# Patient Record
Sex: Female | Born: 1935
Health system: Southern US, Community
[De-identification: ages and names within clinical notes are randomized; demographics above are authoritative.]

## PROBLEM LIST (undated history)

## (undated) DIAGNOSIS — J189 Pneumonia, unspecified organism: Secondary | ICD-10-CM

## (undated) DIAGNOSIS — I495 Sick sinus syndrome: Secondary | ICD-10-CM

## (undated) DIAGNOSIS — R609 Edema, unspecified: Secondary | ICD-10-CM

## (undated) DIAGNOSIS — M1990 Unspecified osteoarthritis, unspecified site: Secondary | ICD-10-CM

## (undated) DIAGNOSIS — I82409 Acute embolism and thrombosis of unspecified deep veins of unspecified lower extremity: Secondary | ICD-10-CM

## (undated) DIAGNOSIS — Z950 Presence of cardiac pacemaker: Secondary | ICD-10-CM

## (undated) DIAGNOSIS — F329 Major depressive disorder, single episode, unspecified: Secondary | ICD-10-CM

## (undated) DIAGNOSIS — F32A Depression, unspecified: Secondary | ICD-10-CM

## (undated) DIAGNOSIS — I639 Cerebral infarction, unspecified: Secondary | ICD-10-CM

## (undated) DIAGNOSIS — I10 Essential (primary) hypertension: Secondary | ICD-10-CM

## (undated) DIAGNOSIS — I480 Paroxysmal atrial fibrillation: Secondary | ICD-10-CM

## (undated) DIAGNOSIS — I340 Nonrheumatic mitral (valve) insufficiency: Secondary | ICD-10-CM

## (undated) HISTORY — DX: Cerebral infarction, unspecified: I63.9

## (undated) HISTORY — DX: Depression, unspecified: F32.A

## (undated) HISTORY — DX: Sick sinus syndrome: I49.5

## (undated) HISTORY — DX: Major depressive disorder, single episode, unspecified: F32.9

## (undated) HISTORY — DX: Acute embolism and thrombosis of unspecified deep veins of unspecified lower extremity: I82.409

## (undated) HISTORY — DX: Essential (primary) hypertension: I10

## (undated) HISTORY — DX: Edema, unspecified: R60.9

## (undated) HISTORY — PX: TONSILLECTOMY: SUR1361

## (undated) HISTORY — DX: Paroxysmal atrial fibrillation: I48.0

---

## 1998-08-18 ENCOUNTER — Other Ambulatory Visit: Admission: RE | Admit: 1998-08-18 | Discharge: 1998-08-18 | Payer: Self-pay | Admitting: Gynecology

## 1999-08-23 ENCOUNTER — Other Ambulatory Visit: Admission: RE | Admit: 1999-08-23 | Discharge: 1999-08-23 | Payer: Self-pay | Admitting: Gynecology

## 2000-08-23 ENCOUNTER — Other Ambulatory Visit: Admission: RE | Admit: 2000-08-23 | Discharge: 2000-08-23 | Payer: Self-pay | Admitting: Gynecology

## 2001-09-12 ENCOUNTER — Other Ambulatory Visit: Admission: RE | Admit: 2001-09-12 | Discharge: 2001-09-12 | Payer: Self-pay | Admitting: Gynecology

## 2002-09-15 ENCOUNTER — Other Ambulatory Visit: Admission: RE | Admit: 2002-09-15 | Discharge: 2002-09-15 | Payer: Self-pay | Admitting: Gynecology

## 2004-09-21 ENCOUNTER — Ambulatory Visit (HOSPITAL_COMMUNITY): Admission: RE | Admit: 2004-09-21 | Discharge: 2004-09-21 | Payer: Self-pay | Admitting: Cardiology

## 2004-09-30 ENCOUNTER — Ambulatory Visit: Payer: Self-pay | Admitting: Pulmonary Disease

## 2004-10-12 ENCOUNTER — Other Ambulatory Visit: Admission: RE | Admit: 2004-10-12 | Discharge: 2004-10-12 | Payer: Self-pay | Admitting: Gynecology

## 2004-10-14 ENCOUNTER — Ambulatory Visit: Payer: Self-pay | Admitting: Pulmonary Disease

## 2004-11-02 ENCOUNTER — Ambulatory Visit: Payer: Self-pay | Admitting: Pulmonary Disease

## 2005-08-24 ENCOUNTER — Inpatient Hospital Stay (HOSPITAL_COMMUNITY): Admission: AD | Admit: 2005-08-24 | Discharge: 2005-08-29 | Payer: Self-pay | Admitting: Cardiology

## 2005-09-07 ENCOUNTER — Ambulatory Visit: Payer: Self-pay | Admitting: Internal Medicine

## 2005-09-12 ENCOUNTER — Ambulatory Visit (HOSPITAL_COMMUNITY): Admission: RE | Admit: 2005-09-12 | Discharge: 2005-09-12 | Payer: Self-pay | Admitting: Internal Medicine

## 2005-09-14 ENCOUNTER — Ambulatory Visit (HOSPITAL_COMMUNITY): Admission: RE | Admit: 2005-09-14 | Discharge: 2005-09-15 | Payer: Self-pay | Admitting: Internal Medicine

## 2005-09-14 ENCOUNTER — Ambulatory Visit: Payer: Self-pay | Admitting: Internal Medicine

## 2005-09-14 HISTORY — PX: PACEMAKER INSERTION: SHX728

## 2005-09-18 ENCOUNTER — Ambulatory Visit: Payer: Self-pay

## 2005-10-02 ENCOUNTER — Ambulatory Visit: Payer: Self-pay

## 2005-10-25 ENCOUNTER — Ambulatory Visit: Payer: Self-pay | Admitting: Cardiology

## 2006-01-09 ENCOUNTER — Ambulatory Visit: Payer: Self-pay | Admitting: Internal Medicine

## 2006-01-17 ENCOUNTER — Ambulatory Visit: Payer: Self-pay | Admitting: Internal Medicine

## 2006-05-18 ENCOUNTER — Ambulatory Visit: Payer: Self-pay | Admitting: Cardiovascular Disease

## 2006-05-25 ENCOUNTER — Ambulatory Visit: Payer: Self-pay | Admitting: Internal Medicine

## 2006-05-29 ENCOUNTER — Ambulatory Visit: Payer: Self-pay

## 2006-05-29 ENCOUNTER — Ambulatory Visit: Payer: Self-pay | Admitting: Internal Medicine

## 2006-06-08 ENCOUNTER — Ambulatory Visit: Payer: Self-pay | Admitting: Cardiology

## 2006-06-22 ENCOUNTER — Ambulatory Visit: Payer: Self-pay | Admitting: Cardiology

## 2006-07-06 ENCOUNTER — Encounter: Admission: RE | Admit: 2006-07-06 | Discharge: 2006-07-06 | Payer: Self-pay | Admitting: Internal Medicine

## 2006-07-09 ENCOUNTER — Ambulatory Visit: Payer: Self-pay | Admitting: Cardiovascular Disease

## 2006-08-01 ENCOUNTER — Ambulatory Visit: Payer: Self-pay | Admitting: *Deleted

## 2006-09-03 ENCOUNTER — Ambulatory Visit: Payer: Self-pay | Admitting: Cardiovascular Disease

## 2006-09-17 ENCOUNTER — Ambulatory Visit: Payer: Self-pay | Admitting: Cardiovascular Disease

## 2006-10-08 ENCOUNTER — Ambulatory Visit: Payer: Self-pay | Admitting: Cardiovascular Disease

## 2006-10-30 ENCOUNTER — Other Ambulatory Visit: Admission: RE | Admit: 2006-10-30 | Discharge: 2006-10-30 | Payer: Self-pay | Admitting: Gynecology

## 2006-11-01 ENCOUNTER — Ambulatory Visit: Payer: Self-pay | Admitting: Cardiovascular Disease

## 2006-11-29 ENCOUNTER — Ambulatory Visit: Payer: Self-pay | Admitting: Cardiology

## 2006-12-28 ENCOUNTER — Ambulatory Visit: Payer: Self-pay | Admitting: Internal Medicine

## 2007-01-23 ENCOUNTER — Ambulatory Visit: Payer: Self-pay | Admitting: Internal Medicine

## 2007-02-20 ENCOUNTER — Ambulatory Visit: Payer: Self-pay | Admitting: Cardiovascular Disease

## 2007-03-21 ENCOUNTER — Ambulatory Visit: Payer: Self-pay | Admitting: Cardiology

## 2007-04-03 ENCOUNTER — Ambulatory Visit: Payer: Self-pay | Admitting: *Deleted

## 2007-04-17 ENCOUNTER — Ambulatory Visit: Payer: Self-pay | Admitting: Cardiology

## 2007-05-01 ENCOUNTER — Ambulatory Visit: Payer: Self-pay | Admitting: Cardiology

## 2007-05-07 ENCOUNTER — Ambulatory Visit: Payer: Self-pay | Admitting: Cardiovascular Disease

## 2007-05-22 ENCOUNTER — Ambulatory Visit: Payer: Self-pay | Admitting: Internal Medicine

## 2007-05-22 ENCOUNTER — Ambulatory Visit: Payer: Self-pay

## 2007-06-19 ENCOUNTER — Ambulatory Visit: Payer: Self-pay | Admitting: Cardiology

## 2007-07-17 ENCOUNTER — Ambulatory Visit: Payer: Self-pay | Admitting: Cardiology

## 2007-08-07 ENCOUNTER — Ambulatory Visit: Payer: Self-pay | Admitting: Cardiology

## 2007-08-21 ENCOUNTER — Ambulatory Visit: Payer: Self-pay | Admitting: Internal Medicine

## 2007-09-04 ENCOUNTER — Ambulatory Visit: Payer: Self-pay | Admitting: Internal Medicine

## 2007-09-13 ENCOUNTER — Ambulatory Visit: Payer: Self-pay | Admitting: Cardiology

## 2007-09-13 ENCOUNTER — Ambulatory Visit: Payer: Self-pay

## 2007-10-02 ENCOUNTER — Ambulatory Visit: Payer: Self-pay | Admitting: Cardiology

## 2007-10-30 ENCOUNTER — Ambulatory Visit: Payer: Self-pay | Admitting: Cardiology

## 2007-11-13 ENCOUNTER — Ambulatory Visit: Payer: Self-pay | Admitting: Cardiovascular Disease

## 2007-11-18 ENCOUNTER — Ambulatory Visit: Payer: Self-pay | Admitting: Cardiology

## 2007-12-09 ENCOUNTER — Ambulatory Visit: Payer: Self-pay | Admitting: Cardiology

## 2008-01-06 ENCOUNTER — Ambulatory Visit: Payer: Self-pay | Admitting: Internal Medicine

## 2008-01-20 ENCOUNTER — Ambulatory Visit: Payer: Self-pay | Admitting: Cardiology

## 2008-01-31 ENCOUNTER — Ambulatory Visit: Payer: Self-pay | Admitting: Cardiovascular Disease

## 2008-02-11 ENCOUNTER — Ambulatory Visit: Payer: Self-pay

## 2008-02-11 ENCOUNTER — Encounter: Payer: Self-pay | Admitting: Cardiovascular Disease

## 2008-02-17 ENCOUNTER — Ambulatory Visit: Payer: Self-pay | Admitting: Cardiology

## 2008-02-26 ENCOUNTER — Ambulatory Visit: Payer: Self-pay | Admitting: Internal Medicine

## 2008-03-23 ENCOUNTER — Ambulatory Visit: Payer: Self-pay | Admitting: Cardiovascular Disease

## 2008-04-03 ENCOUNTER — Ambulatory Visit: Payer: Self-pay | Admitting: Cardiology

## 2008-04-21 ENCOUNTER — Ambulatory Visit: Payer: Self-pay | Admitting: Cardiology

## 2008-05-04 ENCOUNTER — Ambulatory Visit: Payer: Self-pay | Admitting: Cardiology

## 2008-05-18 ENCOUNTER — Ambulatory Visit: Payer: Self-pay | Admitting: Internal Medicine

## 2008-06-01 ENCOUNTER — Ambulatory Visit: Payer: Self-pay | Admitting: Internal Medicine

## 2008-06-22 ENCOUNTER — Ambulatory Visit: Payer: Self-pay | Admitting: Cardiovascular Disease

## 2008-07-13 ENCOUNTER — Ambulatory Visit: Payer: Self-pay | Admitting: Cardiology

## 2008-08-10 ENCOUNTER — Ambulatory Visit: Payer: Self-pay | Admitting: Cardiology

## 2008-08-19 ENCOUNTER — Encounter: Payer: Self-pay | Admitting: Cardiovascular Disease

## 2008-08-19 ENCOUNTER — Ambulatory Visit: Payer: Self-pay

## 2008-08-24 ENCOUNTER — Ambulatory Visit: Payer: Self-pay | Admitting: Cardiovascular Disease

## 2008-08-31 ENCOUNTER — Ambulatory Visit: Payer: Self-pay

## 2008-09-07 ENCOUNTER — Ambulatory Visit: Payer: Self-pay | Admitting: Cardiology

## 2008-10-05 ENCOUNTER — Ambulatory Visit: Payer: Self-pay | Admitting: Cardiology

## 2008-11-02 ENCOUNTER — Ambulatory Visit: Payer: Self-pay | Admitting: Cardiovascular Disease

## 2008-11-30 ENCOUNTER — Ambulatory Visit: Payer: Self-pay | Admitting: Internal Medicine

## 2008-12-28 ENCOUNTER — Ambulatory Visit: Payer: Self-pay | Admitting: Cardiology

## 2009-01-25 ENCOUNTER — Ambulatory Visit: Payer: Self-pay | Admitting: Cardiology

## 2009-02-17 ENCOUNTER — Ambulatory Visit: Payer: Self-pay | Admitting: Cardiovascular Disease

## 2009-02-17 DIAGNOSIS — I080 Rheumatic disorders of both mitral and aortic valves: Secondary | ICD-10-CM

## 2009-02-17 DIAGNOSIS — I10 Essential (primary) hypertension: Secondary | ICD-10-CM | POA: Insufficient documentation

## 2009-02-17 DIAGNOSIS — R609 Edema, unspecified: Secondary | ICD-10-CM

## 2009-02-17 DIAGNOSIS — I4891 Unspecified atrial fibrillation: Secondary | ICD-10-CM | POA: Insufficient documentation

## 2009-02-17 DIAGNOSIS — I4819 Other persistent atrial fibrillation: Secondary | ICD-10-CM | POA: Insufficient documentation

## 2009-02-17 DIAGNOSIS — I82409 Acute embolism and thrombosis of unspecified deep veins of unspecified lower extremity: Secondary | ICD-10-CM | POA: Insufficient documentation

## 2009-02-17 DIAGNOSIS — I495 Sick sinus syndrome: Secondary | ICD-10-CM | POA: Insufficient documentation

## 2009-02-22 ENCOUNTER — Ambulatory Visit: Payer: Self-pay | Admitting: Cardiology

## 2009-03-05 ENCOUNTER — Encounter (INDEPENDENT_AMBULATORY_CARE_PROVIDER_SITE_OTHER): Payer: Self-pay

## 2009-03-08 ENCOUNTER — Ambulatory Visit: Payer: Self-pay | Admitting: Internal Medicine

## 2009-03-18 ENCOUNTER — Ambulatory Visit: Payer: Self-pay | Admitting: Internal Medicine

## 2009-04-01 ENCOUNTER — Ambulatory Visit: Payer: Self-pay | Admitting: Internal Medicine

## 2009-04-27 ENCOUNTER — Encounter: Payer: Self-pay | Admitting: *Deleted

## 2009-04-29 ENCOUNTER — Ambulatory Visit: Payer: Self-pay | Admitting: Internal Medicine

## 2009-04-29 ENCOUNTER — Encounter (INDEPENDENT_AMBULATORY_CARE_PROVIDER_SITE_OTHER): Payer: Self-pay | Admitting: Cardiology

## 2009-04-29 LAB — CONVERTED CEMR LAB
POC INR: 3.4
Protime: 22.2

## 2009-05-24 ENCOUNTER — Ambulatory Visit: Payer: Self-pay | Admitting: Cardiology

## 2009-05-24 LAB — CONVERTED CEMR LAB
POC INR: 2.1
Prothrombin Time: 17.8 s

## 2009-06-02 ENCOUNTER — Encounter: Payer: Self-pay | Admitting: *Deleted

## 2009-06-22 ENCOUNTER — Ambulatory Visit: Payer: Self-pay | Admitting: Internal Medicine

## 2009-06-22 LAB — CONVERTED CEMR LAB
POC INR: 3.6
Prothrombin Time: 22.9 s

## 2009-07-06 ENCOUNTER — Ambulatory Visit: Payer: Self-pay | Admitting: Internal Medicine

## 2009-07-06 LAB — CONVERTED CEMR LAB
POC INR: 2.4
Prothrombin Time: 19 s

## 2009-07-28 ENCOUNTER — Ambulatory Visit: Payer: Self-pay | Admitting: Internal Medicine

## 2009-07-28 LAB — CONVERTED CEMR LAB: POC INR: 2.7

## 2009-08-12 ENCOUNTER — Ambulatory Visit: Payer: Self-pay | Admitting: Internal Medicine

## 2009-08-12 DIAGNOSIS — Z950 Presence of cardiac pacemaker: Secondary | ICD-10-CM | POA: Insufficient documentation

## 2009-09-09 ENCOUNTER — Telehealth: Payer: Self-pay | Admitting: Cardiology

## 2009-09-10 ENCOUNTER — Ambulatory Visit: Payer: Self-pay | Admitting: Internal Medicine

## 2009-09-10 LAB — CONVERTED CEMR LAB: POC INR: 1.9

## 2009-09-24 ENCOUNTER — Ambulatory Visit: Payer: Self-pay | Admitting: Internal Medicine

## 2009-09-24 LAB — CONVERTED CEMR LAB: POC INR: 2.2

## 2009-10-19 ENCOUNTER — Ambulatory Visit: Payer: Self-pay | Admitting: Cardiovascular Disease

## 2009-10-19 LAB — CONVERTED CEMR LAB: POC INR: 2.7

## 2009-11-16 ENCOUNTER — Ambulatory Visit: Payer: Self-pay | Admitting: Internal Medicine

## 2009-11-16 LAB — CONVERTED CEMR LAB: POC INR: 2.6

## 2009-11-30 ENCOUNTER — Encounter: Payer: Self-pay | Admitting: Cardiovascular Disease

## 2009-12-01 ENCOUNTER — Encounter: Payer: Self-pay | Admitting: Cardiovascular Disease

## 2009-12-08 ENCOUNTER — Encounter: Payer: Self-pay | Admitting: Cardiovascular Disease

## 2009-12-14 ENCOUNTER — Ambulatory Visit: Payer: Self-pay | Admitting: Cardiology

## 2009-12-14 LAB — CONVERTED CEMR LAB: POC INR: 2

## 2009-12-22 ENCOUNTER — Encounter (INDEPENDENT_AMBULATORY_CARE_PROVIDER_SITE_OTHER): Payer: Self-pay | Admitting: *Deleted

## 2010-01-11 ENCOUNTER — Ambulatory Visit: Payer: Self-pay | Admitting: Internal Medicine

## 2010-01-11 LAB — CONVERTED CEMR LAB: POC INR: 2.7

## 2010-02-08 ENCOUNTER — Ambulatory Visit: Payer: Self-pay | Admitting: Cardiology

## 2010-02-08 LAB — CONVERTED CEMR LAB: POC INR: 2.5

## 2010-02-15 ENCOUNTER — Encounter: Payer: Self-pay | Admitting: Internal Medicine

## 2010-02-15 ENCOUNTER — Ambulatory Visit: Payer: Self-pay | Admitting: Cardiovascular Disease

## 2010-02-15 DIAGNOSIS — E782 Mixed hyperlipidemia: Secondary | ICD-10-CM

## 2010-03-08 ENCOUNTER — Ambulatory Visit: Payer: Self-pay | Admitting: Cardiovascular Disease

## 2010-03-16 ENCOUNTER — Ambulatory Visit: Payer: Self-pay | Admitting: Cardiology

## 2010-03-16 LAB — CONVERTED CEMR LAB: POC INR: 2.5

## 2010-04-12 ENCOUNTER — Ambulatory Visit: Payer: Self-pay | Admitting: Internal Medicine

## 2010-04-12 LAB — CONVERTED CEMR LAB: POC INR: 2.5

## 2010-05-10 ENCOUNTER — Ambulatory Visit: Payer: Self-pay | Admitting: Cardiovascular Disease

## 2010-05-10 LAB — CONVERTED CEMR LAB: POC INR: 3.2

## 2010-06-07 ENCOUNTER — Ambulatory Visit: Payer: Self-pay | Admitting: Cardiovascular Disease

## 2010-06-07 LAB — CONVERTED CEMR LAB: POC INR: 3.1

## 2010-07-05 ENCOUNTER — Ambulatory Visit: Payer: Self-pay | Admitting: Cardiology

## 2010-07-05 LAB — CONVERTED CEMR LAB: POC INR: 3.1

## 2010-07-26 ENCOUNTER — Ambulatory Visit: Payer: Self-pay | Admitting: Cardiovascular Disease

## 2010-07-26 LAB — CONVERTED CEMR LAB: POC INR: 2.4

## 2010-08-16 ENCOUNTER — Ambulatory Visit: Payer: Self-pay | Admitting: Internal Medicine

## 2010-08-23 ENCOUNTER — Ambulatory Visit: Payer: Self-pay | Admitting: Cardiovascular Disease

## 2010-08-23 LAB — CONVERTED CEMR LAB: POC INR: 2.3

## 2010-08-26 ENCOUNTER — Telehealth: Payer: Self-pay | Admitting: Cardiovascular Disease

## 2010-08-26 ENCOUNTER — Encounter: Payer: Self-pay | Admitting: Cardiovascular Disease

## 2010-08-26 DIAGNOSIS — S0990XA Unspecified injury of head, initial encounter: Secondary | ICD-10-CM | POA: Insufficient documentation

## 2010-08-29 ENCOUNTER — Ambulatory Visit: Payer: Self-pay | Admitting: Internal Medicine

## 2010-09-20 ENCOUNTER — Ambulatory Visit: Payer: Self-pay | Admitting: Cardiology

## 2010-09-20 LAB — CONVERTED CEMR LAB: POC INR: 4

## 2010-10-04 ENCOUNTER — Ambulatory Visit: Payer: Self-pay | Admitting: Cardiovascular Disease

## 2010-10-04 ENCOUNTER — Ambulatory Visit: Payer: Self-pay | Admitting: Internal Medicine

## 2010-10-04 LAB — CONVERTED CEMR LAB
ALT: 25 units/L (ref 0–35)
AST: 24 units/L (ref 0–37)
Albumin: 3.9 g/dL (ref 3.5–5.2)
Alkaline Phosphatase: 40 units/L (ref 39–117)
Bilirubin, Direct: 0.1 mg/dL (ref 0.0–0.3)
Cholesterol: 191 mg/dL (ref 0–200)
HDL: 64.9 mg/dL (ref >39.00)
LDL Cholesterol: 104 mg/dL — ABNORMAL HIGH (ref 0–99)
POC INR: 2.3
Total Bilirubin: 0.7 mg/dL (ref 0.3–1.2)
Total CHOL/HDL Ratio: 3
Total Protein: 6 g/dL (ref 6.0–8.3)
Triglycerides: 109 mg/dL (ref 0.0–149.0)
VLDL: 21.8 mg/dL (ref 0.0–40.0)

## 2010-10-12 ENCOUNTER — Telehealth: Payer: Self-pay | Admitting: Cardiovascular Disease

## 2010-11-01 ENCOUNTER — Ambulatory Visit: Payer: Self-pay | Admitting: Cardiovascular Disease

## 2010-11-01 LAB — CONVERTED CEMR LAB: POC INR: 2.1

## 2010-12-02 ENCOUNTER — Ambulatory Visit: Admission: RE | Admit: 2010-12-02 | Discharge: 2010-12-02 | Payer: Self-pay | Source: Home / Self Care

## 2010-12-02 LAB — CONVERTED CEMR LAB: POC INR: 2

## 2010-12-25 LAB — CONVERTED CEMR LAB: Hgb A1c MFr Bld: 5.7 % (ref 4.6–6.5)

## 2010-12-29 NOTE — Letter (Signed)
Summary: Appointment - Reminder [place], [place]  [address] [place]   [place], [place] [address]   Phone: [phone]  Fax: [phone]     December 22, 2009 MRN: [id number]   [facility] [address] [place], [place]  [address]   Dear Ms. Shenberger,  Our records indicate that it is time to schedule a follow-up appointment with Dr. Johnsie Cancel. It is very important that we reach you to schedule this appointment. We look forward to participating in your health care needs. Please contact us at the number listed above at your earliest convenience to schedule your appointment.  If you are unable to make an appointment at this time, give [place] a call so we can update our records.   Sincerely,   Darnell Level [facility] Scheduling Team

## 2010-12-29 NOTE — Cardiovascular Report (Signed)
Summary: Office Visit   Office Visit   Imported By: Sallee Provencal 08/23/2010 10:47:29  _____________________________________________________________________  External Attachment:    Type:   Image     Comment:   External Document

## 2010-12-29 NOTE — Medication Information (Signed)
Summary: rov/tm  Anticoagulant Therapy  Managed by: Gwynneth Albright, PharmD Referring MD: Virl Axe MD Supervising MD: Burt Knack MD, Legrand Como Indication 1: Atrial Fibrillation (ICD-427.31) Lab Used: [place] Site: [facility] INR RANGE 2 - 3  Dietary changes: yes       Details: Pt has had company so has been eating less greens.  Health status changes: no    Bleeding/hemorrhagic complications: no    Recent/future hospitalizations: no    Any changes in medication regimen? yes       Details: Started Atelvia (risedronate). Pt supposed to start Crestor 2.5 mg HS.  Recent/future dental: no  Any missed doses?: no       Is patient compliant with meds? yes       Allergies: 1)  ! Sporanox (Itraconazole)  Anticoagulation Management History:      The patient is taking warfarin and comes in today for a routine follow up visit.  Positive risk factors for bleeding include an age of 75 years or older.  The bleeding index is 'intermediate risk'.  Positive CHADS2 values include History of HTN and Age > 57 years old.  The start date was 05/23/2006.  Anticoagulation responsible provider: Burt Knack MD, Legrand Como.  Cuvette Lot#: [id number].  Exp: 02/2011.    Anticoagulation Management Assessment/Plan:      The patient's current anticoagulation dose is Coumadin 5 mg tabs: Use as directed by anticoagulation clinic.  The target INR is 2.0-3.0.  The next INR is due 03/16/2010.  Anticoagulation instructions were given to patient.  Results were reviewed/authorized by Gwynneth Albright, PharmD.  She was notified by Gwynneth Albright, PharmD.         Prior Anticoagulation Instructions: INR 2.5 Continue 5mg s everyday excpet 2.5mg s on Mondays, Wednesdays and Fridays. Recheck in 4 weeks.   Current Anticoagulation Instructions: INR 4.6. Hold today and tomorrow, then take 1 tablet daily except 0.5 tablet Mon, Wed, Fri. Recheck in 7-10 days.

## 2010-12-29 NOTE — Medication Information (Signed)
Summary: rov/cb  Anticoagulant Therapy  Managed by: Gwynneth Albright, PharmD Referring MD: Virl Axe MD Supervising MD: Aundra Dubin MD, Vencent Hauschild Indication 1: Atrial Fibrillation (ICD-427.31) Lab Used: [place] Site: [facility] INR POC 2.5 INR RANGE 2 - 3  Dietary changes: no    Health status changes: yes       Details: Pt has boil in groin area.  Seeing MD today to have this lanced.  Will call if start antibiotics.  Bleeding/hemorrhagic complications: no    Recent/future hospitalizations: no    Any changes in medication regimen? no    Recent/future dental: no  Any missed doses?: no       Is patient compliant with meds? yes      Comments: Pt took double doses of Coumadin last time for 2 days due to misfilling pill boxes.  Allergies: 1)  ! Sporanox (Itraconazole)  Anticoagulation Management History:      The patient is taking warfarin and comes in today for a routine follow up visit.  Positive risk factors for bleeding include an age of 75 years or older.  The bleeding index is 'intermediate risk'.  Positive CHADS2 values include History of HTN and Age > 29 years old.  The start date was 05/23/2006.  Anticoagulation responsible provider: Aundra Dubin MD, Jacqulyn Barresi.  INR POC: 2.5.  Cuvette Lot#: [id number].  Exp: 03/2011.    Anticoagulation Management Assessment/Plan:      The patient's current anticoagulation dose is Coumadin 5 mg tabs: Use as directed by anticoagulation clinic.  The target INR is 2.0-3.0.  The next INR is due 04/12/2010.  Anticoagulation instructions were given to patient.  Results were reviewed/authorized by Gwynneth Albright, PharmD.  She was notified by Gwynneth Albright, PharmD.         Prior Anticoagulation Instructions: INR 4.6. Hold today and tomorrow, then take 1 tablet daily except 0.5 tablet Mon, Wed, Fri. Recheck in 7-10 days.  Current Anticoagulation Instructions: INR 2.5. Take 1 tablet daily except 0.5 tablet Mon, Wed, Fri.  Appended Document: rov/cb Pt called  to let us know what antibiotic she had started.  She is taking doxycycline 100mg .  Informed pt this does not usually affect INR.  Okay to keep same dose and original appt.

## 2010-12-29 NOTE — Medication Information (Signed)
Summary: rov/tm  Anticoagulant Therapy  Managed by: Belenda Cruise, PharmD Candidate Referring MD: Virl Axe MD Supervising MD: Ron Parker MD, Dellis Filbert Indication 1: Atrial Fibrillation (ICD-427.31) Lab Used: [place] Site: [facility] INR POC 2.0 INR RANGE 2 - 3  Dietary changes: no    Health status changes: no    Bleeding/hemorrhagic complications: no    Recent/future hospitalizations: no    Any changes in medication regimen? no    Recent/future dental: no  Any missed doses?: no       Is patient compliant with meds? yes       Allergies: 1)  ! Sporanox (Itraconazole)  Anticoagulation Management History:      The patient is taking warfarin and comes in today for a routine follow up visit.  Positive risk factors for bleeding include an age of 7 years or older.  The bleeding index is 'intermediate risk'.  Positive CHADS2 values include History of HTN and Age > 26 years old.  The start date was 05/23/2006.  Anticoagulation responsible provider: Ron Parker MD, Dellis Filbert.  INR POC: 2.0.  Cuvette Lot#: [id number].  Exp: 02/2011.    Anticoagulation Management Assessment/Plan:      The patient's current anticoagulation dose is Coumadin 5 mg tabs: Use as directed by anticoagulation clinic.  The target INR is 2.0-3.0.  The next INR is due 01/11/2010.  Anticoagulation instructions were given to patient.  Results were reviewed/authorized by Belenda Cruise, PharmD Candidate.  She was notified by Belenda Cruise, PharmD Candidate.         Prior Anticoagulation Instructions: INR 2.6 Continue 5mg s daily except 2.5mg s on Mondays, Wednesdays and Fridays.  Recheck in 4 weeks.   Current Anticoagulation Instructions: INR 2.0  Continue same dose of 1 tablet daily except 0.5 tablet on Mondays, Wednesdays, and Fridays. Recheck in 4 weeks.

## 2010-12-29 NOTE — Medication Information (Signed)
Summary: rov/ln      Allergies Added:  Anticoagulant Therapy  Managed by: Gypsy Lore, PharmD Referring MD: Virl Axe MD Supervising MD: Verl Blalock MD, Marcello Moores Indication 1: Atrial Fibrillation (ICD-427.31) Lab Used: [facility] Site: [facility] INR POC 3.1 INR RANGE 2 - 3  Dietary changes: no    Health status changes: no    Bleeding/hemorrhagic complications: no    Recent/future hospitalizations: no    Any changes in medication regimen? no    Recent/future dental: no  Any missed doses?: no       Is patient compliant with meds? yes      Comments: Patient hit head last week. Has been dizzy and complains of nauseathis week and has a bruise on her face. Denies vision changes other than "floaters."  BP and HR are normal.  she does report extra stress over the past few months.  She has recently moved and had a close friend die.  She has been working a lot around her house and admits to not staying hydrated.  Suggested she see her opthomologist reguarding vision changes.   Current Medications (verified): 1)  Citalopram Hydrobromide 20 Mg Tabs (Citalopram Hydrobromide) .Marland Kitchen.. 1 Tab By Mouth Once Daily 2)  Propafenone Hcl 225 Mg Tabs (Propafenone Hcl) .Marland Kitchen.. 1 Tab By Mouth Two Times A Day 3)  Lorazepam 0.5 Mg Tabs (Lorazepam) .Marland Kitchen.. 1 Tab As Needed 4)  Vitamin D 1000 Unit  Tabs (Cholecalciferol) .... 2 Tab By Mouth Once Daily 5)  Calcim/mag .Marland Kitchen.. 1 Tab By Mouth Once Daily 6)  Folic Acid   (Folic Acid) .Marland Kitchen.. 1 Tab By Mouth Once Daily 7)  Vitamin C .... 1 Tab By Mouth Two Times A Day 8)  Vitamin B12 .Marland Kitchen.. 1 Tab By Mouth Once Daily 9)  Biotin .Marland Kitchen.. 1 Tab As Needed 10)  Coumadin 5 Mg Tabs (Warfarin Sodium) .... Use As Directed By Anticoagulation Clinic 11)  Diltiazem Hcl 30 Mg Tabs (Diltiazem Hcl) .Marland Kitchen.. 1 To 12 By Mouth Every 6 Hours As Needed 12)  Atelvia 35 Mg Tbec (Risedronate Sodium) .Marland Kitchen.. 1 Tablet By Mouth Weekly  Allergies (verified): 1)  ! Sporanox (Itraconazole)  Anticoagulation  Management History:      The patient is taking warfarin and comes in today for a routine follow up visit.  Positive risk factors for bleeding include an age of 75 years or older.  The bleeding index is 'intermediate risk'.  Positive CHADS2 values include History of HTN and Age > 37 years old.  The start date was 05/23/2006.  Anticoagulation responsible provider: Verl Blalock MD, Marcello Moores.  INR POC: 3.1.  Cuvette Lot#: [id number].  Exp: 07/2011.    Anticoagulation Management Assessment/Plan:      The patient's current anticoagulation dose is Coumadin 5 mg tabs: Use as directed by anticoagulation clinic.  The target INR is 2.0-3.0.  The next INR is due 07/26/2010.  Anticoagulation instructions were given to patient.  Results were reviewed/authorized by Gypsy Lore, PharmD.  She was notified by Gypsy Lore PharmD.         Prior Anticoagulation Instructions: INR 3.1  Skip today's dose and then resume 1 tab (5mg ) on Sunday, Tuesday, Thursday, and Saturday and 0.5 tab (2.5mg ) on Monday, Wednesday, and Friday.  Re-check in 4 weeks.    Current Anticoagulation Instructions: INR 3.1  Take Coumadin 0.5 tab (2.5 mg) tonight.  Then take Coumadin 0.5 tab (2.5 mg) on Mon, Tues, Wed, Fri and Coumadin 1 tab (5 mg) on Sun, Thur, Sat. Return to  clinic in 2 weeks.

## 2010-12-29 NOTE — Medication Information (Signed)
Summary: rov/sp  Anticoagulant Therapy  Managed by: Porfirio Oar, PharmD Referring MD: Virl Axe MD PCP: [name], MD Supervising MD: Harrington Challenger MD, Nevin Bloodgood Indication 1: Atrial Fibrillation (ICD-427.31) Lab Used: [facility] Site: [facility] INR POC 2.0 INR RANGE 2 - 3  Dietary changes: no    Health status changes: no    Bleeding/hemorrhagic complications: no    Recent/future hospitalizations: no    Any changes in medication regimen? yes       Details: Taking an antibiotic(not sure of name) but prescriber knew she was on Coumadin  Recent/future dental: no  Any missed doses?: no       Is patient compliant with meds? yes       Allergies: 1)  ! Sporanox (Itraconazole)  Anticoagulation Management History:      The patient is taking warfarin and comes in today for a routine follow up visit.  Positive risk factors for bleeding include an age of 75 years or older.  The bleeding index is 'intermediate risk'.  Positive CHADS2 values include History of HTN and Age > 75 years old.  The start date was 05/23/2006.  Anticoagulation responsible provider: Harrington Challenger MD, Nevin Bloodgood.  INR POC: 2.0.  Exp: 08/2011.    Anticoagulation Management Assessment/Plan:      The patient's current anticoagulation dose is Coumadin 5 mg tabs: Use as directed by anticoagulation clinic.  The target INR is 2.0-3.0.  The next INR is due 01/03/2011.  Anticoagulation instructions were given to patient.  Results were reviewed/authorized by Porfirio Oar, PharmD.  She was notified by Ernst Bowler, PharmD candidate.         Prior Anticoagulation Instructions: INR 2.1  Continue same dose of 1/2 tablet every day except 1 tablet on Sunday and Thursday.  Recheck INR in 4 weeks.   Current Anticoagulation Instructions: INR 2.0 (INR goal: 2-3)  Take 1 tablet on Sundays, Thursdays, and Saturdays and 1/2 tablet on Mondays, Tuesdays, Wednesdays, and Fridays.

## 2010-12-29 NOTE — Assessment & Plan Note (Signed)
Summary: PC2/DM  Medications Added VITAMIN D3 2000 UNIT CAPS (CHOLECALCIFEROL) once daily DILTIAZEM HCL 30 MG TABS (DILTIAZEM HCL) 1 to 2 by mouth every 6 hours as needed        Visit Type:  Follow-up Primary Provider:  [name], MD   History of Present Illness: [name] [name] seen in followup for paroxysmal atrial fibrillation for which she takes Rythmol and bradycardia for which she is status post pacemaker implantation.   She is on relatively well notwithstanding a great deal of stress related to her moving.  She discussed with Dr. Johnsie Cancel  the role of Pradaxa. His recommendations were not so strongly in favor    Current Medications (verified): 1)  Citalopram Hydrobromide 20 Mg Tabs (Citalopram Hydrobromide) .Marland Kitchen.. 1 Tab By Mouth Once Daily 2)  Propafenone Hcl 225 Mg Tabs (Propafenone Hcl) .Marland Kitchen.. 1 Tab By Mouth Two Times A Day 3)  Lorazepam 0.5 Mg Tabs (Lorazepam) .Marland Kitchen.. 1 Tab As Needed 4)  Vitamin D3 2000 Unit Caps (Cholecalciferol) .... Once Daily 5)  Calcim/mag .Marland Kitchen.. 1 Tab By Mouth Once Daily 6)  Folic Acid   (Folic Acid) .Marland Kitchen.. 1 Tab By Mouth Once Daily 7)  Vitamin C .... 1 Tab By Mouth Two Times A Day 8)  Vitamin B12 .Marland Kitchen.. 1 Tab By Mouth Once Daily 9)  Biotin .Marland Kitchen.. 1 Tab As Needed 10)  Coumadin 5 Mg Tabs (Warfarin Sodium) .... Use As Directed By Anticoagulation Clinic 11)  Diltiazem Hcl 30 Mg Tabs (Diltiazem Hcl) .Marland Kitchen.. 1 To 2 By Mouth Every 6 Hours As Needed 12)  Atelvia 35 Mg Tbec (Risedronate Sodium) .Marland Kitchen.. 1 Tablet By Mouth Weekly  Allergies: 1)  ! Sporanox (Itraconazole)  Past History:  Past Medical History: Last updated: 02/17/2009 Current Problems:  DVT (ICD-453.40) SICK SINUS SYNDROME (ICD-427.81) MITRAL INSUFFICIENCY (ICD-396.3) EDEMA (ICD-782.3) PAROXYSMAL ATRIAL FIBRILLATION (ICD-427.31) HYPERTENSION (ICD-401.9) Elevated INR Valvular heart disease depression  Vital Signs:  Patient profile:   77 year old female Height:      66 inches Weight:      156  pounds BMI:     25.27 Pulse rate:   62 / minute BP sitting:   130 / 88  (left arm)  Vitals Entered By: Margaretmary Bayley CMA (August 16, 2010 11:10 AM)  Physical Exam  General:  The patient was alert and oriented in no acute distress. HEENT Normal.  Neck veins were flat, carotids were brisk.  Lungs were clear.  Heart sounds were regular without murmurs or gallops.  Abdomen was soft with active bowel sounds. There is no clubbing cyanosis or edema. Skin Warm and dry, was some venous discoloration changes    PPM Specifications Following MD:  Virl Axe, MD     Referring MD:  [facility] PPM Vendor:  [employer]     PPM Model Number:  [id number]     PPM Serial Number:  [id number] PPM DOI:  09/14/2005     PPM Implanting MD:  Virl Axe, MD  Lead 1    Location: [facility]     DOI: 09/14/2005     Model #: [id number]     Serial #: [id number]     Status: active Lead 2    Location: RV     DOI: 09/14/2005     Model #: [id number]     Serial #: [id number]     Status: active  Magnet Response Rate:  BOL 85 ERI 65  Indications:  Tachy-Brady Syndrome   PPM Follow Up Battery Voltage:  3.00 V  Pacer Dependent:  No       PPM Device Measurements Atrium  Amplitude: 2.1 mV, Impedance: 624 ohms, Threshold: 1.0 V at 0.4 msec Right Ventricle  Amplitude: 4.7 mV, Impedance: 408 ohms, Threshold: 1.0 V at 0.4 msec  Episodes MS Episodes:  38     Percent Mode Switch:  2.3%     Coumadin:  Yes Ventricular High Rate:  0     Atrial Pacing:  25.0%     Ventricular Pacing:  0.2%  Parameters Mode:  MVP     Lower Rate Limit:  60     Upper Rate Limit:  130 Paced AV Delay:  180     Sensed AV Delay:  150 Next Cardiology Appt Due:  01/30/2011 Tech Comments:  38 AF EPISODES--LONGEST WAS 23 HRS.  NORMAL DEVICE FUNCTION.  NO CHANGES MADE. ROV IN 6 MTHS W/DEVICE CLINIC. Shelly Bombard  August 16, 2010 11:29 AM  Impression & Recommendations:  Problem # 1:  PACEMAKER, PERMANENT (ICD-V45.01) Device parameters and data were reviewed and  no changes were made  Problem # 2:  PAROXYSMAL ATRIAL FIBRILLATION (ICD-427.31) She continues to have episodes of atrial fibrillation. We discussed the potential benefits of Pradaxa. I would be in favor of pursuing a trial of this medication. She will let us know. Her updated medication list for this problem includes:    Propafenone Hcl 225 Mg Tabs (Propafenone hcl) .Marland Kitchen... 1 tab by mouth two times a day    Coumadin 5 Mg Tabs (Warfarin sodium) ..... Use as directed by anticoagulation clinic  Problem # 3:  SICK SINUS SYNDROME (ICD-427.81) stable Her updated medication list for this problem includes:    Propafenone Hcl 225 Mg Tabs (Propafenone hcl) .Marland Kitchen... 1 tab by mouth two times a day    Coumadin 5 Mg Tabs (Warfarin sodium) ..... Use as directed by anticoagulation clinic    Diltiazem Hcl 30 Mg Tabs (Diltiazem hcl) .Marland Kitchen... 1 to 2 by mouth every 6 hours as needed

## 2010-12-29 NOTE — Letter (Signed)
Summary: Georga Kaufmann [place]   Imported By: Mingo Amber Bridgeforth 12/29/2009 14:12:10  _____________________________________________________________________  External Attachment:    Type:   Image     Comment:   External Document

## 2010-12-29 NOTE — Miscellaneous (Signed)
  Clinical Lists Changes  Problems: Added new problem of HEAD TRAUMA, CLOSED (ICD-959.01) Orders: Added new Referral order of CT Scan  (CT Scan) - Signed

## 2010-12-29 NOTE — Medication Information (Signed)
Summary: rov/cb  Anticoagulant Therapy  Managed by: Porfirio Oar, PharmD Referring MD: Virl Axe MD Supervising MD: Caryl Comes MD, Remo Lipps Indication 1: Atrial Fibrillation (ICD-427.31) Lab Used: [place] Site: [facility] INR POC 2.5 INR RANGE 2 - 3  Dietary changes: no    Health status changes: no    Bleeding/hemorrhagic complications: no    Recent/future hospitalizations: no    Any changes in medication regimen? no    Recent/future dental: no  Any missed doses?: no       Is patient compliant with meds? yes       Allergies: 1)  ! Sporanox (Itraconazole)  Anticoagulation Management History:      The patient is taking warfarin and comes in today for a routine follow up visit.  Positive risk factors for bleeding include an age of 75 years or older.  The bleeding index is 'intermediate risk'.  Positive CHADS2 values include History of HTN and Age > 52 years old.  The start date was 05/23/2006.  Anticoagulation responsible provider: Caryl Comes MD, Remo Lipps.  INR POC: 2.5.  Cuvette Lot#: [id number].  Exp: 06/2011.    Anticoagulation Management Assessment/Plan:      The patient's current anticoagulation dose is Coumadin 5 mg tabs: Use as directed by anticoagulation clinic.  The target INR is 2.0-3.0.  The next INR is due 05/10/2010.  Anticoagulation instructions were given to patient.  Results were reviewed/authorized by Porfirio Oar, PharmD.  She was notified by Porfirio Oar PharmD.         Prior Anticoagulation Instructions: INR 2.5. Take 1 tablet daily except 0.5 tablet Mon, Wed, Fri.  Current Anticoagulation Instructions: INR 2.5  Continue same dose of 1 tablet every day except 1/2 tablet on Monday, Wednesday and Friday

## 2010-12-29 NOTE — Medication Information (Signed)
Summary: rov/tm  Anticoagulant Therapy  Managed by: Porfirio Oar, PharmD Referring MD: Virl Axe MD Supervising MD: Angelena Form MD, Harrell Gave Indication 1: Atrial Fibrillation (ICD-427.31) Lab Used: [place] Site: [facility] INR POC 3.1 INR RANGE 2 - 3  Dietary changes: no    Health status changes: yes       Details: suspicious mole, encouraged pt to go to dermatologist to get it checked   Bleeding/hemorrhagic complications: yes       Details: hit left arm and has a large bruise  Recent/future hospitalizations: no    Any changes in medication regimen? no    Recent/future dental: no  Any missed doses?: yes     Details: may have missed one dose  Is patient compliant with meds? yes       Allergies: 1)  ! Sporanox (Itraconazole)  Anticoagulation Management History:      The patient is taking warfarin and comes in today for a routine follow up visit.  Positive risk factors for bleeding include an age of 75 years or older.  The bleeding index is 'intermediate risk'.  Positive CHADS2 values include History of HTN and Age > 75 years old.  The start date was 05/23/2006.  Anticoagulation responsible provider: Angelena Form MD, Harrell Gave.  INR POC: 3.1.  Cuvette Lot#: [id number].  Exp: 07/2011.    Anticoagulation Management Assessment/Plan:      The patient's current anticoagulation dose is Coumadin 5 mg tabs: Use as directed by anticoagulation clinic.  The target INR is 2.0-3.0.  The next INR is due 07/05/2010.  Anticoagulation instructions were given to patient.  Results were reviewed/authorized by Porfirio Oar, PharmD.  She was notified by Lind Covert.         Prior Anticoagulation Instructions: INR 3.2 Skip today's dose and then resume 5mg s everyday except 2.5mg s on Mondays, Wednesdays and Fridays. Recheck in 4 weeks.   Current Anticoagulation Instructions: INR 3.1  Skip today's dose and then resume 1 tab (5mg ) on Sunday, Tuesday, Thursday, and Saturday and 0.5 tab (2.5mg ) on  Monday, Wednesday, and Friday.  Re-check in 4 weeks.

## 2010-12-29 NOTE — Cardiovascular Report (Signed)
Summary: Office Visit   Office Visit   Imported By: Sallee Provencal 03/01/2010 15:49:16  _____________________________________________________________________  External Attachment:    Type:   Image     Comment:   External Document

## 2010-12-29 NOTE — Medication Information (Signed)
Summary: rov/jk   Anticoagulant Therapy  Managed by: Porfirio Oar, PharmD Referring MD: Virl Axe MD PCP: [name], MD Supervising MD: Johnsie Cancel MD, Collier Salina Indication 1: Atrial Fibrillation (ICD-427.31) Lab Used: [facility] Site: [facility] INR POC 2.3 INR RANGE 2 - 3  Dietary changes: no    Health status changes: no    Bleeding/hemorrhagic complications: no    Recent/future hospitalizations: no    Any changes in medication regimen? no    Recent/future dental: no  Any missed doses?: yes     Details: may have missed 1 dose a few weeks ago but unsure  Is patient compliant with meds? yes       Allergies: 1)  ! Sporanox (Itraconazole)  Anticoagulation Management History:      The patient is taking warfarin and comes in today for a routine follow up visit.  Positive risk factors for bleeding include an age of 75 years or older.  The bleeding index is 'intermediate risk'.  Positive CHADS2 values include History of HTN and Age > 56 years old.  The start date was 05/23/2006.  Anticoagulation responsible provider: Johnsie Cancel MD, Collier Salina.  INR POC: 2.3.  Exp: 07/2011.    Anticoagulation Management Assessment/Plan:      The patient's current anticoagulation dose is Coumadin 5 mg tabs: Use as directed by anticoagulation clinic.  The target INR is 2.0-3.0.  The next INR is due 09/20/2010.  Anticoagulation instructions were given to patient.  Results were reviewed/authorized by Porfirio Oar, PharmD.  She was notified by Porfirio Oar PharmD.         Prior Anticoagulation Instructions: INR 2.4  Continue taking 1/2 tablet (2.5mg ) every day except take 1 tablet (5mg ) on Sundays, Thursdays, and Saturdays.  Recheck in 4 weeks.   Current Anticoagulation Instructions: INR 2.3  Continue same dose of 1/2 tablet every day except 1 tablet on Thursday, Saturday and Sunday.  Recheck INR in 4 weeks.

## 2010-12-29 NOTE — Progress Notes (Signed)
Summary: pt out of town req refill asap in a -fib   Phone Note Refill Request Message from:  Patient on October 12, 2010 3:46 PM  Refills Requested: Medication #1:  DILTIAZEM HCL 30 MG TABS 1 to 2 by mouth every 6 hours as needed [phone] [place] pt out of town and in a-fib needs med asap   Method Requested: Telephone to Pharmacy Initial call taken by: Lorenda Hatchet,  October 12, 2010 3:46 PM  Follow-up for Phone Call        sent to pharmacy gave pt refills....called pt no answer so i left message Follow-up by: Burnett Kanaris,  October 12, 2010 4:18 PM

## 2010-12-29 NOTE — Medication Information (Signed)
Summary: rov/nb   Anticoagulant Therapy  Managed by: Porfirio Oar, PharmD Referring MD: Virl Axe MD PCP: [name], MD Supervising MD: Johnsie Cancel MD, Collier Salina Indication 1: Atrial Fibrillation (ICD-427.31) Lab Used: [facility] Site: [facility] INR POC 2.1 INR RANGE 2 - 3  Dietary changes: no    Health status changes: no    Bleeding/hemorrhagic complications: no    Recent/future hospitalizations: no    Any changes in medication regimen? no    Recent/future dental: no  Any missed doses?: no       Is patient compliant with meds? yes       Current Medications (verified): 1)  Citalopram Hydrobromide 20 Mg Tabs (Citalopram Hydrobromide) .Marland Kitchen.. 1 Tab By Mouth Once Daily 2)  Propafenone Hcl 225 Mg Tabs (Propafenone Hcl) .Marland Kitchen.. 1 Tab By Mouth Two Times A Day 3)  Lorazepam 0.5 Mg Tabs (Lorazepam) .Marland Kitchen.. 1 Tab As Needed 4)  Vitamin D3 2000 Unit Caps (Cholecalciferol) .... Once Daily 5)  Calcim/mag .Marland Kitchen.. 1 Tab By Mouth Once Daily 6)  Folic Acid   (Folic Acid) .Marland Kitchen.. 1 Tab By Mouth Once Daily 7)  Vitamin C .... 1 Tab By Mouth Two Times A Day 8)  Vitamin B12 .Marland Kitchen.. 1 Tab By Mouth Once Daily 9)  Biotin .Marland Kitchen.. 1 Tab As Needed 10)  Coumadin 5 Mg Tabs (Warfarin Sodium) .... Use As Directed By Anticoagulation Clinic 11)  Diltiazem Hcl 30 Mg Tabs (Diltiazem Hcl) .Marland Kitchen.. 1 To 2 By Mouth Every 6 Hours As Needed 12)  Atelvia 35 Mg Tbec (Risedronate Sodium) .Marland Kitchen.. 1 Tablet By Mouth Weekly  Allergies: 1)  ! Sporanox (Itraconazole)  Anticoagulation Management History:      The patient is taking warfarin and comes in today for a routine follow up visit.  Positive risk factors for bleeding include an age of 75 years or older.  The bleeding index is 'intermediate risk'.  Positive CHADS2 values include History of HTN and Age > 38 years old.  The start date was 05/23/2006.  Anticoagulation responsible provider: Johnsie Cancel MD, Collier Salina.  INR POC: 2.1.  Cuvette Lot#: [id number].  Exp: 08/2011.    Anticoagulation Management  Assessment/Plan:      The patient's current anticoagulation dose is Coumadin 5 mg tabs: Use as directed by anticoagulation clinic.  The target INR is 2.0-3.0.  The next INR is due 11/29/2010.  Anticoagulation instructions were given to patient.  Results were reviewed/authorized by Porfirio Oar, PharmD.  She was notified by Porfirio Oar PharmD.         Prior Anticoagulation Instructions: INR 2.3 Continue previous dose of 0.5 tablets everyday except 1 tablets on Sunday and Thursday. Recheck INR in 4 weeks.  Current Anticoagulation Instructions: INR 2.1  Continue same dose of 1/2 tablet every day except 1 tablet on Sunday and Thursday.  Recheck INR in 4 weeks.

## 2010-12-29 NOTE — Medication Information (Signed)
Summary: rov/sp  Anticoagulant Therapy  Managed by: Freddrick March, RN, BSN Referring MD: Virl Axe MD PCP: [name], MD Supervising MD: Aundra Dubin MD, Dalton Indication 1: Atrial Fibrillation (ICD-427.31) Lab Used: [facility] Site: [facility] INR POC 4.0 INR RANGE 2 - 3  Dietary changes: yes       Details: Decr vit K intake. More than 1 glass of wine last pm.   Health status changes: no    Bleeding/hemorrhagic complications: yes       Details: Blood from L nare when blows nose  Recent/future hospitalizations: no    Any changes in medication regimen? yes       Details: Added 2.5mg  Crestor daily  Recent/future dental: no  Any missed doses?: no       Is patient compliant with meds? yes       Allergies: 1)  ! Sporanox (Itraconazole)  Anticoagulation Management History:      The patient is taking warfarin and comes in today for a routine follow up visit.  Positive risk factors for bleeding include an age of 48 years or older.  The bleeding index is 'intermediate risk'.  Positive CHADS2 values include History of HTN and Age > 28 years old.  The start date was 05/23/2006.  Anticoagulation responsible provider: Aundra Dubin MD, Dalton.  INR POC: 4.0.  Cuvette Lot#: [id number].  Exp: 10/2011.    Anticoagulation Management Assessment/Plan:      The patient's current anticoagulation dose is Coumadin 5 mg tabs: Use as directed by anticoagulation clinic.  The target INR is 2.0-3.0.  The next INR is due 10/04/2010.  Anticoagulation instructions were given to patient.  Results were reviewed/authorized by Freddrick March, RN, BSN.  She was notified by Freddrick March RN.         Prior Anticoagulation Instructions: INR 2.3  Continue same dose of 1/2 tablet every day except 1 tablet on Thursday, Saturday and Sunday.  Recheck INR in 4 weeks.   Current Anticoagulation Instructions: INR 4.0  Skip today's dosage of Coumadin, then resume same dosage 1/2 tablet daily except 1 tablet on Sundays and  Thursdays.  Recheck in 2 weeks.

## 2010-12-29 NOTE — Medication Information (Signed)
Summary: [name] [name]   Anticoagulant Therapy  Managed by: Porfirio Oar, PharmD Referring MD: Virl Axe MD PCP: [name], MD Supervising MD: Ron Parker MD, Dellis Filbert Indication 1: Atrial Fibrillation (ICD-427.31) Lab Used: [facility] Site: [facility] INR POC 2.3 INR RANGE 2 - 3  Dietary changes: no    Health status changes: no    Bleeding/hemorrhagic complications: no    Recent/future hospitalizations: no    Any changes in medication regimen? no    Recent/future dental: no  Any missed doses?: no       Is patient compliant with meds? yes       Allergies: 1)  ! Sporanox (Itraconazole)  Anticoagulation Management History:      The patient is taking warfarin and comes in today for a routine follow up visit.  Positive risk factors for bleeding include an age of 75 years or older.  The bleeding index is 'intermediate risk'.  Positive CHADS2 values include History of HTN and Age > 3 years old.  The start date was 05/23/2006.  Anticoagulation responsible Venesha Petraitis: Ron Parker MD, Dellis Filbert.  INR POC: 2.3.  Cuvette Lot#: [id number].  Exp: 09/2011.    Anticoagulation Management Assessment/Plan:      The patient's current anticoagulation dose is Coumadin 5 mg tabs: Use as directed by anticoagulation clinic.  The target INR is 2.0-3.0.  The next INR is due 11/01/2010.  Anticoagulation instructions were given to patient.  Results were reviewed/authorized by Porfirio Oar, PharmD.  She was notified by Carmin Richmond, PharmD Candidate.         Prior Anticoagulation Instructions: INR 4.0  Skip today's dosage of Coumadin, then resume same dosage 1/2 tablet daily except 1 tablet on Sundays and Thursdays.  Recheck in 2 weeks.    Current Anticoagulation Instructions: INR 2.3 Continue previous dose of 0.5 tablets everyday except 1 tablets on Sunday and Thursday. Recheck INR in 4 weeks.

## 2010-12-29 NOTE — Medication Information (Signed)
Summary: rov/sp   Anticoagulant Therapy  Managed by: Porfirio Oar, PharmD Referring MD: Virl Axe MD Supervising MD: Johnsie Cancel MD, Collier Salina Indication 1: Atrial Fibrillation (ICD-427.31) Lab Used: [place] Site: [facility] INR POC 2.4 INR RANGE 2 - 3  Dietary changes: no    Health status changes: no    Bleeding/hemorrhagic complications: no    Recent/future hospitalizations: no    Any changes in medication regimen? no    Recent/future dental: no  Any missed doses?: no       Is patient compliant with meds? yes       Allergies: 1)  ! Sporanox (Itraconazole)  Anticoagulation Management History:      The patient is taking warfarin and comes in today for a routine follow up visit.  Positive risk factors for bleeding include an age of 75 years or older.  The bleeding index is 'intermediate risk'.  Positive CHADS2 values include History of HTN and Age > 75 years old.  The start date was 05/23/2006.  Anticoagulation responsible provider: Johnsie Cancel MD, Collier Salina.  INR POC: 2.4.  Cuvette Lot#: [id number].  Exp: 07/2011.    Anticoagulation Management Assessment/Plan:      The patient's current anticoagulation dose is Coumadin 5 mg tabs: Use as directed by anticoagulation clinic.  The target INR is 2.0-3.0.  The next INR is due 08/23/2010.  Anticoagulation instructions were given to patient.  Results were reviewed/authorized by Porfirio Oar, PharmD.  She was notified by Vassie Loll, PharmD Candidate.         Prior Anticoagulation Instructions: INR 3.1  Take Coumadin 0.5 tab (2.5 mg) tonight.  Then take Coumadin 0.5 tab (2.5 mg) on Mon, Tues, Wed, Fri and Coumadin 1 tab (5 mg) on Sun, Thur, Sat. Return to clinic in 2 weeks.    Current Anticoagulation Instructions: INR 2.4  Continue taking 1/2 tablet (2.5mg ) every day except take 1 tablet (5mg ) on Sundays, Thursdays, and Saturdays.  Recheck in 4 weeks.

## 2010-12-29 NOTE — Medication Information (Signed)
Summary: rov/sp  Anticoagulant Therapy  Managed by: Tula Nakayama, RN, BSN Referring MD: Virl Axe MD Supervising MD: Burt Knack MD, Legrand Como Indication 1: Atrial Fibrillation (ICD-427.31) Lab Used: [facility] Site: [facility] INR POC 3.2 INR RANGE 2 - 3  Dietary changes: yes       Details: Eating less green leafy veggies in process of moving  Health status changes: no    Bleeding/hemorrhagic complications: no    Recent/future hospitalizations: no    Any changes in medication regimen? no    Recent/future dental: no  Any missed doses?: no       Is patient compliant with meds? yes       Allergies: 1)  ! Sporanox (Itraconazole)  Anticoagulation Management History:      The patient is taking warfarin and comes in today for a routine follow up visit.  Positive risk factors for bleeding include an age of 75 years or older.  The bleeding index is 'intermediate risk'.  Positive CHADS2 values include History of HTN and Age > 75 years old.  The start date was 05/23/2006.  Anticoagulation responsible provider: Burt Knack MD, Legrand Como.  INR POC: 3.2.  Cuvette Lot#: [id number].  Exp: 06/2011.    Anticoagulation Management Assessment/Plan:      The patient's current anticoagulation dose is Coumadin 5 mg tabs: Use as directed by anticoagulation clinic.  The target INR is 2.0-3.0.  The next INR is due 06/07/2010.  Anticoagulation instructions were given to patient.  Results were reviewed/authorized by Tula Nakayama, RN, BSN.  She was notified by Tula Nakayama, RN, BSN.         Prior Anticoagulation Instructions: INR 2.5  Continue same dose of 1 tablet every day except 1/2 tablet on Monday, Wednesday and Friday   Current Anticoagulation Instructions: INR 3.2 Skip today's dose and then resume 5mg s everyday except 2.5mg s on Mondays, Wednesdays and Fridays. Recheck in 4 weeks.

## 2010-12-29 NOTE — Assessment & Plan Note (Signed)
Summary: F6M/DM      Allergies Added:   Primary Provider:  [name], MD   History of Present Illness: [name] seen in followup for paroxysmal atrial fibrillation for which she takes Rythmol and bradycardia for which she is status post pacemaker implantation. She has had some episodes of palpitations; notably these did not correlate with arrhythmias identified by her pacemaker. Overall she's doing pre-well apart from the stresses of trying to get her house of 36 years ready to sell now that she is widowed  Nevin Bloodgood interogated her pacer today and thresholds and impedence are fine.  She has had about 1% episodes of Afib.  Since she is already on propofenone and anticoagulation I think her current medical Rx is fine.  She can discuss possibility of alternative antiarrythmics with her next visit with Dr Caryl Comes.  She indicates that [name] is concerned about her cholesterol and BS.  She has not seen her primary [name] in years.  I told her we could try her on 2.5 of Crestor daily but she has had myalgias with other statins.  She has not started it yet but will.  She has sold her two homes and is at the [facility] complex off [address] now  Current Problems (verified): 1)  Mixed Hyperlipidemia  (ICD-272.2) 2)  Screening, Diabetes Mellitus  (ICD-V77.1) 3)  Pacemaker, Permanent  (ICD-V45.01) 4)  Dvt  (ICD-453.40) 5)  Sick Sinus Syndrome  (ICD-427.81) 6)  Mitral Insufficiency  (ICD-396.3) 7)  Edema  (ICD-782.3) 8)  Paroxysmal Atrial Fibrillation  (ICD-427.31) 9)  Hypertension  (ICD-401.9)  Current Medications (verified): 1)  Citalopram Hydrobromide 20 Mg Tabs (Citalopram Hydrobromide) .Marland Kitchen.. 1 Tab By Mouth Once Daily 2)  Propafenone Hcl 225 Mg Tabs (Propafenone Hcl) .Marland Kitchen.. 1 Tab By Mouth Two Times A Day 3)  Lorazepam 0.5 Mg Tabs (Lorazepam) .Marland Kitchen.. 1 Tab As Needed 4)  Vitamin D3 2000 Unit Caps (Cholecalciferol) .... Once Daily 5)  Calcim/mag .Marland Kitchen.. 1 Tab By Mouth Once Daily 6)  Folic Acid   (Folic Acid)  .Marland Kitchen.. 1 Tab By Mouth Once Daily 7)  Vitamin C .... 1 Tab By Mouth Two Times A Day 8)  Vitamin B12 .Marland Kitchen.. 1 Tab By Mouth Once Daily 9)  Biotin .Marland Kitchen.. 1 Tab As Needed 10)  Coumadin 5 Mg Tabs (Warfarin Sodium) .... Use As Directed By Anticoagulation Clinic 11)  Diltiazem Hcl 30 Mg Tabs (Diltiazem Hcl) .Marland Kitchen.. 1 To 2 By Mouth Every 6 Hours As Needed 12)  Atelvia 35 Mg Tbec (Risedronate Sodium) .Marland Kitchen.. 1 Tablet By Mouth Weekly  Allergies (verified): 1)  ! Sporanox (Itraconazole)  Past History:  Past Medical History: Last updated: Mar 12, 2009 Current Problems:  DVT (ICD-453.40) SICK SINUS SYNDROME (ICD-427.81) MITRAL INSUFFICIENCY (ICD-396.3) EDEMA (ICD-782.3) PAROXYSMAL ATRIAL FIBRILLATION (ICD-427.31) HYPERTENSION (ICD-401.9) Elevated INR Valvular heart disease depression  Family History: Last updated: 03/12/09 Her father died at 5 with a history of diabetes from a  pacemaker and hypertension.  Her mother has nonHodgkins lymphoma.  She has ahistory of CVA x2 and mitral valve prolapse.  She has one brother who died  at age 44 from a CVA and she has one sister with CAD and one sister alive  and well.  Social History: Last updated: 03/12/2009 Widowed  Tobacco Use - Yes.  Alcohol Use - yes  Review of Systems       Denies fever, malais, weight loss, blurry vision, decreased visual acuity, cough, sputum, SOB, hemoptysis, pleuritic pain, palpitaitons, heartburn, abdominal pain, melena, lower extremity edema, claudication,  or rash.   Vital Signs:  Patient profile:   77 year old female Height:      66 inches Weight:      156 pounds BMI:     25.27 Pulse rate:   68 / minute Resp:     12 per minute BP sitting:   140 / 90  (left arm)  Vitals Entered By: Burnett Kanaris (August 23, 2010 11:42 AM)  Physical Exam  General:  Affect appropriate Healthy:  appears stated age 18: normal Neck supple with no adenopathy JVP normal no bruits no thyromegaly Lungs clear with no wheezing  and good diaphragmatic motion Heart:  S1/S2 no murmur,rub, gallop or click PMI normal Abdomen: benighn, BS positve, no tenderness, no AAA no bruit.  No HSM or HJR Distal pulses intact with no bruits No edema Neuro non-focal Skin warm and dry    PPM Specifications Following MD:  Virl Axe, MD     Referring MD:  [facility] PPM Vendor:  [employer]     PPM Model Number:  [id number]     PPM Serial Number:  [id number] PPM DOI:  09/14/2005     PPM Implanting MD:  Virl Axe, MD  Lead 1    Location: [facility]     DOI: 09/14/2005     Model #: [id number]     Serial #: [id number]     Status: active Lead 2    Location: [facility]     DOI: 09/14/2005     Model #: [id number]     Serial #[id number]     Status: active  Magnet Response Rate:  BOL 85 ERI 65  Indications:  Tachy-Brady Syndrome   PPM Follow Up Pacer Dependent:  No      Episodes Coumadin:  Yes  Parameters Mode:  MVP     Lower Rate Limit:  60     Upper Rate Limit:  130 Paced AV Delay:  180     Sensed AV Delay:  150  Impression & Recommendations:  Problem # 1:  MIXED HYPERLIPIDEMIA (ICD-272.2) Will try to start statin and F/U labs in 3 months  Problem # 2:  PACEMAKER, PERMANENT (ICD-V45.01) Normal functoin with no bradycardia  Problem # 3:  PAROXYSMAL ATRIAL FIBRILLATION (ICD-427.31) SK has seen and with 1% PAF rate continue propafenone at current dose INR Rx  do not want her on Pradaxa as she has fallen multiple times in past and no antidote for Pradaxa Her updated medication list for this problem includes:    Propafenone Hcl 225 Mg Tabs (Propafenone hcl) .Marland Kitchen... 1 tab by mouth two times a day    Coumadin 5 Mg Tabs (Warfarin sodium) ..... Use as directed by anticoagulation clinic  Problem # 4:  HYPERTENSION (ICD-401.9) Well controlled Her updated medication list for this problem includes:    Diltiazem Hcl 30 Mg Tabs (Diltiazem hcl) .Marland Kitchen... 1 to 2 by mouth every 6 hours as needed  Patient Instructions: 1)  Your physician recommends that you schedule a  follow-up appointment in: 6 months 2)  Your physician has recommended you make the following change in your medication: call me if able to tolerate the crestor

## 2010-12-29 NOTE — Medication Information (Signed)
Summary: [name] [name]  Anticoagulant Therapy  Managed by: Freddrick March, RN, BSN Referring MD: Virl Axe MD Supervising MD: Caryl Comes MD, Remo Lipps Indication 1: Atrial Fibrillation (ICD-427.31) Lab Used: [place] Site: [facility] INR POC 2.7 INR RANGE 2 - 3  Dietary changes: no    Health status changes: no    Bleeding/hemorrhagic complications: no    Recent/future hospitalizations: no    Any changes in medication regimen? yes       Details: Starting on once weekly bone medication.  Recent/future dental: no  Any missed doses?: no       Is patient compliant with meds? yes       Allergies: 1)  ! Sporanox (Itraconazole)  Anticoagulation Management History:      The patient is taking warfarin and comes in today for a routine follow up visit.  Positive risk factors for bleeding include an age of 75 years or older.  The bleeding index is 'intermediate risk'.  Positive CHADS2 values include History of HTN and Age > 66 years old.  The start date was 05/23/2006.  Anticoagulation responsible provider: Caryl Comes MD, Remo Lipps.  INR POC: 2.7.  Cuvette Lot#: [id number].  Exp: 02/2011.    Anticoagulation Management Assessment/Plan:      The patient's current anticoagulation dose is Coumadin 5 mg tabs: Use as directed by anticoagulation clinic.  The target INR is 2.0-3.0.  The next INR is due 02/08/2010.  Anticoagulation instructions were given to patient.  Results were reviewed/authorized by Freddrick March, RN, BSN.  She was notified by Freddrick March RN.         Prior Anticoagulation Instructions: INR 2.0  Continue same dose of 1 tablet daily except 0.5 tablet on Mondays, Wednesdays, and Fridays. Recheck in 4 weeks.  Current Anticoagulation Instructions: INR 2.7  Continue on same dosage 1 tablet daily except 1/2 tablet on Mondays, Wednesdays, and Fridays.  Recheck in 4 weeks.

## 2010-12-29 NOTE — Assessment & Plan Note (Signed)
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 She has had some episodes of palpitations; notably these did not correlate with arrhythmias identified by her pacemaker. Overall she's doing pre-well apart from the stresses of trying to get her house of 36 years ready to sell now that she is widowed  [name] [name] interogated her pacer today and thresholds and impedence are fine.  She has had about 1% episodes of Afib.  Since she is already on propofenone and anticoagulation I think her current medical Rx is fine.  She can discuss possibility of alternative antiarrythmics with her next visit with [name] [name].  She indicates that [name] [name] is concerned about her cholesterol and BS.  She has not seen her primary [name] [name] in years.  I told her we could try her on 2.5 of Crestor daily but she has had myalgias with other statins.  We will check a HbA1c today  Current Problems (verified): 1)  Pacemaker, Permanent  (ICD-V45.01) 2)  Dvt  (ICD-453.40) 3)  Sick Sinus Syndrome  (ICD-427.81) 4)  Mitral Insufficiency  (ICD-396.3) 5)  Edema  (ICD-782.3) 6)  Paroxysmal Atrial Fibrillation  (ICD-427.31) 7)  Hypertension  (ICD-401.9)  Current Medications (verified): 1)  Citalopram Hydrobromide 20 Mg Tabs (Citalopram Hydrobromide) .Marland Kitchen.. 1 Tab By Mouth Once Daily 2)  Propafenone Hcl 225 Mg Tabs (Propafenone Hcl) .Marland Kitchen.. 1 Tab By Mouth Two Times A Day 3)  Lorazepam 0.5 Mg Tabs (Lorazepam) .Marland Kitchen.. 1 Tab As Needed 4)  Vitamin D 1000 Unit  Tabs (Cholecalciferol) .... 2 Tab By Mouth Once Daily 5)  Calcim/mag .Marland Kitchen.. 1 Tab By Mouth Once Daily 6)  Folic Acid   (Folic Acid) .Marland Kitchen.. 1 Tab By Mouth Once Daily 7)  Vitamin C .... 1 Tab By Mouth Two Times A Day 8)  Vitamin B12 .Marland Kitchen.. 1 Tab By Mouth Once  Daily 9)  Biotin .Marland Kitchen.. 1 Tab As Needed 10)  Coumadin 5 Mg Tabs (Warfarin Sodium) .... Use As Directed By Anticoagulation Clinic 11)  Diltiazem Hcl 30 Mg Tabs (Diltiazem Hcl) .Marland Kitchen.. 1 To 12 By Mouth Every 6 Hours As Needed  Allergies (verified): 1)  ! Sporanox (Itraconazole)  Past History:  Past Medical History: Last updated: 02/18/2009 Current Problems:  DVT (ICD-453.40) SICK SINUS SYNDROME (ICD-427.81) MITRAL INSUFFICIENCY (ICD-396.3) EDEMA (ICD-782.3) PAROXYSMAL ATRIAL FIBRILLATION (ICD-427.31) HYPERTENSION (ICD-401.9) Elevated INR Valvular heart disease depression  Family History: Last updated: 02-18-09 Her father died at 67 with a history of diabetes from a  pacemaker and hypertension.  Her mother has nonHodgkins lymphoma.  She has ahistory of CVA x2 and mitral valve prolapse.  She has one brother who died  at age 21 from a CVA and she has one sister with CAD and one sister alive  and well.  Social History: Last updated: Feb 18, 2009 Widowed  Tobacco Use - Yes.  Alcohol Use - yes  Review of Systems       Denies fever, malais, weight loss, blurry vision, decreased visual acuity, cough, sputum, SOB, hemoptysis, pleuritic pain, palpitaitons, heartburn, abdominal pain, melena, lower extremity edema, claudication, or rash.   Vital Signs:  Patient profile:   77 year old female Height:      66 inches Weight:      161 pounds BMI:  26.08 Pulse rate:   62 / minute Resp:     12 per minute BP sitting:   157 / 91  (left arm)  Vitals Entered By: [name] [name] (February 15, 2010 11:13 AM)  Physical Exam  General:  Affect appropriate Healthy:  appears stated age 61: normal Neck supple with no adenopathy JVP normal no bruits no thyromegaly Lungs clear with no wheezing and good diaphragmatic motion Heart:  S1/S2 no murmur,rub, gallop or click PMI normal Abdomen: benighn, BS positve, no tenderness, no AAA no bruit.  No HSM or HJR Distal pulses intact with no  bruits No edema Neuro non-focal Skin warm and dry    PPM Specifications Following MD:  [name] Axe, MD     Referring MD:  [name] [name] PPM Vendor:  [employer]     PPM Model Number:  [id number]     PPM Serial Number:  [id number] PPM DOI:  09/14/2005     PPM Implanting MD:  [name] Axe, MD  Lead 1    Location: RA     DOI: 09/14/2005     Model #: [id number]     Serial #: [id number]     Status: active Lead 2    Location: RV     DOI: 09/14/2005     Model #: [id number]     Serial #: [id number]     Status: active  Magnet Response Rate:  BOL 85 ERI 65  Indications:  Tachy-Brady Syndrome   PPM Follow Up Remote Check?  No Battery Voltage:  3.00 V     Pacer Dependent:  No       PPM Device Measurements Atrium  Amplitude: 2.1 mV, Impedance: 576 ohms, Threshold: 0.5 V at 0.4 msec Right Ventricle  Amplitude: 3.4 mV, Impedance: 400 ohms, Threshold: 0.5 V at 0.4 msec  Episodes MS Episodes:  21     Percent Mode Switch:  1.3%     Coumadin:  Yes Atrial Pacing:  14.9%     Ventricular Pacing:  0.2%  Parameters Mode:  MVP     Lower Rate Limit:  60     Upper Rate Limit:  130 Paced AV Delay:  180     Sensed AV Delay:  150 Next Cardiology Appt Due:  08/27/2010 Tech Comments:  No parameter changes. Longest A-fib 17 hours, + coumadin, 50% ventricular rates > 100 bpm during A-fib.  ROV 6 months with [name]. Caryl [name]. [name] Friendly, LPN  March 22, 624THL 624THL AM   Impression & Recommendations:  Problem # 1:  PACEMAKER, PERMANENT (ICD-V45.01) Working well by interogation today.  F/U [name] [name]  Problem # 2:  PAROXYSMAL ATRIAL FIBRILLATION (ICD-427.31) Fairly well controlled except during extreme stress.  Continue Propafenone at current dose and anticoagulation Her updated medication list for this problem includes:    Propafenone Hcl 225 Mg Tabs (Propafenone hcl) .Marland Kitchen... 1 tab by mouth two times a day    Coumadin 5 Mg Tabs (Warfarin sodium) ..... Use as directed by anticoagulation clinic  Problem # 3:  HYPERTENSION  (ICD-401.9) Consider adding ACE if HbA1c elevated Her updated medication list for this problem includes:    Diltiazem Hcl 30 Mg Tabs (Diltiazem hcl) .Marland Kitchen... 1 to 12 by mouth every 6 hours as needed  Orders: TLB-A1C / Hgb A1C (Glycohemoglobin) (83036-A1C)  Problem # 4:  MIXED HYPERLIPIDEMIA (ICD-272.2) Get labs form [facility].  Try low dose Crestor.  F/U in 8-10 weeks  Patient Instructions: 1)  Your physician recommends that you schedule a follow-up appointment in: 6  months 2)  Your physician recommends that you return for lab work in:8 WEEKS IF TOLERATE CRESTOR 3)  Your physician has recommended you make the following change in your medication: START CRESTOR SAMPLES 5MG  ONCE DAILY   EKG Report  Procedure date:  02/15/2010  Findings:      NSR 62 RBBB QT 430

## 2010-12-29 NOTE — Medication Information (Signed)
Summary: [name] [name]  Anticoagulant Therapy  Managed by: Tula Nakayama, RN, BSN Referring MD: Virl Axe MD Supervising MD: Ron Parker MD, Dellis Filbert Indication 1: Atrial Fibrillation (ICD-427.31) Lab Used: [facility] Site: [facility] INR POC 2.5 INR RANGE 2 - 3  Dietary changes: no    Health status changes: no    Bleeding/hemorrhagic complications: no    Recent/future hospitalizations: no    Any changes in medication regimen? no    Recent/future dental: no  Any missed doses?: no       Is patient compliant with meds? yes       Allergies: 1)  ! Sporanox (Itraconazole)  Anticoagulation Management History:      Positive risk factors for bleeding include an age of 25 years or older.  The bleeding index is 'intermediate risk'.  Positive CHADS2 values include History of HTN and Age > 75 years old.  The start date was 05/23/2006.  Anticoagulation responsible provider: Ron Parker MD, Dellis Filbert.  INR POC: 2.5.  Exp: 02/2011.    Anticoagulation Management Assessment/Plan:      The patient's current anticoagulation dose is Coumadin 5 mg tabs: Use as directed by anticoagulation clinic.  The target INR is 2.0-3.0.  The next INR is due 03/08/2010.  Anticoagulation instructions were given to patient.  Results were reviewed/authorized by Tula Nakayama, RN, BSN.  She was notified by Tula Nakayama, RN, BSN.         Prior Anticoagulation Instructions: INR 2.7  Continue on same dosage 1 tablet daily except 1/2 tablet on Mondays, Wednesdays, and Fridays.  Recheck in 4 weeks.    Current Anticoagulation Instructions: INR 2.5 Continue 5mg s everyday excpet 2.5mg s on Mondays, Wednesdays and Fridays. Recheck in 4 weeks.

## 2010-12-29 NOTE — Progress Notes (Signed)
Summary: fell last night knock herself out    Phone Note Call from Patient Call back at [facility] Phone [phone]   Caller: Patient Reason for Call: Talk to Nurse Summary of Call: per pt calling, pt fell last night knock herself out. pt not sure how it happen. pain starting hurting  ~ 3-4 this am. pt on coumadin. pt aware that debra off today. wanted to know should she have a ct scan. pcp was not contacted. Initial call taken by: Neil Crouch,  August 26, 2010 10:59 AM  Follow-up for Phone Call        08/26/10--1115AM--Pt calling stating she thinks she fell over dog last noc AND "PASSED OUT""--she states she thinks she hit head near eyebrow--no bleeding noted--took 1/2 vicodin she had around and feels much better now--pt states when this happened in past dr Caryl Comes or dr Johnsie Cancel always ordered head CT--advised i would send message to dr Caryl Comes as dr Johnsie Cancel not here today--pt states in past dr Caryl Comes would order head CT due to coumadin --advised to see her PCP and let him know what happened but she seems reluctant to do this--i encouraged her to f/u with this Follow-up by: Leodis Sias, RN,  August 26, 2010 11:26 AM     Appended Document: fell last night knock herself out  Patient should have noncontrast CT if on coumadin.  If she is feeling ok can be done on Monday  Appended Document: fell last night knock herself out  PT AWARE PER PT FEELS [facility] WILL HAVE OFFICE CALL MON AM TO SCHEDULE HEAD CT .INFORMED PT IF HAS N/V PAIN OR DIZZINESS  OVER WEEKEND TO GO TO ER FOR EVALUATION AND TX VERBALIZED UNDERSTANDING.

## 2011-01-06 ENCOUNTER — Encounter: Payer: Self-pay | Admitting: Cardiology

## 2011-01-06 ENCOUNTER — Encounter (INDEPENDENT_AMBULATORY_CARE_PROVIDER_SITE_OTHER): Payer: Medicare Other

## 2011-01-06 DIAGNOSIS — Z7901 Long term (current) use of anticoagulants: Secondary | ICD-10-CM

## 2011-01-06 DIAGNOSIS — I4891 Unspecified atrial fibrillation: Secondary | ICD-10-CM

## 2011-01-06 LAB — CONVERTED CEMR LAB: POC INR: 2.4

## 2011-01-12 NOTE — Medication Information (Signed)
Summary: Coumadin Clinic   Anticoagulant Therapy  Managed by: Danella Penton, RN Referring MD: Virl Axe MD PCP: [name], MD Supervising MD: Verl Blalock MD, Marcello Moores Indication 1: Atrial Fibrillation (ICD-427.31) Lab Used: [facility] Site: [facility] INR POC 2.4 INR RANGE 2 - 3  Dietary changes: no    Health status changes: no    Bleeding/hemorrhagic complications: no    Recent/future hospitalizations: no    Any changes in medication regimen? no    Recent/future dental: no  Any missed doses?: no       Is patient compliant with meds? yes       Allergies: 1)  ! Sporanox (Itraconazole)  Anticoagulation Management History:      The patient is taking warfarin and comes in today for a routine follow up visit.  Positive risk factors for bleeding include an age of 75 years or older.  The bleeding index is 'intermediate risk'.  Positive CHADS2 values include History of HTN and Age > 75 years old.  The start date was 05/23/2006.  Anticoagulation responsible provider: Verl Blalock MD, Marcello Moores.  INR POC: 2.4.  Cuvette Lot#: [id number].  Exp: 11/2011.    Anticoagulation Management Assessment/Plan:      The patient's current anticoagulation dose is Coumadin 5 mg tabs: Use as directed by anticoagulation clinic.  The target INR is 2.0-3.0.  The next INR is due 02/07/2011.  Anticoagulation instructions were given to patient.  Results were reviewed/authorized by Danella Penton, RN.  She was notified by Danella Penton, RN.         Prior Anticoagulation Instructions: INR 2.0 (INR goal: 2-3)  Take 1 tablet on Sundays, Thursdays, and Saturdays and 1/2 tablet on Mondays, Tuesdays, Wednesdays, and Fridays.    Current Anticoagulation Instructions: INR 2.4 Continue taking 1/2 tablet everyday, except take 1 tablet on Sundays, Thursdays, Saturdays. Recheck in 4 weeks.

## 2011-01-24 ENCOUNTER — Encounter (INDEPENDENT_AMBULATORY_CARE_PROVIDER_SITE_OTHER): Payer: Self-pay | Admitting: *Deleted

## 2011-02-02 ENCOUNTER — Ambulatory Visit: Payer: Self-pay | Admitting: Cardiovascular Disease

## 2011-02-02 NOTE — Letter (Signed)
Summary: Appointment - [facility] [place]  [address] [place]   [place], [place] [address]   Phone: [phone]  Fax: [phone]     January 24, 2011 MRN: [id number]   [facility] [address] [place], [place]  [address]   Dear [name],   Due to a change in our office schedule, your appointment on  02-02-11 at  9:00a             must be changed.  It is very important that we reach you to reschedule this appointment. We look forward to participating in your health care needs. Please contact us at the number listed above at your earliest convenience to reschedule this appointment.     Sincerely,  Public relations account executive

## 2011-02-06 ENCOUNTER — Encounter: Payer: Self-pay | Admitting: Cardiovascular Disease

## 2011-02-06 DIAGNOSIS — I4891 Unspecified atrial fibrillation: Secondary | ICD-10-CM

## 2011-02-06 DIAGNOSIS — I82409 Acute embolism and thrombosis of unspecified deep veins of unspecified lower extremity: Secondary | ICD-10-CM

## 2011-02-07 ENCOUNTER — Encounter: Payer: Self-pay | Admitting: Cardiovascular Disease

## 2011-02-07 ENCOUNTER — Encounter (INDEPENDENT_AMBULATORY_CARE_PROVIDER_SITE_OTHER): Payer: Medicare Other

## 2011-02-07 DIAGNOSIS — I4891 Unspecified atrial fibrillation: Secondary | ICD-10-CM

## 2011-02-07 DIAGNOSIS — Z7901 Long term (current) use of anticoagulants: Secondary | ICD-10-CM

## 2011-02-07 LAB — CONVERTED CEMR LAB: POC INR: 2.4

## 2011-02-14 NOTE — Medication Information (Signed)
Summary: rov/pc   Anticoagulant Therapy  Managed by: Maryanna Shape, PharmD Referring MD: Virl Axe MD PCP: [name], MD Supervising MD: Angelena Form MD, Harrell Gave Indication 1: Atrial Fibrillation (ICD-427.31) Lab Used: [facility] Site: [facility] INR POC 2.4 INR RANGE 2 - 3  Dietary changes: no    Health status changes: no    Bleeding/hemorrhagic complications: no    Recent/future hospitalizations: no    Any changes in medication regimen? no    Recent/future dental: no  Any missed doses?: no       Is patient compliant with meds? yes       Allergies: 1)  ! Sporanox (Itraconazole)  Anticoagulation Management History:      Positive risk factors for bleeding include an age of 41 years or older.  The bleeding index is 'intermediate risk'.  Positive CHADS2 values include History of HTN and Age > 62 years old.  The start date was 05/23/2006.  Anticoagulation responsible provider: Angelena Form MD, Harrell Gave.  INR POC: 2.4.  Cuvette Lot#: [id number].  Exp: 01/2012.    Anticoagulation Management Assessment/Plan:      The patient's current anticoagulation dose is Coumadin 5 mg tabs: Use as directed by anticoagulation clinic.  The target INR is 2.0-3.0.  The next INR is due 03/07/2011.  Anticoagulation instructions were given to patient.  Results were reviewed/authorized by Maryanna Shape, PharmD.         Prior Anticoagulation Instructions: INR 2.4 Continue taking 1/2 tablet everyday, except take 1 tablet on Sundays, Thursdays, Saturdays. Recheck in 4 weeks.  Current Anticoagulation Instructions: INR 2.4  Take 1/2 tablet every day except for Tuesday, Thursday and Saturday take 1 tablet  recheck INR in 4 weeks.

## 2011-02-15 ENCOUNTER — Encounter: Payer: Self-pay | Admitting: Cardiovascular Disease

## 2011-02-27 ENCOUNTER — Ambulatory Visit (INDEPENDENT_AMBULATORY_CARE_PROVIDER_SITE_OTHER): Payer: Medicare Other | Admitting: Cardiovascular Disease

## 2011-02-27 ENCOUNTER — Ambulatory Visit (INDEPENDENT_AMBULATORY_CARE_PROVIDER_SITE_OTHER): Payer: Medicare Other | Admitting: *Deleted

## 2011-02-27 ENCOUNTER — Encounter: Payer: Self-pay | Admitting: Cardiovascular Disease

## 2011-02-27 VITALS — BP 143/76 | HR 60 | Resp 14 | Ht 65.0 in | Wt 156.0 lb

## 2011-02-27 DIAGNOSIS — Z950 Presence of cardiac pacemaker: Secondary | ICD-10-CM

## 2011-02-27 DIAGNOSIS — I4891 Unspecified atrial fibrillation: Secondary | ICD-10-CM

## 2011-02-27 DIAGNOSIS — E782 Mixed hyperlipidemia: Secondary | ICD-10-CM

## 2011-02-27 NOTE — Progress Notes (Signed)
Pacer check in clinic  

## 2011-02-27 NOTE — Progress Notes (Signed)
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 She has had  Very infrequent  episodes of palpitations; notably these did not correlate with arrhythmias identified by her pacemaker .  I reveiwed her PaceArt interogation with Dr [name] [name].  Battery Voltage is fine.  In afib less than 1 % of time and lead impedences are ok.  Overall she's doing pretty well apart fro life stressers.  She is widowed, sold her house and  Her sister recently died of a subdural  She indicates that Dr [name] [name] is concerned about her cholesterol and BS.  She has not seen her primary [name] [name] in years.  Last visit we tried her on very low dose crestor 2.5 mg and like all other statins she had myalgias.  Given her age and lack of vascular disease I told her diet Rx should suffice.     Reviewed office notes from Dr [name] [name] her OB/GYN that acts as her primary.  Only complaints then were irritable bowel and vulva pruritis.  Reviewed PaceArt Consulted with Dr [name] [name]  ROS: Denies fever, malais, weight loss, blurry vision, decreased visual acuity, cough, sputum, SOB, hemoptysis, pleuritic pain, palpitaitons, heartburn, abdominal pain, melena, lower extremity edema, claudication, or rash.   General: Affect appropriate Healthy:  appears stated age 77: normal Neck supple with no adenopathy JVP normal no bruits no thyromegaly Lungs clear with no wheezing and good diaphragmatic motion Heart:  S1/S2 no murmur,rub, gallop or click PMI normal Abdomen: benighn, BS positve, no tenderness, no AAA no bruit.  No HSM or HJR Distal pulses intact with no bruits No edema Neuro non-focal Skin warm and dry No muscular weakness   Current Outpatient Prescriptions  Medication Sig Dispense Refill  . Ascorbic Acid (VITAMIN C CR PO) as needed.       Marland Kitchen BIOTIN FORTE PO Take by mouth as needed.        . calcium & magnesium carbonates  (MYLANTA) 311-232 MG per tablet Take 1 tablet by mouth daily.        . Cholecalciferol (VITAMIN D3) 2000 UNITS TABS Take by mouth daily.        . citalopram (CELEXA) 20 MG tablet Take 20 mg by mouth daily.        . Cyanocobalamin (VITAMIN B-12 PO) Take by mouth daily.        Marland Kitchen diltiazem (CARDIZEM) 30 MG tablet Take 30 mg by mouth as needed.       . folic acid (FOLVITE) 1 MG tablet Take 1 mg by mouth daily.        Marland Kitchen LORazepam (ATIVAN) 0.5 MG tablet Take 0.5 mg by mouth as needed.        . propafenone (RYTHMOL) 225 MG tablet Take 225 mg by mouth 2 (two) times daily.        . Risedronate Sodium (ATELVIA) 35 MG TBEC Take by mouth once a week.        . warfarin (COUMADIN) 5 MG tablet Take by mouth as directed.          Allergies  Itraconazole  Electrocardiogram: A pacing at lower rate limit of 60  RBBB  Assessment and Plan

## 2011-02-27 NOTE — Patient Instructions (Signed)
Your physician recommends that you schedule a follow-up appointment in: ONE YEAR 

## 2011-02-27 NOTE — Assessment & Plan Note (Signed)
Diet Rx  Intolerant to statins and no vascular disease

## 2011-02-27 NOTE — Assessment & Plan Note (Signed)
F/U Dr Caryl Comes 6 months.  Normal functioning

## 2011-02-27 NOTE — Assessment & Plan Note (Addendum)
Previously on Flecainide.  Rhythmol effective with no side effects.  QRS 128 QT 446.  <!% incidence of afib on interogation.  No need to change meds at this time

## 2011-03-07 ENCOUNTER — Ambulatory Visit (INDEPENDENT_AMBULATORY_CARE_PROVIDER_SITE_OTHER): Payer: Medicare Other | Admitting: *Deleted

## 2011-03-07 DIAGNOSIS — I4891 Unspecified atrial fibrillation: Secondary | ICD-10-CM

## 2011-03-07 DIAGNOSIS — Z7901 Long term (current) use of anticoagulants: Secondary | ICD-10-CM

## 2011-03-07 DIAGNOSIS — I82409 Acute embolism and thrombosis of unspecified deep veins of unspecified lower extremity: Secondary | ICD-10-CM

## 2011-03-15 ENCOUNTER — Encounter: Payer: Self-pay | Admitting: Cardiovascular Disease

## 2011-03-15 ENCOUNTER — Encounter: Payer: Self-pay | Admitting: Cardiology

## 2011-03-17 ENCOUNTER — Telehealth: Payer: Self-pay | Admitting: Cardiovascular Disease

## 2011-03-17 NOTE — Telephone Encounter (Signed)
Pt states she needs to talk dr Johnsie Cancel or his nurse re a letter from dr. Prudencio Burly re pt condition. Pt wants to know if the dr received the letter.

## 2011-03-17 NOTE — Telephone Encounter (Signed)
Spoke with pt who states she fell into a bush and she received a black eye and a scratch on her check.  She was evaluated by Dr Tyrone Schimke and pt states he told her she was fine but to let Dr Johnsie Cancel know.  Pt reports that bruising has migrated down her face from her eye into check and chin.  She denies need to be evaluated - just wanted to let Dr Johnsie Cancel know.  Will forward information to Dr Johnsie Cancel for his knowledge.

## 2011-04-04 ENCOUNTER — Ambulatory Visit (INDEPENDENT_AMBULATORY_CARE_PROVIDER_SITE_OTHER): Payer: Medicare Other | Admitting: *Deleted

## 2011-04-04 DIAGNOSIS — I82409 Acute embolism and thrombosis of unspecified deep veins of unspecified lower extremity: Secondary | ICD-10-CM

## 2011-04-04 DIAGNOSIS — I4891 Unspecified atrial fibrillation: Secondary | ICD-10-CM

## 2011-04-04 LAB — POCT INR: INR: 3.8

## 2011-04-11 NOTE — Assessment & Plan Note (Signed)
[facility]                            CARDIOLOGY OFFICE NOTE   NAME:[name], [name] BEMISS                MRN:          [id number]  DATE:11/13/2007                            DOB:          September 14, 1932    [name] [name] returns today for followup.  She has had sick sinus  syndrome with PAF and lower extremity edema.   In October, she fell and hit her head.  Her CT scan was negative.  Her  pacer showed no malfunction.  She did have 2 episodes of what appeared  to be PAF or atrial arrhythmias.  Had a long discussion with [name] [name].  I would like to, at some point, get her off of Coumadin.  we will check  her again in 3 months to see if she has had any mode-switching.   She has had an occasional palpitation, but nothing prolonged.  I think  the propafenone is probably doing a good job suppressing her atrial  arythmias.   She also has had some lower extremity edema.  It would appear to be  benign and dependent.  There is no history of DVT.  Her legs are bad at  the end of the day and improved in the morning.  I talked to her about a  low-salt diet and support hose.  However, she wanted a refill on some  hydrochlorothiazide that [name] [name], M.D. had given her in the past.  I told her I would call this in to [facility] for her.   REVIEW OF SYSTEMS:  Otherwise, negative.   Her last Coumadin level was a little bit high at 4 and she needs to get  it rechecked next week.  She has not had any abnormal bleeding.   MEDICATIONS:  1. Cardizem 180 a day.  2. Coumadin as directed.  3. Propafenone 123456 b.i.d.  4. Folic acid.  5. Vitamins.  6. Citalopram 20 a day.  7. Hydrochlorothiazide 25 mg p.r.n.   EXAM:  Remarkable for a healthy-appearing white female in no distress.  Her heart rate is 74, blood pressure 130/80, weight 150 and afebrile.  Respiratory rate is 14.  HEENT:  Unremarkable.  LUNGS:  Clear.  Good diaphragmatic motion.  No wheezing.  S1, S2  with normal heart sounds.  Pacer in good position in the left  clavicle.  PMI normal.  ABDOMEN:  Benign.  Bowel sounds positive.  No tenderness.  No  hepatosplenomegaly.  No hepatojugular reflux.  Distal pulses intact.  Trace edema in the left lower extremity.  NEURO:  Nonfocal.  No muscular weakness.   IMPRESSION:  1. Sick sinus syndrome, pacemaker working well.  Follow up in 3      months.  Parameters and voltage normal.  2. History of paroxysmal atrial fibrillation.  Continue propafenone.      Interrogate pacemaker.  Consider stopping Coumadin in 3 to 6      months.  3. Elevated INR.  Follow up next week.  Avoid antibiotics.  Consider      addition of leafy green vegetables if she continues to run high or      a  lower dose of Coumadin per clinic.  4. Lower extremity edema.  Hydrochlorothiazide called in to [facility].      Low salt diet.  I will see her back in about 3 months.     [name] [name]. [name] Cancel, MD, [facility]  Electronically Signed    PCN/MedQ  DD: 11/13/2007  DT: 11/13/2007  Job #: [id number]

## 2011-04-11 NOTE — Assessment & Plan Note (Signed)
[facility]                         ELECTROPHYSIOLOGY OFFICE NOTE   NAME:Rettke, HERMILA MUZZY                MRN:          [id number]  DATE:02/26/2008                            DOB:          Feb 07, 1932    HISTORY:  Ms. Stukey comes in today for followup for atrial  fibrillation and her previously implanted pacemaker.  She is feeling  quite well.  She has a number of questions.  The first is that she has  noticed that she has had more problems with peripheral edema in the  past.  She has been treated with a diuretic.  She also recently  underwent an echo and was not quite sure how to interpret the  information that she received from the nurses.  The third question is  whether she should come off of Coumadin.   MEDICATIONS:  1. Include Cardizem 180.  2. Propafenone 225 b.i.d.  3. Coumadin.   PHYSICAL EXAMINATION:  VITAL SIGNS:  On examination her blood pressure  is 112/74.  Her pulse was 66.  LUNGS:  Her lungs were clear.  HEART:  Her heart sounds were regular.  EXTREMITIES:  The extremities have one to two plus edema bilaterally.  NECK:  Veins were flat.  She was in no acute distress.   Interrogation of her Medtronic pacemaker demonstrated a P-wave of 0.8  with impedance of 536, a threshold of 0.5 at 0.4, the R-wave was 4.1  with impedance of 384, threshold of 0.5 at 0.4.  Battery voltage is 3.0.  There were very few atrial high rate episodes, about 10 over the last  year, a couple of which have lasted more than 6 hours.   IMPRESSION:  1. Paroxysmal atrial fibrillation relatively well-controlled on      propafenone.  2. Peripheral edema, question related to Cardizem.  3. Thromboembolic risk factors notable for:      a.     Borderline blood pressure.      b.     Prior stroke confirmed by MRI.      c.     Borderline age and gender.  4. Mitral regurgitation - now moderate from previously mild with a      family history of mitral valve  prolapse.   DISCUSSION:  Ms. Furrow has atrial fibrillation that is paroxysmal  and relatively well-controlled.   Her edema may be coming from her Cardizem and so I have suggested that  she discontinue it for a couple of weeks and then let us know whether it  abates.  In the event that that is the case we could use p.r.n. AV nodal  blocking agent as opposed to daily and that would hopefully not run into  the same problem.   As relates to Coumadin she is not a candidate for its discontinuation.  With prior cerebral strokes lifelong Coumadin is appropriate in the  absence of a left atrial appendectomy procedure.   We will see her again in 6 months.  I should note that I went over the  echo with her extensively, describing the tricuspid and atrial  regurgitation which are chronic noted on the 2006  echo from [place] as  well as the mitral regurgitation which is somewhat worse and she is  scheduled to have repeat echocardiography in about 6 months' time.     Deboraha Sprang, MD, [facility]  Electronically Signed    SCK/MedQ  DD: 02/26/2008  DT: 02/26/2008  Job #: [id number]

## 2011-04-11 NOTE — Assessment & Plan Note (Signed)
[place] OFFICE NOTE   NAME:Schertzer, AMYLAH ARA                MRN:          [id number]  DATE:09/13/2007                            DOB:          [birth year]    The patient was seen today in the clinic on October 17, at the request  of Dr. Johnsie Cancel for the patient having a syncopal episode yesterday.  On  interrogation of her device today, her battery voltage is 3.02.  P waves  measured 1.4 millivolts with an atrial capture threshold of 0.5 volts at  0.4 milliseconds and an atrial lead impedance of 536 ohms.  R waves  measured 4.1 millivolts with a ventricular pacing threshold of 1 volt at  0.4 milliseconds and a ventricular lead impedance of 400 ohms.  There  were two mode switch episodes noted totally 0.4% of the time, she is on  Coumadin therapy.  Her underlying rhythm today was a sinus bradycardia  at 55 beats per minute.  No changes were made in her parameters and she  will be seen as scheduled.      Alma Friendly, LPN  Electronically Signed      Deboraha Sprang, MD, [facility]  Electronically Signed   PO/MedQ  DD: 09/13/2007  DT: 09/15/2007  Job #: [id number]

## 2011-04-11 NOTE — Assessment & Plan Note (Signed)
[facility]                            CARDIOLOGY OFFICE NOTE   NAME:[name] [name], [name] [name]                MRN:          [id number]  DATE:01/31/2008                            DOB:          1932-11-08    HISTORY:  [name] [name] returns today for followup.  She has had paroxysmal  atrial fibrillation with sick sinus syndrome and a pacemaker placed by  Dr. Caryl Comes.  She has been on longterm propafenone and Coumadin.   She has a history of hypertension and lower extremity edema.  There is a  distant history of DVT or hematoma on the right leg.  One of her major  concerns today was increasing lower extremity edema.  She does not like  to take her hydrochlorothiazide when she is out and about and active.  I  had prescribed this for her last time.  It does work but she has only  really been taking a half a tablet a day.  I told her we would check her  lower extremity duplex exam for venous insufficiency in light of her  previous DVT and that she should take her diuretic daily as well as  elevate the legs at the end of the day and avoid salt.   We interrogated her pacemaker again today.  She continues to have about  1% of her time being in atrial fibrillation.  She had 32 hours of atrial  fibrillation on her interrogation, some lasting as long as 6 hours.  She  tends to go fairly fast with rates over 100.   Because of this I think she needs to be on longterm Coumadin.  I will  have her see Dr. Caryl Comes to see if there is any other antiarrhythmic  besides propafenone that we may try.  As I recall, she had a problem  with flecainide in the past.  The patient says that Dr. Caryl Comes knows all  about this and called her once to stop flecainide.  I will see what he  thinks about possibly putting her in the hospital for [place].  However,  I think it will be difficult to justify stopping her Coumadin in the  future and from a symptomatic standpoint propafenone seems to be  doing  fairly well.   REVIEW OF SYSTEMS:  Otherwise negative.   PHYSICAL EXAMINATION:  GENERAL:  Remarkable for a healthy-appearing  middle-aged white female in no distress.  VITAL SIGNS:  Weight is 160.  She is currently in sinus rhythm now with  rate of 65, blood pressure is 130/70, afebrile, respiratory rate 14.  HEENT:  Unremarkable.  NECK:  Carotids are normal without bruit, no lymphadenopathy, no  thyromegaly or JVP elevation.  LUNGS:  Clear with diaphragmatic motion.  No wheezing.  HEART:  Pacemaker is under the clavicle in good position.  S1-S2 normal  heart sounds.  PMI normal.  ABDOMEN:  Is benign.  Bowel sounds positive.  No AAA.  No tenderness, no  hepatosplenomegaly.  No hepatojugular reflux.  EXTREMITIES:  Distal pulses are intact, +1 edema in the right lower  extremity.  PTs were +3 bilaterally.  NEUROLOGICAL:  Nonfocal.  SKIN:  Warm and dry.  No muscular weakness.   MEDICATIONS:  1. Include Cardizem 180 a day.  2. Coumadin as directed.  3. Propafenone 123456 b.i.d.  4. Folic acid.  5. Citalopram 20 a day.   IMPRESSION:  1. Pacemaker therapy functioning normally, amplitudes are fine,      battery in good shape.  2. PAF (paroxysmal atrial fibrillation).  Continue propafenone.      Follow up with Dr. Caryl Comes and consider changing over to Tikosyn.      Also consider increasing Cardizem two 240 or 300 a day given the      rapid rates when she is in atrial fibrillation.  3. Anticoagulation.  Continue to follow up in Coumadin clinic.  No      bleeding diathesis or problems.  4. Hypertension currently well controlled.  Continue calcium channel      blocker.  5. Lower extremity edema, previous history of DVT (deep venous      thrombosis).  Check venous duplex.  Continue hydrochlorothiazide 25      a day.  Avoid salt.   I will see the patient back after her venous duplex.  We will also check  a 2-D echocardiogram as it is been over 3 years since she has had one  and I  would like to reassess her RV and LV function given her PAF and  edema.  She will see Dr. Caryl Comes before this regarding the possibility of  changing her antiarrhythmic.     Wallis Bamberg. Johnsie Cancel, MD, [facility]  Electronically Signed    PCN/MedQ  DD: 01/31/2008  DT: 02/01/2008  Job #: [id number]

## 2011-04-11 NOTE — Assessment & Plan Note (Signed)
[facility]                            CARDIOLOGY OFFICE NOTE   NAME:[name] [name], [name] [name]                MRN:          [id number]  DATE:02/17/2009                            DOB:          November 05, 1932    [name] [name] returns today for followup.  She has had mild aortic  insufficiency, mild-to-moderate MR, paroxysmal atrial fibrillation with  sick sinus syndrome, and a pacemaker placed.   Apparently, she saw Dr. [name] recently.  He was concerned about her  cholesterol.   We need to get these results from Dr. [name].  She has had intolerance to  two different statins before with severe arm pain.  I do not think she  can take them.  I told her we would get a copy of her lab work.  She  seems to think that her LDL was in excess of 130.   We talked about some of the options and for the time being she will  start red yeast rice and Niaspan 500 at night.  I did warn her about  flushing at night.  She is on Coumadin, but I think she could tolerate a  baby aspirin if she needs   Otherwise, she is doing well from a heart valve perspective.  There has  been no evidence of SBE.  She does not need prophylaxis.  There has been  no rheumatic fever.  There is no dyspnea.  She does have some chronic  fatigue, which I think is from her sleeping habits.  She tends to stay  up late at night.  There has been no chest pain, PND, orthopnea.  No  lower extremity edema and no diaphoresis and no pleuritic pains.   The patient's pacemaker has been functioning well.  She does not appear  to have any significant recurrences of her AFib currently.  I had Burnett Harry interrogate the pacemaker today.  I have reviewed all of the  parameters with her.  Upper rate limit was set at 130, lower rate limit  was set at 60.  She has had no mode switches, which indicates  maintenance of sinus rhythm.   REVIEW OF SYSTEMS:  All other review of systems reviewed and otherwise  negative.   She  has intolerances to LIPITOR and STATINS.   She is on Coumadin.  Followed in our clinic, propafenone 225 b.i.d.,  vitamin 123456, folic acid, Ativan p.r.n., estradiol, and Cardizem 180 a  day.   PHYSICAL EXAMINATION:  GENERAL:  Remarkable for healthy-appearing female  in no distress.  VITAL SIGNS:  Blood pressure 130/82, pulse 60 and regular, respiratory  rate 14, afebrile.  HEENT:  Unremarkable.  Carotids normal without bruit.  No  lymphadenopathy, thyromegaly, or JVP elevation.  LUNGS:  Clear with good diaphragmatic motion.  No wheezing.  HEART:  S1 and S2 with mild murmur.  PMI normal.  ABDOMEN:  Benign.  Bowel sounds positive.  No AAA, no tenderness, no  bruit.  No hepatosplenomegaly, hepatojugular reflux, or tenderness.  EXTREMITIES:  Distal pulses are intact.  No edema.  NEURO:  Nonfocal.  SKIN:  Warm and dry.  MUSCULOSKELETAL:  No muscular weakness.   Baseline EKG shows sinus rhythm with poor R-wave progression, and she is  not pacemaker dependent and does not pace, at a baseline is usual.   IMPRESSION:  1. Paroxysmal atrial fibrillation.  Continue with Coumadin for the      time being.  She seems to be maintaining sinus on propafenone and      with her pacemaker.  At some point in the future, we may be able to      stop her Coumadin, particularly if her pacer shows no mode      switching.  2. Pacemaker therapy.  Follow up with Dr. Caryl Comes.  Interrogation today      shows the device working well with good battery life.  No mode      switching.  3. Valvular heart disease with MR and AR, currently asymptomatic.      Followup echocardiogram in a year.  No need for subacute bacterial      endocarditis prophylaxis.  4. Hypercholesterolemia.  We will get labs from Dr. [name] to review.      Start Niaspan and red yeast rice.   Overall, I think [name] [name] is doing well.  We will address a new issue of  her cholesterol once I get the results, but I do not think she will be  able to take  statins.     Wallis Bamberg. Johnsie Cancel, MD, [facility]  Electronically Signed    PCN/MedQ  DD: 02/17/2009  DT: 02/18/2009  Job #: [id number]

## 2011-04-11 NOTE — Assessment & Plan Note (Signed)
[facility]                            CARDIOLOGY OFFICE NOTE   NAME:Ellerman, TAIASHA HAYLEY                MRN:          [id number]  DATE:05/07/2007                            DOB:          [birth year]    Ms. Boniface returns today for followup.  I have seen her for PAF.  She has sick sinus syndrome and a pacemaker.   On talking to the patient, she has been doing fairly well.  She has not  had any significant TIAs or CVAs.  Her Coumadin levels have been a  little hard to control.  She does not like being on Coumadin.  She has  had some splotchiness to the lower extremities.   She does not recall any specific palpitations.  I looked over in-depth,  which took more than 10 minutes, her pacemaker interrogations.  She had  one episode of atrial fibrillation lasting about 8 hours in January but  otherwise has not had any significant recurrences.  The propafenone  seems to be doing well.   In talking to the patient, she would be willing to entertain a Coumadin  alternative and we will have the research nurses see her.   Her pacemaker has been functioning well.  Again, looking at her  pacemaker records she really has not been ventricular pacing at all.   The patient's home box is not working well and she will come and see  Amber every three months to check her pacemaker.   She does not have significant structural heart disease that we know of.  She had a nonischemic Myoview in 2007 with normal LV function.  I do not  think she has any significant valvular heart disease.  The patient on  review of systems has had some increasing lower extremity edema.  She  has no history of venous insufficiency or DVTs.  She has been watching  the salt in her diet.  She apparently was given a prescription for a  diuretic for her primary care M.D.   REVIEW OF SYSTEMS:  Otherwise negative.   CURRENT MEDICATIONS:  1. Cardizem 180 mg a day.  2. Coumadin as  directed.  3. Propafenone 225 mg b.i.d.  4. Folic acid.  5. Vitamin C.  6. Citalopram.   ON EXAM:  GENERAL:  She is a healthy-appearing middle-aged female in no  distress.  She is dressed quite nicely.  VITAL SIGNS:  Weight 153 which is stable.  Blood pressure 130/80.  Pulse  70 and regular.  Respiratory rate 12.  She is afebrile.  HEENT:  Normal.  NECK:  Supple.  There is no lymphadenopathy.  No JVP elevation.  No  thyromegaly.  No carotid bruits.  LUNGS:  Clear with normal diaphragmatic motion.  No wheezing.  HEART:  Pacemaker is in good position under the clavicle.  There is an  S1, S2.  Normal heart sounds.  PMI is normal.  ABDOMEN:  Benign.  Bowel sounds are positive.  No tenderness.  No  hepatosplenomegaly.  No hepatojugular reflux.  No AAA.  Femorals are +3  bilaterally without bruits.  EXTREMITIES:  Distal pulses are intact.  She has mild varicosities with  +1 edema bilaterally.  NEURO:  Nonfocal.  SKIN:  Warm and dry.  There is no muscular weakness.   IMPRESSION:  1. Sick sinus syndrome.  Pacemaker appears to be working fine.  Pocket      looks good.  She will follow up with Amber in three months.  2. Paroxysmal atrial fibrillation, only one episode since January.  We      will continue her propafenone at her current dose.  She will be      seen by the research nurses in terms of a Coumadin alternative.  3. Anticoagulation.  Again, as noted, to see research nurses.      Continue Coumadin.  She does not take a baby aspirin a day since      she has known coronary disease.  4. Hypercholesterolemia.  PREVIOUS REACTION TO STATIN DRUGS.  We will      treat with diet only.  If she were to go on a medicine in the      future, then Niaspan would be reasonable but this would be      secondary prevention.  5. Lower extremity edema.  No obvious etiology.  Decrease salt intake.      Continue p.r.n. diuretic.  Follow-up venous duplex for      insufficiency and rule out DVT.  She is  to visit her niece in      [place] and we will do this at the end of June.     Wallis Bamberg. Johnsie Cancel, MD, [facility]  Electronically Signed    PCN/MedQ  DD: 05/07/2007  DT: 05/07/2007  Job #: [id number]

## 2011-04-11 NOTE — Assessment & Plan Note (Signed)
[name] [name]                            CARDIOLOGY OFFICE NOTE   NAME:[name] [name], [name] [name]                MRN:          [id number]  DATE:08/24/2008                            DOB:          February 05, 1932    [name] [name] presents today for followup.  She has had hypertension,  PAF, sick sinus syndrome, pacemaker therapy, lower extremity edema.   She is doing well for lower extremity edema has improved off calcium  channel blocker.  She is getting her pacemaker interrogated next week.  She does not think she has had any recurrent PAF.   She is otherwise doing well, she had a 2-D echocardiogram which showed  normal LV function with mild-to-moderate MR, aortic valve calcification,  and mild aortic insufficiency.   I told her we would follow this up in a year.  She does not need SBE  prophylaxis.   CURRENT MEDICATIONS:  1. Coumadin.  2. Propafenone 123456 b.i.d.  3. Folic acid.  4. Vitamin.  5. Citalopram.   PHYSICAL EXAMINATION:  GENERAL:  Remarkable for healthy-appearing female  in no distress.  VITAL SIGNS:  Weight is 157, blood pressure 130/80, pulse 65 and  regular, respiratory rate 14, afebrile.  HEENT:  Unremarkable.  NECK:  Carotids are normal without bruit.  No lymphadenopathy,  thyromegaly, JVP elevation.  LUNGS:  Clear.  Good diaphragmatic motion.  No wheezing.  HEART:  [id number] with a systolic ejection murmur.  PMI normal.  Pacer in  good position on the left clavicle.  ABDOMEN:  Benign.  Bowel sounds positive.  No AAA, no tenderness, no  bruit, no hepatosplenomegaly, no hepatojugular reflux, no tenderness.  EXTREMITIES:  Distal pulses are intact.  No edema.  No muscular  weakness.  NEUROLOGIC:  Nonfocal.  SKIN:  Warm and dry.   EKG is normal.   IMPRESSION:  1. Sick sinus syndrome, pacemaker working well.  Interrogation next      week.  We will also check to see if she has had any mode switching.  2. Anticoagulation.  Followup in  Coumadin Clinic.  No bleeding      diathesis.  3. Valvular heart disease with aortic and mitral insufficiency.      Followup echo in a year.  4. Hypertension, currently well controlled of calcium blocker.      Continue to monitor low salt diet.  Overall, I think [name] [name] is      doing well.  I will see her back in 6 months.     Wallis Bamberg. Johnsie Cancel, MD, [facility]  Electronically Signed    PCN/MedQ  DD: 08/24/2008  DT: 08/25/2008  Job #: [id number]

## 2011-04-11 NOTE — Letter (Signed)
August 21, 2007    Nehemiah Settle, M.D.  [address], [place] [address]   RE:  [name] [name], [name] [name]  MRN:  [id number]  /  DOB:  10-23-1932   Dear Clair Gulling:   Ms. Presby comes in today. She is doing pretty well. She has  noticed some problems with peripheral edema over the last couple of  weeks. This has been an intermittent problem over years.   She has had no accompanying shortness of breath.   Review of her medications demonstrates. Cardizem 120 mg. She has been on  this for some years for atrial fibrillation that has been rapid.   She has also had this edema off and on over the years and I guess it was  you, or maybe I, gave her a prescription for hydrochlorothiazide that  has been used intermittently.   Her other medications include:  1. Propafenone 225 mg b.i.d.  2. Coumadin.   PHYSICAL EXAMINATION:  VITAL SIGNS:  Blood pressure 120/80, pulse 65.  LUNGS:  Clear.  HEART:  Sounds were regular.  ABDOMEN:  Soft with active bowel sounds.  EXTREMITIES:  1 to 2+ peripheral edema.   Interrogation of her Medtronic In Rhythm pulse generator demonstrates a  P-wave of 0.9 with impedance of 520, threshold of 0.5 and 0.4 with a R-  wave of 4.1, impedance of 400, threshold also 0.5 and 0.4. There has  been only one episode of atrial fibrillation since June and this lasted  35 seconds. There was a 3 to 4 hour episode back in January 2008 with a  rapid ventricular response.   IMPRESSION:  1. Tachy-brady syndrome with atrial fibrillation with a rapid      ventricular response.  2. Status post pacemaker for the bradycardia.  3. Intermittent peripheral edema.  4. Cardizem for rate control.   Clair Gulling, I have suggested that we stop the Cardizem and see whether we can  implicate it. She is quite uncomfortable with her edema, so I have asked  her to take hydrochlorothiazide daily for up to a week until her  symptoms abate. At that point, we will keep her off of the  Cardizem and  she if she has recurrent edema trying to see whether we can implicate it  as the cause of the edema.   Given that her fact that her atrial fibrillation is at this point fairly  well controlled with the Propafenone, I have suggested that we could use  Cardizem on a p.r.n. basis in the event that she has rapid rates. For  right now, I would not subject her to more therapy.   She was agreeable to this.   She is to let us know in the next couple of weeks how her edema does.  Otherwise, we plan to see her again in six months time in the device  clinic.    Sincerely,      Deboraha Sprang, MD, [facility]  Electronically Signed    SCK/MedQ  DD: 08/21/2007  DT: 08/22/2007  Job #: [id number]

## 2011-04-14 NOTE — H&P (Signed)
NAME:  ZALEIGH, SALO NO.:  [id number]   MEDICAL RECORD NO.:  [id number]          PATIENT TYPE:  INP   LOCATION:  3731                         FACILITY:  [facility]   PHYSICIAN:  Fransico Him, M.D.     DATE OF BIRTH:  01-09-1932   DATE OF ADMISSION:  08/24/2005  DATE OF DISCHARGE:                                HISTORY & PHYSICAL   REFERRING PHYSICIAN:  Nehemiah Settle, M.D.   CHIEF COMPLAINT:  The patient is here for drug-loading for paroxysmal atrial  fibrillation.   HISTORY OF PRESENT ILLNESS:  This is a very pleasant 77 year old white  female with a history of paroxysmal atrial fibrillation, also history of  questionable pulmonary hypertension in the past by echo, but right heart  catheterization revealed normal right heart pressures.  She also has a  history of COPD followed by Danton Sewer, M.D. [facility].  The patient has been  having some problems with breakthrough of atrial fibrillation noted on  Holter monitor and was having some pauses all of which were less than 2.5  seconds, but pulse has been in the 50s consistent with tachybrady syndrome.  She has been on Cardizem and intermittent Toprol for palpitations, but we  felt that we could not increase her Toprol any further because of  bradyarrhythmia.  She is now therefore admitted for drug-loading with  Flecainide.  She denies any chest pain, shortness of breath, PND, orthopnea,  or lower extremity edema on examination today.   PAST MEDICAL HISTORY:  1.  Significant for paroxysmal atrial fibrillation, depression, and mild      pulmonary hypertension by two-dimensional echocardiogram but normal      coronary artery pressures by right heart catheterization.  2.  Normal left ventricular systolic function with ejection fraction 55 to      60% on echo in June of 2005.   ALLERGIES:  SPORANOX.   CURRENT MEDICATIONS:  1.  Coumadin which she takes 5 mg every Sunday, Monday, Wednesday, Friday      and 2.5 mg on  Tuesday/Thursday/Saturday.  2.  Lorazepam p.r.n.  3.  Cardizem CD 240 mg a day.  4.  Solutab p.r.n.  5.  Hydrochlorothiazide 25 mg a day.  6.  Klor-Con 10 mEq a day.  7.  Estradiol vaginal cream p.r.n.  8.  Toprol XL 25 mg one to two tablets p.r.n. for palpitations.  9.  Alavert p.r.n.   FAMILY HISTORY:  Her father died at 63 with a history of diabetes from a  pacemaker and hypertension.  Her mother has nonHodgkins lymphoma.  She has a  history of CVA x2 and mitral valve prolapse.  She has one brother who died  at age 46 from a CVA and she has one sister with CAD and one sister alive  and well.   SOCIAL HISTORY:  She is widowed.  She is a mother of one.  She has remote  tobacco use and occasional alcohol use.   REVIEW OF SYSTEMS:  Other than stated in the HPI is negative.   PHYSICAL EXAMINATION:  VITAL SIGNS:  Blood pressure is 130/73, pulse 65,  respirations 20,  she is afebrile.  HEENT:  Benign.  NECK:  Supple without lymphadenopathy.  Carotid upstrokes +2 bilaterally, no  bruits.  LUNGS:  Clear to auscultation throughout.  HEART:  Regular rate and rhythm.  No murmurs, rubs, or gallops.  Normal S1  and S2.  ABDOMEN:  Soft, nontender, and nondistended with active bowel sounds.  No  hepatosplenomegaly.  EXTREMITIES:  No edema.   LABORATORY DATA:  Sodium 139, potassium 3.5, chloride 99, bicarb 29, BUN 11,  creatinine 0.8, glucose 117, INR 1.8.   EKG is pending at time of dictation and two-dimensional echocardiogram done  August 09, 2005, showed normal LV size and systolic function with trivial  aortic insufficiency, TR, and mild MR and trivial PR.   ASSESSMENT:  1.  Paroxysmal atrial fibrillation maintaining sinus rhythm now for drug-      loading with Flecainide due to tachybrady syndrome.  2.  Borderline hypokalemia.  3.  Lower extremity edema.  4.  Systemic anticoagulation with subtherapeutic INR.   PLAN:  Coumadin 5 mg tonight and then pharmacy to dose.  Flecainide  50 mg  b.i.d.  Daily EKG and check QTC.  Continue all medications.  Check PT/INR  every morning.  K-Dur 40 mEq p.o. now and then 20 mEq daily.  Check a BMET  in the morning.      Fransico Him, M.D.  Electronically Signed     TT/MEDQ  D:  08/24/2005  T:  08/24/2005  Job:  [id number]

## 2011-04-14 NOTE — Op Note (Signed)
NAME:  [name] [name], [name] [name]         ACCOUNT NO.:  [id number]   MEDICAL RECORD NO.:  [id number]          PATIENT TYPE:  OIB   LOCATION:  2899                         FACILITY:  [facility]   PHYSICIAN:  Deboraha Sprang, M.D.  DATE OF BIRTH:  03-Mar-1932   DATE OF PROCEDURE:  09/14/2005  DATE OF DISCHARGE:                                 OPERATIVE REPORT   PREOPERATIVE DIAGNOSIS:  Tachy-brady syndrome with sinus rates in the 30s  and atrial fibrillation in the 150s.   POSTOPERATIVE DIAGNOSIS:  Tachy-brady syndrome with sinus rates in the 30s  and atrial fibrillation in the 150s.   PROCEDURE:  Dual chamber pacemaker implantation.   Following obtaining informed consent, the patient was brought to the  electrophysiology laboratory and placed on the fluoroscopic table in supine  position.  After routine prep and drape of the left upper chest, lidocaine  was infiltrated in the prepectoral subclavicular region.  An incision was  made and carried down through the layers to the prepectoral fascia using  electrocautery and blunt dissection.  A pocket was formed similarly.  Hemostasis was obtained.   Next, attention was turned to gaining access to the extrathoracic left  subclavian vein which was accomplished without difficulty without the  aspiration of air or puncture of the artery.  Two separate venipunctures  were accomplished and a 0 silk suture was placed in a figure-of-eight  fashion and 7 French tear away introducer sheaths were placed sequentially  through which were passed a Medtronic 5076/52 cm active fixation ventricular  lead which was manipulated to be placed on the RV septum.  The serial number  was [id number].  The bipolar P wave was 11.5 with a pacing impedance of 624,  threshold of 0.6 volts at 0.5 milliseconds, current threshold 1.2 MA.  This  lead was secured to the prepectoral fascia.  Over the other retained guide-  wire through the placed sheath was placed a St. Jude 1642T/46  cm passive  fixation atrial lead, serial number [id number].  It was manipulated to the  right atrial appendage where the bipolar P wave was 4 millivolts with a  pacing impedance of 585 ohms and a threshold of 0.5 volts at 0.5  milliseconds, current threshold was 0.9 MA.   With these acceptable parameters recorded, the leads were secured to the  prepectoral fascia and attached to a Medtronic InRhythm [id number] pulse  generator, serial number [id number].  P-synchronous pacing was identified.  The pocket was copiously irrigated with antibiotic containing saline  solution.  Hemostasis was assured and the leads and pulse generator were  placed in the pocket and secured to the prepectoral fascia.  The wound was  closed in three  layers in a normal fashion.  The wound was washed, dried, and a Benzoin and  Steri-Strip dressing was applied.  Needle counts, sponge counts, and  instrument counts were correct at the end of the procedure according to the  staff.  The patient tolerated the procedure without apparent complications.           ______________________________  Deboraha Sprang, M.D.     SCK/MEDQ  D:  09/14/2005  T:  09/14/2005  Job:  [id number]   cc:   Fransico Him, M.D.  Fax: [phone]

## 2011-04-14 NOTE — Letter (Signed)
December 28, 2006    Nehemiah Settle, M.D.  [name] [place], [place] [address]   RE:  [name] [name], [name] [name]  MRN:  [id number]  /  DOB:  1932-01-22   Dear Clair Gulling:   [name] came in today.  She is continuing on her propafenone for  her tachybrady syndrome with backup bradycardia pacing.  She has had two  episodes of atrial fibrillation in the last 6 months, notably that they  were fast; they were right around Christmastime.  When she did, she took  an extra one of her diltiazem.  I have assured her that that is a  reasonable thing to do.   On examination, her blood pressure is 132/86, pulse was 60.  Lungs were  clear, heart sounds were regular, and extremities were without edema.   Interrogation of her Medtronic EnRhythm device demonstrates a P wave of  2.7 with impedance of 712, a threshold of 0.5 at 0.4.  The R wave is 3.4  with impedance of 424 and a threshold of 1 at 0.4.  Battery voltage was  3.02.  Device was reprogrammed to maximize longevity.   IMPRESSION:  1. Atrial fibrillation - paroxysmal.  2. Bradycardia.  3. Status post pacer for the above.  4. Thromboembolic risk factors notable for:      a.     Age.      b.     Hypertension.      c.     Prior lacunar infarct.   [name] is stable, Clair Gulling.  Please let us know if there is anything  else we can do.  We will plan to see her again in a year.  She will be  followed remotely via the [facility] quarterly.    Sincerely,      Deboraha Sprang, MD, [facility]  Electronically Signed    SCK/MedQ  DD: 12/28/2006  DT: 12/28/2006  Job #: [id number]

## 2011-04-14 NOTE — Discharge Summary (Signed)
NAME:  [name] [name], [name] [name] NO.:  [id number]   MEDICAL RECORD NO.:  [id number]          PATIENT TYPE:  OIB   LOCATION:  [id number]                         FACILITY:  [facility]   PHYSICIAN:  Deboraha Sprang, M.D.  DATE OF BIRTH:  24-Mar-1932   DATE OF ADMISSION:  09/14/2005  DATE OF DISCHARGE:  09/15/2005                                 DISCHARGE SUMMARY   CARDIOLOGIST:  Fransico Him, M.D.   ALLERGIES:  Sporanox.   DISCHARGE DIAGNOSES:  1.  Discharging day 1 status post implantation of Medtronic EnRhythm      pacemaker.  2.  Sinus node dysfunction with pauses on rate control medications for      atrial fibrillation.  3.  Tachy-palpitations with fatigue and weakness with atrial fibrillation      rapid ventricular rate paroxysms.  4.  Medication adjustment for rate control this hospitalization.      1.  Rythmol 225 mg twice daily.      2.  Cardizem 180 mg daily.  5.  The patient had transient dizziness and nausea when first getting out of      bed this morning, post procedure day 1. This has improved.  6.  The pacemaker interrogation demonstrates a change in the P-R wave.      Immediately after implantation, the P-R waves were in the atrium 4.0,      and the ventricle 11.5. On the morning of September 15, 2005 before      discharge, the P-R waves were 1.4 in the atrium and 5.1 in the      ventricle.   SECONDARY DIAGNOSIS:  Neurologic episodes with fall to the left side. MRI at  [facility] shows lacunar infarct on the right cerebellum and age related  atrophy.   PROCEDURE:  On September 14, 2005 - Implantation of Medtronic EnRhythm  pacemaker by Dr. Virl Axe for sinus node dysfunction and tachy-  bradycardia syndrome.   DISPOSITION:  [name] [name] discharging September 15, 2005. She had a  transient spell of nausea and dizziness when first getting out of bed this  morning and being wheeled to x-ray. This has passed. Chest x-ray shows that  the leads are appropriate and  there is no pneumothorax. The device has been  interrogated with a finding of a decrease in the P-R wave measurements, as  indicated above. The atrium is 1.4, ventricle 5.1. This is acceptable at the  present time but she will need followup closely to see if they would  continue to fall. The patient's incision is healing nicely. There is no  hematoma. The patient has no rub on auscultation of the heart. She is A-  pacing currently at the time of discharge. Blood pressure is 113/74. She is  achieving 97% oxygen saturation on room air. Heart rate is 60. She  discharges on the following medications.   DISCHARGE MEDICATIONS:  1.  Rythmol 225 mg 1 tablet in the morning and 1 tablet in the evening. This      is new medication. Dosage may have to go up to 1 every 8 hours if  needed. Dr. Caryl Comes will decide this.  2.  Cardizem 180 mg daily. This is new. The patient also has Cardizem 30 mg      to take if symptoms of palpitation recur.  3.  Coumadin per Dr. [name].  4.  Ativan as needed.  5.  The patient does not need to continue taking hydrochlorothiazide or      potassium.   FOLLOW UP:  1.  She follows up with the pacer clinic on Monday, September 18, 2005 at 1:30      to assess the P-R wave values.  2.  Pacer clinic on Monday, October 02, 2005 at 9:00 in the morning.  3.  She will see Dr. Caryl Comes on January 09, 2006 at 10:50 in the morning.   SPECIAL INSTRUCTIONS:  She is asked to keep her incision dry for the next 7  days. To sponge bathe until Thursday, September 21, 2005. Mobility of the left  upper extremity has been described to the patient.   DISCHARGE MEDICATIONS:  Pain medication is Tylenol 325 mg 1 to 2 tablets  daily.   HISTORY OF PRESENT ILLNESS:  The patient is a 77 year old female with a  history of atrial fibrillation that has become markedly worse. She has  paroxysms. She notes them as palpitations and she gets fatigue and weakness  with them. She does not get short of  breath, light headedness or have chest  discomfort. She was recently admitted to [facility] to begin  Flecainide therapy. She was also started on Cardizem. With these  medications, she has apparently had some very slow rates in the 30's and  below. She also apparently had a 3 to 4 second pause. Because of this, her  Cardizem was stopped and the Flecainide dose was increased. A new rhythm  recorder was placed and shows heart rates in the 30's as well as heart rates  in the 180's, when the patient has paroxysms of atrial fibrillation. There  is sinus node dysfunction with pausing. The patient will be a candidate for  pacemaker placement and then improved rate control. The risks and benefits  of implantation of pacemaker have been described to the patient by Dr.  Virl Axe and the patient wishes to proceed. She will present electively  September 14, 2005.   HOSPITAL COURSE:  The patient presented September 14, 2005. She had  implantation of permanent pacemaker by Dr. Caryl Comes. She has had some transient  nausea and dizziness after the procedure. This has resolved. Medications  have been adjusted as dictated above and she will have a close followup in  the pacer clinic to check the P-R wave values on Monday, September 18, 2005.      [name] [name], P.A.    ______________________________  Deboraha Sprang, M.D.    GM/MEDQ  D:  09/15/2005  T:  09/15/2005  Job:  [id number]   cc:   Fransico Him, M.D.  Fax: [phone]   Nehemiah Settle, M.D.  Fax: [phone]

## 2011-04-14 NOTE — Assessment & Plan Note (Signed)
[name]                            CARDIOLOGY OFFICE NOTE   NAME:[name], [name]                MRN:          [id number]  DATE:11/01/2006                            DOB:          1932/05/03    [name] [name] is seen today in followup.  She has had paroxysmal atrial  fibrillation, has been maintained on propafenone and Coumadin.   She is to be seen by [name] and [name] [name].  She continues to  see [name] [name].   She has no history of coronary artery disease, and had a benign left and  right heart cath in 2005.  She is a nonsmoker.  She has had  hypercholesterolemia.  I believe [name] [name] has her most recent  cholesterol, however, the patient has had fairly significant myalgias  with 2 different statin drugs.  I told her since she has no documented  coronary disease, I would not necessarily try a third statin drug since  it is likely to be a class effect for her.  Apparently, [name] [name] also  did a carotid duplex on her and it showed some plaque.  I suspect that  this is minimal, less than 20% disease.  I told [name] [name] that I  would not try her on the statin drug.  If [name] [name] feels she needs  therapy for her cholesterol, I would rather see her try Zetia or  Niaspan.   From a cardiac perspective, she is otherwise stable.  Her Coumadin level  has been excellent.  She does occasionally get palpitations, they are  not frequent, maybe once or twice a month, and only last a minute.  I do  not think she needs a home event monitor.  She has 30 mg tablets of  short-acting Cardizem which she can take.  Her propafenone dose has been  stable, and her QT interval has been in the 416 range in the past.   REVIEW OF SYSTEMS:  Remarkable for some left-sided hip pain.   I told her that [name] [name] was an excellent hip surgeon in town.  It sounds like she may have a bit of sciatica.  She also bumped her head  in the rafters getting  Christmas decorations down.  It is currently well-  healed, and there is no evidence of subdural.   When I last saw her she had multiple myalgias and complaints.  She had a  CT of the chest that showed no PE and no abnormal lung findings.  She  had a head CT done August 10 which was normal.   She also had a Myoview which was normal.   MEDICATIONS:  Her medications are listed on the chart.  They include:  1. Cardizem 180 a day.  2. Coumadin as directed.  3. Propafenone 225 b.i.d..   PHYSICAL EXAMINATION:  On exam, she looks well.  She is in sinus rhythm.  The rate is 60.  HEENT:  Normal.  There is no significant hematoma where she hit her  head.  Pupils are equal, round, reactive to light and accommodation.  LUNGS:  Clear.  There is JVP  elevation S1, S2, normal heart sounds.  ABDOMEN:  Benign.  EXTREMITIES:  Intact pulses, no edema.  NEURO:  Nonfocal.   IMPRESSION:  Stable paroxysmal atrial fibrillation.  Continue on  anticoagulation with Coumadin.   She has required antiarrhythmics per [name] [name], and she will stay on her  propafenone.  She has the pacemaker in place for her tachybrady  syndrome, and has pacemaker backup so there should not be any risk for  bradycardia.   Will try to get the report of her carotid duplex from [name] [name], but I  honestly do not think she should have another statin trial.  We are  essentially dealing with secondary prevention here in an elderly woman  who has had severe myalgias on statins.  Zetia or Niaspan would be an  alternative.  She will continue to followup in Coumadin clinic for  anticoagulation.   She has had atypical chest pain with a normal cath in 2005, and a  nonischemic Myoview.     [name] [name]. [name] Cancel, MD, [facility]  Electronically Signed    PCN/MedQ  DD: 11/01/2006  DT: 11/01/2006  Job #: [id number]

## 2011-04-14 NOTE — Discharge Summary (Signed)
NAME:  [name] [name], [name] [name] NO.:  [id number]   MEDICAL RECORD NO.:  [id number]          PATIENT TYPE:  INP   LOCATION:  [id number]                         FACILITY:  [facility]   PHYSICIAN:  Fransico Him, M.D.     DATE OF BIRTH:  May 10, 1932   DATE OF ADMISSION:  08/24/2005  DATE OF DISCHARGE:  08/29/2005                                 DISCHARGE SUMMARY   CHIEF COMPLAINT/REASON FOR ADMISSION:  [name] is a 77 year old  patient with history of  PAF, not suppressed on beta blockers and calcium  channel blockers.  She is being admitted for drug loading with Flecainide.  Exam was unremarkable except for a moderately decreased potassium at 3.5,  INR 1.8.   DIAGNOSES:  The patient was admitted with the following diagnoses;  1.  Paroxysmal atrial fibrillation.  Admit for flecainide drug load.  2.  Borderline hypokalemia.  3.  Lower extremity edema.  4.  Systemic anticoagulation with subtherapeutic INR.   HOSPITAL COURSE:  1.  PAF DRUG LOAD.  The patient was admitted to the telemetry  unit where      she was started on flecainide 50 b.i.d., daily EKGs, daily INR.  She was      repleted with 40 of potassium immediately and then started on 20 mEq of      potassium daily to keep potassium greater than 4 with initiation of      antiarrhythmic therapy.  Other home medications were continued.  Her      BMET was followed in a regular basis.   On solid note potassium was  up to 4.  She had been given extra Coumadin and  her INR was 2.1.  She was in normal sinus rhythm on September 28.  QTC 453  with sinus bradycardia.  Heart rate in the 50s.  Flecainide was increased to  100 mg b.i.d.  She was continued in the hospital until she could at least  complete five doses of flecainide.   On August 26, 2005, she continued to have recurrent atrial fibrillation  with ventricular rate in the 130s.  Eventually she converted back to sinus  rhythm after being given Cardizem p.o.  Her  flecainide was adjusted.   By August 27, 2005 the patient had no further evidence of atrial  fibrillation but she did have significant pause after she was given  Cardizem.  This was a 5-second pause.  The patient felt faint with this but  did not experience syncope.  Blood pressure was 100/66, heart rate was in  the 50s or 60s.  Subsequently her Cardizem was decreased to 120 p.o. w.  She  was continued on low dose flecainide.  Her INR was 2.5.  Her Coumadin was  placed on hold for consideration of possible PTVP for tachycardic right  syndrome.   By August 28 2005 she had no further episodes of bradycardia of pauses.  Vital signs were stable.  INR was 2.9.  Flecainide was increased back up to  100 b.i.d.  QTC on August 29, 2005 showed it was 470 msec.  INR was back  down to 1.5.  Dr. Radford Pax felt she was appropriate for discharge home.  She  was continued on Coumadin.  Plans were to follow up in the office for an EKG  check that week as well as at the Coumadin clinic for INR checks, and  supplemental potassium was also added.   FINAL DISCHARGE DIAGNOSES:  1.  PAF, status post successful flecainide load.  2.  Bradycardia with pauses secondary to  Cardizem.  3.  Systemic anticoagulation with subtherapeutic INR.  4.  Transient hypokalemia.  5.  Lower extremity edema, stable.   DISCHARGE MEDICATIONS:  1.  Flecainide 100 mg b.i.d.  2.  Coumadin 5 mg on date of discharge, then 5 mg on Monday, Wednesday, and      Thursday, and Friday, with 2.5 mg on Tuesdays and Saturdays.  3.  Hydrochlorothiazide 25 mg daily.  4.  K-Dur 20 mEq in the mornings and 10 mEq at night.  5.  Cardizem CD 120 daily.   DIET:  No restrictions.   ACTIVITY:  No restrictions.   WOUND CARE:  Not applicable.   FOLLOW UP:  Appointment with Dr. Radford Pax on October 20 at 2 p.m.  She has got  an EKG check in Coumadin clinic as well as BMET done on October 5 at 11:30.      [place]enny [place] Ruel Favors, M.D.  Electronically Signed    ALE/MEDQ  D:  09/12/2005  T:  09/12/2005  Job:  [id number]

## 2011-04-25 ENCOUNTER — Ambulatory Visit (INDEPENDENT_AMBULATORY_CARE_PROVIDER_SITE_OTHER): Payer: Medicare Other | Admitting: *Deleted

## 2011-04-25 DIAGNOSIS — I82409 Acute embolism and thrombosis of unspecified deep veins of unspecified lower extremity: Secondary | ICD-10-CM

## 2011-04-25 DIAGNOSIS — I4891 Unspecified atrial fibrillation: Secondary | ICD-10-CM

## 2011-05-12 ENCOUNTER — Other Ambulatory Visit: Payer: Self-pay | Admitting: Cardiology

## 2011-05-12 ENCOUNTER — Other Ambulatory Visit: Payer: Self-pay | Admitting: Internal Medicine

## 2011-05-16 ENCOUNTER — Ambulatory Visit (INDEPENDENT_AMBULATORY_CARE_PROVIDER_SITE_OTHER): Payer: Medicare Other | Admitting: *Deleted

## 2011-05-16 DIAGNOSIS — I82409 Acute embolism and thrombosis of unspecified deep veins of unspecified lower extremity: Secondary | ICD-10-CM

## 2011-05-16 DIAGNOSIS — I4891 Unspecified atrial fibrillation: Secondary | ICD-10-CM

## 2011-06-07 ENCOUNTER — Other Ambulatory Visit: Payer: Self-pay | Admitting: Internal Medicine

## 2011-06-13 ENCOUNTER — Ambulatory Visit (INDEPENDENT_AMBULATORY_CARE_PROVIDER_SITE_OTHER): Payer: Medicare Other | Admitting: *Deleted

## 2011-06-13 DIAGNOSIS — I4891 Unspecified atrial fibrillation: Secondary | ICD-10-CM

## 2011-06-13 DIAGNOSIS — I82409 Acute embolism and thrombosis of unspecified deep veins of unspecified lower extremity: Secondary | ICD-10-CM

## 2011-06-13 LAB — POCT INR: INR: 3.1

## 2011-06-26 ENCOUNTER — Other Ambulatory Visit: Payer: Self-pay | Admitting: Cardiovascular Disease

## 2011-07-04 ENCOUNTER — Ambulatory Visit (INDEPENDENT_AMBULATORY_CARE_PROVIDER_SITE_OTHER): Payer: Medicare Other | Admitting: *Deleted

## 2011-07-04 DIAGNOSIS — I4891 Unspecified atrial fibrillation: Secondary | ICD-10-CM

## 2011-07-04 DIAGNOSIS — I82409 Acute embolism and thrombosis of unspecified deep veins of unspecified lower extremity: Secondary | ICD-10-CM

## 2011-07-04 LAB — POCT INR: INR: 2.8

## 2011-07-12 ENCOUNTER — Other Ambulatory Visit: Payer: Self-pay | Admitting: Internal Medicine

## 2011-07-18 ENCOUNTER — Encounter: Payer: Medicare Other | Admitting: *Deleted

## 2011-07-21 ENCOUNTER — Ambulatory Visit (INDEPENDENT_AMBULATORY_CARE_PROVIDER_SITE_OTHER): Payer: Medicare Other | Admitting: *Deleted

## 2011-07-21 DIAGNOSIS — I82409 Acute embolism and thrombosis of unspecified deep veins of unspecified lower extremity: Secondary | ICD-10-CM

## 2011-07-21 DIAGNOSIS — I4891 Unspecified atrial fibrillation: Secondary | ICD-10-CM

## 2011-08-07 ENCOUNTER — Other Ambulatory Visit: Payer: Self-pay | Admitting: Internal Medicine

## 2011-08-09 ENCOUNTER — Other Ambulatory Visit: Payer: Self-pay | Admitting: *Deleted

## 2011-08-09 MED ORDER — PROPAFENONE HCL 225 MG PO TABS: 225.0000 mg | ORAL_TABLET | Freq: Two times a day (BID) | ORAL | Status: DC

## 2011-08-14 ENCOUNTER — Other Ambulatory Visit: Payer: Self-pay | Admitting: Cardiovascular Disease

## 2011-08-15 ENCOUNTER — Encounter: Payer: Medicare Other | Admitting: *Deleted

## 2011-08-18 ENCOUNTER — Encounter: Payer: Medicare Other | Admitting: *Deleted

## 2011-08-22 ENCOUNTER — Ambulatory Visit (INDEPENDENT_AMBULATORY_CARE_PROVIDER_SITE_OTHER): Payer: Medicare Other | Admitting: *Deleted

## 2011-08-22 DIAGNOSIS — I82409 Acute embolism and thrombosis of unspecified deep veins of unspecified lower extremity: Secondary | ICD-10-CM

## 2011-08-22 DIAGNOSIS — I4891 Unspecified atrial fibrillation: Secondary | ICD-10-CM

## 2011-09-07 ENCOUNTER — Encounter: Payer: Self-pay | Admitting: Internal Medicine

## 2011-09-07 ENCOUNTER — Ambulatory Visit (INDEPENDENT_AMBULATORY_CARE_PROVIDER_SITE_OTHER): Payer: Medicare Other | Admitting: Internal Medicine

## 2011-09-07 DIAGNOSIS — I495 Sick sinus syndrome: Secondary | ICD-10-CM

## 2011-09-07 DIAGNOSIS — I4891 Unspecified atrial fibrillation: Secondary | ICD-10-CM

## 2011-09-07 DIAGNOSIS — Z01810 Encounter for preprocedural cardiovascular examination: Secondary | ICD-10-CM

## 2011-09-07 DIAGNOSIS — Z950 Presence of cardiac pacemaker: Secondary | ICD-10-CM

## 2011-09-07 LAB — PACEMAKER DEVICE OBSERVATION
AL AMPLITUDE: 1.7896 mv
AL THRESHOLD: 0.5 V
BAMS-0001: 170 {beats}/min
RV LEAD AMPLITUDE: 3.7242 mv
RV LEAD THRESHOLD: 0.5 V

## 2011-09-07 NOTE — Assessment & Plan Note (Signed)
He has minimal symptoms. She should be an acceptable risk for surgery.

## 2011-09-07 NOTE — Patient Instructions (Signed)
Your physician wants you to follow-up in: 1 year. You will receive a reminder letter in the mail two months in advance. If you don't receive a letter, please call our office to schedule the follow-up appointment.  

## 2011-09-07 NOTE — Progress Notes (Signed)
  HPI  [name] [name] is a 77 y.o. 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  She is on relatively well notwithstanding a great deal of stress related to her moving.   She is on coumadin.    She needs eye surgery  She has had no significant sob or chest pain   Past Medical History  Diagnosis Date  . Acute venous embolism and thrombosis of unspecified deep vessels of lower extremity   . Sinoatrial node dysfunction   . Mitral valve insufficiency and aortic valve insufficiency   . Edema   . Atrial fibrillation   . Unspecified essential hypertension   . Valvular heart disease   . Depression     No past surgical history on file.  Current Outpatient Prescriptions  Medication Sig Dispense Refill  . BIOTIN FORTE PO Take by mouth as needed.        . calcium & magnesium carbonates (MYLANTA) 311-232 MG per tablet Take 1 tablet by mouth daily.        . Cholecalciferol (VITAMIN D3) 2000 UNITS TABS Take by mouth daily.        . colchicine (COLCRYS) 0.6 MG tablet Take 0.6 mg by mouth as needed.        . Cyanocobalamin (VITAMIN B-12 PO) Take by mouth daily.        Marland Kitchen diltiazem (CARDIZEM) 30 MG tablet Take 30 mg by mouth as needed.        . folic acid (FOLVITE) 1 MG tablet Take 1 mg by mouth daily.        Marland Kitchen LORazepam (ATIVAN) 0.5 MG tablet Take 0.5 mg by mouth as needed.        . propafenone (RYTHMOL) 225 MG tablet Take 1 tablet (225 mg total) by mouth 2 (two) times daily.  180 tablet  3  . Risedronate Sodium (ATELVIA) 35 MG TBEC Take by mouth once a week.        . sertraline (ZOLOFT) 50 MG tablet Take 25 mg by mouth daily.        Marland Kitchen warfarin (COUMADIN) 5 MG tablet USE AS DIRECTED BY COAGULATION CLINIC  30 tablet  3    Allergies  Allergen Reactions  . Itraconazole     Review of Systems negative except from HPI and PMH  Physical Exam Well developed and well nourished  in no acute distress HENT normal E scleral and icterus clear Neck Supple JVP flat; carotids brisk and full Clear to ausculation Regular rate and rhythm, no murmurs gallops or rub Soft with active bowel sounds No clubbing cyanosis and edema Alert and oriented, grossly normal motor and sensory function Skin Warm and Dry   Assessment and  Plan

## 2011-09-07 NOTE — Assessment & Plan Note (Signed)
Stable with recurrences noted on her pacemaker; she is on anticoagulation .

## 2011-09-07 NOTE — Assessment & Plan Note (Signed)
The patient's device was interrogated.  The information was reviewed. No changes were made in the programming.    

## 2011-09-08 ENCOUNTER — Other Ambulatory Visit: Payer: Self-pay | Admitting: Internal Medicine

## 2011-09-08 MED ORDER — DILTIAZEM HCL 30 MG PO TABS: 30.0000 mg | ORAL_TABLET | ORAL | Status: DC | PRN

## 2011-09-19 ENCOUNTER — Telehealth: Payer: Self-pay

## 2011-09-19 ENCOUNTER — Ambulatory Visit (INDEPENDENT_AMBULATORY_CARE_PROVIDER_SITE_OTHER): Payer: Medicare Other | Admitting: *Deleted

## 2011-09-19 DIAGNOSIS — I4891 Unspecified atrial fibrillation: Secondary | ICD-10-CM

## 2011-09-19 DIAGNOSIS — I82409 Acute embolism and thrombosis of unspecified deep veins of unspecified lower extremity: Secondary | ICD-10-CM

## 2011-09-19 NOTE — Telephone Encounter (Signed)
Pt recently refilled Diltiazem 30mg  tablets and when refilled only received 30 tablets with take 1 tablet daily as needed.  Previous rx from Dr Caryl Comes for Diltiazem 30mg  states take 1-2 tablets every 6 hrs as needed and received a much larger quantity.  Pt wanted to clarify she should take Diltiazem prn as previously instructed by Dr Caryl Comes.  Please advise.

## 2011-09-19 NOTE — Telephone Encounter (Signed)
Dr. Caryl Comes, how do you want her prescription to read?

## 2011-09-20 MED ORDER — DILTIAZEM HCL 30 MG PO TABS: ORAL_TABLET | ORAL | Status: DC

## 2011-09-20 NOTE — Telephone Encounter (Signed)
dilt 30mg  1-2 po q6h prn tachypalpitations disp #60

## 2011-09-20 NOTE — Telephone Encounter (Signed)
Called spoke with pt advised per Dr Caryl Comes dosage instructions for Diltiazem 30mg  should be take 1-2 tablets po every 6 hrs as needed for palpitations.  Sent in new rx to [facility] #60. Pt aware.

## 2011-10-17 ENCOUNTER — Encounter: Payer: Medicare Other | Admitting: *Deleted

## 2011-10-20 ENCOUNTER — Encounter: Payer: Medicare Other | Admitting: *Deleted

## 2011-10-27 ENCOUNTER — Ambulatory Visit (INDEPENDENT_AMBULATORY_CARE_PROVIDER_SITE_OTHER): Payer: Medicare Other | Admitting: *Deleted

## 2011-10-27 DIAGNOSIS — I82409 Acute embolism and thrombosis of unspecified deep veins of unspecified lower extremity: Secondary | ICD-10-CM

## 2011-10-27 DIAGNOSIS — I4891 Unspecified atrial fibrillation: Secondary | ICD-10-CM

## 2011-11-17 ENCOUNTER — Encounter: Payer: Medicare Other | Admitting: *Deleted

## 2011-11-20 ENCOUNTER — Ambulatory Visit (INDEPENDENT_AMBULATORY_CARE_PROVIDER_SITE_OTHER): Payer: Medicare Other | Admitting: *Deleted

## 2011-11-20 DIAGNOSIS — I4891 Unspecified atrial fibrillation: Secondary | ICD-10-CM

## 2011-11-20 DIAGNOSIS — I82409 Acute embolism and thrombosis of unspecified deep veins of unspecified lower extremity: Secondary | ICD-10-CM

## 2011-11-29 ENCOUNTER — Telehealth: Payer: Self-pay | Admitting: Cardiovascular Disease

## 2011-11-29 NOTE — Telephone Encounter (Signed)
All Cardiac faxed to [facility] @ [phone] 11/29/11/KM

## 2011-12-04 ENCOUNTER — Encounter: Payer: Medicare Other | Admitting: *Deleted

## 2011-12-11 ENCOUNTER — Other Ambulatory Visit: Payer: Self-pay | Admitting: Cardiovascular Disease

## 2011-12-12 DIAGNOSIS — J410 Simple chronic bronchitis: Secondary | ICD-10-CM | POA: Diagnosis not present

## 2011-12-19 DIAGNOSIS — Z1231 Encounter for screening mammogram for malignant neoplasm of breast: Secondary | ICD-10-CM | POA: Diagnosis not present

## 2011-12-19 DIAGNOSIS — H04129 Dry eye syndrome of unspecified lacrimal gland: Secondary | ICD-10-CM | POA: Diagnosis not present

## 2011-12-20 ENCOUNTER — Ambulatory Visit (INDEPENDENT_AMBULATORY_CARE_PROVIDER_SITE_OTHER): Payer: Medicare Other | Admitting: *Deleted

## 2011-12-20 DIAGNOSIS — I82409 Acute embolism and thrombosis of unspecified deep veins of unspecified lower extremity: Secondary | ICD-10-CM

## 2011-12-20 DIAGNOSIS — Z7901 Long term (current) use of anticoagulants: Secondary | ICD-10-CM

## 2011-12-20 DIAGNOSIS — I4891 Unspecified atrial fibrillation: Secondary | ICD-10-CM

## 2011-12-20 DIAGNOSIS — R928 Other abnormal and inconclusive findings on diagnostic imaging of breast: Secondary | ICD-10-CM | POA: Diagnosis not present

## 2012-01-02 ENCOUNTER — Ambulatory Visit (INDEPENDENT_AMBULATORY_CARE_PROVIDER_SITE_OTHER): Payer: Medicare Other | Admitting: *Deleted

## 2012-01-02 DIAGNOSIS — I4891 Unspecified atrial fibrillation: Secondary | ICD-10-CM | POA: Diagnosis not present

## 2012-01-02 DIAGNOSIS — I82409 Acute embolism and thrombosis of unspecified deep veins of unspecified lower extremity: Secondary | ICD-10-CM

## 2012-01-02 LAB — POCT INR: INR: 2.6

## 2012-01-25 ENCOUNTER — Ambulatory Visit (INDEPENDENT_AMBULATORY_CARE_PROVIDER_SITE_OTHER): Payer: Medicare Other | Admitting: Pharmacist

## 2012-01-25 DIAGNOSIS — I82409 Acute embolism and thrombosis of unspecified deep veins of unspecified lower extremity: Secondary | ICD-10-CM | POA: Diagnosis not present

## 2012-01-25 DIAGNOSIS — I4891 Unspecified atrial fibrillation: Secondary | ICD-10-CM | POA: Diagnosis not present

## 2012-02-16 ENCOUNTER — Ambulatory Visit (INDEPENDENT_AMBULATORY_CARE_PROVIDER_SITE_OTHER): Payer: Medicare Other | Admitting: *Deleted

## 2012-02-16 DIAGNOSIS — I4891 Unspecified atrial fibrillation: Secondary | ICD-10-CM | POA: Diagnosis not present

## 2012-02-16 DIAGNOSIS — I82409 Acute embolism and thrombosis of unspecified deep veins of unspecified lower extremity: Secondary | ICD-10-CM

## 2012-02-20 DIAGNOSIS — H2510 Age-related nuclear cataract, unspecified eye: Secondary | ICD-10-CM | POA: Diagnosis not present

## 2012-03-06 ENCOUNTER — Encounter: Payer: Self-pay | Admitting: Internal Medicine

## 2012-03-06 ENCOUNTER — Encounter: Payer: Self-pay | Admitting: Cardiovascular Disease

## 2012-03-06 ENCOUNTER — Ambulatory Visit (INDEPENDENT_AMBULATORY_CARE_PROVIDER_SITE_OTHER): Payer: Medicare Other | Admitting: Cardiovascular Disease

## 2012-03-06 ENCOUNTER — Ambulatory Visit (INDEPENDENT_AMBULATORY_CARE_PROVIDER_SITE_OTHER): Payer: Medicare Other | Admitting: *Deleted

## 2012-03-06 VITALS — BP 116/69 | HR 68 | Wt 158.0 lb

## 2012-03-06 DIAGNOSIS — Z950 Presence of cardiac pacemaker: Secondary | ICD-10-CM | POA: Diagnosis not present

## 2012-03-06 DIAGNOSIS — I4891 Unspecified atrial fibrillation: Secondary | ICD-10-CM | POA: Diagnosis not present

## 2012-03-06 DIAGNOSIS — I82409 Acute embolism and thrombosis of unspecified deep veins of unspecified lower extremity: Secondary | ICD-10-CM | POA: Diagnosis not present

## 2012-03-06 LAB — PACEMAKER DEVICE OBSERVATION
AL IMPEDENCE PM: 568 Ohm
AL THRESHOLD: 1 V
ATRIAL PACING PM: 18.03
BAMS-0001: 170 {beats}/min
RV LEAD AMPLITUDE: 4.4013 mv
RV LEAD THRESHOLD: 1 V

## 2012-03-06 LAB — POCT INR: INR: 2

## 2012-03-06 NOTE — Progress Notes (Signed)
Patient presents for device clinic pacemaker check.  No problems with shortness of breath, chest pain, palpitations, or syncope.  Device interrogated and found to be functioning normally.  No changes made today.  See PaceArt report for full details.  Plan ROV with Dr. Caryl Comes in 6 months.  Chanetta Marshall, RN, BSN 03/06/2012 11:14 AM

## 2012-03-06 NOTE — Progress Notes (Signed)
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 She has had Very infrequent episodes of palpitations; notably these did not correlate with arrhythmias identified by her pacemaker . I reveiwed her PaceArt interogation with [name] [name] [name]. Battery Voltage is fine. In afib less than 1 % of time and lead impedences are ok. Overall she's doing pretty well apart fro life stressers. She is widowed, sold her house and Her sister recently died of a subdural She indicates that [name] [name] is concerned about her cholesterol and BS. She has not seen her primary [name] [name] in years. Last visit we tried her on very low dose crestor 2.5 mg and like all other statins she had myalgias. Given her age and lack of vascular disease I told her diet Rx should suffice.   Needs eye surgery but has a URI and she postponed it  Coumadin does not need to be stopped for it  Reviewed office notes from [name] [name] her OB/GYN that acts as her primary. Only complaints then were irritable bowel and vulva pruritis.  ROS: Denies fever, malais, weight loss, blurry vision, decreased visual acuity, cough, sputum, SOB, hemoptysis, pleuritic pain, palpitaitons, heartburn, abdominal pain, melena, lower extremity edema, claudication, or rash.  All other systems reviewed and negative  General: Affect appropriate Healthy:  appears stated age 78: normal Neck supple with no adenopathy JVP normal no bruits no thyromegaly Lungs clear with no wheezing and good diaphragmatic motion Heart:  S1/S2 no murmur, no rub, gallop or click PMI normal Abdomen: benighn, BS positve, no tenderness, no AAA no bruit.  No HSM or HJR Distal pulses intact with no bruits No edema Neuro non-focal Skin warm and dry No muscular weakness Pacer under left clavicle   Current Outpatient Prescriptions  Medication Sig Dispense Refill  . BIOTIN FORTE PO Take by  mouth as needed.        . Cholecalciferol (VITAMIN D3) 2000 UNITS TABS Take by mouth daily.        . colchicine (COLCRYS) 0.6 MG tablet Take 0.6 mg by mouth as needed.        . Cyanocobalamin (VITAMIN B-12 PO) Take by mouth daily.        Marland Kitchen diltiazem (CARDIZEM) 30 MG tablet Take 1-2 tablets by mouth every 6 hours as needed for palpitations  60 tablet  6  . folic acid (FOLVITE) 1 MG tablet Take 1 mg by mouth daily.        Marland Kitchen LORazepam (ATIVAN) 0.5 MG tablet Take 0.5 mg by mouth as needed.        . propafenone (RYTHMOL) 225 MG tablet Take 1 tablet (225 mg total) by mouth 2 (two) times daily.  180 tablet  3  . RESTASIS 0.05 % ophthalmic emulsion PRN      . Risedronate Sodium (ATELVIA) 35 MG TBEC Take by mouth once a week.        . warfarin (COUMADIN) 5 MG tablet USE AS DIRECTED BY COAGULATION CLINIC  30 tablet  3    Allergies  Itraconazole  Electrocardiogram:  Assessment and Plan

## 2012-03-06 NOTE — Patient Instructions (Signed)
Your physician wants you to follow-up in:  6 MONTHS WITH DR NISHAN  You will receive a reminder letter in the mail two months in advance. If you don't receive a letter, please call our office to schedule the follow-up appointment. Your physician recommends that you continue on your current medications as directed. Please refer to the Current Medication list given to you today. 

## 2012-03-06 NOTE — Assessment & Plan Note (Signed)
Continue Rhythmol  Interogate pacer in future to see afib burden but last time less than 1%

## 2012-03-06 NOTE — Assessment & Plan Note (Signed)
Cholesterol is at goal.  Continue current dose of statin and diet Rx.  No myalgias or side effects.  F/U  LFT's in 6 months. Lab Results  Component Value Date   LDLCALC 104* 10/04/2010

## 2012-03-06 NOTE — Assessment & Plan Note (Signed)
Well controlled.  Continue current medications and low sodium Dash type diet.    

## 2012-03-06 NOTE — Assessment & Plan Note (Signed)
Normal pacer function  F/U with Dr Caryl Comes

## 2012-04-03 ENCOUNTER — Ambulatory Visit (INDEPENDENT_AMBULATORY_CARE_PROVIDER_SITE_OTHER): Payer: Medicare Other | Admitting: *Deleted

## 2012-04-03 DIAGNOSIS — I82409 Acute embolism and thrombosis of unspecified deep veins of unspecified lower extremity: Secondary | ICD-10-CM | POA: Diagnosis not present

## 2012-04-03 DIAGNOSIS — I4891 Unspecified atrial fibrillation: Secondary | ICD-10-CM

## 2012-04-11 DIAGNOSIS — H2510 Age-related nuclear cataract, unspecified eye: Secondary | ICD-10-CM | POA: Diagnosis not present

## 2012-04-11 DIAGNOSIS — IMO0002 Reserved for concepts with insufficient information to code with codable children: Secondary | ICD-10-CM | POA: Diagnosis not present

## 2012-04-16 ENCOUNTER — Ambulatory Visit (INDEPENDENT_AMBULATORY_CARE_PROVIDER_SITE_OTHER): Payer: Medicare Other | Admitting: *Deleted

## 2012-04-16 DIAGNOSIS — I82409 Acute embolism and thrombosis of unspecified deep veins of unspecified lower extremity: Secondary | ICD-10-CM | POA: Diagnosis not present

## 2012-04-16 DIAGNOSIS — I4891 Unspecified atrial fibrillation: Secondary | ICD-10-CM

## 2012-05-07 ENCOUNTER — Ambulatory Visit (INDEPENDENT_AMBULATORY_CARE_PROVIDER_SITE_OTHER): Payer: Medicare Other | Admitting: *Deleted

## 2012-05-07 DIAGNOSIS — I4891 Unspecified atrial fibrillation: Secondary | ICD-10-CM | POA: Diagnosis not present

## 2012-05-07 DIAGNOSIS — I82409 Acute embolism and thrombosis of unspecified deep veins of unspecified lower extremity: Secondary | ICD-10-CM | POA: Diagnosis not present

## 2012-05-07 LAB — POCT INR: INR: 2.2

## 2012-06-04 ENCOUNTER — Ambulatory Visit (INDEPENDENT_AMBULATORY_CARE_PROVIDER_SITE_OTHER): Payer: Medicare Other | Admitting: Pharmacist

## 2012-06-04 DIAGNOSIS — I82409 Acute embolism and thrombosis of unspecified deep veins of unspecified lower extremity: Secondary | ICD-10-CM | POA: Diagnosis not present

## 2012-06-04 DIAGNOSIS — I4891 Unspecified atrial fibrillation: Secondary | ICD-10-CM

## 2012-06-06 DIAGNOSIS — Z09 Encounter for follow-up examination after completed treatment for conditions other than malignant neoplasm: Secondary | ICD-10-CM | POA: Diagnosis not present

## 2012-06-06 DIAGNOSIS — R920 Mammographic microcalcification found on diagnostic imaging of breast: Secondary | ICD-10-CM | POA: Diagnosis not present

## 2012-06-18 DIAGNOSIS — E039 Hypothyroidism, unspecified: Secondary | ICD-10-CM | POA: Diagnosis not present

## 2012-06-18 DIAGNOSIS — L989 Disorder of the skin and subcutaneous tissue, unspecified: Secondary | ICD-10-CM | POA: Diagnosis not present

## 2012-06-18 DIAGNOSIS — R635 Abnormal weight gain: Secondary | ICD-10-CM | POA: Diagnosis not present

## 2012-06-27 DIAGNOSIS — L219 Seborrheic dermatitis, unspecified: Secondary | ICD-10-CM | POA: Diagnosis not present

## 2012-06-27 DIAGNOSIS — L821 Other seborrheic keratosis: Secondary | ICD-10-CM | POA: Diagnosis not present

## 2012-06-27 DIAGNOSIS — D485 Neoplasm of uncertain behavior of skin: Secondary | ICD-10-CM | POA: Diagnosis not present

## 2012-06-27 DIAGNOSIS — L719 Rosacea, unspecified: Secondary | ICD-10-CM | POA: Diagnosis not present

## 2012-06-27 DIAGNOSIS — L851 Acquired keratosis [keratoderma] palmaris et plantaris: Secondary | ICD-10-CM | POA: Diagnosis not present

## 2012-07-02 ENCOUNTER — Ambulatory Visit (INDEPENDENT_AMBULATORY_CARE_PROVIDER_SITE_OTHER): Payer: Medicare Other | Admitting: Pharmacist

## 2012-07-02 DIAGNOSIS — I4891 Unspecified atrial fibrillation: Secondary | ICD-10-CM

## 2012-07-02 DIAGNOSIS — I82409 Acute embolism and thrombosis of unspecified deep veins of unspecified lower extremity: Secondary | ICD-10-CM | POA: Diagnosis not present

## 2012-07-02 MED ORDER — WARFARIN SODIUM 5 MG PO TABS: ORAL_TABLET | ORAL | Status: DC

## 2012-07-11 DIAGNOSIS — H5710 Ocular pain, unspecified eye: Secondary | ICD-10-CM | POA: Diagnosis not present

## 2012-07-18 DIAGNOSIS — IMO0002 Reserved for concepts with insufficient information to code with codable children: Secondary | ICD-10-CM | POA: Diagnosis not present

## 2012-07-18 DIAGNOSIS — H2510 Age-related nuclear cataract, unspecified eye: Secondary | ICD-10-CM | POA: Diagnosis not present

## 2012-07-30 ENCOUNTER — Ambulatory Visit (INDEPENDENT_AMBULATORY_CARE_PROVIDER_SITE_OTHER): Payer: Medicare Other | Admitting: Pharmacist

## 2012-07-30 DIAGNOSIS — I4891 Unspecified atrial fibrillation: Secondary | ICD-10-CM | POA: Diagnosis not present

## 2012-07-30 DIAGNOSIS — I82409 Acute embolism and thrombosis of unspecified deep veins of unspecified lower extremity: Secondary | ICD-10-CM

## 2012-07-30 LAB — POCT INR: INR: 1.6

## 2012-08-13 ENCOUNTER — Ambulatory Visit (INDEPENDENT_AMBULATORY_CARE_PROVIDER_SITE_OTHER): Payer: Medicare Other | Admitting: Family Medicine

## 2012-08-13 VITALS — BP 119/72 | HR 72 | Temp 98.1°F | Resp 16 | Ht 65.0 in | Wt 160.0 lb

## 2012-08-13 DIAGNOSIS — L03039 Cellulitis of unspecified toe: Secondary | ICD-10-CM

## 2012-08-13 MED ORDER — CEPHALEXIN 500 MG PO CAPS: 500.0000 mg | ORAL_CAPSULE | Freq: Four times a day (QID) | ORAL | Status: DC

## 2012-08-13 NOTE — Progress Notes (Signed)
78 yo with right great toe pain for 3 days.  Now the toe is red and she has diffuse aches and malaise.  The nail has been infected with fungus for a long time.  Objective:  NAD Toe is swollen, red around the cuticle with some redness at MTP.  Assessment:  Infected toe  Plan

## 2012-08-19 DIAGNOSIS — M109 Gout, unspecified: Secondary | ICD-10-CM | POA: Insufficient documentation

## 2012-08-19 DIAGNOSIS — B351 Tinea unguium: Secondary | ICD-10-CM | POA: Diagnosis not present

## 2012-08-19 DIAGNOSIS — M779 Enthesopathy, unspecified: Secondary | ICD-10-CM | POA: Diagnosis not present

## 2012-08-20 ENCOUNTER — Ambulatory Visit (INDEPENDENT_AMBULATORY_CARE_PROVIDER_SITE_OTHER): Payer: Medicare Other | Admitting: Pharmacist

## 2012-08-20 DIAGNOSIS — I4891 Unspecified atrial fibrillation: Secondary | ICD-10-CM | POA: Diagnosis not present

## 2012-08-20 DIAGNOSIS — I82409 Acute embolism and thrombosis of unspecified deep veins of unspecified lower extremity: Secondary | ICD-10-CM | POA: Diagnosis not present

## 2012-08-20 LAB — POCT INR: INR: 2.1

## 2012-08-26 DIAGNOSIS — M109 Gout, unspecified: Secondary | ICD-10-CM | POA: Diagnosis not present

## 2012-08-26 DIAGNOSIS — L600 Ingrowing nail: Secondary | ICD-10-CM | POA: Diagnosis not present

## 2012-08-26 DIAGNOSIS — M779 Enthesopathy, unspecified: Secondary | ICD-10-CM | POA: Diagnosis not present

## 2012-09-01 ENCOUNTER — Other Ambulatory Visit: Payer: Self-pay | Admitting: Cardiovascular Disease

## 2012-09-04 ENCOUNTER — Ambulatory Visit (INDEPENDENT_AMBULATORY_CARE_PROVIDER_SITE_OTHER): Payer: Medicare Other | Admitting: Pharmacist

## 2012-09-04 ENCOUNTER — Ambulatory Visit: Payer: Medicare Other | Admitting: Cardiovascular Disease

## 2012-09-04 DIAGNOSIS — I82409 Acute embolism and thrombosis of unspecified deep veins of unspecified lower extremity: Secondary | ICD-10-CM | POA: Diagnosis not present

## 2012-09-04 DIAGNOSIS — I4891 Unspecified atrial fibrillation: Secondary | ICD-10-CM

## 2012-09-04 LAB — POCT INR: INR: 2.1

## 2012-09-17 ENCOUNTER — Encounter: Payer: Self-pay | Admitting: Family Medicine

## 2012-09-17 DIAGNOSIS — M109 Gout, unspecified: Secondary | ICD-10-CM

## 2012-09-19 ENCOUNTER — Encounter: Payer: Self-pay | Admitting: Cardiovascular Disease

## 2012-09-19 ENCOUNTER — Encounter: Payer: Self-pay | Admitting: *Deleted

## 2012-09-19 ENCOUNTER — Ambulatory Visit (INDEPENDENT_AMBULATORY_CARE_PROVIDER_SITE_OTHER): Payer: Medicare Other | Admitting: Cardiovascular Disease

## 2012-09-19 VITALS — BP 160/80 | HR 70 | Wt 163.0 lb

## 2012-09-19 DIAGNOSIS — Z950 Presence of cardiac pacemaker: Secondary | ICD-10-CM

## 2012-09-19 DIAGNOSIS — I10 Essential (primary) hypertension: Secondary | ICD-10-CM

## 2012-09-19 DIAGNOSIS — E782 Mixed hyperlipidemia: Secondary | ICD-10-CM

## 2012-09-19 DIAGNOSIS — I4891 Unspecified atrial fibrillation: Secondary | ICD-10-CM | POA: Diagnosis not present

## 2012-09-19 NOTE — Patient Instructions (Signed)
Your physician recommends that you schedule a follow-up appointment in: [place] Your physician recommends that you continue on your current medications as directed. Please refer to the Current Medication list given to you today.  Your physician has requested that you regularly monitor and record your blood pressure readings at home. Please use the same machine at the same time of day to check your readings and record them to bring to your follow-up visit.  B/P MACHINE BRAND OMRON

## 2012-09-19 NOTE — Assessment & Plan Note (Signed)
Cholesterol is at goal.  Continue current dose of statin and diet Rx.  No myalgias or side effects.  F/U  LFT's in 6 months. Lab Results  Component Value Date   LDLCALC 104* 10/04/2010

## 2012-09-19 NOTE — Assessment & Plan Note (Signed)
Previously normal function  EPS to see next week

## 2012-09-19 NOTE — Progress Notes (Signed)
Patient ID: [name] [name], female   DOB: 11-25-1932, 78 y.o.   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 She has had Very infrequent episodes of palpitations; notably these did not correlate with arrhythmias identified by her pacemaker . I reveiwed her PaceArt interogation from 4/13  Battery Voltage is fine. In afib less than 1 % of time and lead impedences are ok. Overall she's doing pretty well apart fro life stressers. She is widowed  Taking iodine for brittle nails  Thyroid recently checked by [facility] and ok  ROS: Denies fever, malais, weight loss, blurry vision, decreased visual acuity, cough, sputum, SOB, hemoptysis, pleuritic pain, palpitaitons, heartburn, abdominal pain, melena, lower extremity edema, claudication, or rash.  All other systems reviewed and negative  General: Affect appropriate Healthy:  appears stated age 78: normal Neck supple with no adenopathy JVP normal no bruits no thyromegaly Lungs clear with no wheezing and good diaphragmatic motion Heart:  S1/S2 no murmur, no rub, gallop or click PMI normal Abdomen: benighn, BS positve, no tenderness, no AAA no bruit.  No HSM or HJR Distal pulses intact with no bruits No edema Neuro non-focal Skin warm and dry No muscular weakness Pacer under left clavicle   Current Outpatient Prescriptions  Medication Sig Dispense Refill  . BIOTIN FORTE PO Take by mouth as needed.        . Cholecalciferol (VITAMIN D3) 2000 UNITS TABS Take by mouth daily.        . colchicine (COLCRYS) 0.6 MG tablet Take 0.6 mg by mouth as needed.        . Cyanocobalamin (VITAMIN B-12 PO) Take by mouth daily.        Marland Kitchen diltiazem (CARDIZEM) 30 MG tablet Take 1-2 tablets by mouth every 6 hours as needed for palpitations  60 tablet  6  . folic acid (FOLVITE) 1 MG tablet Take 1 mg by mouth daily.        Marland Kitchen LORazepam  (ATIVAN) 0.5 MG tablet Take 0.5 mg by mouth as needed.        . propafenone (RYTHMOL) 225 MG tablet Take 1 tablet (225 mg total) by mouth 2 (two) times daily.  180 tablet  3  . Risedronate Sodium (ATELVIA) 35 MG TBEC Take by mouth once a week.        . warfarin (COUMADIN) 5 MG tablet Take as directed by Anticoagulation clinic  90 tablet  1  . DISCONTD: propafenone (RYTHMOL) 225 MG tablet TAKE ONE TABLET BY MOUTH TWICE DAILY  60 tablet  0    Allergies  Itraconazole  Electrocardiogram: 03/06/12  SR rate 68 RBBB    Assessment and Plan

## 2012-09-19 NOTE — Assessment & Plan Note (Signed)
No palpitations  Seeing SK next week and pacer can be checked for % Afib but likely low.  Continue rhythmol

## 2012-09-19 NOTE — Assessment & Plan Note (Signed)
Not on meds Will get BP cuff and monitor at home  See in 5-6 weeks and start on meds if needed.

## 2012-09-26 ENCOUNTER — Ambulatory Visit (INDEPENDENT_AMBULATORY_CARE_PROVIDER_SITE_OTHER): Payer: Medicare Other | Admitting: Internal Medicine

## 2012-09-26 VITALS — BP 144/80 | HR 64 | Wt 165.0 lb

## 2012-09-26 DIAGNOSIS — Z950 Presence of cardiac pacemaker: Secondary | ICD-10-CM

## 2012-09-26 DIAGNOSIS — I4891 Unspecified atrial fibrillation: Secondary | ICD-10-CM | POA: Diagnosis not present

## 2012-09-26 DIAGNOSIS — I10 Essential (primary) hypertension: Secondary | ICD-10-CM | POA: Diagnosis not present

## 2012-09-26 DIAGNOSIS — L659 Nonscarring hair loss, unspecified: Secondary | ICD-10-CM | POA: Diagnosis not present

## 2012-09-26 LAB — PACEMAKER DEVICE OBSERVATION
AL AMPLITUDE: 2.3 mv
AL THRESHOLD: 1 V
BAMS-0001: 170 {beats}/min
RV LEAD AMPLITUDE: 4.4 mv
RV LEAD IMPEDENCE PM: 416 Ohm
RV LEAD THRESHOLD: 1 V
VENTRICULAR PACING PM: 0.01

## 2012-09-26 NOTE — Assessment & Plan Note (Signed)
I've encouraged her to followup with Dr. Felipa Eth. There is accompanying weight loss and brittle nails and I wonder whether she is hypothyroid

## 2012-09-26 NOTE — Assessment & Plan Note (Signed)
Blood  pressures are better. She will continue to follow him along and see Dr. Mariana Single in a couple weeks

## 2012-09-26 NOTE — Assessment & Plan Note (Signed)
The patient's device was interrogated.  The information was reviewed. No changes were made in the programming.    

## 2012-09-26 NOTE — Assessment & Plan Note (Signed)
She continues to have paroxysms of atrial fibrillation on Rythmol. They're largely asymptomatic. She is also on warfarin. We will continue this.

## 2012-09-26 NOTE — Progress Notes (Signed)
  HPI  [name] [name] is a 78 y.o. 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   .   She is on coumadin.    She has noted recently hair thinning weight gain And fragile nails. She's anticipating seeing her PCP.  When she saw Dr. Johnsie Cancel a few weeks ago her blood pressure is elevated today. She brings in record shows that they are in the 140-165 range     Past Medical History  Diagnosis Date  . Pacemaker     Medtronic  . Sinoatrial node dysfunction   . Mitral valve insufficiency and aortic valve insufficiency   . Edema   . Atrial fibrillation   . Unspecified essential hypertension   . Valvular heart disease   . Depression   . CVA     Lacunar and cerebellar infarct  . DVT (deep venous thrombosis)     Details not available in the chart    No past surgical history on file.  Current Outpatient Prescriptions  Medication Sig Dispense Refill  . BIOTIN FORTE PO Take by mouth as needed.        . Cholecalciferol (VITAMIN D3) 2000 UNITS TABS Take by mouth daily.        . colchicine (COLCRYS) 0.6 MG tablet Take 0.6 mg by mouth as needed.        . Cyanocobalamin (VITAMIN B-12 PO) Take by mouth daily.        Marland Kitchen diltiazem (CARDIZEM) 30 MG tablet Take 1-2 tablets by mouth every 6 hours as needed for palpitations  60 tablet  6  . folic acid (FOLVITE) 1 MG tablet Take 1 mg by mouth daily.        Marland Kitchen LORazepam (ATIVAN) 0.5 MG tablet Take 0.5 mg by mouth as needed.        . propafenone (RYTHMOL) 225 MG tablet Take 1 tablet (225 mg total) by mouth 2 (two) times daily.  180 tablet  3  . Risedronate Sodium (ATELVIA) 35 MG TBEC Take by mouth once a week.        . warfarin (COUMADIN) 5 MG tablet Take as directed by Anticoagulation clinic  90 tablet  1    Allergies  Allergen Reactions  . Itraconazole Diarrhea and Itching    Review of Systems negative except from HPI and  PMH  Physical Exam BP 144/80  Pulse 64  Wt 165 lb (74.844 kg) Well developed and well nourished in no acute distress HENT normal E scleral and icterus clear Neck Supple JVP flat; carotids brisk and full Clear to ausculation Regular rate and rhythm, no murmurs gallops or rub Soft with active bowel sounds No clubbing cyanosis and edema Alert and oriented, grossly normal motor and sensory function Skin Warm and Dry   Assessment and  Plan

## 2012-09-30 ENCOUNTER — Encounter: Payer: Self-pay | Admitting: Internal Medicine

## 2012-10-01 ENCOUNTER — Ambulatory Visit (INDEPENDENT_AMBULATORY_CARE_PROVIDER_SITE_OTHER): Payer: Medicare Other

## 2012-10-01 ENCOUNTER — Other Ambulatory Visit: Payer: Self-pay | Admitting: Cardiovascular Disease

## 2012-10-01 ENCOUNTER — Other Ambulatory Visit: Payer: Self-pay | Admitting: Internal Medicine

## 2012-10-01 DIAGNOSIS — I4891 Unspecified atrial fibrillation: Secondary | ICD-10-CM | POA: Diagnosis not present

## 2012-10-01 DIAGNOSIS — I82409 Acute embolism and thrombosis of unspecified deep veins of unspecified lower extremity: Secondary | ICD-10-CM

## 2012-10-02 ENCOUNTER — Telehealth: Payer: Self-pay | Admitting: Cardiovascular Disease

## 2012-10-02 NOTE — Telephone Encounter (Signed)
plz return call to pt [phone] regarding elevated BP  130/94  Hr 65, pt is concerned and feels a bit dizzy.

## 2012-10-03 NOTE — Telephone Encounter (Signed)
LMTCB ./CY 

## 2012-10-09 NOTE — Telephone Encounter (Signed)
ATTEMPTED TO REACH PT ONCE AGAIN  SPOKE WITH PT BRIEFLY STATES FEELS BETTER NOW  JUST FELT BAD THE ONE DAY WILL CONT TO MONITOR HAS F/U ON  10/15/12 .Adonis Housekeeper

## 2012-10-15 ENCOUNTER — Encounter: Payer: Self-pay | Admitting: Cardiovascular Disease

## 2012-10-15 ENCOUNTER — Ambulatory Visit (INDEPENDENT_AMBULATORY_CARE_PROVIDER_SITE_OTHER): Payer: Medicare Other | Admitting: Cardiovascular Disease

## 2012-10-15 VITALS — BP 146/82 | HR 68 | Ht 65.0 in | Wt 163.0 lb

## 2012-10-15 DIAGNOSIS — I4891 Unspecified atrial fibrillation: Secondary | ICD-10-CM | POA: Diagnosis not present

## 2012-10-15 DIAGNOSIS — I10 Essential (primary) hypertension: Secondary | ICD-10-CM | POA: Diagnosis not present

## 2012-10-15 DIAGNOSIS — E782 Mixed hyperlipidemia: Secondary | ICD-10-CM | POA: Diagnosis not present

## 2012-10-15 DIAGNOSIS — I495 Sick sinus syndrome: Secondary | ICD-10-CM | POA: Diagnosis not present

## 2012-10-15 NOTE — Progress Notes (Signed)
Patient ID: AYZA DELICH, female   DOB: July 02, 1932, 78 y.o.   MRN: [id number] Mrs Rugg seen in followup for paroxysmal atrial fibrillation for which she takes Rythmol and bradycardia for which she is status post pacemaker implantation. She has had Very infrequent episodes of palpitations; notably these did not correlate with arrhythmias identified by her pacemaker . I reveiwed her PaceArt interogation from 4/13 Battery Voltage is fine. In afib less than 1 % of time and lead impedences are ok. Overall she's doing pretty well apart fro life stressers. She is widowed Taking iodine for brittle nails Thyroid recently checked by [facility] and ok  Home pacer equipment doesn;t work needs F/U in pacier clinic ? January Reviewed home BP readings and ok  ROS: Denies fever, malais, weight loss, blurry vision, decreased visual acuity, cough, sputum, SOB, hemoptysis, pleuritic pain, palpitaitons, heartburn, abdominal pain, melena, lower extremity edema, claudication, or rash.  All other systems reviewed and negative  General: Affect appropriate Healthy:  appears stated age 89: normal Neck supple with no adenopathy JVP normal no bruits no thyromegaly Lungs clear with no wheezing and good diaphragmatic motion Heart:  S1/S2 no murmur, no rub, gallop or click PMI normal Abdomen: benighn, BS positve, no tenderness, no AAA no bruit.  No HSM or HJR Distal pulses intact with no bruits No edema Neuro non-focal Skin warm and dry No muscular weakness   Current Outpatient Prescriptions  Medication Sig Dispense Refill  . BIOTIN FORTE PO Take by mouth as needed.        . Cholecalciferol (VITAMIN D3) 2000 UNITS TABS Take by mouth daily.        . colchicine (COLCRYS) 0.6 MG tablet Take 0.6 mg by mouth as needed.        . Cyanocobalamin (VITAMIN B-12 PO) Take by mouth daily.        Marland Kitchen diltiazem (CARDIZEM) 30 MG tablet Take 1-2 tablets by mouth every 6 hours as needed for palpitations  60 tablet  6  .  diltiazem (CARDIZEM) 30 MG tablet 1-2 tablets every 6 hours as needed for palpitations.  60 tablet  3  . folic acid (FOLVITE) 1 MG tablet Take 1 mg by mouth daily.        Marland Kitchen LORazepam (ATIVAN) 0.5 MG tablet Take 0.5 mg by mouth as needed.        . propafenone (RYTHMOL) 225 MG tablet Take 1 tablet (225 mg total) by mouth 2 (two) times daily.  180 tablet  3  . propafenone (RYTHMOL) 225 MG tablet TAKE ONE TABLET BY MOUTH TWICE DAILY  60 tablet  0  . Risedronate Sodium (ATELVIA) 35 MG TBEC Take by mouth once a week.        . warfarin (COUMADIN) 5 MG tablet Take as directed by Anticoagulation clinic  90 tablet  1    Allergies  Itraconazole  Electrocardiogram:  Assessment and Plan

## 2012-10-15 NOTE — Patient Instructions (Addendum)
Your physician wants you to follow-up in:  6 MONTHS WITH DR NISHAN  You will receive a reminder letter in the mail two months in advance. If you don't receive a letter, please call our office to schedule the follow-up appointment. Your physician recommends that you continue on your current medications as directed. Please refer to the Current Medication list given to you today. 

## 2012-10-15 NOTE — Assessment & Plan Note (Signed)
Well controlled.  Continue current medications and low sodium Dash type diet.   Reviewed home readings and ok

## 2012-10-15 NOTE — Assessment & Plan Note (Signed)
Cholesterol is at goal.  Continue current dose of statin and diet Rx.  No myalgias or side effects.  F/U  LFT's in 6 months. Lab Results  Component Value Date   LDLCALC 104* 10/04/2010

## 2012-10-15 NOTE — Assessment & Plan Note (Signed)
Pacer with normal function  F/U pacer clinic in January

## 2012-10-15 NOTE — Assessment & Plan Note (Signed)
Maint NSR with rhythmol  Refill called into [facility]

## 2012-11-02 DIAGNOSIS — Z23 Encounter for immunization: Secondary | ICD-10-CM | POA: Diagnosis not present

## 2012-11-06 ENCOUNTER — Telehealth: Payer: Self-pay | Admitting: Cardiovascular Disease

## 2012-11-06 MED ORDER — PROPAFENONE HCL 225 MG PO TABS: 225.0000 mg | ORAL_TABLET | Freq: Two times a day (BID) | ORAL | Status: DC

## 2012-11-06 NOTE — Telephone Encounter (Signed)
Pt called needing a refill on her Rythmol 225 MG, sent to [place], [place].  Hansel Feinstein, [place]

## 2012-11-07 DIAGNOSIS — M779 Enthesopathy, unspecified: Secondary | ICD-10-CM | POA: Diagnosis not present

## 2012-11-14 ENCOUNTER — Ambulatory Visit (INDEPENDENT_AMBULATORY_CARE_PROVIDER_SITE_OTHER): Payer: Medicare Other | Admitting: *Deleted

## 2012-11-14 DIAGNOSIS — I4891 Unspecified atrial fibrillation: Secondary | ICD-10-CM

## 2012-11-14 DIAGNOSIS — I82409 Acute embolism and thrombosis of unspecified deep veins of unspecified lower extremity: Secondary | ICD-10-CM | POA: Diagnosis not present

## 2012-11-28 DIAGNOSIS — N8184 Pelvic muscle wasting: Secondary | ICD-10-CM | POA: Diagnosis not present

## 2012-11-28 DIAGNOSIS — L940 Localized scleroderma [morphea]: Secondary | ICD-10-CM | POA: Diagnosis not present

## 2012-11-28 DIAGNOSIS — R350 Frequency of micturition: Secondary | ICD-10-CM | POA: Diagnosis not present

## 2012-12-05 ENCOUNTER — Ambulatory Visit (INDEPENDENT_AMBULATORY_CARE_PROVIDER_SITE_OTHER): Payer: Medicare Other | Admitting: *Deleted

## 2012-12-05 DIAGNOSIS — I82409 Acute embolism and thrombosis of unspecified deep veins of unspecified lower extremity: Secondary | ICD-10-CM

## 2012-12-05 DIAGNOSIS — I4891 Unspecified atrial fibrillation: Secondary | ICD-10-CM

## 2012-12-23 ENCOUNTER — Ambulatory Visit (INDEPENDENT_AMBULATORY_CARE_PROVIDER_SITE_OTHER): Payer: Medicare Other

## 2012-12-23 DIAGNOSIS — I82409 Acute embolism and thrombosis of unspecified deep veins of unspecified lower extremity: Secondary | ICD-10-CM | POA: Diagnosis not present

## 2012-12-23 DIAGNOSIS — I4891 Unspecified atrial fibrillation: Secondary | ICD-10-CM | POA: Diagnosis not present

## 2012-12-23 LAB — POCT INR: INR: 1.4

## 2013-01-06 ENCOUNTER — Ambulatory Visit (INDEPENDENT_AMBULATORY_CARE_PROVIDER_SITE_OTHER): Payer: Medicare Other | Admitting: *Deleted

## 2013-01-06 DIAGNOSIS — I4891 Unspecified atrial fibrillation: Secondary | ICD-10-CM | POA: Diagnosis not present

## 2013-01-06 DIAGNOSIS — I82409 Acute embolism and thrombosis of unspecified deep veins of unspecified lower extremity: Secondary | ICD-10-CM

## 2013-01-06 LAB — POCT INR: INR: 2.4

## 2013-01-29 ENCOUNTER — Ambulatory Visit (INDEPENDENT_AMBULATORY_CARE_PROVIDER_SITE_OTHER): Payer: Medicare Other | Admitting: *Deleted

## 2013-01-29 DIAGNOSIS — I4891 Unspecified atrial fibrillation: Secondary | ICD-10-CM | POA: Diagnosis not present

## 2013-01-29 DIAGNOSIS — I82409 Acute embolism and thrombosis of unspecified deep veins of unspecified lower extremity: Secondary | ICD-10-CM | POA: Diagnosis not present

## 2013-01-29 LAB — POCT INR: INR: 3.3

## 2013-02-19 ENCOUNTER — Ambulatory Visit (INDEPENDENT_AMBULATORY_CARE_PROVIDER_SITE_OTHER): Payer: Medicare Other | Admitting: *Deleted

## 2013-02-19 DIAGNOSIS — I82409 Acute embolism and thrombosis of unspecified deep veins of unspecified lower extremity: Secondary | ICD-10-CM | POA: Diagnosis not present

## 2013-02-19 DIAGNOSIS — I4891 Unspecified atrial fibrillation: Secondary | ICD-10-CM

## 2013-02-20 DIAGNOSIS — M109 Gout, unspecified: Secondary | ICD-10-CM | POA: Diagnosis not present

## 2013-02-20 DIAGNOSIS — R300 Dysuria: Secondary | ICD-10-CM | POA: Diagnosis not present

## 2013-02-20 DIAGNOSIS — I4891 Unspecified atrial fibrillation: Secondary | ICD-10-CM | POA: Diagnosis not present

## 2013-02-20 DIAGNOSIS — R030 Elevated blood-pressure reading, without diagnosis of hypertension: Secondary | ICD-10-CM | POA: Diagnosis not present

## 2013-03-05 DIAGNOSIS — R920 Mammographic microcalcification found on diagnostic imaging of breast: Secondary | ICD-10-CM | POA: Diagnosis not present

## 2013-03-06 ENCOUNTER — Ambulatory Visit (INDEPENDENT_AMBULATORY_CARE_PROVIDER_SITE_OTHER): Payer: Medicare Other | Admitting: *Deleted

## 2013-03-06 ENCOUNTER — Other Ambulatory Visit: Payer: Self-pay

## 2013-03-06 DIAGNOSIS — I495 Sick sinus syndrome: Secondary | ICD-10-CM | POA: Diagnosis not present

## 2013-03-06 LAB — PACEMAKER DEVICE OBSERVATION
ATRIAL PACING PM: 24.95
BAMS-0001: 170 {beats}/min
BATTERY VOLTAGE: 2.96 V
RV LEAD THRESHOLD: 1 V
VENTRICULAR PACING PM: 0

## 2013-03-06 NOTE — Progress Notes (Signed)
PPM check 

## 2013-03-19 ENCOUNTER — Ambulatory Visit (INDEPENDENT_AMBULATORY_CARE_PROVIDER_SITE_OTHER): Payer: Medicare Other | Admitting: *Deleted

## 2013-03-19 DIAGNOSIS — I82409 Acute embolism and thrombosis of unspecified deep veins of unspecified lower extremity: Secondary | ICD-10-CM | POA: Diagnosis not present

## 2013-03-19 DIAGNOSIS — I4891 Unspecified atrial fibrillation: Secondary | ICD-10-CM

## 2013-03-28 ENCOUNTER — Encounter: Payer: Self-pay | Admitting: Internal Medicine

## 2013-04-04 ENCOUNTER — Other Ambulatory Visit: Payer: Self-pay | Admitting: *Deleted

## 2013-04-04 MED ORDER — WARFARIN SODIUM 5 MG PO TABS: ORAL_TABLET | ORAL | Status: DC

## 2013-04-08 DIAGNOSIS — R030 Elevated blood-pressure reading, without diagnosis of hypertension: Secondary | ICD-10-CM | POA: Diagnosis not present

## 2013-04-08 DIAGNOSIS — I4891 Unspecified atrial fibrillation: Secondary | ICD-10-CM | POA: Diagnosis not present

## 2013-04-15 ENCOUNTER — Ambulatory Visit (INDEPENDENT_AMBULATORY_CARE_PROVIDER_SITE_OTHER): Payer: Medicare Other | Admitting: *Deleted

## 2013-04-15 ENCOUNTER — Ambulatory Visit: Payer: Medicare Other | Admitting: Cardiovascular Disease

## 2013-04-15 DIAGNOSIS — I4891 Unspecified atrial fibrillation: Secondary | ICD-10-CM

## 2013-04-15 DIAGNOSIS — I82409 Acute embolism and thrombosis of unspecified deep veins of unspecified lower extremity: Secondary | ICD-10-CM

## 2013-04-15 LAB — POCT INR: INR: 2.9

## 2013-05-13 ENCOUNTER — Ambulatory Visit (INDEPENDENT_AMBULATORY_CARE_PROVIDER_SITE_OTHER): Payer: Medicare Other | Admitting: Pharmacist

## 2013-05-13 DIAGNOSIS — I4891 Unspecified atrial fibrillation: Secondary | ICD-10-CM | POA: Diagnosis not present

## 2013-05-13 DIAGNOSIS — I82409 Acute embolism and thrombosis of unspecified deep veins of unspecified lower extremity: Secondary | ICD-10-CM | POA: Diagnosis not present

## 2013-05-13 LAB — POCT INR: INR: 2.8

## 2013-05-16 ENCOUNTER — Ambulatory Visit (INDEPENDENT_AMBULATORY_CARE_PROVIDER_SITE_OTHER): Payer: Medicare Other | Admitting: Cardiovascular Disease

## 2013-05-16 ENCOUNTER — Encounter: Payer: Self-pay | Admitting: Cardiovascular Disease

## 2013-05-16 VITALS — BP 138/70 | HR 63 | Ht 65.0 in | Wt 165.0 lb

## 2013-05-16 DIAGNOSIS — I495 Sick sinus syndrome: Secondary | ICD-10-CM

## 2013-05-16 DIAGNOSIS — G4700 Insomnia, unspecified: Secondary | ICD-10-CM | POA: Diagnosis not present

## 2013-05-16 DIAGNOSIS — I4891 Unspecified atrial fibrillation: Secondary | ICD-10-CM

## 2013-05-16 DIAGNOSIS — I10 Essential (primary) hypertension: Secondary | ICD-10-CM | POA: Diagnosis not present

## 2013-05-16 MED ORDER — LORAZEPAM 0.5 MG PO TABS: 0.5000 mg | ORAL_TABLET | Freq: Three times a day (TID) | ORAL | Status: DC

## 2013-05-16 NOTE — Assessment & Plan Note (Signed)
S/P pacer Only using 20?% of time F/U EPS

## 2013-05-16 NOTE — Patient Instructions (Addendum)
Your physician wants you to follow-up in:  6 MONTHS WITH DR NISHAN  You will receive a reminder letter in the mail two months in advance. If you don't receive a letter, please call our office to schedule the follow-up appointment. Your physician recommends that you continue on your current medications as directed. Please refer to the Current Medication list given to you today. 

## 2013-05-16 NOTE — Progress Notes (Signed)
Patient ID: [name] [name], female   DOB: 08/10/1932, 79 y.o.   MRN: [id number] Mrs Curole seen in followup for paroxysmal atrial fibrillation for which she takes Rythmol and bradycardia for which she is status post pacemaker implantation. She has had Very infrequent episodes of palpitations; notably these did not correlate with arrhythmias identified by her pacemaker . I reveiwed her PaceArt interogation from 4/13 Battery Voltage is fine. In afib less than 1 % of time and lead impedences are ok. Overall she's doing pretty well apart fro life stressers. She is widowed Taking iodine for brittle nails Thyroid recently checked by [facility] and ok Home pacer equipment doesn;t work needs F/U in pacier clinic ? January Reviewed home BP readings and ok  ROS: Denies fever, malais, weight loss, blurry vision, decreased visual acuity, cough, sputum, SOB, hemoptysis, pleuritic pain, palpitaitons, heartburn, abdominal pain, melena, lower extremity edema, claudication, or rash.  All other systems reviewed and negative  General: Affect appropriate Healthy:  appears stated age 18: normal Neck supple with no adenopathy JVP normal no bruits no thyromegaly Lungs clear with no wheezing and good diaphragmatic motion Heart:  S1/S2 no murmur, no rub, gallop or click PMI normal Abdomen: benighn, BS positve, no tenderness, no AAA no bruit.  No HSM or HJR Distal pulses intact with no bruits Trace bilarteral edema with venous insuf Neuro non-focal Skin warm and dry No muscular weakness Pacer under left clavicle   Current Outpatient Prescriptions  Medication Sig Dispense Refill  . ALLOPURINOL PO Take by mouth.      Marland Kitchen BIOTIN FORTE PO Take by mouth as needed.        . Cholecalciferol (VITAMIN D3) 2000 UNITS TABS Take by mouth daily.        . colchicine (COLCRYS) 0.6 MG tablet Take 0.6 mg by mouth as needed.        . Cyanocobalamin (VITAMIN B-12 PO) Take by mouth daily.        Marland Kitchen diltiazem (CARDIZEM) 30 MG  tablet Take 1-2 tablets by mouth every 6 hours as needed for palpitations  60 tablet  6  . folic acid (FOLVITE) 1 MG tablet Take 1 mg by mouth daily.        Marland Kitchen LORazepam (ATIVAN) 0.5 MG tablet Take 0.5 mg by mouth as needed.        . propafenone (RYTHMOL) 225 MG tablet Take 1 tablet (225 mg total) by mouth 2 (two) times daily.  180 tablet  3  . Risedronate Sodium (ATELVIA) 35 MG TBEC Take by mouth once a week.        . warfarin (COUMADIN) 5 MG tablet Take as directed by Anticoagulation clinic  90 tablet  1  . LORazepam (ATIVAN) 0.5 MG tablet Take 1 tablet (0.5 mg total) by mouth every 8 (eight) hours. AS NEEDED  HS  30 tablet  2   No current facility-administered medications for this visit.    Allergies  Itraconazole  Electrocardiogram:  10/331/14  SR rate 64 ICRBBB   Assessment and Plan

## 2013-05-16 NOTE — Assessment & Plan Note (Signed)
Well controlled.  Continue current medications and low sodium Dash type diet.    

## 2013-05-16 NOTE — Assessment & Plan Note (Signed)
Maint NSR continue coumadin no bleeding issues

## 2013-06-10 ENCOUNTER — Ambulatory Visit (INDEPENDENT_AMBULATORY_CARE_PROVIDER_SITE_OTHER): Payer: Medicare Other | Admitting: Pharmacist

## 2013-06-10 DIAGNOSIS — I4891 Unspecified atrial fibrillation: Secondary | ICD-10-CM | POA: Diagnosis not present

## 2013-06-10 DIAGNOSIS — I82409 Acute embolism and thrombosis of unspecified deep veins of unspecified lower extremity: Secondary | ICD-10-CM | POA: Diagnosis not present

## 2013-06-10 LAB — POCT INR: INR: 2.9

## 2013-07-22 ENCOUNTER — Ambulatory Visit (INDEPENDENT_AMBULATORY_CARE_PROVIDER_SITE_OTHER): Payer: Medicare Other | Admitting: Pharmacist

## 2013-07-22 DIAGNOSIS — I4891 Unspecified atrial fibrillation: Secondary | ICD-10-CM

## 2013-07-22 DIAGNOSIS — I82409 Acute embolism and thrombosis of unspecified deep veins of unspecified lower extremity: Secondary | ICD-10-CM

## 2013-08-05 DIAGNOSIS — Z0000 Encounter for general adult medical examination without abnormal findings: Secondary | ICD-10-CM | POA: Diagnosis not present

## 2013-08-05 DIAGNOSIS — M2550 Pain in unspecified joint: Secondary | ICD-10-CM | POA: Diagnosis not present

## 2013-08-05 DIAGNOSIS — Z1331 Encounter for screening for depression: Secondary | ICD-10-CM | POA: Diagnosis not present

## 2013-08-05 DIAGNOSIS — E7800 Pure hypercholesterolemia, unspecified: Secondary | ICD-10-CM | POA: Diagnosis not present

## 2013-08-05 DIAGNOSIS — Z79899 Other long term (current) drug therapy: Secondary | ICD-10-CM | POA: Diagnosis not present

## 2013-08-07 DIAGNOSIS — Z79899 Other long term (current) drug therapy: Secondary | ICD-10-CM | POA: Diagnosis not present

## 2013-08-07 DIAGNOSIS — E7800 Pure hypercholesterolemia, unspecified: Secondary | ICD-10-CM | POA: Diagnosis not present

## 2013-08-19 ENCOUNTER — Ambulatory Visit (INDEPENDENT_AMBULATORY_CARE_PROVIDER_SITE_OTHER): Payer: Medicare Other | Admitting: Pharmacist

## 2013-08-19 DIAGNOSIS — I82409 Acute embolism and thrombosis of unspecified deep veins of unspecified lower extremity: Secondary | ICD-10-CM

## 2013-08-19 DIAGNOSIS — I4891 Unspecified atrial fibrillation: Secondary | ICD-10-CM

## 2013-08-19 LAB — POCT INR: INR: 4.3

## 2013-09-04 ENCOUNTER — Ambulatory Visit (INDEPENDENT_AMBULATORY_CARE_PROVIDER_SITE_OTHER): Payer: Medicare Other | Admitting: *Deleted

## 2013-09-04 DIAGNOSIS — I82409 Acute embolism and thrombosis of unspecified deep veins of unspecified lower extremity: Secondary | ICD-10-CM

## 2013-09-04 DIAGNOSIS — I4891 Unspecified atrial fibrillation: Secondary | ICD-10-CM

## 2013-09-17 DIAGNOSIS — Z961 Presence of intraocular lens: Secondary | ICD-10-CM | POA: Diagnosis not present

## 2013-09-23 ENCOUNTER — Ambulatory Visit (INDEPENDENT_AMBULATORY_CARE_PROVIDER_SITE_OTHER): Payer: Medicare Other | Admitting: *Deleted

## 2013-09-23 DIAGNOSIS — I82409 Acute embolism and thrombosis of unspecified deep veins of unspecified lower extremity: Secondary | ICD-10-CM | POA: Diagnosis not present

## 2013-09-23 DIAGNOSIS — I4891 Unspecified atrial fibrillation: Secondary | ICD-10-CM

## 2013-10-03 DIAGNOSIS — H0019 Chalazion unspecified eye, unspecified eyelid: Secondary | ICD-10-CM | POA: Diagnosis not present

## 2013-10-07 ENCOUNTER — Ambulatory Visit (INDEPENDENT_AMBULATORY_CARE_PROVIDER_SITE_OTHER): Payer: Medicare Other | Admitting: *Deleted

## 2013-10-07 DIAGNOSIS — I4891 Unspecified atrial fibrillation: Secondary | ICD-10-CM | POA: Diagnosis not present

## 2013-10-07 DIAGNOSIS — I82409 Acute embolism and thrombosis of unspecified deep veins of unspecified lower extremity: Secondary | ICD-10-CM | POA: Diagnosis not present

## 2013-10-21 ENCOUNTER — Other Ambulatory Visit: Payer: Self-pay | Admitting: Internal Medicine

## 2013-10-21 ENCOUNTER — Encounter: Payer: Medicare Other | Admitting: Internal Medicine

## 2013-10-21 ENCOUNTER — Encounter: Payer: Self-pay | Admitting: Internal Medicine

## 2013-10-21 ENCOUNTER — Ambulatory Visit (INDEPENDENT_AMBULATORY_CARE_PROVIDER_SITE_OTHER): Payer: Medicare Other | Admitting: Internal Medicine

## 2013-10-21 VITALS — BP 136/76 | HR 61 | Ht 65.0 in | Wt 160.8 lb

## 2013-10-21 DIAGNOSIS — R609 Edema, unspecified: Secondary | ICD-10-CM | POA: Insufficient documentation

## 2013-10-21 DIAGNOSIS — Z950 Presence of cardiac pacemaker: Secondary | ICD-10-CM

## 2013-10-21 DIAGNOSIS — I4891 Unspecified atrial fibrillation: Secondary | ICD-10-CM

## 2013-10-21 LAB — MDC_IDC_ENUM_SESS_TYPE_INCLINIC
Battery Voltage: 2.95 V
Lead Channel Pacing Threshold Amplitude: 1 V
Lead Channel Sensing Intrinsic Amplitude: 2.2697
Lead Channel Sensing Intrinsic Amplitude: 4.4013
Lead Channel Setting Pacing Pulse Width: 0.4 ms
Lead Channel Setting Sensing Sensitivity: 0.9 mV
Zone Setting Detection Interval: 350 ms

## 2013-10-21 MED ORDER — CHLORTHALIDONE 25 MG PO TABS: 25.0000 mg | ORAL_TABLET | Freq: Every day | ORAL | Status: DC | PRN

## 2013-10-21 NOTE — Assessment & Plan Note (Signed)
We'll begin low-dose chlorthalidone to use as needed; we have discussed the importance of salt as relates to edema

## 2013-10-21 NOTE — Assessment & Plan Note (Signed)
Infrequent   Continue current meds

## 2013-10-21 NOTE — Assessment & Plan Note (Signed)
The patient's device was interrogated.  The information was reviewed. No changes were made in the programming.    

## 2013-10-21 NOTE — Progress Notes (Signed)
  HPI  [name] [name] is a 79 y.o. 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   .   She is on coumadin.    Is noted some peripheral edema at the end of the day. There is no notable change in her diet. She is using her Cardizem only infrequently; has been associated with edema in the past.  She asked the question is how DVT in her chart.        Past Medical History  Diagnosis Date  . Pacemaker     Medtronic  . Sinoatrial node dysfunction   . Mitral valve insufficiency and aortic valve insufficiency   . Edema   . Atrial fibrillation   . Unspecified essential hypertension   . Valvular heart disease   . Depression   . CVA     Lacunar and cerebellar infarct  . DVT (deep venous thrombosis)     Details not available in the chart    No past surgical history on file.  Current Outpatient Prescriptions  Medication Sig Dispense Refill  . ALLOPURINOL PO Take by mouth.      Marland Kitchen BIOTIN FORTE PO Take by mouth as needed.        . Cholecalciferol (VITAMIN D3) 2000 UNITS TABS Take by mouth daily.        . colchicine (COLCRYS) 0.6 MG tablet Take 0.6 mg by mouth as needed.        . Cyanocobalamin (VITAMIN B-12 PO) Take by mouth daily.        Marland Kitchen diltiazem (CARDIZEM) 30 MG tablet Take 1-2 tablets by mouth every 6 hours as needed for palpitations  60 tablet  6  . folic acid (FOLVITE) 1 MG tablet Take 1 mg by mouth daily.        Marland Kitchen LORazepam (ATIVAN) 0.5 MG tablet Take 0.5 mg by mouth as needed.        Marland Kitchen LORazepam (ATIVAN) 0.5 MG tablet Take 1 tablet (0.5 mg total) by mouth every 8 (eight) hours. AS NEEDED  HS  30 tablet  2  . propafenone (RYTHMOL) 225 MG tablet Take 1 tablet (225 mg total) by mouth 2 (two) times daily.  180 tablet  3  . Risedronate Sodium (ATELVIA) 35 MG TBEC Take by mouth once a week.        . sertraline (ZOLOFT) 50 MG tablet 50 mg daily.      Marland Kitchen warfarin  (COUMADIN) 5 MG tablet Take as directed by Anticoagulation clinic  90 tablet  1   No current facility-administered medications for this visit.    Allergies  Allergen Reactions  . Itraconazole Diarrhea and Itching    Review of Systems negative except from HPI and PMH  Physical Exam BP 136/76  Pulse 61  Ht 5\' 5"  (1.651 m)  Wt 160 lb 12.8 oz (72.938 kg)  BMI 26.76 kg/m2 Well developed and well nourished in no acute distress HENT normal E scleral and icterus clear Neck Supple JVP flat; carotids brisk and full Clear to ausculation Regular rate and rhythm, no murmurs gallops or rub Soft with active bowel sounds No clubbing cyanosis  1+ edema Alert and oriented, grossly normal motor and sensory function Skin Warm and Dry  ECG demonstrates sinus rhythm at 61 Intervals 17/13/44  right bundle branch block Assessment and  Plan

## 2013-10-21 NOTE — Patient Instructions (Addendum)
Your physician has recommended you make the following change in your medication:  1) Start Chlorthalidone 25 mg daily as needed.  Your physician wants you to follow-up in: 6 months with device clinic.  You will receive a reminder letter in the mail two months in advance. If you don't receive a letter, please call our office to schedule the follow-up appointment.  Your physician wants you to follow-up in: one year with Dr. Caryl Comes.  You will receive a reminder letter in the mail two months in advance. If you don't receive a letter, please call our office to schedule the follow-up appointment.

## 2013-10-28 ENCOUNTER — Ambulatory Visit (INDEPENDENT_AMBULATORY_CARE_PROVIDER_SITE_OTHER): Payer: Medicare Other | Admitting: *Deleted

## 2013-10-28 DIAGNOSIS — I82409 Acute embolism and thrombosis of unspecified deep veins of unspecified lower extremity: Secondary | ICD-10-CM | POA: Diagnosis not present

## 2013-10-28 DIAGNOSIS — I4891 Unspecified atrial fibrillation: Secondary | ICD-10-CM | POA: Diagnosis not present

## 2013-11-06 DIAGNOSIS — I4891 Unspecified atrial fibrillation: Secondary | ICD-10-CM | POA: Diagnosis not present

## 2013-11-13 ENCOUNTER — Ambulatory Visit (INDEPENDENT_AMBULATORY_CARE_PROVIDER_SITE_OTHER): Payer: Medicare Other | Admitting: Cardiovascular Disease

## 2013-11-13 ENCOUNTER — Encounter: Payer: Self-pay | Admitting: Cardiovascular Disease

## 2013-11-13 VITALS — BP 126/84 | HR 72 | Ht 66.0 in | Wt 159.4 lb

## 2013-11-13 DIAGNOSIS — I4891 Unspecified atrial fibrillation: Secondary | ICD-10-CM | POA: Diagnosis not present

## 2013-11-13 DIAGNOSIS — I495 Sick sinus syndrome: Secondary | ICD-10-CM | POA: Diagnosis not present

## 2013-11-13 DIAGNOSIS — I10 Essential (primary) hypertension: Secondary | ICD-10-CM

## 2013-11-13 DIAGNOSIS — R609 Edema, unspecified: Secondary | ICD-10-CM

## 2013-11-13 DIAGNOSIS — E782 Mixed hyperlipidemia: Secondary | ICD-10-CM

## 2013-11-13 NOTE — Assessment & Plan Note (Signed)
Cholesterol is at goal.  Continue current dose of statin and diet Rx.  No myalgias or side effects.  F/U  LFT's in 6 months. Lab Results  Component Value Date   LDLCALC 104* 10/04/2010

## 2013-11-13 NOTE — Assessment & Plan Note (Signed)
Continue Rhythmol  Infrequent episodes  PRN cardizem for breakthrough

## 2013-11-13 NOTE — Assessment & Plan Note (Signed)
NOrmal pacer function f/u Dr Caryl Comes  And pacer clinic

## 2013-11-13 NOTE — Assessment & Plan Note (Signed)
Well controlled.  Continue current medications and low sodium Dash type diet.    

## 2013-11-13 NOTE — Assessment & Plan Note (Signed)
Mildl dependant PRN lasix

## 2013-11-13 NOTE — Progress Notes (Signed)
Patient ID: [name] [name], female   DOB: [birth year], 79 y.o.   MRN: [id number] Mrs Mininni seen in followup for paroxysmal atrial fibrillation for which she takes Rythmol and bradycardia for which she is status post pacemaker implantation. She has had Very infrequent episodes of palpitations; notably these did not correlate with arrhythmias identified by her pacemaker . I reveiwed her PaceArt interogation from 4/13 Battery Voltage is fine. In afib less than 1 % of time and lead impedences are ok. Overall she's doing pretty well apart fro life stressers. She is widowed Taking iodine for brittle nails Thyroid recently checked by [facility] and ok  Saw [facility] earlier in month and pacer ok  Reviewed home BP readings and ok  Takes PRN lasix for edema Takes PRN diltiazem for palpitations.   ROS: Denies fever, malais, weight loss, blurry vision, decreased visual acuity, cough, sputum, SOB, hemoptysis, pleuritic pain, palpitaitons, heartburn, abdominal pain, melena, lower extremity edema, claudication, or rash.  All other systems reviewed and negative  General: Affect appropriate Healthy:  appears stated age 16: normal Neck supple with no adenopathy JVP normal no bruits no thyromegaly Lungs clear with no wheezing and good diaphragmatic motion Heart:  S1/S2 no murmur, no rub, gallop or click PMI normal Abdomen: benighn, BS positve, no tenderness, no AAA no bruit.  No HSM or HJR Distal pulses intact with no bruits No edema Neuro non-focal Skin warm and dry No muscular weakness   Current Outpatient Prescriptions  Medication Sig Dispense Refill  . ALLOPURINOL PO Take by mouth.      Marland Kitchen BIOTIN FORTE PO Take by mouth as needed.        . chlorthalidone (HYGROTON) 25 MG tablet Take 1 tablet (25 mg total) by mouth daily as needed.  30 tablet  11  . Cholecalciferol (VITAMIN D3) 2000 UNITS TABS Take by mouth daily.        . colchicine (COLCRYS) 0.6 MG tablet Take 0.6 mg by mouth as needed.        .  Cyanocobalamin (VITAMIN B-12 PO) Take by mouth daily.        Marland Kitchen diltiazem (CARDIZEM) 30 MG tablet Take 1-2 tablets by mouth every 6 hours as needed for palpitations  60 tablet  6  . folic acid (FOLVITE) 1 MG tablet Take 1 mg by mouth daily.        Marland Kitchen LORazepam (ATIVAN) 0.5 MG tablet Take 0.5 mg by mouth as needed.        . propafenone (RYTHMOL) 225 MG tablet Take 1 tablet (225 mg total) by mouth 2 (two) times daily.  180 tablet  3  . Risedronate Sodium (ATELVIA) 35 MG TBEC Take by mouth once a week.        . sertraline (ZOLOFT) 50 MG tablet 50 mg daily.      Marland Kitchen warfarin (COUMADIN) 5 MG tablet Take as directed by Anticoagulation clinic  90 tablet  1   No current facility-administered medications for this visit.    Allergies  Itraconazole  Electrocardiogram:  11/25  SR rate 61 RBBB   Assessment and Plan

## 2013-11-13 NOTE — Patient Instructions (Signed)
Your physician wants you to follow-up in:  6 MONTHS WITH DR NISHAN  You will receive a reminder letter in the mail two months in advance. If you don't receive a letter, please call our office to schedule the follow-up appointment. Your physician recommends that you continue on your current medications as directed. Please refer to the Current Medication list given to you today. 

## 2013-11-25 ENCOUNTER — Ambulatory Visit (INDEPENDENT_AMBULATORY_CARE_PROVIDER_SITE_OTHER): Payer: Medicare Other | Admitting: Pharmacist

## 2013-11-25 DIAGNOSIS — I4891 Unspecified atrial fibrillation: Secondary | ICD-10-CM | POA: Diagnosis not present

## 2013-11-25 DIAGNOSIS — I82409 Acute embolism and thrombosis of unspecified deep veins of unspecified lower extremity: Secondary | ICD-10-CM

## 2013-11-25 LAB — POCT INR: INR: 2.8

## 2013-12-03 ENCOUNTER — Encounter: Payer: Medicare Other | Admitting: Internal Medicine

## 2013-12-16 ENCOUNTER — Telehealth: Payer: Self-pay | Admitting: Cardiovascular Disease

## 2013-12-16 NOTE — Telephone Encounter (Signed)
Pt states she had 3 nose bleeds on Sunday. They did last several minutes. She held pressure, drank V8 juice went to [facility] practice came back and had another obviously while she was sleep because her sweater had some blood on it. But no other episodes since Sunday, appt scheduled for tomorrow for INR check.

## 2013-12-16 NOTE — Telephone Encounter (Signed)
New message      Having nosebleeds.  Pt is on coumadin.  Want to talk to [place] or [facility]

## 2013-12-17 ENCOUNTER — Ambulatory Visit (INDEPENDENT_AMBULATORY_CARE_PROVIDER_SITE_OTHER): Payer: Medicare Other | Admitting: *Deleted

## 2013-12-17 DIAGNOSIS — I82409 Acute embolism and thrombosis of unspecified deep veins of unspecified lower extremity: Secondary | ICD-10-CM

## 2013-12-17 DIAGNOSIS — I4891 Unspecified atrial fibrillation: Secondary | ICD-10-CM

## 2013-12-17 LAB — POCT INR: INR: 3.9

## 2013-12-21 ENCOUNTER — Other Ambulatory Visit: Payer: Self-pay | Admitting: Cardiovascular Disease

## 2013-12-23 ENCOUNTER — Other Ambulatory Visit: Payer: Self-pay | Admitting: Cardiovascular Disease

## 2013-12-31 ENCOUNTER — Ambulatory Visit (INDEPENDENT_AMBULATORY_CARE_PROVIDER_SITE_OTHER): Payer: Medicare Other | Admitting: *Deleted

## 2013-12-31 DIAGNOSIS — I82409 Acute embolism and thrombosis of unspecified deep veins of unspecified lower extremity: Secondary | ICD-10-CM

## 2013-12-31 DIAGNOSIS — I4891 Unspecified atrial fibrillation: Secondary | ICD-10-CM

## 2013-12-31 DIAGNOSIS — Z5181 Encounter for therapeutic drug level monitoring: Secondary | ICD-10-CM | POA: Diagnosis not present

## 2013-12-31 LAB — POCT INR: INR: 3.4

## 2014-02-03 ENCOUNTER — Ambulatory Visit (INDEPENDENT_AMBULATORY_CARE_PROVIDER_SITE_OTHER): Payer: Medicare Other | Admitting: Pharmacist Clinician (PhC)/ Clinical Pharmacy Specialist

## 2014-02-03 DIAGNOSIS — Z5181 Encounter for therapeutic drug level monitoring: Secondary | ICD-10-CM

## 2014-02-03 DIAGNOSIS — M899 Disorder of bone, unspecified: Secondary | ICD-10-CM | POA: Diagnosis not present

## 2014-02-03 DIAGNOSIS — M949 Disorder of cartilage, unspecified: Secondary | ICD-10-CM | POA: Diagnosis not present

## 2014-02-03 DIAGNOSIS — I4891 Unspecified atrial fibrillation: Secondary | ICD-10-CM

## 2014-02-03 DIAGNOSIS — R609 Edema, unspecified: Secondary | ICD-10-CM | POA: Diagnosis not present

## 2014-02-03 DIAGNOSIS — I82409 Acute embolism and thrombosis of unspecified deep veins of unspecified lower extremity: Secondary | ICD-10-CM | POA: Diagnosis not present

## 2014-02-03 LAB — POCT INR: INR: 2.2

## 2014-02-24 DIAGNOSIS — B3731 Acute candidiasis of vulva and vagina: Secondary | ICD-10-CM | POA: Diagnosis not present

## 2014-02-24 DIAGNOSIS — N905 Atrophy of vulva: Secondary | ICD-10-CM | POA: Diagnosis not present

## 2014-02-24 DIAGNOSIS — B373 Candidiasis of vulva and vagina: Secondary | ICD-10-CM | POA: Diagnosis not present

## 2014-02-24 DIAGNOSIS — Z0000 Encounter for general adult medical examination without abnormal findings: Secondary | ICD-10-CM | POA: Diagnosis not present

## 2014-02-24 DIAGNOSIS — Z01419 Encounter for gynecological examination (general) (routine) without abnormal findings: Secondary | ICD-10-CM | POA: Diagnosis not present

## 2014-02-24 DIAGNOSIS — Z780 Asymptomatic menopausal state: Secondary | ICD-10-CM | POA: Diagnosis not present

## 2014-03-03 ENCOUNTER — Ambulatory Visit (INDEPENDENT_AMBULATORY_CARE_PROVIDER_SITE_OTHER): Payer: Medicare Other | Admitting: *Deleted

## 2014-03-03 DIAGNOSIS — I4891 Unspecified atrial fibrillation: Secondary | ICD-10-CM

## 2014-03-03 DIAGNOSIS — I82409 Acute embolism and thrombosis of unspecified deep veins of unspecified lower extremity: Secondary | ICD-10-CM | POA: Diagnosis not present

## 2014-03-03 DIAGNOSIS — Z5181 Encounter for therapeutic drug level monitoring: Secondary | ICD-10-CM | POA: Diagnosis not present

## 2014-03-03 LAB — POCT INR: INR: 1.6

## 2014-03-09 DIAGNOSIS — M949 Disorder of cartilage, unspecified: Secondary | ICD-10-CM | POA: Diagnosis not present

## 2014-03-09 DIAGNOSIS — M899 Disorder of bone, unspecified: Secondary | ICD-10-CM | POA: Diagnosis not present

## 2014-03-17 ENCOUNTER — Ambulatory Visit (INDEPENDENT_AMBULATORY_CARE_PROVIDER_SITE_OTHER): Payer: Medicare Other | Admitting: *Deleted

## 2014-03-17 DIAGNOSIS — I82409 Acute embolism and thrombosis of unspecified deep veins of unspecified lower extremity: Secondary | ICD-10-CM | POA: Diagnosis not present

## 2014-03-17 DIAGNOSIS — Z5181 Encounter for therapeutic drug level monitoring: Secondary | ICD-10-CM | POA: Diagnosis not present

## 2014-03-17 DIAGNOSIS — I4891 Unspecified atrial fibrillation: Secondary | ICD-10-CM | POA: Diagnosis not present

## 2014-03-17 LAB — POCT INR: INR: 2.8

## 2014-03-27 DIAGNOSIS — H0019 Chalazion unspecified eye, unspecified eyelid: Secondary | ICD-10-CM | POA: Diagnosis not present

## 2014-04-14 ENCOUNTER — Ambulatory Visit (INDEPENDENT_AMBULATORY_CARE_PROVIDER_SITE_OTHER): Payer: Medicare Other | Admitting: Pharmacist

## 2014-04-14 DIAGNOSIS — I82409 Acute embolism and thrombosis of unspecified deep veins of unspecified lower extremity: Secondary | ICD-10-CM

## 2014-04-14 DIAGNOSIS — Z5181 Encounter for therapeutic drug level monitoring: Secondary | ICD-10-CM

## 2014-04-14 DIAGNOSIS — I4891 Unspecified atrial fibrillation: Secondary | ICD-10-CM

## 2014-04-14 LAB — POCT INR: INR: 2.6

## 2014-04-24 ENCOUNTER — Ambulatory Visit (INDEPENDENT_AMBULATORY_CARE_PROVIDER_SITE_OTHER): Payer: Medicare Other | Admitting: *Deleted

## 2014-04-24 DIAGNOSIS — I495 Sick sinus syndrome: Secondary | ICD-10-CM | POA: Diagnosis not present

## 2014-04-24 LAB — MDC_IDC_ENUM_SESS_TYPE_INCLINIC
Brady Statistic AP VS Percent: 33.74 %
Brady Statistic AS VP Percent: 0.01 %
Brady Statistic AS VS Percent: 66.24 %
Brady Statistic RV Percent Paced: 0.02 %
Date Time Interrogation Session: 20150529153947
Lead Channel Impedance Value: 416 Ohm
Lead Channel Impedance Value: 664 Ohm
Lead Channel Pacing Threshold Amplitude: 0.5 V
Lead Channel Pacing Threshold Pulse Width: 0.4 ms
Lead Channel Pacing Threshold Pulse Width: 0.4 ms
Lead Channel Sensing Intrinsic Amplitude: 5.417
Lead Channel Setting Pacing Pulse Width: 0.4 ms
MDC IDC MSMT BATTERY VOLTAGE: 2.93 V
MDC IDC MSMT LEADCHNL RA PACING THRESHOLD AMPLITUDE: 0.5 V
MDC IDC MSMT LEADCHNL RA SENSING INTR AMPL: 2.2261
MDC IDC SET LEADCHNL RA PACING AMPLITUDE: 2 V
MDC IDC SET LEADCHNL RV PACING AMPLITUDE: 2.5 V
MDC IDC SET LEADCHNL RV SENSING SENSITIVITY: 0.9 mV
MDC IDC SET ZONE DETECTION INTERVAL: 350 ms
MDC IDC STAT BRADY AP VP PERCENT: 0.01 %
MDC IDC STAT BRADY RA PERCENT PACED: 33.75 %
Zone Setting Detection Interval: 400 ms

## 2014-04-24 NOTE — Progress Notes (Signed)
Pacemaker check in clinic. Normal device function. Thresholds, sensing, impedances consistent with previous measurements. Device programmed to maximize longevity. 110 AF episodes (5.5%)---max dur. 23 hours, Max A 311, Max V 141 + Warfarin. 1 NSVT episode x 1 sec @ 210/169---SVT. 12 Fast A&V episodes noted--max dur. 14 mins, Max A 333, Max V 188---AF w/ RVR. +Propafenone/ PRN Cardizem. Device programmed at appropriate safety margins. Histogram distribution appropriate for patient activity level. Device programmed to optimize intrinsic conduction. Batt voltage 2.93V (ERI 2.81V). Patient will follow up with the [facility] in 3 months and with [facility] in 6 months.

## 2014-05-08 ENCOUNTER — Encounter: Payer: Self-pay | Admitting: Internal Medicine

## 2014-05-12 ENCOUNTER — Ambulatory Visit (INDEPENDENT_AMBULATORY_CARE_PROVIDER_SITE_OTHER): Payer: Medicare Other

## 2014-05-12 DIAGNOSIS — Z5181 Encounter for therapeutic drug level monitoring: Secondary | ICD-10-CM | POA: Diagnosis not present

## 2014-05-12 DIAGNOSIS — I82409 Acute embolism and thrombosis of unspecified deep veins of unspecified lower extremity: Secondary | ICD-10-CM

## 2014-05-12 DIAGNOSIS — I4891 Unspecified atrial fibrillation: Secondary | ICD-10-CM | POA: Diagnosis not present

## 2014-05-12 LAB — POCT INR: INR: 2

## 2014-05-28 DIAGNOSIS — E7800 Pure hypercholesterolemia, unspecified: Secondary | ICD-10-CM | POA: Diagnosis not present

## 2014-05-28 DIAGNOSIS — R609 Edema, unspecified: Secondary | ICD-10-CM | POA: Diagnosis not present

## 2014-06-09 ENCOUNTER — Ambulatory Visit (INDEPENDENT_AMBULATORY_CARE_PROVIDER_SITE_OTHER): Payer: Medicare Other | Admitting: *Deleted

## 2014-06-09 DIAGNOSIS — Z5181 Encounter for therapeutic drug level monitoring: Secondary | ICD-10-CM | POA: Diagnosis not present

## 2014-06-09 DIAGNOSIS — I4891 Unspecified atrial fibrillation: Secondary | ICD-10-CM | POA: Diagnosis not present

## 2014-06-09 DIAGNOSIS — I82409 Acute embolism and thrombosis of unspecified deep veins of unspecified lower extremity: Secondary | ICD-10-CM

## 2014-06-09 LAB — POCT INR: INR: 2.1

## 2014-06-29 ENCOUNTER — Ambulatory Visit (INDEPENDENT_AMBULATORY_CARE_PROVIDER_SITE_OTHER): Payer: Medicare Other | Admitting: Emergency Medicine

## 2014-06-29 ENCOUNTER — Emergency Department (HOSPITAL_COMMUNITY): Payer: Medicare Other

## 2014-06-29 ENCOUNTER — Encounter (HOSPITAL_COMMUNITY): Payer: Self-pay | Admitting: Emergency Medicine

## 2014-06-29 ENCOUNTER — Inpatient Hospital Stay (HOSPITAL_COMMUNITY)
Admission: EM | Admit: 2014-06-29 | Discharge: 2014-06-30 | DRG: 310 | Disposition: A | Discharge status: Home / Self Care | Payer: Medicare Other | Attending: Internal Medicine | Admitting: Internal Medicine

## 2014-06-29 VITALS — BP 130/84 | HR 134 | Temp 97.3°F | Resp 16 | Ht 65.5 in | Wt 155.4 lb

## 2014-06-29 DIAGNOSIS — Z87891 Personal history of nicotine dependence: Secondary | ICD-10-CM

## 2014-06-29 DIAGNOSIS — S0990XA Unspecified injury of head, initial encounter: Secondary | ICD-10-CM | POA: Diagnosis not present

## 2014-06-29 DIAGNOSIS — I359 Nonrheumatic aortic valve disorder, unspecified: Secondary | ICD-10-CM | POA: Diagnosis present

## 2014-06-29 DIAGNOSIS — Z8673 Personal history of transient ischemic attack (TIA), and cerebral infarction without residual deficits: Secondary | ICD-10-CM | POA: Diagnosis not present

## 2014-06-29 DIAGNOSIS — F329 Major depressive disorder, single episode, unspecified: Secondary | ICD-10-CM | POA: Diagnosis not present

## 2014-06-29 DIAGNOSIS — Z86718 Personal history of other venous thrombosis and embolism: Secondary | ICD-10-CM

## 2014-06-29 DIAGNOSIS — R296 Repeated falls: Secondary | ICD-10-CM

## 2014-06-29 DIAGNOSIS — F3289 Other specified depressive episodes: Secondary | ICD-10-CM | POA: Diagnosis present

## 2014-06-29 DIAGNOSIS — I059 Rheumatic mitral valve disease, unspecified: Secondary | ICD-10-CM | POA: Diagnosis present

## 2014-06-29 DIAGNOSIS — Z7901 Long term (current) use of anticoagulants: Secondary | ICD-10-CM

## 2014-06-29 DIAGNOSIS — R55 Syncope and collapse: Secondary | ICD-10-CM | POA: Diagnosis not present

## 2014-06-29 DIAGNOSIS — Z950 Presence of cardiac pacemaker: Secondary | ICD-10-CM

## 2014-06-29 DIAGNOSIS — S199XXA Unspecified injury of neck, initial encounter: Secondary | ICD-10-CM | POA: Diagnosis not present

## 2014-06-29 DIAGNOSIS — I480 Paroxysmal atrial fibrillation: Secondary | ICD-10-CM

## 2014-06-29 DIAGNOSIS — I4891 Unspecified atrial fibrillation: Principal | ICD-10-CM | POA: Diagnosis present

## 2014-06-29 DIAGNOSIS — I369 Nonrheumatic tricuspid valve disorder, unspecified: Secondary | ICD-10-CM | POA: Diagnosis not present

## 2014-06-29 DIAGNOSIS — I10 Essential (primary) hypertension: Secondary | ICD-10-CM | POA: Diagnosis not present

## 2014-06-29 DIAGNOSIS — I498 Other specified cardiac arrhythmias: Secondary | ICD-10-CM | POA: Diagnosis not present

## 2014-06-29 DIAGNOSIS — T1490XA Injury, unspecified, initial encounter: Secondary | ICD-10-CM | POA: Diagnosis not present

## 2014-06-29 DIAGNOSIS — I495 Sick sinus syndrome: Secondary | ICD-10-CM

## 2014-06-29 DIAGNOSIS — E782 Mixed hyperlipidemia: Secondary | ICD-10-CM | POA: Diagnosis present

## 2014-06-29 DIAGNOSIS — S0180XA Unspecified open wound of other part of head, initial encounter: Secondary | ICD-10-CM

## 2014-06-29 DIAGNOSIS — S0181XA Laceration without foreign body of other part of head, initial encounter: Secondary | ICD-10-CM

## 2014-06-29 DIAGNOSIS — I471 Supraventricular tachycardia: Secondary | ICD-10-CM

## 2014-06-29 DIAGNOSIS — S0993XA Unspecified injury of face, initial encounter: Secondary | ICD-10-CM | POA: Diagnosis not present

## 2014-06-29 DIAGNOSIS — Z9181 History of falling: Secondary | ICD-10-CM

## 2014-06-29 DIAGNOSIS — R791 Abnormal coagulation profile: Secondary | ICD-10-CM

## 2014-06-29 DIAGNOSIS — D689 Coagulation defect, unspecified: Secondary | ICD-10-CM | POA: Diagnosis not present

## 2014-06-29 HISTORY — DX: Nonrheumatic mitral (valve) insufficiency: I34.0

## 2014-06-29 LAB — URINALYSIS, ROUTINE W REFLEX MICROSCOPIC
Bilirubin Urine: NEGATIVE
Glucose, UA: NEGATIVE mg/dL
HGB URINE DIPSTICK: NEGATIVE
KETONES UR: NEGATIVE mg/dL
NITRITE: NEGATIVE
Protein, ur: NEGATIVE mg/dL
Specific Gravity, Urine: 1.011 (ref 1.005–1.030)
Urobilinogen, UA: 0.2 mg/dL (ref 0.0–1.0)
pH: 5 (ref 5.0–8.0)

## 2014-06-29 LAB — CBC WITH DIFFERENTIAL/PLATELET
BASOS ABS: 0 10*3/uL (ref 0.0–0.1)
Basophils Relative: 0 % (ref 0–1)
EOS ABS: 0.1 10*3/uL (ref 0.0–0.7)
EOS PCT: 1 % (ref 0–5)
HCT: 43.9 % (ref 36.0–46.0)
Hemoglobin: 14.7 g/dL (ref 12.0–15.0)
Lymphocytes Relative: 29 % (ref 12–46)
Lymphs Abs: 2.3 10*3/uL (ref 0.7–4.0)
MCH: 32 pg (ref 26.0–34.0)
MCHC: 33.5 g/dL (ref 30.0–36.0)
MCV: 95.6 fL (ref 78.0–100.0)
Monocytes Absolute: 0.8 10*3/uL (ref 0.1–1.0)
Monocytes Relative: 10 % (ref 3–12)
Neutro Abs: 4.6 10*3/uL (ref 1.7–7.7)
Neutrophils Relative %: 60 % (ref 43–77)
PLATELETS: 141 10*3/uL — AB (ref 150–400)
RBC: 4.59 MIL/uL (ref 3.87–5.11)
RDW: 14.9 % (ref 11.5–15.5)
WBC: 7.7 10*3/uL (ref 4.0–10.5)

## 2014-06-29 LAB — I-STAT CHEM 8, ED
BUN: 20 mg/dL (ref 6–23)
CALCIUM ION: 1.17 mmol/L (ref 1.13–1.30)
Chloride: 109 mEq/L (ref 96–112)
Creatinine, Ser: 1 mg/dL (ref 0.50–1.10)
Glucose, Bld: 96 mg/dL (ref 70–99)
HCT: 47 % — ABNORMAL HIGH (ref 36.0–46.0)
Hemoglobin: 16 g/dL — ABNORMAL HIGH (ref 12.0–15.0)
Potassium: 4 mEq/L (ref 3.7–5.3)
SODIUM: 141 meq/L (ref 137–147)
TCO2: 22 mmol/L (ref 0–100)

## 2014-06-29 LAB — PROTIME-INR
INR: 3.22 — AB (ref 0.00–1.49)
PROTHROMBIN TIME: 32.9 s — AB (ref 11.6–15.2)

## 2014-06-29 LAB — URINE MICROSCOPIC-ADD ON

## 2014-06-29 LAB — TSH: TSH: 6.14 u[IU]/mL — AB (ref 0.350–4.500)

## 2014-06-29 MED ORDER — PROPAFENONE HCL 225 MG PO TABS
225.0000 mg | ORAL_TABLET | Freq: Two times a day (BID) | ORAL | Status: DC
  Administered 2014-06-29 – 2014-06-30 (×2): 225 mg via ORAL
  Filled 2014-06-29 (×3): qty 1

## 2014-06-29 MED ORDER — ONDANSETRON HCL 4 MG/2ML IJ SOLN: 4.0000 mg | Freq: Four times a day (QID) | INTRAMUSCULAR | Status: DC | PRN

## 2014-06-29 MED ORDER — SERTRALINE HCL 50 MG PO TABS
50.0000 mg | ORAL_TABLET | Freq: Every day | ORAL | Status: DC
  Administered 2014-06-30: 50 mg via ORAL
  Filled 2014-06-29: qty 1

## 2014-06-29 MED ORDER — TETANUS-DIPHTH-ACELL PERTUSSIS 5-2.5-18.5 LF-MCG/0.5 IM SUSP
0.5000 mL | Freq: Once | INTRAMUSCULAR | Status: AC
Start: 2014-06-29 — End: 2014-06-29
  Administered 2014-06-29: 0.5 mL via INTRAMUSCULAR
  Filled 2014-06-29: qty 0.5

## 2014-06-29 MED ORDER — LORAZEPAM 0.5 MG PO TABS
0.5000 mg | ORAL_TABLET | ORAL | Status: DC | PRN
  Administered 2014-06-30: 0.5 mg via ORAL
  Filled 2014-06-29: qty 1

## 2014-06-29 MED ORDER — ACETAMINOPHEN 325 MG PO TABS: 650.0000 mg | ORAL_TABLET | ORAL | Status: DC | PRN

## 2014-06-29 NOTE — ED Notes (Signed)
Pt fell yesterday evening and hit head- lac to left forehead. Pt also chipped her left front teeth. Pt states she does not think she lost consciousness. Pt tripped over rug. Pt went to urgent care today to be evaluated- steri strips placed on forehead. MD at urgent care sent her here to have head CT due to pt taking coumadin. Pt alert and oriented. Pt has PPM. BP 128/78, HR 110, 98% on room air.

## 2014-06-29 NOTE — ED Provider Notes (Signed)
  Face-to-face evaluation   History: She was injured in a fall last night. Exact nature of fall is not clear.  Physical exam: Alert, elderly, female. Cardiac monitor on arrival indicated , regular heart rate, lipase, nondisplaced, around 135 beats, sustained. As of 14:30, the heart rate was normal paced rhythm. Heart regular rate and rhythm. No murmur. Lungs clear to auscultation. Left forehead has a Steri-Stripped laceration, horizontal, wound edges are well approximated. There is mild associated ecchymosis, but no crepitation or swelling.  Pacemaker interrogation requested- [id number]  Medical screening examination/treatment/procedure(s) were conducted as a shared visit with non-physician practitioner(s) and myself.  I personally evaluated the patient during the encounter  Richarda Blade, MD 06/29/14 [phone]

## 2014-06-29 NOTE — ED Notes (Signed)
Pt moved to room C25

## 2014-06-29 NOTE — H&P (Signed)
Chief Complaint: Falls  HPI: The patient is a 80 year old female, followed by Dr. Johnsie Cancel. Her cardiac history is significant for paroxysmal atrial fibrillation. She is on antiarrhythmic therapy with Rhythmol. She is on warfarin for anticoagulation. She also has a history of sinus node dysfunction, status post permanent pacemaker insertion.  This is a Medtronic pacemaker and is followed by Dr. Caryl Comes.  She presented to the [facility] today after being evaluated at an urgent care facility. She was seen there for treatment of a laceration she sustained to the left forehead after sustaining a fall at home around 7 PM last night. This is the second fall she has sustained within a three-week period. The first fall occurred a little more than 2 weeks ago as she was getting out of her car. However, she feels that this was more of a mechanical fall as she states that she tripped over a curb on a sidewalk. She denies syncope or near-syncope at that time. No loss of consciousness. However, regarding the fall that she sustained last p.m., she states that she is not 100% sure what happened. She is unsure whether or not it was subsequent to mechanical issues or whether or not it was due to a syncopal episode. This occurred inside of her house. She does not remember loosing her footing or tripping over any furniture, rugs or cords. She denies any warning symptoms prior to the event, including no palpitations, lightheadedness, dizziness, diaphoresis, dyspnea or chest discomfort. She states it happened suddenly without warning. In addition to the head laceration, she also chipped her tooth. She denied any recurrent falls last p.m. She waited until earlier today to seek medical evaluation. After presentation to the urgent care facility, she was sent to the ER for a CT scan, to rule out a head bleed in the setting of chronic anticoagulation therapy. The CT of her head was negative for bleed. In the ER she  was noted to have a run of SVT in the 140s. Subsequently, her PPM was interrogated. Interrogation reveals that she has had frequent episodes of atrial fibrillation with rates as high as 180s. No arrhythmias were detected around the time of her fall last night. She reports full medication compliance.    Past Medical History  Diagnosis Date  . Pacemaker     Medtronic  . Sinoatrial node dysfunction   . Mitral valve insufficiency and aortic valve insufficiency   . Edema   . Atrial fibrillation   . Unspecified essential hypertension   . Valvular heart disease   . Depression   . CVA     Lacunar and cerebellar infarct  . DVT (deep venous thrombosis)     Details not available in the chart    History reviewed. No pertinent past surgical history.  Family History  Problem Relation Age of Onset  . Cancer Mother   . Mitral valve prolapse Mother   . Hypertension Mother   . Diabetes Father   . Coronary artery disease Sister   . Stroke Brother    Social History:  reports that she quit smoking about 29 years ago. She does not have any smokeless tobacco history on file. She reports that she drinks about one ounce of alcohol per week. Her drug history is not on file.  Allergies:  Allergies  Allergen Reactions  . Itraconazole Diarrhea and Itching     (Not in a hospital admission)  Results for orders placed during the hospital encounter of 06/29/14 (from the  past 48 hour(s))  URINALYSIS, ROUTINE W REFLEX MICROSCOPIC     Status: Abnormal   Collection Time    06/29/14  2:21 PM      Result Value Ref Range   Color, Urine YELLOW  YELLOW   APPearance CLEAR  CLEAR   Specific Gravity, Urine 1.011  1.005 - 1.030   pH 5.0  5.0 - 8.0   Glucose, UA NEGATIVE  NEGATIVE mg/dL   Hgb urine dipstick NEGATIVE  NEGATIVE   Bilirubin Urine NEGATIVE  NEGATIVE   Ketones, ur NEGATIVE  NEGATIVE mg/dL   Protein, ur NEGATIVE  NEGATIVE mg/dL   Urobilinogen, UA 0.2  0.0 - 1.0 mg/dL   Nitrite NEGATIVE  NEGATIVE    Leukocytes, UA TRACE (*) NEGATIVE  URINE MICROSCOPIC-ADD ON     Status: None   Collection Time    06/29/14  2:21 PM      Result Value Ref Range   Squamous Epithelial / LPF RARE  RARE   WBC, UA 0-2  <3 WBC/hpf   RBC / HPF 0-2  <3 RBC/hpf   Bacteria, UA RARE  RARE  CBC WITH DIFFERENTIAL     Status: Abnormal   Collection Time    06/29/14  2:33 PM      Result Value Ref Range   WBC 7.7  4.0 - 10.5 K/uL   RBC 4.59  3.87 - 5.11 MIL/uL   Hemoglobin 14.7  12.0 - 15.0 g/dL   HCT 43.9  36.0 - 46.0 %   MCV 95.6  78.0 - 100.0 fL   MCH 32.0  26.0 - 34.0 pg   MCHC 33.5  30.0 - 36.0 g/dL   RDW 14.9  11.5 - 15.5 %   Platelets 141 (*) 150 - 400 K/uL   Neutrophils Relative % 60  43 - 77 %   Neutro Abs 4.6  1.7 - 7.7 K/uL   Lymphocytes Relative 29  12 - 46 %   Lymphs Abs 2.3  0.7 - 4.0 K/uL   Monocytes Relative 10  3 - 12 %   Monocytes Absolute 0.8  0.1 - 1.0 K/uL   Eosinophils Relative 1  0 - 5 %   Eosinophils Absolute 0.1  0.0 - 0.7 K/uL   Basophils Relative 0  0 - 1 %   Basophils Absolute 0.0  0.0 - 0.1 K/uL  PROTIME-INR     Status: Abnormal   Collection Time    06/29/14  2:33 PM      Result Value Ref Range   Prothrombin Time 32.9 (*) 11.6 - 15.2 seconds   INR 3.22 (*) 0.00 - 1.49  I-STAT CHEM 8, ED     Status: Abnormal   Collection Time    06/29/14  2:48 PM      Result Value Ref Range   Sodium 141  137 - 147 mEq/L   Potassium 4.0  3.7 - 5.3 mEq/L   Chloride 109  96 - 112 mEq/L   BUN 20  6 - 23 mg/dL   Creatinine, Ser 1.00  0.50 - 1.10 mg/dL   Glucose, Bld 96  70 - 99 mg/dL   Calcium, Ion 1.17  1.13 - 1.30 mmol/L   TCO2 22  0 - 100 mmol/L   Hemoglobin 16.0 (*) 12.0 - 15.0 g/dL   HCT 47.0 (*) 36.0 - 46.0 %   Ct Head Wo Contrast  06/29/2014   ADDENDUM REPORT: 06/29/2014 15:31  ADDENDUM: Cervical spine CT report should read as follows :  No soft tissue swelling. Minimal anterior subluxation of C4 on C5 is present, most likely degenerative. Ligamentous injury cannot be excluded. No  evidence of acute fracture or dislocation. Biapical pleural parenchymal thickening noted consistent with scarring.  IMPRESSION: Minimal anterior subluxation of C4 on C5, most likely degenerative. Ligamentous injury cannot be excluded. No evidence of fracture or dislocation.   Electronically Signed   By: Marcello Moores  Register   On: 06/29/2014 15:31   06/29/2014   CLINICAL DATA:  Fall.  EXAM: CT HEAD WITHOUT CONTRAST  TECHNIQUE: Contiguous axial images were obtained from the base of the skull through the vertex without intravenous contrast.  COMPARISON:  08/29/2010.  FINDINGS: No mass. No hydrocephalus. No hemorrhage. White matter changes consistent with chronic ischemia. No acute bony abnormality.  IMPRESSION: No acute abnormality. Diffuse cerebral atrophy. White matter changes consistent with chronic ischemia .  Electronically Signed: ByMarcello Moores  Register On: 06/29/2014 15:16   Ct Cervical Spine Wo Contrast  06/29/2014   ADDENDUM REPORT: 06/29/2014 15:31  ADDENDUM: Cervical spine CT report should read as follows :  No soft tissue swelling. Minimal anterior subluxation of C4 on C5 is present, most likely degenerative. Ligamentous injury cannot be excluded. No evidence of acute fracture or dislocation. Biapical pleural parenchymal thickening noted consistent with scarring.  IMPRESSION: Minimal anterior subluxation of C4 on C5, most likely degenerative. Ligamentous injury cannot be excluded. No evidence of fracture or dislocation.   Electronically Signed   By: Marcello Moores  Register   On: 06/29/2014 15:31   06/29/2014   CLINICAL DATA:  Fall.  EXAM: CT HEAD WITHOUT CONTRAST  TECHNIQUE: Contiguous axial images were obtained from the base of the skull through the vertex without intravenous contrast.  COMPARISON:  08/29/2010.  FINDINGS: No mass. No hydrocephalus. No hemorrhage. White matter changes consistent with chronic ischemia. No acute bony abnormality.  IMPRESSION: No acute abnormality. Diffuse cerebral atrophy. White matter  changes consistent with chronic ischemia .  Electronically Signed: By: Marcello Moores  Register On: 06/29/2014 15:16    Review of Systems  Constitutional: Negative for diaphoresis.  Respiratory: Negative for shortness of breath.   Cardiovascular: Negative for chest pain, palpitations and leg swelling.  Musculoskeletal: Positive for falls.  Neurological: Negative for dizziness.    Blood pressure 106/86, pulse 59, temperature 97.6 F (36.4 C), temperature source Oral, resp. rate 16, height 5\' 5"  (1.651 m), weight 155 lb (70.308 kg), SpO2 100.00%. Physical Exam  Constitutional: She is oriented to person, place, and time. She appears well-developed and well-nourished. No distress.  HENT:  Head: Head is with laceration (left anterior forehead).  Neck: No JVD present.  Cardiovascular: Normal rate, regular rhythm, normal heart sounds and intact distal pulses.   Respiratory: Effort normal and breath sounds normal. No respiratory distress. She has no wheezes.  Musculoskeletal: She exhibits no edema.  Neurological: She is alert and oriented to person, place, and time.  Skin: Skin is warm and dry. She is not diaphoretic.  Psychiatric: She has a normal mood and affect. Her behavior is normal.     Assessment/Plan Active Problems:   PAF (paroxysmal atrial fibrillation)   SVT (supraventricular tachycardia)   Frequent falls   Chronic anticoagulation   Pacemaker  1. Falls: Patient has sustained 2 falls within a three-week period. Question etiology: Syncope versus mechanical.   2. Paroxysmal atrial fibrillation/SVT: Pacemaker device interrogation reveals frequent episodes of atrial fibrillation with ventricular rates as high as the 180s. She has been on antiarrhythmic therapy with Rythmol. We will admit to  telemetry for further monitoring. Will check a TSH. We'll ask EP to evaluate in the a.m. to consider AV nodal ablation vs another anti-arrhythmic  3. Chronic oral anticoagulation: On Coumadin for PAF.  INR is supratherapeutic at 3.22 (goal is 2-3). Will hold night dose of Coumadin. Given her history of recent falls resulting in head trauma, question long-term use going forward. Will leave decision to her primary cardiologist Dr. Johnsie Cancel as well as to her electrophysiologist Dr. Caryl Comes.  Lyda Jester 06/29/2014, 6:53 PM   The patient was seen, examined and discussed with Lyda Jester, PA-C and I agree with the above.   80 year old female, followed by Dr. Johnsie Cancel and Dr Caryl Comes, who has a permanent PM secondary to SSS, tachy-brady syndrome, with paroxysmal atrial fibrillation on chronic anticoagulation with coumadine. She has been taking Rythmol and Diltiazem PRN.  Per last note in November 2011 her a-fib episodes were infrequent.  She presented to the ER today for treatment fall the last night where she sustained head laceration with prolonged bleeding. The CT of her head was negative for bleed. This is a second fall in the last 2 weeks, the first one was more of a mechanical fall while getting out of car, however the patient has no recollection of yesterdays fall. She states that she has experienced more palpitations in the last couple of days.  PM interrogation shows at least 8 episodes of a-fib in the last 3 days. Ventricular rate up to 188 BPM on at least 3 episodes. One episode lasted 42 hours with average ventricular rate 133 BPM. There is no correlation between episodes and time of fall, however this is very concerning.  We will consult EP for further a-fib management, considering she has a PM she might be a candidate for AVN ablation.  Dorothy Spark 06/29/2014

## 2014-06-29 NOTE — ED Provider Notes (Signed)
CSN: [id number]     Arrival date & time 06/29/14  1334 History   First MD Initiated Contact with Patient 06/29/14 1405     Chief Complaint  Patient presents with  . Fall     (Consider location/radiation/quality/duration/timing/severity/associated sxs/prior Treatment) HPI  80 year old female sent here from urgent care Center for further evaluation of a recent fall. History obtained through patient who appears to be a reliable historian. Patient states she lives at an assisted facility. Yesterday night she had a fall and suffered a laceration to the left forehead. Patient does not recall exactly what had happened but thought that she may have tripped on her drug, fell hitting her forehead against a door knob and had a brief syncopal episode. She denies any precipitating symptoms prior to fall. She admits to be on Coumadin for history of A. fib. She recall having moderate bleeding from her forehead initially, dressing was placed and patient was seen in the ER for wound check. From the fall she did have a for laceration and chipped her left front teeth. Given that she is on blood thinner medication she was recommended to come here for further management. At this time patient complain of mild headache only, no loss of vision, vision changes, slurring of speech, difficulty thinking, hip pain, chest pain, shortness of breath, new numbness or weakness.  Does not recall last tetanus.    Past Medical History  Diagnosis Date  . Pacemaker     Medtronic  . Sinoatrial node dysfunction   . Mitral valve insufficiency and aortic valve insufficiency   . Edema   . Atrial fibrillation   . Unspecified essential hypertension   . Valvular heart disease   . Depression   . CVA     Lacunar and cerebellar infarct  . DVT (deep venous thrombosis)     Details not available in the chart   History reviewed. No pertinent past surgical history. Family History  Problem Relation Age of Onset  . Cancer Mother   . Mitral  valve prolapse Mother   . Hypertension Mother   . Diabetes Father   . Coronary artery disease Sister   . Stroke Brother    History  Substance Use Topics  . Smoking status: Former Smoker    Quit date: 08/13/1984  . Smokeless tobacco: Not on file  . Alcohol Use: 1.0 oz/week    2 drink(s) per week   OB History   Grav Para Term Preterm Abortions TAB SAB Ect Mult Living                 Review of Systems  All other systems reviewed and are negative.     Allergies  Itraconazole  Home Medications   Prior to Admission medications   Medication Sig Start Date End Date Taking? Authorizing Provider  ALLOPURINOL PO Take by mouth.    Historical Provider, MD  BIOTIN FORTE PO Take by mouth as needed.      Historical Provider, MD  chlorthalidone (HYGROTON) 25 MG tablet Take 1 tablet (25 mg total) by mouth daily as needed. 10/21/13   Deboraha Sprang, MD  Cholecalciferol (VITAMIN D3) 2000 UNITS TABS Take by mouth daily.      Historical Provider, MD  COCONUT OIL PO Take 4 capsules by mouth daily.    Historical Provider, MD  colchicine (COLCRYS) 0.6 MG tablet Take 0.6 mg by mouth as needed.      Historical Provider, MD  Cyanocobalamin (VITAMIN B-12 PO) Take by mouth  daily.      Historical Provider, MD  diltiazem (CARDIZEM) 30 MG tablet Take 1-2 tablets by mouth every 6 hours as needed for palpitations 09/20/11   Deboraha Sprang, MD  estradiol (ESTRACE) 0.1 MG/GM vaginal cream Place 1 Applicatorful vaginally 2 (two) times a week.    Historical Provider, MD  folic acid (FOLVITE) 1 MG tablet Take 1 mg by mouth daily.     Historical Provider, MD  LORazepam (ATIVAN) 0.5 MG tablet Take 0.5 mg by mouth as needed.      Historical Provider, MD  nystatin cream (MYCOSTATIN) Place 1 application vaginally 2 (two) times daily.    Historical Provider, MD  propafenone (RYTHMOL) 225 MG tablet TAKE 1 TABLET BY MOUTH TWICE DAILY 12/21/13   Josue Hector, MD  Risedronate Sodium (ATELVIA) 35 MG TBEC Take by mouth  once a week.      Historical Provider, MD  sertraline (ZOLOFT) 50 MG tablet 50 mg daily. 07/18/13   Historical Provider, MD  warfarin (COUMADIN) 5 MG tablet TAKE AS DIRECTED BY ANTICOAGULATION CLINIC 12/23/13   Josue Hector, MD   BP 118/79  Pulse 140  Temp(Src) 97.6 F (36.4 C) (Oral)  Resp 12  Ht 5\' 5"  (1.651 m)  Wt 155 lb (70.308 kg)  BMI 25.79 kg/m2  SpO2 97% Physical Exam  Nursing note and vitals reviewed. Constitutional: She is oriented to person, place, and time. She appears well-developed and well-nourished. No distress.  HENT:  Head: Atraumatic.  Right Ear: External ear normal.  Left Ear: External ear normal.  Nose: Nose normal.  Mouth/Throat: Oropharynx is clear and moist. No oropharyngeal exudate.  No hemotympanum, no septal hematoma, no midface tenderness, no malocclusion.  Eyes: Conjunctivae and EOM are normal. Pupils are equal, round, and reactive to light.  Neck: Normal range of motion. Neck supple.  Cardiovascular:  Irregularly irregular without murmur rubs or gallops  Pulmonary/Chest: Effort normal and breath sounds normal. She exhibits no tenderness.  Abdominal: Soft.  Musculoskeletal: Normal range of motion. She exhibits no edema.  No significant midline spine tenderness, crepitus, or step-off noted.  Neurological: She is alert and oriented to person, place, and time.  Neurologic exam:  Speech clear, pupils equal round reactive to light, extraocular movements intact  Normal peripheral visual fields Cranial nerves III through XII normal including no facial droop Follows commands, moves all extremities x4, normal strength to bilateral upper and lower extremities at all major muscle groups including grip Sensation normal to light touch  Coordination intact, no limb ataxia, finger-nose-finger normal Rapid alternating movements normal No pronator drift Gait normal   Skin: No rash noted.  Left forehead with 1 cm laceration with Steri-Strip in place, not  actively bleeding, mild tenderness to palpation without crepitus   Psychiatric: She has a normal mood and affect.    ED Course  Procedures (including critical care time)  2:28 PM Patient was sent here from urgent care for further evaluation of a fall last night with head injury and currently on Coumadin. She is mentating appropriately, alert and oriented and appears to be in no acute distress. She has no focal neuro deficit on exam. Will obtain head and neck CT scan, EKG, basic labs, UA, and update tetanus shot  2:42 PM Pt with tachycardia, suspect afib with RVR, not symptomatic at this time.  She did take her CCB today.  She has a Paramedic, will have it interrogate as this may have caused her fall.  3:25 PM Head and Cspine CT without acute fx.  Electrolytes are reassuring, UA without evidence of UTI, EKG is rate controlled.  Pt resting comfortably.  Awaits pacemaker interrogation.  Care discussed with oncoming provider who will dispo pt pending result of pace maker interrogation.    Labs Review Labs Reviewed  CBC WITH DIFFERENTIAL - Abnormal; Notable for the following:    Platelets 141 (*)    All other components within normal limits  PROTIME-INR - Abnormal; Notable for the following:    Prothrombin Time 32.9 (*)    INR 3.22 (*)    All other components within normal limits  URINALYSIS, ROUTINE W REFLEX MICROSCOPIC - Abnormal; Notable for the following:    Leukocytes, UA TRACE (*)    All other components within normal limits  I-STAT CHEM 8, ED - Abnormal; Notable for the following:    Hemoglobin 16.0 (*)    HCT 47.0 (*)    All other components within normal limits  URINE MICROSCOPIC-ADD ON    Imaging Review Ct Head Wo Contrast  06/29/2014   ADDENDUM REPORT: 06/29/2014 15:31  ADDENDUM: Cervical spine CT report should read as follows :  No soft tissue swelling. Minimal anterior subluxation of C4 on C5 is present, most likely degenerative. Ligamentous injury cannot be  excluded. No evidence of acute fracture or dislocation. Biapical pleural parenchymal thickening noted consistent with scarring.  IMPRESSION: Minimal anterior subluxation of C4 on C5, most likely degenerative. Ligamentous injury cannot be excluded. No evidence of fracture or dislocation.   Electronically Signed   By: Marcello Moores  Register   On: 06/29/2014 15:31   06/29/2014   CLINICAL DATA:  Fall.  EXAM: CT HEAD WITHOUT CONTRAST  TECHNIQUE: Contiguous axial images were obtained from the base of the skull through the vertex without intravenous contrast.  COMPARISON:  08/29/2010.  FINDINGS: No mass. No hydrocephalus. No hemorrhage. White matter changes consistent with chronic ischemia. No acute bony abnormality.  IMPRESSION: No acute abnormality. Diffuse cerebral atrophy. White matter changes consistent with chronic ischemia .  Electronically Signed: ByMarcello Moores  Register On: 06/29/2014 15:16   Ct Cervical Spine Wo Contrast  06/29/2014   ADDENDUM REPORT: 06/29/2014 15:31  ADDENDUM: Cervical spine CT report should read as follows :  No soft tissue swelling. Minimal anterior subluxation of C4 on C5 is present, most likely degenerative. Ligamentous injury cannot be excluded. No evidence of acute fracture or dislocation. Biapical pleural parenchymal thickening noted consistent with scarring.  IMPRESSION: Minimal anterior subluxation of C4 on C5, most likely degenerative. Ligamentous injury cannot be excluded. No evidence of fracture or dislocation.   Electronically Signed   By: Marcello Moores  Register   On: 06/29/2014 15:31   06/29/2014   CLINICAL DATA:  Fall.  EXAM: CT HEAD WITHOUT CONTRAST  TECHNIQUE: Contiguous axial images were obtained from the base of the skull through the vertex without intravenous contrast.  COMPARISON:  08/29/2010.  FINDINGS: No mass. No hydrocephalus. No hemorrhage. White matter changes consistent with chronic ischemia. No acute bony abnormality.  IMPRESSION: No acute abnormality. Diffuse cerebral atrophy.  White matter changes consistent with chronic ischemia .  Electronically Signed: ByMarcello Moores  Register On: 06/29/2014 15:16     EKG Interpretation   Date/Time:  Monday June 29 2014 14:44:07 EDT Ventricular Rate:  83 PR Interval:  156 QRS Duration: 131 QT Interval:  458 QTC Calculation: 538 R Axis:   97 Text Interpretation:  A-V dual-paced complexes w/ some inhibition No  further analysis attempted due to paced  rhythm Since last tracing AV  pacing has replaced NSR Confirmed by [facility]  MD, ELLIOTT [phone]) on  06/29/2014 2:51:46 PM      MDM   Final diagnoses:  SVT (supraventricular tachycardia)  Supratherapeutic INR  Syncope, unspecified syncope type   I have reviewed nursing notes and vital signs. I personally reviewed the imaging tests through PACS system  I reviewed available ER/hospitalization records thought the EMR   BP 114/57  Pulse 59  Temp(Src) 97.6 F (36.4 C) (Oral)  Resp 14  Ht 5\' 5"  (1.651 m)  Wt 155 lb (70.308 kg)  BMI 25.79 kg/m2  SpO2 99%     Domenic Moras, PA-C 06/30/14 [phone]

## 2014-06-29 NOTE — ED Notes (Signed)
Received fax from [facility]- gave to [place]

## 2014-06-29 NOTE — ED Notes (Signed)
Pt has medtronic PPM. Charge RN at bedside to interrogate PPM

## 2014-06-29 NOTE — ED Notes (Signed)
[employer] representative [place] called concerning PPM interrogation. Pt's PPM operating correctly. Pt has had several episodes of SVT rates as high at 180's. Kathlyn [place] will fax report to [facility] fax machine.

## 2014-06-29 NOTE — ED Provider Notes (Signed)
3:48 PM Patient signed out to me by Haywood Pao.  Patient sent from [facility] for fall last night.  She does not remember falling, and thinks she could have passed out.  She has afib and a pacemaker.  She is currently taking coumadin and is found to be supratherapeutic today.    Plan:  Interrogate pacemaker.  Hold coumadin for today and follow-up in clinic. Mild C4-C5 subluxation, but patient moving neck with no pain or difficulty.  4:47 PM Patient seen by me, able to range neck through all range of motion without pain, there is no C-spine tenderness. I discussed this with Dr. Eulis Foster, who states that the patient can followup with her primary care provider.  Additionally, patient has had several runs of SVT over the past couple of days, pacemaker is working appropriately, but I will get in touch with her cardiologist, and seek recommendations.  6:53 PM Patient will be admitted by cardiology.  Possible ablation tomorrow.   Montine Circle, PA-C 06/29/14 [place], PA-C 06/30/14 [phone]

## 2014-06-29 NOTE — Progress Notes (Signed)
This chart was scribed for [name] Queen, MD by Thea Alken, ED Scribe. This patient was seen in room 6 and the patient's care was started at 12:29 PM. Subjective:    Patient ID: [name] [name], female    DOB: 08/18/1932, 80 y.o.   MRN: [id number]  Laceration    Chief Complaint  Patient presents with  . Laceration    on forehead---hurts on the side of the head    HPI Comments: AVIANAH STAHR is a 80 y.o. female who presents to the [facility] complaining of a laceration to left forehead x 17 hours. Pt reports she slipped on her rug and hit her head on the door knob while running to get the phone. Pt believer this is the mechanism of injury but is not sure. Pt reports associated nausea without emesis. She also reports she chipped her tooth due to fall. Pt lives in retirement home and lives by herself. It is unclear whether pt loss conscience .Pt reports she fell 2 weeks ago after missing the curb. She denies LOC at that time. Pt take coumadin due to afib. Pt also has a Psychologist, forensic. Pt drove herself to be seen.  Past Medical History  Diagnosis Date  . Pacemaker     Medtronic  . Sinoatrial node dysfunction   . Mitral valve insufficiency and aortic valve insufficiency   . Edema   . Atrial fibrillation   . Unspecified essential hypertension   . Valvular heart disease   . Depression   . CVA     Lacunar and cerebellar infarct  . DVT (deep venous thrombosis)     Details not available in the chart   History reviewed. No pertinent past surgical history. Prior to Admission medications   Medication Sig Start Date End Date Taking? Authorizing Provider  ALLOPURINOL PO Take by mouth.   Yes Historical Provider, MD  BIOTIN FORTE PO Take by mouth as needed.     Yes Historical Provider, MD  chlorthalidone (HYGROTON) 25 MG tablet Take 1 tablet (25 mg total) by mouth daily as needed. 10/21/13  Yes Deboraha Sprang, MD  Cholecalciferol (VITAMIN D3) 2000 UNITS TABS Take by  mouth daily.     Yes Historical Provider, MD  COCONUT OIL PO Take 4 capsules by mouth daily.   Yes Historical Provider, MD  colchicine (COLCRYS) 0.6 MG tablet Take 0.6 mg by mouth as needed.     Yes Historical Provider, MD  Cyanocobalamin (VITAMIN B-12 PO) Take by mouth daily.     Yes Historical Provider, MD  diltiazem (CARDIZEM) 30 MG tablet Take 1-2 tablets by mouth every 6 hours as needed for palpitations 09/20/11  Yes Deboraha Sprang, MD  estradiol (ESTRACE) 0.1 MG/GM vaginal cream Place 1 Applicatorful vaginally 2 (two) times a week.   Yes Historical Provider, MD  folic acid (FOLVITE) 1 MG tablet Take 1 mg by mouth daily.     Yes Historical Provider, MD  LORazepam (ATIVAN) 0.5 MG tablet Take 0.5 mg by mouth as needed.     Yes Historical Provider, MD  propafenone (RYTHMOL) 225 MG tablet TAKE 1 TABLET BY MOUTH TWICE DAILY 12/21/13  Yes Josue Hector, MD  sertraline (ZOLOFT) 50 MG tablet 50 mg daily. 07/18/13  Yes Historical Provider, MD  warfarin (COUMADIN) 5 MG tablet TAKE AS DIRECTED BY ANTICOAGULATION CLINIC 12/23/13  Yes Josue Hector, MD  nystatin cream (MYCOSTATIN) Place 1 application vaginally 2 (two) times daily.    Historical  Provider, MD  Risedronate Sodium (ATELVIA) 35 MG TBEC Take by mouth once a week.      Historical Provider, MD   Review of Systems  Constitutional: Negative for fever and chills.  HENT: Positive for dental problem.   Gastrointestinal: Positive for nausea. Negative for vomiting.  Skin: Positive for wound.  Neurological: Negative for syncope.    Objective:   Physical Exam CONSTITUTIONAL: Well developed/well nourished HEAD: Normocephalic, there are multiple steri strips over a presumed lacertion with oozing of blood. This area is very tender to touch.  EYES: EOMI/PERRL ENMT: Mucous membranes moist NECK: supple no meningeal signs SPINE:entire spine nontender CV: S1/S2 noted, no murmurs/rubs/gallops noted, pace maker present.  LUNGS: Lungs are clear to  auscultation bilaterally, no apparent distress ABDOMEN: soft, nontender, no rebound or guarding GU:no cva tenderness NEURO: Pt is awake/alert, moves all extremitiesx4 EXTREMITIES: pulses normal, full ROM SKIN: warm, color normal PSYCH: no abnormalities of mood noted   Assessment & Plan:  Patient struck the left for head on what she thinks is a doorknob at her apartment. It is unclear whether she had a loss of consciousness with it. Nurse where she stays placed Steri-Strips over the wound. She enters today to have a wound check. Pertinent history reveals the patient is on Coumadin for atrial fibrillation. She has had oozing from the wound. I chose to not remove the Steri-Strips call EMS for transport so she could have an emergent CT because the patient is on Coumadin. She has had some nausea but had no other focal neurological signs on examination.  I personally performed the services described in this documentation, which was scribed in my presence. The recorded information has been reviewed and is accurate.

## 2014-06-30 ENCOUNTER — Encounter (HOSPITAL_COMMUNITY): Payer: Self-pay | Admitting: *Deleted

## 2014-06-30 DIAGNOSIS — Z9181 History of falling: Secondary | ICD-10-CM | POA: Diagnosis not present

## 2014-06-30 DIAGNOSIS — F329 Major depressive disorder, single episode, unspecified: Secondary | ICD-10-CM | POA: Diagnosis present

## 2014-06-30 DIAGNOSIS — Z87891 Personal history of nicotine dependence: Secondary | ICD-10-CM | POA: Diagnosis not present

## 2014-06-30 DIAGNOSIS — I10 Essential (primary) hypertension: Secondary | ICD-10-CM | POA: Diagnosis present

## 2014-06-30 DIAGNOSIS — Z86718 Personal history of other venous thrombosis and embolism: Secondary | ICD-10-CM | POA: Diagnosis not present

## 2014-06-30 DIAGNOSIS — R55 Syncope and collapse: Secondary | ICD-10-CM | POA: Diagnosis not present

## 2014-06-30 DIAGNOSIS — I059 Rheumatic mitral valve disease, unspecified: Secondary | ICD-10-CM | POA: Diagnosis present

## 2014-06-30 DIAGNOSIS — Z7901 Long term (current) use of anticoagulants: Secondary | ICD-10-CM | POA: Diagnosis not present

## 2014-06-30 DIAGNOSIS — I359 Nonrheumatic aortic valve disorder, unspecified: Secondary | ICD-10-CM | POA: Diagnosis present

## 2014-06-30 DIAGNOSIS — F3289 Other specified depressive episodes: Secondary | ICD-10-CM | POA: Diagnosis present

## 2014-06-30 DIAGNOSIS — Z8673 Personal history of transient ischemic attack (TIA), and cerebral infarction without residual deficits: Secondary | ICD-10-CM | POA: Diagnosis not present

## 2014-06-30 DIAGNOSIS — I495 Sick sinus syndrome: Secondary | ICD-10-CM | POA: Diagnosis not present

## 2014-06-30 DIAGNOSIS — I369 Nonrheumatic tricuspid valve disorder, unspecified: Secondary | ICD-10-CM

## 2014-06-30 DIAGNOSIS — Z950 Presence of cardiac pacemaker: Secondary | ICD-10-CM | POA: Diagnosis not present

## 2014-06-30 DIAGNOSIS — I4891 Unspecified atrial fibrillation: Secondary | ICD-10-CM | POA: Diagnosis not present

## 2014-06-30 LAB — PROTIME-INR
INR: 2.65 — AB (ref 0.00–1.49)
Prothrombin Time: 28.3 seconds — ABNORMAL HIGH (ref 11.6–15.2)

## 2014-06-30 LAB — BASIC METABOLIC PANEL
ANION GAP: 12 (ref 5–15)
BUN: 16 mg/dL (ref 6–23)
CALCIUM: 8.8 mg/dL (ref 8.4–10.5)
CO2: 23 meq/L (ref 19–32)
Chloride: 110 mEq/L (ref 96–112)
Creatinine, Ser: 0.85 mg/dL (ref 0.50–1.10)
GFR calc Af Amer: 74 mL/min — ABNORMAL LOW (ref >90)
GFR calc non Af Amer: 63 mL/min — ABNORMAL LOW (ref >90)
Glucose, Bld: 97 mg/dL (ref 70–99)
Potassium: 4.5 mEq/L (ref 3.7–5.3)
Sodium: 145 mEq/L (ref 137–147)

## 2014-06-30 LAB — CBC
HEMATOCRIT: 39.3 % (ref 36.0–46.0)
Hemoglobin: 13.2 g/dL (ref 12.0–15.0)
MCH: 32.5 pg (ref 26.0–34.0)
MCHC: 33.6 g/dL (ref 30.0–36.0)
MCV: 96.8 fL (ref 78.0–100.0)
Platelets: 116 10*3/uL — ABNORMAL LOW (ref 150–400)
RBC: 4.06 MIL/uL (ref 3.87–5.11)
RDW: 14.8 % (ref 11.5–15.5)
WBC: 4.9 10*3/uL (ref 4.0–10.5)

## 2014-06-30 MED ORDER — BISOPROLOL FUMARATE 5 MG PO TABS: 2.5000 mg | ORAL_TABLET | Freq: Every day | ORAL | Status: DC

## 2014-06-30 NOTE — ED Provider Notes (Signed)
Medical screening examination/treatment/procedure(s) were performed by non-physician practitioner and as supervising physician I was immediately available for consultation/collaboration.   EKG Interpretation   Date/Time:  Monday June 29 2014 14:44:07 EDT Ventricular Rate:  83 PR Interval:  156 QRS Duration: 131 QT Interval:  458 QTC Calculation: 538 R Axis:   97 Text Interpretation:  A-V dual-paced complexes w/ some inhibition No  further analysis attempted due to paced rhythm Since last tracing AV  pacing has replaced NSR Confirmed by Eulis Foster  MD, Brette Cast [id number]) on  06/29/2014 2:51:46 PM       Richarda Blade, MD 06/30/14 [phone]

## 2014-06-30 NOTE — Discharge Instructions (Signed)
You have been evaluated for your fall.  No broken bones noted.  Your coumadin level is a bit higher than desired.  Please hold off on taking your coumadin today, resume tomorrow and follow up with your doctor.  Take tylenol for pain as home as needed.    Facial Laceration  A facial laceration is a cut on the face. These injuries can be painful and cause bleeding. Lacerations usually heal quickly, but they need special care to reduce scarring. DIAGNOSIS  Your health care provider will take a medical history, ask for details about how the injury occurred, and examine the wound to determine how deep the cut is. TREATMENT  Some facial lacerations may not require closure. Others may not be able to be closed because of an increased risk of infection. The risk of infection and the chance for successful closure will depend on various factors, including the amount of time since the injury occurred. The wound may be cleaned to help prevent infection. If closure is appropriate, pain medicines may be given if needed. Your health care provider will use stitches (sutures), wound glue (adhesive), or skin adhesive strips to repair the laceration. These tools bring the skin edges together to allow for faster healing and a better cosmetic outcome. If needed, you may also be given a tetanus shot. HOME CARE INSTRUCTIONS  Only take over-the-counter or prescription medicines as directed by your health care provider.  Follow your health care provider's instructions for wound care. These instructions will vary depending on the technique used for closing the wound. For Sutures:  Keep the wound clean and dry.   If you were given a bandage (dressing), you should change it at least once a day. Also change the dressing if it becomes wet or dirty, or as directed by your health care provider.   Wash the wound with soap and water 2 times a day. Rinse the wound off with water to remove all soap. Pat the wound dry with a clean  towel.   After cleaning, apply a thin layer of the antibiotic ointment recommended by your health care provider. This will help prevent infection and keep the dressing from sticking.   You may shower as usual after the first 24 hours. Do not soak the wound in water until the sutures are removed.   Get your sutures removed as directed by your health care provider. With facial lacerations, sutures should usually be taken out after 4-5 days to avoid stitch marks.   Wait a few days after your sutures are removed before applying any makeup. For Skin Adhesive Strips:  Keep the wound clean and dry.   Do not get the skin adhesive strips wet. You may bathe carefully, using caution to keep the wound dry.   If the wound gets wet, pat it dry with a clean towel.   Skin adhesive strips will fall off on their own. You may trim the strips as the wound heals. Do not remove skin adhesive strips that are still stuck to the wound. They will fall off in time.  For Wound Adhesive:  You may briefly wet your wound in the shower or bath. Do not soak or scrub the wound. Do not swim. Avoid periods of heavy sweating until the skin adhesive has fallen off on its own. After showering or bathing, gently pat the wound dry with a clean towel.   Do not apply liquid medicine, cream medicine, ointment medicine, or makeup to your wound while the skin adhesive  is in place. This may loosen the film before your wound is healed.   If a dressing is placed over the wound, be careful not to apply tape directly over the skin adhesive. This may cause the adhesive to be pulled off before the wound is healed.   Avoid prolonged exposure to sunlight or tanning lamps while the skin adhesive is in place.  The skin adhesive will usually remain in place for 5-10 days, then naturally fall off the skin. Do not pick at the adhesive film.  After Healing: Once the wound has healed, cover the wound with sunscreen during the day for 1  full year. This can help minimize scarring. Exposure to ultraviolet light in the first year will darken the scar. It can take 1-2 years for the scar to lose its redness and to heal completely.  SEEK IMMEDIATE MEDICAL CARE IF:  You have redness, pain, or swelling around the wound.   You see ayellowish-white fluid (pus) coming from the wound.   You have chills or a fever.  MAKE SURE YOU:  Understand these instructions.  Will watch your condition.  Will get help right away if you are not doing well or get worse. Document Released: 12/21/2004 Document Revised: 09/03/2013 Document Reviewed: 06/26/2013 [facility] Patient Information 2015 [place], [place]. This information is not intended to replace advice given to you by your health care provider. Make sure you discuss any questions you have with your health care provider.  Sterile Tape Wound Care Some cuts and wounds can be closed using sterile tape, also called skin adhesive strips. Skin adhesive strips can be used for shallow (superficial) and simple cuts, wounds, lacerations, and surgical incisions. These strips act in place of stitches to hold the edges of the wound together, allowing for faster healing. Unlike stitches, the adhesive strips do not require needles or anesthetic medicine for placement. The strips will wear off naturally as the wound is healing. It is important to take proper care of your wound at home while it heals.  HOME CARE INSTRUCTIONS  Try to keep the area around your wound clean and dry. Do not allow the adhesive strips to get wet for the first 12 hours.   Do not use any soaps or ointments on the wound for the first 12 hours.   If a bandage (dressing) has been applied, follow your health care provider's instructions for how often to change the dressing. Keep the dressing dry if one has been applied.   Do not remove the adhesive strips. They will fall off on their own. If they do not, you may remove them gently  after 10 days. You should gently wet the strips before removing them. For example, this can be done in the shower.  Do not scratch, pick, or rub the wound area.   Protect the wound from further injury until it is healed.   Protect the wound from sun and tanning bed exposure while it is healing and for several weeks after healing.   Only take over-the-counter or prescription medicines as directed by your health care provider.   Keep all follow-up appointments as directed by your health care provider.  SEEK MEDICAL CARE IF: Your adhesive strips become wet or soaked with blood before the wound has healed. The tape will need to be replaced.  SEEK IMMEDIATE MEDICAL CARE IF:  You have increasing pain in the wound.   You develop a rash after the strips are applied.  Your wound becomes red, swollen, hot,  or tender.   You have a red streak that goes away from the wound.   You have pus coming from the wound.   You have increased bleeding from the wound.  You notice a bad smell coming from the wound.   Your wound breaks open. MAKE SURE YOU:  Understand these instructions.  Will watch your condition.  Will get help right away if you are not doing well or get worse. Document Released: 12/21/2004 Document Revised: 09/03/2013 Document Reviewed: 06/04/2013 [facility] Patient Information 2015 [place], [place]. This information is not intended to replace advice given to you by your health care provider. Make sure you discuss any questions you have with your health care provider.   Atrial Fibrillation Atrial fibrillation is a condition that causes your heart to beat irregularly. It may also cause your heart to beat faster than normal. Atrial fibrillation can prevent your heart from pumping blood normally. It increases your risk of stroke and heart problems. HOME CARE  Take medications as told by your doctor.  Only take medications that your doctor says are safe. Some medications  can make the condition worse or happen again.  If blood thinners were prescribed by your doctor, take them exactly as told. Too much can cause bleeding. Too little and you will not have the needed protection against stroke and other problems.  Perform blood tests at home if told by your doctor.  Perform blood tests exactly as told by your doctor.  Do not drink alcohol.  Do not drink beverages with caffeine such as coffee, soda, and some teas.  Maintain a healthy weight.  Do not use diet pills unless your doctor says they are safe. They may make heart problems worse.  Follow diet instructions as told by your doctor.  Exercise regularly as told by your doctor.  Keep all follow-up appointments. GET HELP IF:  You notice a change in the speed, rhythm, or strength of your heartbeat.  You suddenly begin peeing (urinating) more often.  You get tired more easily when moving or exercising. GET HELP RIGHT AWAY IF:   You have chest or belly (abdominal) pain.  You feel sick to your stomach (nauseous).  You are short of breath.  You suddenly have swollen feet and ankles.  You feel dizzy.  You face, arms, or legs feel numb or weak.  There is a change in your vision or speech. MAKE SURE YOU:   Understand these instructions.  Will watch your condition.  Will get help right away if you are not doing well or get worse. Document Released: 08/22/2008 Document Revised: 03/30/2014 Document Reviewed: 12/24/2012 [facility] Patient Information 2015 [place], [place]. This information is not intended to replace advice given to you by your health care provider. Make sure you discuss any questions you have with your health care provider.

## 2014-06-30 NOTE — Progress Notes (Signed)
UR completed 

## 2014-06-30 NOTE — Progress Notes (Signed)
Pt ambulated in the hallway with supervision, heart rate up to the 130s with PVCs but pt asymptomatic. As soon as she stopped walking or sat down, her heart rate came back down in the 70s. I notified Museum/gallery conservator, RN with EP

## 2014-06-30 NOTE — Progress Notes (Signed)
  Echocardiogram 2D Echocardiogram has been performed.  [name] [name] 06/30/2014, 3:08 PM

## 2014-06-30 NOTE — ED Provider Notes (Signed)
Medical screening examination/treatment/procedure(s) were performed by non-physician practitioner and as supervising physician I was immediately available for consultation/collaboration.   EKG Interpretation   Date/Time:  Monday June 29 2014 14:44:07 EDT Ventricular Rate:  83 PR Interval:  156 QRS Duration: 131 QT Interval:  458 QTC Calculation: 538 R Axis:   97 Text Interpretation:  A-V dual-paced complexes w/ some inhibition No  further analysis attempted due to paced rhythm Since last tracing AV  pacing has replaced NSR Confirmed by Eulis Foster  MD, Jonovan Boedecker [phone]) on  06/29/2014 2:51:46 PM       Richarda Blade, MD 06/30/14 [phone]

## 2014-06-30 NOTE — Consult Note (Signed)
ELECTROPHYSIOLOGY CONSULT NOTE    Patient ID: IVETT HELTSLEY MRN: [id number], DOB/AGE: 78-Apr-1933 78 y.o.  Admit date: 06/29/2014 Date of Consult: 06-30-2014  Primary Physician: Mathews Argyle, MD Primary Cardiologist: Johnsie Cancel Electrophysiologist: Caryl Comes  Reason for Consultation: syncope  HPI:  [name] [name] is a 80 y.o. female with a past medical history significant for sinus node dysfunction (s/p MDT dual chamber pacemaker 2006), atrial fibrillation (on Rhythmol and Coumadin), hypertension, and prior CVA.  She reports that 2 weeks ago she had a mechanical fall where she missed the curb and chipped her teethand aandandand is in.  Then Sunday night, she remembers eating lunch, going outside and drinking 2 glasses of wine, coming back inside and the phone started ringing. She remembers looking for the phone and the next thing she remembers she was sitting in a chair having hit her forehead and chipped a tooth.  She presented to an Urgent Care and was referred to [facility] for further evaluation.    Pacemaker interrogation demonstrates normal battery and lead measurements.  4.6% atrial fibrillation since 03-2014; ventricular rates during atrial fibrillation 100-140's predominately.  No VHR episodes detected at time of fall Sunday night (patient reports 6:45 PM).    Last echo 2009 demonstrated EF 55%, hypokinesis of basal-mid posterolateral wall, mild to moderate MR.  Repeat echo is pending this admission.  She had previously been on Cardizem daily but changed to prn dosing due to LE edema.  She has been on beta blockers in the past but unable to tolerate due to fatigue.   She denies recent chest pain, shortness of breath, dizziness or other episodes of pre-syncope.  She states that she has been having increased atrial fibrillation recently, however, device interrogation demonstrates that she has been in SR predominately since mid June 2015 with the exception of the past few days.   She also states that she has been having increased GI disturbance with frequent stools following meals.  ROS is otherwise negative.   Past Medical History  Diagnosis Date  . Sinoatrial node dysfunction   . Mitral valve regurgitation     a. echo 2009 mild to moderate MR  . Edema   . Atrial fibrillation   . Unspecified essential hypertension   . Depression   . CVA     Lacunar and cerebellar infarct  . DVT (deep venous thrombosis)     Details not available in the chart     Surgical History:  Past Surgical History  Procedure Laterality Date  . Pacemaker insertion  09-14-2005    MDT EnRhythm dual chamber pacemaker implanted by Dr Lovena Le for SND     Prescriptions prior to admission  Medication Sig Dispense Refill  . Biotin (BIOTIN 5000) 5 MG CAPS Take 5 mg by mouth daily.      . Cholecalciferol (VITAMIN D3) 2000 UNITS TABS Take 2,000 Units by mouth daily.       . COCONUT OIL PO Take 4 capsules by mouth daily.      . Cyanocobalamin (VITAMIN B-12 PO) Take 1 tablet by mouth daily.       . folic acid (FOLVITE) 1 MG tablet Take 1 mg by mouth daily.       Marland Kitchen LORazepam (ATIVAN) 0.5 MG tablet Take 0.5 mg by mouth as needed for sleep.       . propafenone (RYTHMOL) 225 MG tablet Take 225 mg by mouth 2 (two) times daily.      . sertraline (ZOLOFT) 50 MG tablet Take  50 mg by mouth daily.       Marland Kitchen warfarin (COUMADIN) 5 MG tablet Take 2.5-5 mg by mouth daily. Take 2.5 daily except on Mondays and Wednesdays, take 5 mg      . allopurinol (ZYLOPRIM) 100 MG tablet Take 100 mg by mouth daily as needed (for gout flares).      . colchicine (COLCRYS) 0.6 MG tablet Take 0.6 mg by mouth daily as needed (for gout flares).         Inpatient Medications:  . propafenone  225 mg Oral BID  . sertraline  50 mg Oral Daily    Allergies:  Allergies  Allergen Reactions  . Itraconazole Diarrhea and Itching    History   Social History  . Marital Status: Widowed    Spouse Name: N/A    Number of Children:  N/A  . Years of Education: N/A   Occupational History  . Not on file.   Social History Main Topics  . Smoking status: Former Smoker    Quit date: 08/13/1984  . Smokeless tobacco: Never Used  . Alcohol Use: 8.4 oz/week    14 Glasses of wine per week  . Drug Use: No  . Sexual Activity: Not Currently   Other Topics Concern  . Not on file   Social History Narrative  . No narrative on file     Family History  Problem Relation Age of Onset  . Cancer Mother   . Mitral valve prolapse Mother   . Diabetes Father   . Hypertension Father   . Coronary artery disease Sister   . Stroke Brother   . Coronary artery disease Sister     BP 169/66  Pulse 69  Temp(Src) 97.7 F (36.5 C) (Oral)  Resp 18  Ht 5\' 5"  (1.651 m)  Wt 152 lb (68.947 kg)  BMI 25.29 kg/m2  SpO2 98%  Physical Exam: Well appearing 80 yo woman, multiple ecchymosis, NAD HEENT: multiple ecchymosis over the left eye,  Neck:  6 JVD, no thyromegally Back:  No CVA tenderness Lungs:  Clear with no wheezes, rales, or rhonchi HEART:  Regular rate rhythm, no murmurs, no rubs, no clicks Abd:  soft, positive bowel sounds, no organomegally, no rebound, no guarding Ext:  2 plus pulses, no edema, no cyanosis, no clubbing Skin:  No rashes no nodules Neuro:  CN II through XII intact, motor grossly intact   Labs:   Lab Results  Component Value Date   WBC 4.9 06/30/2014   HGB 13.2 06/30/2014   HCT 39.3 06/30/2014   MCV 96.8 06/30/2014   PLT 116* 06/30/2014     Recent Labs Lab 06/30/14 0625  NA 145  K 4.5  CL 110  CO2 23  BUN 16  CREATININE 0.85  CALCIUM 8.8  GLUCOSE 97   Radiology/Studies: Ct Head Wo Contrast 06/29/2014   ADDENDUM REPORT: 06/29/2014 15:31  ADDENDUM: Cervical spine CT report should read as follows :  No soft tissue swelling. Minimal anterior subluxation of C4 on C5 is present, most likely degenerative. Ligamentous injury cannot be excluded. No evidence of acute fracture or dislocation. Biapical pleural  parenchymal thickening noted consistent with scarring.  IMPRESSION: Minimal anterior subluxation of C4 on C5, most likely degenerative. Ligamentous injury cannot be excluded. No evidence of fracture or dislocation.   Electronically Signed   By: Marcello Moores  Register   On: 06/29/2014 15:31   EKG: nsr with atrial pacing  TELEMETRY: AF with RVR and aberrancy followed by atrial pacing with  intrinsic ventricular conduction  DEVICE HISTORY: 4% atrial fibrillation, no VT, episodes of atrial fib with an RVR, normal lead function.  A/P 1. Syncope 2. H/o sinus node dysfunction, s/p PPM 3. Atrial fib with an RVR with patient in NSR over 95% 4. Trauma to left eye 5. Preserved LV function Rec: etiology of her syncope is unclear but likely multifactorial. While she could have developed a rapid VR leading to hypotension and subsequent syncope, I suspect her atrial fib was only playing a role. She has had other episodes of atrial fib whic were asymptomatic. She could also have merely tripped and developed retrograde amnesia initially after falling. I would recommend that she be allowed to ambulate in the halls and if ok, be allowed to go home. I would like to add low dose bisoprolol. She will follow up with Dr. Renaldo Reel.  Cristopher Peru, M.D.

## 2014-06-30 NOTE — Discharge Summary (Signed)
Discharge Summary   Patient ID: [name] [name],  MRN: [id number], DOB/AGE: 06/05/1932 80 y.o.  Admit date: 06/29/2014 Discharge date: 06/30/2014  Primary Care Provider: Mathews Argyle Primary Cardiologist: Dr. Lolita Rieger  Discharge Diagnoses Principal Problem:   Frequent falls Active Problems:   Mixed hyperlipidemia   Essential hypertension   PACEMAKER-Medtronic   PAF (paroxysmal atrial fibrillation)   SVT (supraventricular tachycardia)   Chronic anticoagulation   Pacemaker   Allergies Allergies  Allergen Reactions  . Itraconazole Diarrhea and Itching    Procedures  Echocardiogram 06/30/2014 result pending  Hospital Course  The patient is a 80 year old female with past medical history significant for paroxysmal atrial fibrillation on antiarrhythmic therapy with Rythmol and Coumadin. She also had a history of sinus node dysfunction status post Medtronic pacemaker placement. She presented to [facility] on 06/29/2014 after being evaluated at the urgent care facility for laceration she sustained to the left face after sustaining a fall around 7 PM on 06/28/2014. According to the patient, this is the second fall she sustained in 3 weeks. She denies any loss of consciousness. She denied any warning symptoms prior to the event including no palpitation, lightheadedness or dizziness. After presenting to the urgent care facility, she was referred to [facility] for a CT of the head to rule out intracranial bleeding in the setting of chronic anticoagulation therapy. CT of the head was negative for any sign of bleeding. In the ED, she was noted to have a run of SVT in the 140s. Subsequently her pacemaker was interrogated which revealed she had frequent episodes of atrial fibrillation with rates as high as 180s. Cardiology was consulted for evaluation of paroxysmal atrial fibrillation. TSH was obtained which actually was mildly elevated at 6.14. Electrophysiology service was  consulted for evaluation of the patient's paroxysmal atrial fibrillation. Dr. Lovena Le of electrophysiology believe the patient's fall was likely multifactorial although paroxysmal atrial fibrillation may have played a factor. A low-dose metoprolol was added to her medical regimen. Patient is deemed stable for discharge from cardiology perspective.  She will followup with Dr. Virl Axe in the office, during which visit, TSH and free T4 will be repeated. She has echocardiogram done on 06/30/2014, preliminary result was normal by verbal report. Final echocardiogram report is pending at this time.  Discharge Vitals Blood pressure 136/59, pulse 65, temperature 97.5 F (36.4 C), temperature source Oral, resp. rate 17, height 5\' 5"  (1.651 m), weight 152 lb (68.947 kg), SpO2 99.00%.  Filed Weights   06/29/14 1337 06/29/14 2012  Weight: 155 lb (70.308 kg) 152 lb (68.947 kg)    Labs  CBC  Recent Labs  06/29/14 1433 06/29/14 1448 06/30/14 0625  WBC 7.7  --  4.9  NEUTROABS 4.6  --   --   HGB 14.7 16.0* 13.2  HCT 43.9 47.0* 39.3  MCV 95.6  --  96.8  PLT 141*  --  99991111*   Basic Metabolic Panel  Recent Labs  06/29/14 1448 06/30/14 0625  NA 141 145  K 4.0 4.5  CL 109 110  CO2  --  23  GLUCOSE 96 97  BUN 20 16  CREATININE 1.00 0.85  CALCIUM  --  8.8   Thyroid Function Tests  Recent Labs  06/29/14 2145  TSH 6.140*    Disposition  Pt is being discharged home today in good condition.  Follow-up Plans & Appointments      Follow-up Information   Follow up with Mathews Argyle, MD In 1 week. (As needed,  If symptoms worsen)    Specialty:  Internal Medicine   Contact information:   [address] [place] [address] [phone]       Follow up with [facility] On 07/07/2014. (Check PT/INR. 11:45am)    Contact information:   [address] [address]       Follow up with Virl Axe, MD On 07/22/2014. (4:00pm)     Specialty:  Cardiology   Contact information:   [id number] [address] [phone]       Discharge Medications    Medication List         allopurinol 100 MG tablet  Commonly known as:  ZYLOPRIM  Take 100 mg by mouth daily as needed (for gout flares).     BIOTIN 5000 5 MG Caps  Generic drug:  Biotin  Take 5 mg by mouth daily.     bisoprolol 5 MG tablet  Commonly known as:  ZEBETA  Take 0.5 tablets (2.5 mg total) by mouth daily.     COCONUT OIL PO  Take 4 capsules by mouth daily.     COLCRYS 0.6 MG tablet  Generic drug:  colchicine  Take 0.6 mg by mouth daily as needed (for gout flares).     folic acid 1 MG tablet  Commonly known as:  FOLVITE  Take 1 mg by mouth daily.     LORazepam 0.5 MG tablet  Commonly known as:  ATIVAN  Take 0.5 mg by mouth as needed for sleep.     propafenone 225 MG tablet  Commonly known as:  RYTHMOL  Take 225 mg by mouth 2 (two) times daily.     sertraline 50 MG tablet  Commonly known as:  ZOLOFT  Take 50 mg by mouth daily.     VITAMIN B-12 PO  Take 1 tablet by mouth daily.     Vitamin D3 2000 UNITS Tabs  Take 2,000 Units by mouth daily.     warfarin 5 MG tablet  Commonly known as:  COUMADIN  Take 2.5-5 mg by mouth daily. Take 2.5 daily except on Mondays and Wednesdays, take 5 mg        Outstanding Labs/Studies  TSH and Free T4 on followup (elevated TSH during admission)  Duration of Discharge Encounter   Greater than 30 minutes including physician time.  Signed, Cristopher Peru PA-C 06/30/2014, 11:26 PM

## 2014-07-01 ENCOUNTER — Ambulatory Visit: Payer: Medicare Other | Admitting: Cardiovascular Disease

## 2014-07-03 ENCOUNTER — Telehealth: Payer: Self-pay

## 2014-07-03 ENCOUNTER — Other Ambulatory Visit: Payer: Self-pay

## 2014-07-03 DIAGNOSIS — Z742 Need for assistance at home and no other household member able to render care: Secondary | ICD-10-CM

## 2014-07-03 NOTE — Telephone Encounter (Signed)
Received call from Rhys Martini with [facility]; she is needing a referral for [place]. I put in a referral for [facility] per Dr. Everlene Farrier.  Tiffany's cell is [phone] and fax is [phone]

## 2014-07-04 DIAGNOSIS — I4891 Unspecified atrial fibrillation: Secondary | ICD-10-CM | POA: Diagnosis not present

## 2014-07-04 DIAGNOSIS — Z86718 Personal history of other venous thrombosis and embolism: Secondary | ICD-10-CM | POA: Diagnosis not present

## 2014-07-04 DIAGNOSIS — Z7901 Long term (current) use of anticoagulants: Secondary | ICD-10-CM | POA: Diagnosis not present

## 2014-07-04 DIAGNOSIS — F3289 Other specified depressive episodes: Secondary | ICD-10-CM | POA: Diagnosis not present

## 2014-07-04 DIAGNOSIS — Z8673 Personal history of transient ischemic attack (TIA), and cerebral infarction without residual deficits: Secondary | ICD-10-CM | POA: Diagnosis not present

## 2014-07-04 DIAGNOSIS — Z5189 Encounter for other specified aftercare: Secondary | ICD-10-CM | POA: Diagnosis not present

## 2014-07-04 DIAGNOSIS — S0180XA Unspecified open wound of other part of head, initial encounter: Secondary | ICD-10-CM | POA: Diagnosis not present

## 2014-07-04 DIAGNOSIS — F329 Major depressive disorder, single episode, unspecified: Secondary | ICD-10-CM | POA: Diagnosis not present

## 2014-07-04 DIAGNOSIS — Z950 Presence of cardiac pacemaker: Secondary | ICD-10-CM | POA: Diagnosis not present

## 2014-07-04 DIAGNOSIS — Z9181 History of falling: Secondary | ICD-10-CM | POA: Diagnosis not present

## 2014-07-04 DIAGNOSIS — I10 Essential (primary) hypertension: Secondary | ICD-10-CM | POA: Diagnosis not present

## 2014-07-06 DIAGNOSIS — I10 Essential (primary) hypertension: Secondary | ICD-10-CM | POA: Diagnosis not present

## 2014-07-06 DIAGNOSIS — I4891 Unspecified atrial fibrillation: Secondary | ICD-10-CM | POA: Diagnosis not present

## 2014-07-06 DIAGNOSIS — Z9181 History of falling: Secondary | ICD-10-CM | POA: Diagnosis not present

## 2014-07-06 DIAGNOSIS — Z86718 Personal history of other venous thrombosis and embolism: Secondary | ICD-10-CM | POA: Diagnosis not present

## 2014-07-06 DIAGNOSIS — F329 Major depressive disorder, single episode, unspecified: Secondary | ICD-10-CM | POA: Diagnosis not present

## 2014-07-06 DIAGNOSIS — S0180XA Unspecified open wound of other part of head, initial encounter: Secondary | ICD-10-CM | POA: Diagnosis not present

## 2014-07-06 DIAGNOSIS — F3289 Other specified depressive episodes: Secondary | ICD-10-CM | POA: Diagnosis not present

## 2014-07-07 ENCOUNTER — Ambulatory Visit (INDEPENDENT_AMBULATORY_CARE_PROVIDER_SITE_OTHER): Payer: Medicare Other | Admitting: Pharmacist

## 2014-07-07 DIAGNOSIS — Z5181 Encounter for therapeutic drug level monitoring: Secondary | ICD-10-CM

## 2014-07-07 DIAGNOSIS — I4891 Unspecified atrial fibrillation: Secondary | ICD-10-CM | POA: Diagnosis not present

## 2014-07-07 DIAGNOSIS — I82409 Acute embolism and thrombosis of unspecified deep veins of unspecified lower extremity: Secondary | ICD-10-CM

## 2014-07-07 LAB — POCT INR: INR: 2

## 2014-07-09 DIAGNOSIS — I10 Essential (primary) hypertension: Secondary | ICD-10-CM | POA: Diagnosis not present

## 2014-07-09 DIAGNOSIS — Z9181 History of falling: Secondary | ICD-10-CM | POA: Diagnosis not present

## 2014-07-09 DIAGNOSIS — Z86718 Personal history of other venous thrombosis and embolism: Secondary | ICD-10-CM | POA: Diagnosis not present

## 2014-07-09 DIAGNOSIS — I4891 Unspecified atrial fibrillation: Secondary | ICD-10-CM | POA: Diagnosis not present

## 2014-07-09 DIAGNOSIS — F329 Major depressive disorder, single episode, unspecified: Secondary | ICD-10-CM | POA: Diagnosis not present

## 2014-07-09 DIAGNOSIS — S0180XA Unspecified open wound of other part of head, initial encounter: Secondary | ICD-10-CM | POA: Diagnosis not present

## 2014-07-09 DIAGNOSIS — F3289 Other specified depressive episodes: Secondary | ICD-10-CM | POA: Diagnosis not present

## 2014-07-14 DIAGNOSIS — Z86718 Personal history of other venous thrombosis and embolism: Secondary | ICD-10-CM | POA: Diagnosis not present

## 2014-07-14 DIAGNOSIS — F329 Major depressive disorder, single episode, unspecified: Secondary | ICD-10-CM | POA: Diagnosis not present

## 2014-07-14 DIAGNOSIS — F3289 Other specified depressive episodes: Secondary | ICD-10-CM | POA: Diagnosis not present

## 2014-07-14 DIAGNOSIS — Z9181 History of falling: Secondary | ICD-10-CM | POA: Diagnosis not present

## 2014-07-14 DIAGNOSIS — S0180XA Unspecified open wound of other part of head, initial encounter: Secondary | ICD-10-CM | POA: Diagnosis not present

## 2014-07-14 DIAGNOSIS — I10 Essential (primary) hypertension: Secondary | ICD-10-CM | POA: Diagnosis not present

## 2014-07-14 DIAGNOSIS — I4891 Unspecified atrial fibrillation: Secondary | ICD-10-CM | POA: Diagnosis not present

## 2014-07-16 DIAGNOSIS — S0180XA Unspecified open wound of other part of head, initial encounter: Secondary | ICD-10-CM | POA: Diagnosis not present

## 2014-07-16 DIAGNOSIS — F329 Major depressive disorder, single episode, unspecified: Secondary | ICD-10-CM | POA: Diagnosis not present

## 2014-07-16 DIAGNOSIS — I10 Essential (primary) hypertension: Secondary | ICD-10-CM | POA: Diagnosis not present

## 2014-07-16 DIAGNOSIS — Z9181 History of falling: Secondary | ICD-10-CM | POA: Diagnosis not present

## 2014-07-16 DIAGNOSIS — Z86718 Personal history of other venous thrombosis and embolism: Secondary | ICD-10-CM | POA: Diagnosis not present

## 2014-07-16 DIAGNOSIS — I4891 Unspecified atrial fibrillation: Secondary | ICD-10-CM | POA: Diagnosis not present

## 2014-07-16 DIAGNOSIS — F3289 Other specified depressive episodes: Secondary | ICD-10-CM | POA: Diagnosis not present

## 2014-07-17 ENCOUNTER — Other Ambulatory Visit: Payer: Self-pay | Admitting: Cardiovascular Disease

## 2014-07-17 ENCOUNTER — Other Ambulatory Visit: Payer: Self-pay | Admitting: Internal Medicine

## 2014-07-21 DIAGNOSIS — S0180XA Unspecified open wound of other part of head, initial encounter: Secondary | ICD-10-CM | POA: Diagnosis not present

## 2014-07-21 DIAGNOSIS — I10 Essential (primary) hypertension: Secondary | ICD-10-CM | POA: Diagnosis not present

## 2014-07-21 DIAGNOSIS — Z9181 History of falling: Secondary | ICD-10-CM | POA: Diagnosis not present

## 2014-07-21 DIAGNOSIS — F3289 Other specified depressive episodes: Secondary | ICD-10-CM | POA: Diagnosis not present

## 2014-07-21 DIAGNOSIS — Z86718 Personal history of other venous thrombosis and embolism: Secondary | ICD-10-CM | POA: Diagnosis not present

## 2014-07-21 DIAGNOSIS — F329 Major depressive disorder, single episode, unspecified: Secondary | ICD-10-CM | POA: Diagnosis not present

## 2014-07-21 DIAGNOSIS — I4891 Unspecified atrial fibrillation: Secondary | ICD-10-CM | POA: Diagnosis not present

## 2014-07-22 ENCOUNTER — Encounter: Payer: Self-pay | Admitting: Internal Medicine

## 2014-07-22 ENCOUNTER — Ambulatory Visit (INDEPENDENT_AMBULATORY_CARE_PROVIDER_SITE_OTHER): Payer: Medicare Other | Admitting: *Deleted

## 2014-07-22 ENCOUNTER — Ambulatory Visit (INDEPENDENT_AMBULATORY_CARE_PROVIDER_SITE_OTHER): Payer: Medicare Other | Admitting: Internal Medicine

## 2014-07-22 VITALS — BP 162/80 | HR 50 | Ht 65.0 in | Wt 135.8 lb

## 2014-07-22 DIAGNOSIS — I480 Paroxysmal atrial fibrillation: Secondary | ICD-10-CM

## 2014-07-22 DIAGNOSIS — I82409 Acute embolism and thrombosis of unspecified deep veins of unspecified lower extremity: Secondary | ICD-10-CM

## 2014-07-22 DIAGNOSIS — I495 Sick sinus syndrome: Secondary | ICD-10-CM

## 2014-07-22 DIAGNOSIS — Z5181 Encounter for therapeutic drug level monitoring: Secondary | ICD-10-CM

## 2014-07-22 DIAGNOSIS — I4891 Unspecified atrial fibrillation: Secondary | ICD-10-CM | POA: Diagnosis not present

## 2014-07-22 LAB — POCT INR: INR: 4.2

## 2014-07-22 NOTE — Patient Instructions (Addendum)
Your physician recommends that you return for lab work on: Friday 8/28 for T3 & T4  Remote monitoring is used to monitor your pacemaker from home. This monitoring reduces the number of office visits required to check your device to one time per year. It allows [place] to keep an eye on the functioning of your device to ensure it is working properly. You are scheduled for a device check from home on 10-26-2014. You may send your transmission at any time that day. If you have a wireless device, the transmission will be sent automatically. After your physician reviews your transmission, you will receive a postcard with your next transmission date.  Your physician recommends that you schedule a follow-up appointment in: 12 months with Dr.Klein

## 2014-07-22 NOTE — Progress Notes (Signed)
HPI  [name] [name] is a 80 y.o. 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   She is admitted to hospital in early August following an episode of syncope. This followed 2 weeks after she had a fall when she missed the curb. She nicked her teeth at that point A few weeks later she fell again. She had been  having  lunch  and had hada couple of glasses of win. The phone rang and she moved to get the phone. She fell without recollection. However, subsequent observations had shown that the Oriental rub across which she had traversed to get the phone had been slid all the way across the room suggestive of a fall.    Device interrogation demonstrated paroxysms of atrial fibrillation with ventricular rates into the 180s 190s.  She was started on low-dose beta blockers. Previously she did not tolerate metoprolol succinate.    Echocardiogram 8/15 demonstrated normal LV function  Laboratories were notable for mildly elevated TSH  She is on coumadin.             Past Medical History  Diagnosis Date  . Sinoatrial node dysfunction   . Mitral valve regurgitation     a. echo 2009 mild to moderate MR  . Edema   . Atrial fibrillation   . Unspecified essential hypertension   . Depression   . CVA     Lacunar and cerebellar infarct  . DVT (deep venous thrombosis)     Details not available in the chart    Past Surgical History  Procedure Laterality Date  . Pacemaker insertion  09-14-2005    MDT EnRhythm dual chamber pacemaker implanted by Dr [name] [name] for SND    Current Outpatient Prescriptions  Medication Sig Dispense Refill  . allopurinol (ZYLOPRIM) 100 MG tablet Take 100 mg by mouth daily as needed (for gout flares).      . Biotin (BIOTIN 5000) 5 MG CAPS Take 5 mg by mouth daily.      . bisoprolol (ZEBETA) 5 MG tablet Take 0.5 tablets (2.5 mg total) by mouth daily.  30  tablet  6  . Cholecalciferol (VITAMIN D3) 2000 UNITS TABS Take 2,000 Units by mouth daily.       . COCONUT OIL PO Take 4 capsules by mouth daily.      . colchicine (COLCRYS) 0.6 MG tablet Take 0.6 mg by mouth daily as needed (for gout flares).       . Cyanocobalamin (VITAMIN B-12 PO) Take 1 tablet by mouth daily.       Marland Kitchen diltiazem (CARDIZEM) 30 MG tablet TAKE ONE OR TWO TABLETS BY MOUTH EVERY SIX HOURS AS NEEDED FOR PALPITATIONS  60 tablet  0  . folic acid (FOLVITE) 1 MG tablet Take 1 mg by mouth daily.       Marland Kitchen LORazepam (ATIVAN) 0.5 MG tablet Take 0.5 mg by mouth as needed for sleep.       . propafenone (RYTHMOL) 225 MG tablet Take 225 mg by mouth 2 (two) times daily.      . propafenone (RYTHMOL) 225 MG tablet TAKE 1 TABLET BY MOUTH TWICE DAILY  180 tablet  0  . sertraline (ZOLOFT) 50 MG tablet Take 50 mg by mouth daily.       Marland Kitchen warfarin (COUMADIN) 5 MG tablet Take 2.5-5 mg by mouth daily. Take 2.5 daily except on Mondays and Wednesdays, take 5 mg  No current facility-administered medications for this visit.    Allergies  Allergen Reactions  . Itraconazole Diarrhea and Itching    Review of Systems negative except from HPI and PMH  Physical Exam BP 162/80  Pulse 50  Ht 5\' 5"  (1.651 m)  Wt 135 lb 12.8 oz (61.598 kg)  BMI 22.60 kg/m2 Well developed and well nourished in no acute distress HENT normal with some left facial bruising  E scleral and icterus clear Neck Supple JVP flat; carotids brisk and full Clear to ausculation Regular rate and rhythm, no murmurs gallops or rub Soft with active bowel sounds No clubbing cyanosis  edema Alert and oriented, grossly normal motor and sensory function Skin Warm and Dry  ECG demonstrates sinus rhythm at 61 Intervals 17/13/44  right bundle branch block   Assessment and  Plan  Atrial fibrillation-paroxysmal  Hypertension well controlled at home  Sinus node dysfunction  Elevated TSH  Pacemaker-Medtronic The patient's  device was interrogated.  The information was reviewed. No changes were made in the programming.    Falls   It sounds like the episodes were falls as opposed to syncope   The potential trigger suggested by Dr. Lovena [name] for evaluation of rapidly conducted atrial fibrillation was appropriately dressed by the addition of low-dose beta blocker which he has tolerated reasonably well.  Advised that she worked in her house to try to prevent rubs slipping.  Her TSH was elevated; we will check a free T3 and a free T4

## 2014-07-23 LAB — MDC_IDC_ENUM_SESS_TYPE_INCLINIC
Brady Statistic AP VP Percent: 0 %
Brady Statistic AS VS Percent: 45.18 %
Brady Statistic RA Percent Paced: 54.82 %
Brady Statistic RV Percent Paced: 0.01 %
Lead Channel Impedance Value: 400 Ohm
Lead Channel Impedance Value: 664 Ohm
Lead Channel Pacing Threshold Amplitude: 0.5 V
Lead Channel Pacing Threshold Pulse Width: 0.4 ms
Lead Channel Pacing Threshold Pulse Width: 0.4 ms
Lead Channel Sensing Intrinsic Amplitude: 2.0951
Lead Channel Setting Pacing Amplitude: 2 V
Lead Channel Setting Pacing Amplitude: 2.5 V
Lead Channel Setting Pacing Pulse Width: 0.4 ms
Lead Channel Setting Sensing Sensitivity: 0.9 mV
MDC IDC MSMT BATTERY VOLTAGE: 2.93 V
MDC IDC MSMT LEADCHNL RV PACING THRESHOLD AMPLITUDE: 1 V
MDC IDC MSMT LEADCHNL RV SENSING INTR AMPL: 3.3856
MDC IDC SESS DTM: 20150826211522
MDC IDC SET ZONE DETECTION INTERVAL: 350 ms
MDC IDC STAT BRADY AP VS PERCENT: 54.82 %
MDC IDC STAT BRADY AS VP PERCENT: 0.01 %
Zone Setting Detection Interval: 400 ms

## 2014-07-24 ENCOUNTER — Other Ambulatory Visit (INDEPENDENT_AMBULATORY_CARE_PROVIDER_SITE_OTHER): Payer: Medicare Other

## 2014-07-24 DIAGNOSIS — I480 Paroxysmal atrial fibrillation: Secondary | ICD-10-CM

## 2014-07-24 DIAGNOSIS — I4891 Unspecified atrial fibrillation: Secondary | ICD-10-CM

## 2014-07-24 LAB — T3, FREE: T3 FREE: 2.4 pg/mL (ref 2.3–4.2)

## 2014-07-24 LAB — T4, FREE: FREE T4: 0.73 ng/dL (ref 0.60–1.60)

## 2014-07-27 DIAGNOSIS — S0180XA Unspecified open wound of other part of head, initial encounter: Secondary | ICD-10-CM | POA: Diagnosis not present

## 2014-07-27 DIAGNOSIS — Z9181 History of falling: Secondary | ICD-10-CM | POA: Diagnosis not present

## 2014-07-27 DIAGNOSIS — I4891 Unspecified atrial fibrillation: Secondary | ICD-10-CM | POA: Diagnosis not present

## 2014-07-27 DIAGNOSIS — Z86718 Personal history of other venous thrombosis and embolism: Secondary | ICD-10-CM | POA: Diagnosis not present

## 2014-07-27 DIAGNOSIS — F329 Major depressive disorder, single episode, unspecified: Secondary | ICD-10-CM | POA: Diagnosis not present

## 2014-07-27 DIAGNOSIS — I10 Essential (primary) hypertension: Secondary | ICD-10-CM | POA: Diagnosis not present

## 2014-07-27 DIAGNOSIS — F3289 Other specified depressive episodes: Secondary | ICD-10-CM | POA: Diagnosis not present

## 2014-08-06 ENCOUNTER — Ambulatory Visit (INDEPENDENT_AMBULATORY_CARE_PROVIDER_SITE_OTHER): Payer: Medicare Other | Admitting: *Deleted

## 2014-08-06 DIAGNOSIS — I4891 Unspecified atrial fibrillation: Secondary | ICD-10-CM | POA: Diagnosis not present

## 2014-08-06 DIAGNOSIS — I82409 Acute embolism and thrombosis of unspecified deep veins of unspecified lower extremity: Secondary | ICD-10-CM

## 2014-08-06 DIAGNOSIS — Z5181 Encounter for therapeutic drug level monitoring: Secondary | ICD-10-CM

## 2014-08-06 LAB — POCT INR: INR: 3.8

## 2014-08-10 DIAGNOSIS — Z0000 Encounter for general adult medical examination without abnormal findings: Secondary | ICD-10-CM | POA: Diagnosis not present

## 2014-08-10 DIAGNOSIS — F329 Major depressive disorder, single episode, unspecified: Secondary | ICD-10-CM | POA: Diagnosis not present

## 2014-08-10 DIAGNOSIS — Z79899 Other long term (current) drug therapy: Secondary | ICD-10-CM | POA: Diagnosis not present

## 2014-08-10 DIAGNOSIS — F3289 Other specified depressive episodes: Secondary | ICD-10-CM | POA: Diagnosis not present

## 2014-08-10 DIAGNOSIS — Z1331 Encounter for screening for depression: Secondary | ICD-10-CM | POA: Diagnosis not present

## 2014-08-10 DIAGNOSIS — I4891 Unspecified atrial fibrillation: Secondary | ICD-10-CM | POA: Diagnosis not present

## 2014-08-10 DIAGNOSIS — M109 Gout, unspecified: Secondary | ICD-10-CM | POA: Diagnosis not present

## 2014-08-20 ENCOUNTER — Ambulatory Visit (INDEPENDENT_AMBULATORY_CARE_PROVIDER_SITE_OTHER): Payer: Medicare Other

## 2014-08-20 DIAGNOSIS — I4891 Unspecified atrial fibrillation: Secondary | ICD-10-CM | POA: Diagnosis not present

## 2014-08-20 DIAGNOSIS — I82409 Acute embolism and thrombosis of unspecified deep veins of unspecified lower extremity: Secondary | ICD-10-CM | POA: Diagnosis not present

## 2014-08-20 DIAGNOSIS — Z5181 Encounter for therapeutic drug level monitoring: Secondary | ICD-10-CM

## 2014-08-20 LAB — POCT INR: INR: 2.8

## 2014-08-25 DIAGNOSIS — Z23 Encounter for immunization: Secondary | ICD-10-CM | POA: Diagnosis not present

## 2014-09-07 ENCOUNTER — Ambulatory Visit (INDEPENDENT_AMBULATORY_CARE_PROVIDER_SITE_OTHER): Payer: Medicare Other | Admitting: *Deleted

## 2014-09-07 DIAGNOSIS — I4891 Unspecified atrial fibrillation: Secondary | ICD-10-CM

## 2014-09-07 DIAGNOSIS — Z5181 Encounter for therapeutic drug level monitoring: Secondary | ICD-10-CM

## 2014-09-07 DIAGNOSIS — I82409 Acute embolism and thrombosis of unspecified deep veins of unspecified lower extremity: Secondary | ICD-10-CM | POA: Diagnosis not present

## 2014-09-07 LAB — POCT INR: INR: 3.1

## 2014-09-24 ENCOUNTER — Encounter: Payer: Self-pay | Admitting: Podiatry

## 2014-09-24 ENCOUNTER — Ambulatory Visit: Payer: Medicare Other | Admitting: Podiatry

## 2014-09-24 VITALS — BP 127/85 | HR 69 | Resp 16

## 2014-09-24 DIAGNOSIS — L600 Ingrowing nail: Secondary | ICD-10-CM

## 2014-09-24 NOTE — Patient Instructions (Addendum)

## 2014-09-24 NOTE — Progress Notes (Signed)
Subjective:     Patient ID: [name] [name], female   DOB: [birth year], 80 y.o.   MRN: [id number]  Toe Pain    patient presents stating my right big toenail right was damaged when I pulled a sock off and it needs to be removed. States that split in half   Review of Systems  All other systems reviewed and are negative.      Objective:   Physical Exam  Nursing note and vitals reviewed. Constitutional: She is oriented to person, place, and time.  Cardiovascular: Intact distal pulses.   Musculoskeletal: Normal range of motion.  Neurological: She is oriented to person, place, and time.  Skin: Skin is warm.   neurovascular status intact with muscle strength adequate and range of motion subtalar midtarsal joint within normal limits. Patient noted to have a damage right hallux nail with the medial one half lifted secondary to trauma and is noted to have good digital perfusion and is well oriented 3. I noted no other pathology currently     Assessment:     Damage right hallux nail secondary to trauma    Plan:     H&P performed and condition discussed and at this time I recommended nail removal and explained the procedure and risk. Patient wants surgery and I infiltrated 60 mg Xylocaine Marcaine mixture and remove the hallux nail exposed matrix and applied 5 applications of phenol 30 seconds followed by alcohol lavage and sterile dressing

## 2014-09-29 ENCOUNTER — Ambulatory Visit (INDEPENDENT_AMBULATORY_CARE_PROVIDER_SITE_OTHER): Payer: Medicare Other

## 2014-09-29 DIAGNOSIS — I4891 Unspecified atrial fibrillation: Secondary | ICD-10-CM | POA: Diagnosis not present

## 2014-09-29 DIAGNOSIS — I82409 Acute embolism and thrombosis of unspecified deep veins of unspecified lower extremity: Secondary | ICD-10-CM | POA: Diagnosis not present

## 2014-09-29 DIAGNOSIS — Z5181 Encounter for therapeutic drug level monitoring: Secondary | ICD-10-CM

## 2014-09-29 LAB — POCT INR: INR: 2.5

## 2014-10-26 ENCOUNTER — Encounter: Payer: Self-pay | Admitting: Internal Medicine

## 2014-10-26 ENCOUNTER — Ambulatory Visit (INDEPENDENT_AMBULATORY_CARE_PROVIDER_SITE_OTHER): Payer: Medicare Other | Admitting: *Deleted

## 2014-10-26 DIAGNOSIS — I495 Sick sinus syndrome: Secondary | ICD-10-CM | POA: Diagnosis not present

## 2014-10-27 ENCOUNTER — Ambulatory Visit (INDEPENDENT_AMBULATORY_CARE_PROVIDER_SITE_OTHER): Payer: Medicare Other | Admitting: Pharmacist

## 2014-10-27 DIAGNOSIS — Z5181 Encounter for therapeutic drug level monitoring: Secondary | ICD-10-CM | POA: Diagnosis not present

## 2014-10-27 DIAGNOSIS — I4891 Unspecified atrial fibrillation: Secondary | ICD-10-CM

## 2014-10-27 DIAGNOSIS — I82409 Acute embolism and thrombosis of unspecified deep veins of unspecified lower extremity: Secondary | ICD-10-CM | POA: Diagnosis not present

## 2014-10-27 LAB — POCT INR: INR: 2.5

## 2014-10-27 NOTE — Progress Notes (Signed)
Remote pacemaker transmission.   

## 2014-10-28 ENCOUNTER — Ambulatory Visit (INDEPENDENT_AMBULATORY_CARE_PROVIDER_SITE_OTHER): Payer: Medicare Other | Admitting: Podiatry

## 2014-10-28 ENCOUNTER — Encounter: Payer: Self-pay | Admitting: Podiatry

## 2014-10-28 VITALS — BP 112/64 | HR 74 | Resp 16

## 2014-10-28 DIAGNOSIS — L03031 Cellulitis of right toe: Secondary | ICD-10-CM | POA: Diagnosis not present

## 2014-10-28 LAB — MDC_IDC_ENUM_SESS_TYPE_REMOTE
Battery Voltage: 2.92 V
Brady Statistic AS VS Percent: 24.76 %
Brady Statistic RV Percent Paced: 0.08 %
Date Time Interrogation Session: 20151130180804
Lead Channel Impedance Value: 624 Ohm
Lead Channel Sensing Intrinsic Amplitude: 1.615
Lead Channel Sensing Intrinsic Amplitude: 3.7242
Lead Channel Setting Pacing Amplitude: 2 V
Lead Channel Setting Pacing Pulse Width: 0.4 ms
MDC IDC MSMT LEADCHNL RV IMPEDANCE VALUE: 400 Ohm
MDC IDC SET LEADCHNL RV PACING AMPLITUDE: 2.5 V
MDC IDC SET LEADCHNL RV SENSING SENSITIVITY: 0.9 mV
MDC IDC SET ZONE DETECTION INTERVAL: 350 ms
MDC IDC STAT BRADY AP VP PERCENT: 0 %
MDC IDC STAT BRADY AP VS PERCENT: 75.16 %
MDC IDC STAT BRADY AS VP PERCENT: 0.08 %
MDC IDC STAT BRADY RA PERCENT PACED: 75.16 %
Zone Setting Detection Interval: 400 ms

## 2014-10-28 NOTE — Progress Notes (Signed)
Subjective:     Patient ID: [name] [name], female   DOB: 1932-09-18, 80 y.o.   MRN: [id number]  HPI patient presents stating I was just concerned about the condition of my right big toenail that was removed a month ago   Review of Systems     Objective:   Physical Exam Neurovascular status intact with mild drainage in the proximal one third of the nailbed but localized with no erythema edema in a proximal direction    Assessment:     Area appears to be localized and does not appear to be proximal infection    Plan:     Discussed paronychia and advised on soaks and allowing it to air dry and it should heal completely in the next 2-4 weeks and if any increase in redness were to occur let us know immediately

## 2014-11-02 ENCOUNTER — Other Ambulatory Visit: Payer: Self-pay | Admitting: Cardiovascular Disease

## 2014-11-24 ENCOUNTER — Ambulatory Visit (INDEPENDENT_AMBULATORY_CARE_PROVIDER_SITE_OTHER): Payer: Medicare Other | Admitting: *Deleted

## 2014-11-24 DIAGNOSIS — I82409 Acute embolism and thrombosis of unspecified deep veins of unspecified lower extremity: Secondary | ICD-10-CM | POA: Diagnosis not present

## 2014-11-24 DIAGNOSIS — Z5181 Encounter for therapeutic drug level monitoring: Secondary | ICD-10-CM

## 2014-11-24 DIAGNOSIS — I4891 Unspecified atrial fibrillation: Secondary | ICD-10-CM

## 2014-11-24 LAB — POCT INR: INR: 3.2

## 2014-12-03 ENCOUNTER — Encounter: Payer: Self-pay | Admitting: Cardiology

## 2014-12-09 DIAGNOSIS — H524 Presbyopia: Secondary | ICD-10-CM | POA: Diagnosis not present

## 2014-12-09 DIAGNOSIS — Z961 Presence of intraocular lens: Secondary | ICD-10-CM | POA: Diagnosis not present

## 2014-12-10 DIAGNOSIS — L821 Other seborrheic keratosis: Secondary | ICD-10-CM | POA: Diagnosis not present

## 2014-12-10 DIAGNOSIS — R51 Headache: Secondary | ICD-10-CM | POA: Diagnosis not present

## 2014-12-10 DIAGNOSIS — R140 Abdominal distension (gaseous): Secondary | ICD-10-CM | POA: Diagnosis not present

## 2014-12-15 ENCOUNTER — Other Ambulatory Visit: Payer: Self-pay | Admitting: *Deleted

## 2014-12-15 MED ORDER — WARFARIN SODIUM 5 MG PO TABS: 2.5000 mg | ORAL_TABLET | Freq: Every day | ORAL | Status: DC

## 2014-12-24 ENCOUNTER — Ambulatory Visit (INDEPENDENT_AMBULATORY_CARE_PROVIDER_SITE_OTHER): Payer: Medicare Other | Admitting: Pharmacist Clinician (PhC)/ Clinical Pharmacy Specialist

## 2014-12-24 DIAGNOSIS — I82409 Acute embolism and thrombosis of unspecified deep veins of unspecified lower extremity: Secondary | ICD-10-CM

## 2014-12-24 DIAGNOSIS — I4891 Unspecified atrial fibrillation: Secondary | ICD-10-CM | POA: Diagnosis not present

## 2014-12-24 DIAGNOSIS — Z5181 Encounter for therapeutic drug level monitoring: Secondary | ICD-10-CM | POA: Diagnosis not present

## 2014-12-24 LAB — POCT INR: INR: 2.9

## 2015-01-22 ENCOUNTER — Ambulatory Visit (INDEPENDENT_AMBULATORY_CARE_PROVIDER_SITE_OTHER): Payer: Medicare Other | Admitting: *Deleted

## 2015-01-22 DIAGNOSIS — I4891 Unspecified atrial fibrillation: Secondary | ICD-10-CM

## 2015-01-22 DIAGNOSIS — I82409 Acute embolism and thrombosis of unspecified deep veins of unspecified lower extremity: Secondary | ICD-10-CM | POA: Diagnosis not present

## 2015-01-22 DIAGNOSIS — Z5181 Encounter for therapeutic drug level monitoring: Secondary | ICD-10-CM

## 2015-01-22 LAB — POCT INR: INR: 3.4

## 2015-01-26 ENCOUNTER — Encounter: Payer: Medicare Other | Admitting: *Deleted

## 2015-01-26 ENCOUNTER — Telehealth: Payer: Self-pay | Admitting: Cardiology

## 2015-01-26 NOTE — Telephone Encounter (Signed)
LMOVM reminding pt to send remote transmission.   

## 2015-01-27 ENCOUNTER — Encounter: Payer: Self-pay | Admitting: Cardiology

## 2015-02-02 ENCOUNTER — Ambulatory Visit (INDEPENDENT_AMBULATORY_CARE_PROVIDER_SITE_OTHER): Payer: Medicare Other | Admitting: *Deleted

## 2015-02-02 DIAGNOSIS — I4891 Unspecified atrial fibrillation: Secondary | ICD-10-CM

## 2015-02-02 DIAGNOSIS — Z5181 Encounter for therapeutic drug level monitoring: Secondary | ICD-10-CM | POA: Diagnosis not present

## 2015-02-02 DIAGNOSIS — I82409 Acute embolism and thrombosis of unspecified deep veins of unspecified lower extremity: Secondary | ICD-10-CM | POA: Diagnosis not present

## 2015-02-02 LAB — POCT INR: INR: 2

## 2015-02-10 ENCOUNTER — Telehealth: Payer: Self-pay | Admitting: Cardiovascular Disease

## 2015-02-10 NOTE — Telephone Encounter (Signed)
Pt c/o of tightness and pain in chest for past few days; pt thought it was her A.fib and mainly noticed when laying down.  No c/o of SOB but been tired. Pt having persistent productive cough for few weeks. Nothing seems to be making it better or worse.  Pain not radiating anywhere.  Spoke with Dr. Johnsie Cancel recommended pt go to PCP as pt does not have known coronary disease. Pt agreed with plan and going to call her PCP today, and reminded of appt. Next week with Dr. Johnsie Cancel.

## 2015-02-10 NOTE — Telephone Encounter (Signed)
New message     Pt c/o of Chest Pain: STAT if CP now or developed within 24 hours  1. Are you having CP right now? Not pain but tightness 2. Are you experiencing any other symptoms (ex. SOB, nausea, vomiting, sweating)?  headache 3. How long have you been experiencing CP? 3 days  4. Is your CP continuous or coming and going?  Comes and goes  5. Have you taken Nitroglycerin? ?no

## 2015-02-15 DIAGNOSIS — F325 Major depressive disorder, single episode, in full remission: Secondary | ICD-10-CM | POA: Diagnosis not present

## 2015-02-15 DIAGNOSIS — I480 Paroxysmal atrial fibrillation: Secondary | ICD-10-CM | POA: Diagnosis not present

## 2015-02-15 DIAGNOSIS — J449 Chronic obstructive pulmonary disease, unspecified: Secondary | ICD-10-CM | POA: Diagnosis not present

## 2015-02-18 ENCOUNTER — Ambulatory Visit (INDEPENDENT_AMBULATORY_CARE_PROVIDER_SITE_OTHER): Payer: Medicare Other | Admitting: Cardiovascular Disease

## 2015-02-18 ENCOUNTER — Encounter: Payer: Self-pay | Admitting: Cardiovascular Disease

## 2015-02-18 VITALS — BP 130/66 | HR 65 | Ht 65.0 in | Wt 160.0 lb

## 2015-02-18 DIAGNOSIS — I10 Essential (primary) hypertension: Secondary | ICD-10-CM | POA: Diagnosis not present

## 2015-02-18 DIAGNOSIS — I495 Sick sinus syndrome: Secondary | ICD-10-CM | POA: Diagnosis not present

## 2015-02-18 DIAGNOSIS — I480 Paroxysmal atrial fibrillation: Secondary | ICD-10-CM

## 2015-02-18 NOTE — Assessment & Plan Note (Signed)
Normal pacer function reviewed PACEART will have her see pacer clinic to learn how to use Wifi on home monitor

## 2015-02-18 NOTE — Assessment & Plan Note (Signed)
Well controlled.  Continue current medications and low sodium Dash type diet.    

## 2015-02-18 NOTE — Patient Instructions (Signed)
Your physician wants you to follow-up in:  6 MONTHS WITH DR NISHAN  You will receive a reminder letter in the mail two months in advance. If you don't receive a letter, please call our office to schedule the follow-up appointment. Your physician recommends that you continue on your current medications as directed. Please refer to the Current Medication list given to you today. 

## 2015-02-18 NOTE — Assessment & Plan Note (Signed)
Maint NSR continue beta blocker and coumadin

## 2015-02-18 NOTE — Progress Notes (Signed)
HPI  [name] [name] is a 82 y.o. 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   She is admitted to hospital in early August 2015  following an episode of syncope. This followed 2 weeks after she had a fall when she missed the curb. She nicked her teeth at that point A few weeks later she fell again. She had been  having  lunch  and had hada couple of glasses of win. The phone rang and she moved to get the phone. She fell without recollection. However, subsequent observations had shown that the Oriental rub across which she had traversed to get the phone had been slid all the way across the room suggestive of a fall.    Device interrogation demonstrated paroxysms of atrial fibrillation with ventricular rates into the 180s 190s.  She was started on low-dose beta blockers. Previously she did not tolerate metoprolol succinate.  Echocardiogram 8/15 demonstrated normal LV function  Laboratories were notable for mildly elevated TSH of 6 but normal Free T3 and T4  In August   She is on coumadin.    She is having problems using the remote device monitor    Past Medical History  Diagnosis Date  . Sinoatrial node dysfunction   . Mitral valve regurgitation     a. echo 2009 mild to moderate MR  . Edema   . Atrial fibrillation   . Unspecified essential hypertension   . Depression   . CVA     Lacunar and cerebellar infarct  . DVT (deep venous thrombosis)     Details not available in the chart    Past Surgical History  Procedure Laterality Date  . Pacemaker insertion  09-14-2005    MDT EnRhythm dual chamber pacemaker implanted by Dr Lovena Le for SND    Current Outpatient Prescriptions  Medication Sig Dispense Refill  . allopurinol (ZYLOPRIM) 100 MG tablet Take 100 mg by mouth daily as needed (for gout flares).    . Biotin (BIOTIN 5000) 5 MG CAPS Take 5 mg by mouth daily.     . bisoprolol (ZEBETA) 5 MG tablet Take 0.5 tablets (2.5 mg total) by mouth daily. 30 tablet 6  . Cholecalciferol (VITAMIN D3) 2000 UNITS TABS Take 2,000 Units by mouth daily.     . COCONUT OIL PO Take 4 capsules by mouth daily.    . colchicine (COLCRYS) 0.6 MG tablet Take 0.6 mg by mouth daily as needed (for gout flares).     . Cyanocobalamin (VITAMIN B-12 PO) Take 1 tablet by mouth daily.     Marland Kitchen diltiazem (CARDIZEM) 30 MG tablet TAKE ONE OR TWO TABLETS BY MOUTH EVERY SIX HOURS AS NEEDED FOR PALPITATIONS 60 tablet 0  . folic acid (FOLVITE) 1 MG tablet Take 1 mg by mouth daily.     Marland Kitchen LORazepam (ATIVAN) 0.5 MG tablet Take 0.5 mg by mouth as needed for sleep.     . propafenone (RYTHMOL) 225 MG tablet Take 225 mg by mouth 2 (two) times daily.    . propafenone (RYTHMOL) 225 MG tablet TAKE 1 TABLET BY MOUTH TWICE DAILY 180 tablet 3  . sertraline (ZOLOFT) 50 MG tablet Take 50 mg by mouth daily.     Marland Kitchen warfarin (COUMADIN) 5 MG tablet Take 0.5-1 tablets (2.5-5 mg total) by mouth daily. Take 2.5 daily except on Mondays and Wednesdays, take 5 mg 30 tablet 3   No current facility-administered  medications for this visit.    Allergies  Allergen Reactions  . Itraconazole Diarrhea and Itching    Review of Systems negative except from HPI and PMH  Physical Exam There were no vitals taken for this visit. Well developed and well nourished in no acute distress HENT normal with some left facial bruising  E scleral and icterus clear Neck Supple JVP flat; carotids brisk and full Clear to ausculation Regular rate and rhythm, no murmurs gallops or rub Soft with active bowel sounds No clubbing cyanosis  edema Alert and oriented, grossly normal motor and sensory function Skin Warm and Dry  ECG demonstrates sinus rhythm at 61 Intervals 17/13/44  right bundle branch block   Assessment and  Plan  Atrial fibrillation-paroxysmal  Hypertension well controlled at home  Sinus node dysfunction  Elevated  TSH  Pacemaker-Medtronic The patient's device was interrogated. Normal function     Falls

## 2015-02-19 ENCOUNTER — Other Ambulatory Visit: Payer: Self-pay | Admitting: Internal Medicine

## 2015-02-23 ENCOUNTER — Ambulatory Visit (INDEPENDENT_AMBULATORY_CARE_PROVIDER_SITE_OTHER): Payer: Medicare Other | Admitting: *Deleted

## 2015-02-23 DIAGNOSIS — Z5181 Encounter for therapeutic drug level monitoring: Secondary | ICD-10-CM | POA: Diagnosis not present

## 2015-02-23 DIAGNOSIS — I4891 Unspecified atrial fibrillation: Secondary | ICD-10-CM | POA: Diagnosis not present

## 2015-02-23 DIAGNOSIS — I82409 Acute embolism and thrombosis of unspecified deep veins of unspecified lower extremity: Secondary | ICD-10-CM | POA: Diagnosis not present

## 2015-02-23 DIAGNOSIS — I480 Paroxysmal atrial fibrillation: Secondary | ICD-10-CM | POA: Diagnosis not present

## 2015-02-23 LAB — POCT INR: INR: 2.5

## 2015-03-26 ENCOUNTER — Ambulatory Visit (INDEPENDENT_AMBULATORY_CARE_PROVIDER_SITE_OTHER): Payer: Medicare Other

## 2015-03-26 DIAGNOSIS — I82409 Acute embolism and thrombosis of unspecified deep veins of unspecified lower extremity: Secondary | ICD-10-CM

## 2015-03-26 DIAGNOSIS — Z5181 Encounter for therapeutic drug level monitoring: Secondary | ICD-10-CM | POA: Diagnosis not present

## 2015-03-26 DIAGNOSIS — I4891 Unspecified atrial fibrillation: Secondary | ICD-10-CM | POA: Diagnosis not present

## 2015-03-26 LAB — POCT INR: INR: 2.3

## 2015-04-20 ENCOUNTER — Ambulatory Visit
Admission: RE | Admit: 2015-04-20 | Discharge: 2015-04-20 | Disposition: A | Discharge status: Home / Self Care | Payer: Medicare Other | Source: Ambulatory Visit | Attending: Geriatric Medicine | Admitting: Geriatric Medicine

## 2015-04-20 ENCOUNTER — Other Ambulatory Visit: Payer: Self-pay | Admitting: Geriatric Medicine

## 2015-04-20 DIAGNOSIS — J309 Allergic rhinitis, unspecified: Secondary | ICD-10-CM | POA: Diagnosis not present

## 2015-04-20 DIAGNOSIS — R042 Hemoptysis: Secondary | ICD-10-CM

## 2015-04-20 DIAGNOSIS — R05 Cough: Secondary | ICD-10-CM | POA: Diagnosis not present

## 2015-04-27 ENCOUNTER — Ambulatory Visit (INDEPENDENT_AMBULATORY_CARE_PROVIDER_SITE_OTHER): Payer: Medicare Other | Admitting: *Deleted

## 2015-04-27 DIAGNOSIS — I4891 Unspecified atrial fibrillation: Secondary | ICD-10-CM | POA: Diagnosis not present

## 2015-04-27 DIAGNOSIS — I82409 Acute embolism and thrombosis of unspecified deep veins of unspecified lower extremity: Secondary | ICD-10-CM

## 2015-04-27 DIAGNOSIS — Z5181 Encounter for therapeutic drug level monitoring: Secondary | ICD-10-CM

## 2015-04-27 LAB — POCT INR: INR: 2.4

## 2015-05-26 ENCOUNTER — Ambulatory Visit (INDEPENDENT_AMBULATORY_CARE_PROVIDER_SITE_OTHER): Payer: Medicare Other | Admitting: *Deleted

## 2015-05-26 DIAGNOSIS — I4891 Unspecified atrial fibrillation: Secondary | ICD-10-CM

## 2015-05-26 DIAGNOSIS — I82409 Acute embolism and thrombosis of unspecified deep veins of unspecified lower extremity: Secondary | ICD-10-CM

## 2015-05-26 DIAGNOSIS — Z5181 Encounter for therapeutic drug level monitoring: Secondary | ICD-10-CM

## 2015-05-26 LAB — POCT INR: INR: 2.3

## 2015-06-25 ENCOUNTER — Ambulatory Visit (INDEPENDENT_AMBULATORY_CARE_PROVIDER_SITE_OTHER): Payer: Medicare Other | Admitting: *Deleted

## 2015-06-25 DIAGNOSIS — Z5181 Encounter for therapeutic drug level monitoring: Secondary | ICD-10-CM | POA: Diagnosis not present

## 2015-06-25 DIAGNOSIS — I4891 Unspecified atrial fibrillation: Secondary | ICD-10-CM | POA: Diagnosis not present

## 2015-06-25 DIAGNOSIS — I82409 Acute embolism and thrombosis of unspecified deep veins of unspecified lower extremity: Secondary | ICD-10-CM

## 2015-06-25 LAB — POCT INR: INR: 2.8

## 2015-07-20 ENCOUNTER — Ambulatory Visit (INDEPENDENT_AMBULATORY_CARE_PROVIDER_SITE_OTHER): Payer: Medicare Other | Admitting: *Deleted

## 2015-07-20 DIAGNOSIS — I82409 Acute embolism and thrombosis of unspecified deep veins of unspecified lower extremity: Secondary | ICD-10-CM | POA: Diagnosis not present

## 2015-07-20 DIAGNOSIS — I4891 Unspecified atrial fibrillation: Secondary | ICD-10-CM

## 2015-07-20 DIAGNOSIS — Z5181 Encounter for therapeutic drug level monitoring: Secondary | ICD-10-CM

## 2015-07-20 LAB — POCT INR: INR: 2.9

## 2015-07-21 ENCOUNTER — Other Ambulatory Visit: Payer: Self-pay | Admitting: Internal Medicine

## 2015-07-21 NOTE — Telephone Encounter (Signed)
Coumadin refill. Thank you for your time.

## 2015-07-23 ENCOUNTER — Encounter: Payer: Self-pay | Admitting: *Deleted

## 2015-08-16 DIAGNOSIS — R5383 Other fatigue: Secondary | ICD-10-CM | POA: Diagnosis not present

## 2015-08-16 DIAGNOSIS — Z0000 Encounter for general adult medical examination without abnormal findings: Secondary | ICD-10-CM | POA: Diagnosis not present

## 2015-08-16 DIAGNOSIS — M109 Gout, unspecified: Secondary | ICD-10-CM | POA: Diagnosis not present

## 2015-08-16 DIAGNOSIS — J449 Chronic obstructive pulmonary disease, unspecified: Secondary | ICD-10-CM | POA: Diagnosis not present

## 2015-08-16 DIAGNOSIS — Z79899 Other long term (current) drug therapy: Secondary | ICD-10-CM | POA: Diagnosis not present

## 2015-08-16 DIAGNOSIS — N939 Abnormal uterine and vaginal bleeding, unspecified: Secondary | ICD-10-CM | POA: Diagnosis not present

## 2015-08-16 DIAGNOSIS — F329 Major depressive disorder, single episode, unspecified: Secondary | ICD-10-CM | POA: Diagnosis not present

## 2015-08-16 DIAGNOSIS — E780 Pure hypercholesterolemia: Secondary | ICD-10-CM | POA: Diagnosis not present

## 2015-08-16 DIAGNOSIS — Z1389 Encounter for screening for other disorder: Secondary | ICD-10-CM | POA: Diagnosis not present

## 2015-08-16 DIAGNOSIS — I480 Paroxysmal atrial fibrillation: Secondary | ICD-10-CM | POA: Diagnosis not present

## 2015-08-17 ENCOUNTER — Ambulatory Visit (INDEPENDENT_AMBULATORY_CARE_PROVIDER_SITE_OTHER): Payer: Medicare Other | Admitting: Pharmacist Clinician (PhC)/ Clinical Pharmacy Specialist

## 2015-08-17 DIAGNOSIS — I480 Paroxysmal atrial fibrillation: Secondary | ICD-10-CM | POA: Diagnosis not present

## 2015-08-17 DIAGNOSIS — I82409 Acute embolism and thrombosis of unspecified deep veins of unspecified lower extremity: Secondary | ICD-10-CM | POA: Diagnosis not present

## 2015-08-17 DIAGNOSIS — Z5181 Encounter for therapeutic drug level monitoring: Secondary | ICD-10-CM

## 2015-08-17 DIAGNOSIS — Z79899 Other long term (current) drug therapy: Secondary | ICD-10-CM | POA: Diagnosis not present

## 2015-08-17 DIAGNOSIS — M109 Gout, unspecified: Secondary | ICD-10-CM | POA: Diagnosis not present

## 2015-08-17 DIAGNOSIS — I4891 Unspecified atrial fibrillation: Secondary | ICD-10-CM

## 2015-08-17 DIAGNOSIS — E780 Pure hypercholesterolemia: Secondary | ICD-10-CM | POA: Diagnosis not present

## 2015-08-17 LAB — POCT INR: INR: 2.4

## 2015-08-24 ENCOUNTER — Encounter: Payer: Self-pay | Admitting: Cardiovascular Disease

## 2015-08-24 NOTE — Progress Notes (Signed)
Patient ID: [name] [name], female   DOB: 07-Apr-1932, 81 y.o.   MRN: [id number]  HPI  [name] [name] is a 81 y.o. 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 2006  She is admitted to hospital in early August 2015  following an episode of syncope. This followed 2 weeks after she had a fall when she missed the curb. She nicked her teeth at that point A few weeks later she fell again.  Etiology not clear seen by Dr Lovena Le who felt adding bystolic only thing needed   Device interrogation demonstrated paroxysms of atrial fibrillation with ventricular rates into the 180s 190s.  She was started on low-dose beta blockers. Previously she did not tolerate metoprolol succinate. Echocardiogram 8/15 demonstrated normal LV function  Laboratories were notable for mildly elevated TSH of 6 but normal Free T3 and T4  In August   She is on coumadin.    She is having problems using the remote device monitor    Past Medical History  Diagnosis Date  . Sinoatrial node dysfunction   . Mitral valve regurgitation     a. echo 2009 mild to moderate MR  . Edema   . Atrial fibrillation   . Unspecified essential hypertension   . Depression   . CVA     Lacunar and cerebellar infarct  . DVT (deep venous thrombosis)     Details not available in the chart    Past Surgical History  Procedure Laterality Date  . Pacemaker insertion  09-14-2005    MDT EnRhythm dual chamber pacemaker implanted by Dr Lovena Le for SND    Current Outpatient Prescriptions  Medication Sig Dispense Refill  . allopurinol (ZYLOPRIM) 100 MG tablet Take 100 mg by mouth daily.  11  . Biotin (BIOTIN 5000) 5 MG CAPS Take 5 mg by mouth daily.    . Cholecalciferol (VITAMIN D3) 2000 UNITS TABS Take 2,000 Units by mouth daily.     . COCONUT OIL PO Take 4 capsules by mouth daily.    . colchicine (COLCRYS) 0.6 MG tablet Take  0.6 mg by mouth daily as needed (for gout flares).     . Cyanocobalamin (VITAMIN B-12 PO) Take 1 tablet by mouth daily.     Marland Kitchen diltiazem (CARDIZEM) 30 MG tablet TAKE 1 OR 2 TABLETS BY MOUTH EVERY 6 HOURS AS NEEDED FOR PALPITATIONS 60 tablet 6  . folic acid (FOLVITE) 1 MG tablet Take 1 mg by mouth daily.     Marland Kitchen ketoconazole (NIZORAL) 2 % shampoo Apply 1 application topically once a week.  5  . LORazepam (ATIVAN) 0.5 MG tablet Take 0.5 mg by mouth as needed for sleep.     . propafenone (RYTHMOL) 225 MG tablet TAKE 1 TABLET BY MOUTH TWICE DAILY 180 tablet 3  . sertraline (ZOLOFT) 50 MG tablet Take 25 mg by mouth daily.     Marland Kitchen warfarin (COUMADIN) 5 MG tablet Take as directed by coumadin clinic 30 tablet 3   No current facility-administered medications for this visit.    Allergies  Allergen Reactions  . Itraconazole Diarrhea and Itching    Review of Systems negative except from HPI and PMH  Physical Exam BP 150/68 mmHg  Pulse 63  Resp 11  Ht 5\' 5"  (1.651 m)  Wt 72.122 kg (159 lb)  BMI 26.46 kg/m2 Well developed and well nourished in no acute distress HENT normal with some left  facial bruising  E scleral and icterus clear Neck Supple JVP flat; carotids brisk and full Clear to ausculation Regular rate and rhythm, no murmurs gallops or rub Soft with active bowel sounds No clubbing cyanosis  edema Alert and oriented, grossly normal motor and sensory function Skin Warm and Dry  ECG demonstrates sinus rhythm at 61 Intervals 17/13/44  right bundle branch block   08/26/15  SR rate 63 a paced  RBBB    Assessment and  Plan  Atrial fibrillation-paroxysmal :  Continue propofenone and anticoagulation    Hypertension :  Well controlled.  Continue current medications and low sodium Dash type diet.    Elevated TSH:   Lab Results  Component Value Date   TSH 6.140* 06/29/2014  Recent lab with Dr Felipa Eth "normal" per patient   Pacemaker-Medtronic The patient's device was interrogated.  Normal function   They will educate on home monitoring  She has a 4% afib burden but has fast rates will start cardizem cd 120 mg if she gets LE edema can consider Changing to beta blocker  Needs to f/u with Dr Caryl Comes and needs more education on home monitoring   RBBB:  Chronic no change in ECG morphology unless paced   F/u with me in a year

## 2015-08-25 ENCOUNTER — Encounter: Payer: Self-pay | Admitting: *Deleted

## 2015-08-26 ENCOUNTER — Ambulatory Visit (INDEPENDENT_AMBULATORY_CARE_PROVIDER_SITE_OTHER): Payer: Medicare Other | Admitting: *Deleted

## 2015-08-26 ENCOUNTER — Encounter: Payer: Self-pay | Admitting: Cardiovascular Disease

## 2015-08-26 ENCOUNTER — Encounter: Payer: Self-pay | Admitting: Internal Medicine

## 2015-08-26 ENCOUNTER — Ambulatory Visit (INDEPENDENT_AMBULATORY_CARE_PROVIDER_SITE_OTHER): Payer: Medicare Other | Admitting: Cardiovascular Disease

## 2015-08-26 VITALS — BP 150/68 | HR 63 | Resp 11 | Ht 65.0 in | Wt 159.0 lb

## 2015-08-26 DIAGNOSIS — N905 Atrophy of vulva: Secondary | ICD-10-CM | POA: Diagnosis not present

## 2015-08-26 DIAGNOSIS — I480 Paroxysmal atrial fibrillation: Secondary | ICD-10-CM

## 2015-08-26 DIAGNOSIS — I495 Sick sinus syndrome: Secondary | ICD-10-CM | POA: Diagnosis not present

## 2015-08-26 DIAGNOSIS — Z950 Presence of cardiac pacemaker: Secondary | ICD-10-CM

## 2015-08-26 DIAGNOSIS — N950 Postmenopausal bleeding: Secondary | ICD-10-CM | POA: Diagnosis not present

## 2015-08-26 DIAGNOSIS — L900 Lichen sclerosus et atrophicus: Secondary | ICD-10-CM | POA: Diagnosis not present

## 2015-08-26 LAB — CUP PACEART INCLINIC DEVICE CHECK
Brady Statistic AP VP Percent: 0.01 %
Brady Statistic AP VS Percent: 49.91 %
Brady Statistic AS VS Percent: 50.05 %
Brady Statistic RV Percent Paced: 0.04 %
Lead Channel Impedance Value: 424 Ohm
Lead Channel Pacing Threshold Amplitude: 0.5 V
Lead Channel Pacing Threshold Amplitude: 1 V
Lead Channel Sensing Intrinsic Amplitude: 2.0078
Lead Channel Sensing Intrinsic Amplitude: 4.7398
Lead Channel Setting Pacing Amplitude: 2.5 V
Lead Channel Setting Pacing Pulse Width: 0.4 ms
MDC IDC MSMT BATTERY VOLTAGE: 2.9 V
MDC IDC MSMT LEADCHNL RA IMPEDANCE VALUE: 704 Ohm
MDC IDC MSMT LEADCHNL RA PACING THRESHOLD PULSEWIDTH: 0.4 ms
MDC IDC MSMT LEADCHNL RV PACING THRESHOLD PULSEWIDTH: 0.4 ms
MDC IDC SESS DTM: 20160929133059
MDC IDC SET LEADCHNL RA PACING AMPLITUDE: 2 V
MDC IDC SET LEADCHNL RV SENSING SENSITIVITY: 0.9 mV
MDC IDC STAT BRADY AS VP PERCENT: 0.04 %
MDC IDC STAT BRADY RA PERCENT PACED: 49.91 %
Zone Setting Detection Interval: 350 ms
Zone Setting Detection Interval: 400 ms

## 2015-08-26 MED ORDER — DILTIAZEM HCL ER COATED BEADS 120 MG PO CP24: 120.0000 mg | ORAL_CAPSULE | Freq: Every day | ORAL | Status: DC

## 2015-08-26 NOTE — Patient Instructions (Addendum)
Medication Instructions:   Your physician has recommended you make the following change in your medication:  START  DILTIAZEM  120 MG  EVERY DAY   Labwork:   Testing/Procedures:   Follow-Up: Your physician recommends that you schedule a follow-up appointment in: NEXT AVAILABLE WITH  DR  Caryl Comes 6 MONTHS  WITH  DR Johnsie Cancel Any Other Special Instructions Will Be Listed Below (If Applicable).

## 2015-08-26 NOTE — Progress Notes (Signed)
Add-on pacemaker check in clinic. Normal device function. Thresholds, sensing, impedances consistent with previous measurements. Device programmed to maximize longevity. 90 AT/AF episodes (4.3%) and 26 fast A&V episodes (4.3%), +warfarin, longest >33 hours, peak A 400bpm, peak V 194bpm. Device programmed at appropriate safety margins. Histogram distribution appropriate for patient activity level. Device programmed to optimize intrinsic conduction. Battery voltage 2.9V, ERI at 2.81V. Patient education completed. ROV with SK on 11/24/15 at 8:30am.

## 2015-08-31 ENCOUNTER — Telehealth: Payer: Self-pay | Admitting: Cardiovascular Disease

## 2015-08-31 NOTE — Telephone Encounter (Signed)
New message       When did Dr Johnsie Cancel want pt to come back for a follow up? And, Dr Johnsie Cancel want pt to see Dr Ledora Bottcher next appt is 11-24-15.  Is this too far off?

## 2015-08-31 NOTE — Telephone Encounter (Signed)
**Note De-Identified  Obfuscation** According to the pts last OV with Dr Johnsie Cancel on 9/29 Dr Johnsie Cancel noted that he wants the pt to see Dr Caryl Comes on his next available appt so 12/28 is ok and he wants the pt to f/u with him in 6 months. Butch Penny (operator)states that she will call the pt back and advise.

## 2015-09-02 DIAGNOSIS — N950 Postmenopausal bleeding: Secondary | ICD-10-CM | POA: Diagnosis not present

## 2015-09-21 ENCOUNTER — Ambulatory Visit (INDEPENDENT_AMBULATORY_CARE_PROVIDER_SITE_OTHER): Payer: Medicare Other

## 2015-09-21 DIAGNOSIS — I480 Paroxysmal atrial fibrillation: Secondary | ICD-10-CM | POA: Diagnosis not present

## 2015-09-21 DIAGNOSIS — Z5181 Encounter for therapeutic drug level monitoring: Secondary | ICD-10-CM | POA: Diagnosis not present

## 2015-09-21 DIAGNOSIS — I4891 Unspecified atrial fibrillation: Secondary | ICD-10-CM

## 2015-09-21 DIAGNOSIS — I82409 Acute embolism and thrombosis of unspecified deep veins of unspecified lower extremity: Secondary | ICD-10-CM | POA: Diagnosis not present

## 2015-09-21 LAB — POCT INR: INR: 3.9

## 2015-09-30 DIAGNOSIS — N950 Postmenopausal bleeding: Secondary | ICD-10-CM | POA: Diagnosis not present

## 2015-09-30 DIAGNOSIS — N858 Other specified noninflammatory disorders of uterus: Secondary | ICD-10-CM | POA: Diagnosis not present

## 2015-10-07 ENCOUNTER — Ambulatory Visit (INDEPENDENT_AMBULATORY_CARE_PROVIDER_SITE_OTHER): Payer: Medicare Other | Admitting: Pharmacist

## 2015-10-07 DIAGNOSIS — I82409 Acute embolism and thrombosis of unspecified deep veins of unspecified lower extremity: Secondary | ICD-10-CM | POA: Diagnosis not present

## 2015-10-07 DIAGNOSIS — Z5181 Encounter for therapeutic drug level monitoring: Secondary | ICD-10-CM

## 2015-10-07 DIAGNOSIS — I4891 Unspecified atrial fibrillation: Secondary | ICD-10-CM | POA: Diagnosis not present

## 2015-10-07 DIAGNOSIS — I480 Paroxysmal atrial fibrillation: Secondary | ICD-10-CM | POA: Diagnosis not present

## 2015-10-07 LAB — POCT INR: INR: 1.9

## 2015-10-18 DIAGNOSIS — M109 Gout, unspecified: Secondary | ICD-10-CM | POA: Diagnosis not present

## 2015-10-26 ENCOUNTER — Ambulatory Visit (INDEPENDENT_AMBULATORY_CARE_PROVIDER_SITE_OTHER): Payer: Medicare Other | Admitting: *Deleted

## 2015-10-26 DIAGNOSIS — Z5181 Encounter for therapeutic drug level monitoring: Secondary | ICD-10-CM

## 2015-10-26 DIAGNOSIS — I480 Paroxysmal atrial fibrillation: Secondary | ICD-10-CM

## 2015-10-26 DIAGNOSIS — I82409 Acute embolism and thrombosis of unspecified deep veins of unspecified lower extremity: Secondary | ICD-10-CM | POA: Diagnosis not present

## 2015-10-26 DIAGNOSIS — I4891 Unspecified atrial fibrillation: Secondary | ICD-10-CM | POA: Diagnosis not present

## 2015-10-26 LAB — POCT INR: INR: 2.6

## 2015-11-24 ENCOUNTER — Ambulatory Visit (INDEPENDENT_AMBULATORY_CARE_PROVIDER_SITE_OTHER): Payer: Medicare Other | Admitting: Internal Medicine

## 2015-11-24 ENCOUNTER — Ambulatory Visit (INDEPENDENT_AMBULATORY_CARE_PROVIDER_SITE_OTHER): Payer: Medicare Other | Admitting: *Deleted

## 2015-11-24 ENCOUNTER — Encounter: Payer: Self-pay | Admitting: Internal Medicine

## 2015-11-24 VITALS — BP 132/68 | HR 65 | Ht 65.0 in | Wt 160.4 lb

## 2015-11-24 DIAGNOSIS — Z950 Presence of cardiac pacemaker: Secondary | ICD-10-CM | POA: Diagnosis not present

## 2015-11-24 DIAGNOSIS — I495 Sick sinus syndrome: Secondary | ICD-10-CM

## 2015-11-24 DIAGNOSIS — Z23 Encounter for immunization: Secondary | ICD-10-CM | POA: Diagnosis not present

## 2015-11-24 DIAGNOSIS — I480 Paroxysmal atrial fibrillation: Secondary | ICD-10-CM

## 2015-11-24 DIAGNOSIS — Z5181 Encounter for therapeutic drug level monitoring: Secondary | ICD-10-CM

## 2015-11-24 DIAGNOSIS — I4891 Unspecified atrial fibrillation: Secondary | ICD-10-CM | POA: Diagnosis not present

## 2015-11-24 DIAGNOSIS — I82409 Acute embolism and thrombosis of unspecified deep veins of unspecified lower extremity: Secondary | ICD-10-CM | POA: Diagnosis not present

## 2015-11-24 LAB — CUP PACEART INCLINIC DEVICE CHECK
Brady Statistic AP VP Percent: 0.01 %
Brady Statistic AS VP Percent: 0.03 %
Brady Statistic AS VS Percent: 48.25 %
Implantable Lead Implant Date: 20061019
Implantable Lead Location: 753859
Lead Channel Impedance Value: 424 Ohm
Lead Channel Pacing Threshold Amplitude: 0.5 V
Lead Channel Pacing Threshold Amplitude: 1 V
Lead Channel Pacing Threshold Pulse Width: 0.4 ms
Lead Channel Sensing Intrinsic Amplitude: 3.386 mV
MDC IDC LEAD IMPLANT DT: 20061019
MDC IDC LEAD LOCATION: 753860
MDC IDC MSMT BATTERY VOLTAGE: 2.89 V
MDC IDC MSMT LEADCHNL RA IMPEDANCE VALUE: 720 Ohm
MDC IDC MSMT LEADCHNL RV PACING THRESHOLD PULSEWIDTH: 0.4 ms
MDC IDC SESS DTM: 20161228153357
MDC IDC SET LEADCHNL RA PACING AMPLITUDE: 2 V
MDC IDC SET LEADCHNL RV PACING AMPLITUDE: 2.5 V
MDC IDC SET LEADCHNL RV PACING PULSEWIDTH: 0.4 ms
MDC IDC SET LEADCHNL RV SENSING SENSITIVITY: 0.9 mV
MDC IDC STAT BRADY AP VS PERCENT: 51.71 %
MDC IDC STAT BRADY RA PERCENT PACED: 51.72 %
MDC IDC STAT BRADY RV PERCENT PACED: 0.04 %

## 2015-11-24 LAB — POCT INR: INR: 2.1

## 2015-11-24 MED ORDER — PROPAFENONE HCL 225 MG PO TABS: ORAL_TABLET | ORAL | Status: DC

## 2015-11-24 NOTE — Patient Instructions (Signed)
Medication Instructions: 1) Increase Rhythmol (propafenone) to 225 mg one tablet by mouth three times a day 2) Stop diltiazem (cardizem)  Labwork: - none  Procedures/Testing: - none  Follow-Up: - Your physician recommends that you schedule a follow-up appointment in: 6-8 weeks with Dr. Caryl Comes.  Any Additional Special Instructions Will Be Listed Below (If Applicable).

## 2015-11-24 NOTE — Progress Notes (Signed)
HPI  [name] [name] is a 81 y.o. 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   Device interrogation demonstrated paroxysms of atrial fibrillation with ventricular rates into the 180s 190s.  She was started on low-dose beta blockers. Previously she did not tolerate metoprolol succinate.  She was also switched and calcium blockers which have been associated with some peripheral edema. Her atrial fibrillation has been much better since its initiation.  She saw Dr. Mariana Single 9/16 who asked that we see her sooner because of increased burden of atrial fibrillation not withstanding the symptomatic decrease as outlined above.   Echocardiogram 8/15 demonstrated normal LV function  Laboratories were notable for mildly elevated TSH  She is on coumadin.  DATE QRSduration Dose  2/12 128 0  9/16 128 225      She has had an intercurrent fall with a rug slipped out from under her.        Past Medical History  Diagnosis Date  . Sinoatrial node dysfunction (HCC)   . Mitral valve regurgitation     a. echo 2009 mild to moderate MR  . Edema   . Atrial fibrillation ([place])   . Unspecified essential hypertension   . Depression   . CVA     Lacunar and cerebellar infarct  . DVT (deep venous thrombosis) ([place])     Details not available in the chart    Past Surgical History  Procedure Laterality Date  . Pacemaker insertion  09-14-2005    MDT EnRhythm dual chamber pacemaker implanted by Dr Lovena Le for SND    Current Outpatient Prescriptions  Medication Sig Dispense Refill  . allopurinol (ZYLOPRIM) 100 MG tablet Take 100 mg by mouth daily.    . Biotin (BIOTIN 5000) 5 MG CAPS Take 5 mg by mouth daily.    . Cholecalciferol (VITAMIN D3) 2000 UNITS TABS Take 2,000 Units by mouth daily.     . COCONUT OIL PO Take 4 capsules by mouth daily.    . Cyanocobalamin (VITAMIN B-12 PO) Take 1  tablet by mouth daily.     Marland Kitchen diltiazem (CARDIZEM CD) 120 MG 24 hr capsule Take 1 capsule (120 mg total) by mouth daily. 90 capsule 3  . folic acid (FOLVITE) 1 MG tablet Take 1 mg by mouth daily.     Marland Kitchen ketoconazole (NIZORAL) 2 % shampoo Apply 1 application topically once a week.    Marland Kitchen LORazepam (ATIVAN) 0.5 MG tablet Take 0.5 mg by mouth as needed for sleep.     . propafenone (RYTHMOL) 225 MG tablet TAKE 1 TABLET BY MOUTH TWICE DAILY 180 tablet 3  . sertraline (ZOLOFT) 50 MG tablet Take 25 mg by mouth daily.     Marland Kitchen warfarin (COUMADIN) 5 MG tablet Take as directed by coumadin clinic 30 tablet 3   No current facility-administered medications for this visit.    Allergies  Allergen Reactions  . Itraconazole Diarrhea and Itching    Review of Systems negative except from HPI and PMH  Physical Exam BP 132/68 mmHg  Pulse 65  Ht 5\' 5"  (1.651 m)  Wt 160 lb 6.4 oz (72.757 kg)  BMI 26.69 kg/m2 Well developed and well nourished in no acute distress HENT normal with some left facial bruising  E scleral and icterus clear Neck Supple JVP flat; carotids brisk and full Clear to ausculation Regular rate and rhythm, no murmurs gallops or rub Soft with active  bowel sounds No clubbing cyanosis  edema Alert and oriented, grossly normal motor and sensory function Skin Warm and Dry  ECG from 9/16 demonstrated QRS duration of 128 as noted above  Assessment and  Plan  Atrial fibrillation-paroxysmal  Hypertension well controlled at home  Sinus node dysfunction  Elevated TSH  Pacemaker-Medtronic The patient's device was interrogated.  The information was reviewed. No changes were made in the programming.    Falls  Her falls remained likely balance related to her atrial fibrillation is much less symptomatic on the standing dose of diltiazem; however, it is associated with edema so we will discontinue it. We have 2 options at that point one to add a beta blocker as an alternative rate controlling  agent or given the increased frequency of atrial fibrillation is detected by her device increase her antiarrhythmic therapy. We have elected to do the latter increase in her propafenone 225 twice a day--3 times a day.  \ We will reassess her atrial fibrillation burden in about 6 or 8 weeks.  We discussed the use of the NOACs compared to Coumadin. We briefly reviewed the data of at least comparability in stroke prevention, bleeding and outcome. We discussed some of the new once wherein somewhat associated with decreased ischemic stroke risk, one to be taken daily, and has been shown to be comparable and bleeding risk to aspirin.  We also discussed bleeding associated with warfarin as well as NOACs and a wall bleeding as a complication of all these drugs intracranial bleeding is more frequently associated with warfarin then the NOACs and a GI bleeding is more commonly associated with the latter  She will look into the cost of the alternative NOACs and let us know.

## 2015-12-16 ENCOUNTER — Other Ambulatory Visit: Payer: Self-pay | Admitting: Nurse Practitioner

## 2015-12-16 ENCOUNTER — Ambulatory Visit
Admission: RE | Admit: 2015-12-16 | Discharge: 2015-12-16 | Disposition: A | Discharge status: Home / Self Care | Payer: Medicare Other | Source: Ambulatory Visit | Attending: Nurse Practitioner | Admitting: Nurse Practitioner

## 2015-12-16 DIAGNOSIS — M25572 Pain in left ankle and joints of left foot: Secondary | ICD-10-CM

## 2015-12-16 DIAGNOSIS — M25562 Pain in left knee: Secondary | ICD-10-CM

## 2015-12-16 DIAGNOSIS — W19XXXA Unspecified fall, initial encounter: Secondary | ICD-10-CM | POA: Diagnosis not present

## 2016-01-03 DIAGNOSIS — Z9181 History of falling: Secondary | ICD-10-CM | POA: Diagnosis not present

## 2016-01-03 DIAGNOSIS — R2689 Other abnormalities of gait and mobility: Secondary | ICD-10-CM | POA: Diagnosis not present

## 2016-01-05 DIAGNOSIS — Z9181 History of falling: Secondary | ICD-10-CM | POA: Diagnosis not present

## 2016-01-05 DIAGNOSIS — R2689 Other abnormalities of gait and mobility: Secondary | ICD-10-CM | POA: Diagnosis not present

## 2016-01-10 DIAGNOSIS — Z9181 History of falling: Secondary | ICD-10-CM | POA: Diagnosis not present

## 2016-01-10 DIAGNOSIS — R2689 Other abnormalities of gait and mobility: Secondary | ICD-10-CM | POA: Diagnosis not present

## 2016-01-12 DIAGNOSIS — R2689 Other abnormalities of gait and mobility: Secondary | ICD-10-CM | POA: Diagnosis not present

## 2016-01-12 DIAGNOSIS — Z9181 History of falling: Secondary | ICD-10-CM | POA: Diagnosis not present

## 2016-01-17 ENCOUNTER — Encounter: Payer: Self-pay | Admitting: Internal Medicine

## 2016-01-17 ENCOUNTER — Ambulatory Visit (INDEPENDENT_AMBULATORY_CARE_PROVIDER_SITE_OTHER): Payer: Medicare Other | Admitting: *Deleted

## 2016-01-17 ENCOUNTER — Ambulatory Visit (INDEPENDENT_AMBULATORY_CARE_PROVIDER_SITE_OTHER): Payer: Medicare Other | Admitting: Internal Medicine

## 2016-01-17 VITALS — BP 140/72 | HR 72 | Ht 65.0 in | Wt 157.0 lb

## 2016-01-17 DIAGNOSIS — I82409 Acute embolism and thrombosis of unspecified deep veins of unspecified lower extremity: Secondary | ICD-10-CM

## 2016-01-17 DIAGNOSIS — I480 Paroxysmal atrial fibrillation: Secondary | ICD-10-CM

## 2016-01-17 DIAGNOSIS — I4891 Unspecified atrial fibrillation: Secondary | ICD-10-CM

## 2016-01-17 DIAGNOSIS — Z950 Presence of cardiac pacemaker: Secondary | ICD-10-CM

## 2016-01-17 DIAGNOSIS — I495 Sick sinus syndrome: Secondary | ICD-10-CM | POA: Diagnosis not present

## 2016-01-17 DIAGNOSIS — Z5181 Encounter for therapeutic drug level monitoring: Secondary | ICD-10-CM

## 2016-01-17 LAB — CUP PACEART INCLINIC DEVICE CHECK
Battery Voltage: 2.89 V
Brady Statistic AP VP Percent: 0.1 % — CL
Brady Statistic AS VS Percent: 72.5 %
Implantable Lead Implant Date: 20061019
Implantable Lead Location: 753859
Implantable Lead Model: 5076
Lead Channel Impedance Value: 704 Ohm
Lead Channel Pacing Threshold Amplitude: 1 V
Lead Channel Pacing Threshold Amplitude: 1 V
Lead Channel Sensing Intrinsic Amplitude: 3.7 mV
MDC IDC LEAD IMPLANT DT: 20061019
MDC IDC LEAD LOCATION: 753860
MDC IDC MSMT LEADCHNL RA PACING THRESHOLD PULSEWIDTH: 0.4 ms
MDC IDC MSMT LEADCHNL RA SENSING INTR AMPL: 1.7 mV
MDC IDC MSMT LEADCHNL RV IMPEDANCE VALUE: 424 Ohm
MDC IDC MSMT LEADCHNL RV PACING THRESHOLD PULSEWIDTH: 0.4 ms
MDC IDC SESS DTM: 20170220132603
MDC IDC STAT BRADY AP VS PERCENT: 27.5 %
MDC IDC STAT BRADY AS VP PERCENT: 0.1 % — AB

## 2016-01-17 LAB — POCT INR: INR: 3

## 2016-01-17 NOTE — Progress Notes (Signed)
HPI  [name] [name] is a 82 y.o. 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   Device interrogation demonstrated paroxysms of atrial fibrillation with ventricular rates into the 180s 190s.  She was started on low-dose beta blockers. Previously she did not tolerate metoprolol succinate.  She was also switched and calcium blockers which have been associated with some peripheral edema. Her atrial fibrillation has been much better since its initiation.  At the last visit we discontinued the calcium blockers because of the edema and started her on propafenone.  She saw Dr. Mariana Single 9/16 who asked that we see her sooner because of increased burden of atrial fibrillation not withstanding the symptomatic decrease as outlined above.   Echocardiogram 8/15 demonstrated normal LV function  Laboratories were notable for mildly elevated TSH  She is on coumadin.  DATE QRSduration Dose  2/12 128 0  9/16 128 225  2/17 126 225     She continues to have falls. They do not seem to be orthostatic in nature.    She is looked into the cost of NOACs  apixoban and Rivaroxaban are lowest here     Past Medical History  Diagnosis Date  . Sinoatrial node dysfunction (HCC)   . Mitral valve regurgitation     a. echo 2009 mild to moderate MR  . Edema   . Atrial fibrillation ([place])   . Unspecified essential hypertension   . Depression   . CVA     Lacunar and cerebellar infarct  . DVT (deep venous thrombosis) ([place])     Details not available in the chart    Past Surgical History  Procedure Laterality Date  . Pacemaker insertion  09-14-2005    MDT EnRhythm dual chamber pacemaker implanted by Dr Lovena Le for SND    Current Outpatient Prescriptions  Medication Sig Dispense Refill  . allopurinol (ZYLOPRIM) 100 MG tablet Take 100 mg by mouth daily.    Marland Kitchen Apoaequorin (PREVAGEN) 10 MG CAPS Take 1  tablet by mouth daily.    . Biotin (BIOTIN 5000) 5 MG CAPS Take 5 mg by mouth daily.    . Cholecalciferol (VITAMIN D3) 2000 UNITS TABS Take 2,000 Units by mouth daily.     . COCONUT OIL PO Take 4 capsules by mouth daily.    . Cyanocobalamin (VITAMIN B-12 PO) Take 1 tablet by mouth daily.     Marland Kitchen diltiazem (CARDIZEM) 30 MG tablet Take 30 mg by mouth daily as needed. For Palpitations  6  . folic acid (FOLVITE) 1 MG tablet Take 1 mg by mouth daily.     Marland Kitchen ketoconazole (NIZORAL) 2 % shampoo Apply 1 application topically once a week.    Marland Kitchen LORazepam (ATIVAN) 0.5 MG tablet Take 0.5 mg by mouth as needed for sleep.     . propafenone (RYTHMOL) 225 MG tablet Take one tablet (225 mg) by mouth three times daily 270 tablet 3  . sertraline (ZOLOFT) 50 MG tablet Take 25 mg by mouth daily.     Marland Kitchen warfarin (COUMADIN) 5 MG tablet Take as directed by coumadin clinic 30 tablet 3   No current facility-administered medications for this visit.    Allergies  Allergen Reactions  . Itraconazole Diarrhea and Itching    Review of Systems negative except from HPI and PMH  Physical Exam BP 140/72 mmHg  Pulse 72  Ht 5\' 5"  (1.651 m)  Wt 157 lb (71.215  kg)  BMI 26.13 kg/m2 Well developed and well nourished in no acute distress HENT normal with some left facial bruising  E scleral and icterus clear Neck Supple JVP flat; carotids brisk and full Clear to ausculation Regular rate and rhythm, no murmurs gallops or rub Soft with active bowel sounds No clubbing cyanosis  edema Alert and oriented, grossly normal motor and sensory function Skin Warm and Dry  ECG from 9/16 demonstrated QRS duration of 128 as noted above  Assessment and  Plan  Atrial fibrillation-paroxysmal  Hypertension well controlled at home  Sinus node dysfunction   Pacemaker-Medtronic The patient's device was interrogated.  The information was reviewed. No changes were made in the programming.    Falls  We have again reviewed NOACs. She  will begin on apixaban. We need to assess renal function as her last, from her PCP was that there was an issue.  Atrial fibrillation is significantly reduced with propafenone which she seems to be tolerating well

## 2016-01-17 NOTE — Patient Instructions (Signed)
Medication Instructions: - Plan will be to start Eliquis once lab results are received  Labwork: - Your physician recommends that you have lab work today: BMP  Procedures/Testing: - none  Follow-Up: - Your physician recommends that you schedule a follow-up appointment in: 3 months with the [place] physician wants you to follow-up in: 6 months with Tommye Standard, PA for Dr. Caryl Comes. You will receive a reminder letter in the mail two months in advance. If you don't receive a letter, please call our office to schedule the follow-up appointment.  Any Additional Special Instructions Will Be Listed Below (If Applicable).     If you need a refill on your cardiac medications before your next appointment, please call your pharmacy.

## 2016-01-18 LAB — BASIC METABOLIC PANEL
BUN: 24 mg/dL (ref 7–25)
CHLORIDE: 107 mmol/L (ref 98–110)
CO2: 26 mmol/L (ref 20–31)
Calcium: 8.6 mg/dL (ref 8.6–10.4)
Creat: 0.94 mg/dL — ABNORMAL HIGH (ref 0.60–0.88)
Glucose, Bld: 85 mg/dL (ref 65–99)
POTASSIUM: 4.4 mmol/L (ref 3.5–5.3)
Sodium: 142 mmol/L (ref 135–146)

## 2016-01-19 DIAGNOSIS — N898 Other specified noninflammatory disorders of vagina: Secondary | ICD-10-CM | POA: Diagnosis not present

## 2016-01-21 NOTE — Addendum Note (Signed)
Addended by: Freada Bergeron on: 01/21/2016 08:34 AM   Modules accepted: Orders

## 2016-01-25 DIAGNOSIS — R2689 Other abnormalities of gait and mobility: Secondary | ICD-10-CM | POA: Diagnosis not present

## 2016-01-25 DIAGNOSIS — Z9181 History of falling: Secondary | ICD-10-CM | POA: Diagnosis not present

## 2016-01-27 DIAGNOSIS — Z9181 History of falling: Secondary | ICD-10-CM | POA: Diagnosis not present

## 2016-01-27 DIAGNOSIS — R2689 Other abnormalities of gait and mobility: Secondary | ICD-10-CM | POA: Diagnosis not present

## 2016-01-31 DIAGNOSIS — R2689 Other abnormalities of gait and mobility: Secondary | ICD-10-CM | POA: Diagnosis not present

## 2016-01-31 DIAGNOSIS — Z9181 History of falling: Secondary | ICD-10-CM | POA: Diagnosis not present

## 2016-02-04 DIAGNOSIS — Z9181 History of falling: Secondary | ICD-10-CM | POA: Diagnosis not present

## 2016-02-04 DIAGNOSIS — R2689 Other abnormalities of gait and mobility: Secondary | ICD-10-CM | POA: Diagnosis not present

## 2016-02-10 DIAGNOSIS — R2689 Other abnormalities of gait and mobility: Secondary | ICD-10-CM | POA: Diagnosis not present

## 2016-02-10 DIAGNOSIS — Z9181 History of falling: Secondary | ICD-10-CM | POA: Diagnosis not present

## 2016-02-17 NOTE — Progress Notes (Signed)
Patient ID: [name] [name], female   DOB: [birth year], 82 y.o.   MRN: [id number]  HPI  [name] [name] is a 82 y.o. 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 2006  She is admitted to hospital in early August 2015  following an episode of syncope. This followed 2 weeks after she had a fall when she missed the curb. She nicked her teeth at that point A few weeks later she fell again.  Etiology not clear seen by Dr Lovena Le who felt adding bystolic only thing needed   Device interrogation demonstrated paroxysms of atrial fibrillation with ventricular rates into the 180s 190s.  She was started on low-dose beta blockers. Previously she did not tolerate metoprolol succinate. Echocardiogram 8/15 demonstrated normal LV function  Laboratories were notable for mildly elevated TSH of 6 but normal Free T3 and T4  In August   She is on coumadin.    She is having problems using the remote device monitor    Past Medical History  Diagnosis Date  . Sinoatrial node dysfunction (HCC)   . Mitral valve regurgitation     a. echo 2009 mild to moderate MR  . Edema   . Atrial fibrillation ([place])   . Unspecified essential hypertension   . Depression   . CVA     Lacunar and cerebellar infarct  . DVT (deep venous thrombosis) ([place])     Details not available in the chart    Past Surgical History  Procedure Laterality Date  . Pacemaker insertion  09-14-2005    MDT EnRhythm dual chamber pacemaker implanted by Dr Lovena Le for SND    Current Outpatient Prescriptions  Medication Sig Dispense Refill  . allopurinol (ZYLOPRIM) 100 MG tablet Take 100 mg by mouth daily.    Marland Kitchen Apoaequorin (PREVAGEN) 10 MG CAPS Take 1 tablet by mouth daily.    . Biotin (BIOTIN 5000) 5 MG CAPS Take 5 mg by mouth daily.    . Cholecalciferol (VITAMIN D3) 2000 UNITS TABS Take 2,000 Units by mouth daily.     . COCONUT  OIL PO Take 4 capsules by mouth daily.    . Cyanocobalamin (VITAMIN B-12 PO) Take 1 tablet by mouth daily.     Marland Kitchen diltiazem (CARDIZEM) 30 MG tablet Take 30 mg by mouth daily as needed. For Palpitations  6  . folic acid (FOLVITE) 1 MG tablet Take 1 mg by mouth daily.     Marland Kitchen ketoconazole (NIZORAL) 2 % shampoo Apply 1 application topically once a week.    Marland Kitchen LORazepam (ATIVAN) 0.5 MG tablet Take 0.5 mg by mouth as needed for sleep.     . propafenone (RYTHMOL) 225 MG tablet Take one tablet (225 mg) by mouth three times daily 270 tablet 3  . sertraline (ZOLOFT) 50 MG tablet Take 25 mg by mouth daily.     Marland Kitchen warfarin (COUMADIN) 5 MG tablet Take as directed by coumadin clinic 30 tablet 3   No current facility-administered medications for this visit.    Allergies  Allergen Reactions  . Itraconazole Diarrhea and Itching    Review of Systems negative except from HPI and PMH  Physical Exam There were no vitals taken for this visit. Well developed and well nourished in no acute distress HENT normal with some left facial bruising  E scleral and icterus clear Neck Supple JVP flat; carotids brisk and full Clear to ausculation Regular rate and  rhythm, SEM  murmurs gallops or rub Pacer under left clavicle  Soft with active bowel sounds No clubbing cyanosis  edema Alert and oriented, grossly normal motor and sensory function Skin Warm and Dry   ECG      08/26/15  SR rate 63 a paced  RBBB    Assessment and  Plan  Atrial fibrillation-paroxysmal :  Continue propofenone and anticoagulation  On cardizem for rate control for infrequent paroxysms 4% by pacer interrogation   Hypertension :  Well controlled.  Continue current medications and low sodium Dash type diet.    Elevated TSH:   Lab Results  Component Value Date   TSH 6.140* 06/29/2014  Recent lab with Dr Felipa Eth "normal" per patient   Pacemaker-Medtronic Normal function    RBBB:  Chronic no change in ECG morphology unless paced      F/u with me in a year   Jenkins Rouge          This encounter was created in error - please disregard.

## 2016-02-22 ENCOUNTER — Encounter: Payer: Medicare Other | Admitting: Cardiovascular Disease

## 2016-02-23 ENCOUNTER — Telehealth: Payer: Self-pay

## 2016-02-23 ENCOUNTER — Ambulatory Visit (INDEPENDENT_AMBULATORY_CARE_PROVIDER_SITE_OTHER): Payer: Medicare Other | Admitting: *Deleted

## 2016-02-23 DIAGNOSIS — Z5181 Encounter for therapeutic drug level monitoring: Secondary | ICD-10-CM

## 2016-02-23 DIAGNOSIS — I82409 Acute embolism and thrombosis of unspecified deep veins of unspecified lower extremity: Secondary | ICD-10-CM | POA: Diagnosis not present

## 2016-02-23 DIAGNOSIS — I4891 Unspecified atrial fibrillation: Secondary | ICD-10-CM

## 2016-02-23 DIAGNOSIS — I480 Paroxysmal atrial fibrillation: Secondary | ICD-10-CM

## 2016-02-23 LAB — POCT INR: INR: 3.2

## 2016-02-23 MED ORDER — APIXABAN 5 MG PO TABS: 5.0000 mg | ORAL_TABLET | Freq: Two times a day (BID) | ORAL | Status: DC

## 2016-02-23 NOTE — Telephone Encounter (Signed)
Prior auth for Eliquis 5 mg sent to [facility]. 

## 2016-02-25 ENCOUNTER — Telehealth: Payer: Self-pay

## 2016-02-25 NOTE — Telephone Encounter (Signed)
Eliquis 5mg  is approved through 11/26/2016. [id number].

## 2016-03-22 ENCOUNTER — Ambulatory Visit (INDEPENDENT_AMBULATORY_CARE_PROVIDER_SITE_OTHER): Payer: Medicare Other | Admitting: Surgery

## 2016-03-22 DIAGNOSIS — Z5181 Encounter for therapeutic drug level monitoring: Secondary | ICD-10-CM

## 2016-03-22 DIAGNOSIS — I4891 Unspecified atrial fibrillation: Secondary | ICD-10-CM

## 2016-03-22 DIAGNOSIS — I82409 Acute embolism and thrombosis of unspecified deep veins of unspecified lower extremity: Secondary | ICD-10-CM

## 2016-03-22 LAB — CBC
HCT: 41.1 % (ref 35.0–45.0)
Hemoglobin: 13.7 g/dL (ref 11.7–15.5)
MCH: 32.9 pg (ref 27.0–33.0)
MCHC: 33.3 g/dL (ref 32.0–36.0)
MCV: 98.6 fL (ref 80.0–100.0)
MPV: 10 fL (ref 7.5–12.5)
PLATELETS: 143 10*3/uL (ref 140–400)
RBC: 4.17 MIL/uL (ref 3.80–5.10)
RDW: 15 % (ref 11.0–15.0)
WBC: 5.8 10*3/uL (ref 3.8–10.8)

## 2016-03-22 LAB — BASIC METABOLIC PANEL
BUN: 22 mg/dL (ref 7–25)
CHLORIDE: 106 mmol/L (ref 98–110)
CO2: 26 mmol/L (ref 20–31)
CREATININE: 0.88 mg/dL (ref 0.60–0.88)
Calcium: 8.8 mg/dL (ref 8.6–10.4)
Glucose, Bld: 90 mg/dL (ref 65–99)
POTASSIUM: 4 mmol/L (ref 3.5–5.3)
SODIUM: 141 mmol/L (ref 135–146)

## 2016-03-22 NOTE — Progress Notes (Signed)
Pt was started on Eliquis 5mg  twice a day for Afib on 02/25/16 by Dr. Johnsie Cancel.    Reviewed patients medication list.  Pt is not currently on any combined P-gp and strong CYP3A4 inhibitors/inducers (ketoconazole, traconazole, ritonavir, carbamazepine, phenytoin, rifampin, St. John's wort).  Reviewed labs.  SCr-0.88, Hgb-13.7, HCT-41.1, Weight-71.87kg (158.12 lbs).  Dose appropriate based on dosing criteria.   Hgb and HCT Within Normal Limits.  A full discussion of the nature of anticoagulants has been carried out.  A benefit/risk analysis has been presented to the patient, so that they understand the justification for choosing anticoagulation with Eliquis at this time.  The need for compliance is stressed.  Pt is aware to take the medication twice daily.  Side effects of potential bleeding are discussed, including unusual colored urine or stools, coughing up blood or coffee ground emesis, nose bleeds or serious fall or head trauma.  Discussed signs and symptoms of stroke. The patient should avoid any OTC items containing aspirin or ibuprofen.  Avoid alcohol consumption.   Call if any signs of abnormal bleeding.  Discussed financial obligations and resolved any difficulty in obtaining medication.  Next lab test test in 6 months.   03/22/16-pt had labs drawn for 1 month follow-up Eliquis.  LMOM to call for results & 6 month appt 03/23/16-Spoke with pt & discussed lab results& instructed to continue taking Eliquis 5mg  twice a day & she verbalized understanding.  Advised to call with any questions & she verbalized understanding.  Appointment set for 6 months for follow-up.

## 2016-04-04 ENCOUNTER — Ambulatory Visit: Payer: Self-pay | Admitting: Cardiovascular Disease

## 2016-04-17 ENCOUNTER — Encounter: Payer: Self-pay | Admitting: Internal Medicine

## 2016-04-17 ENCOUNTER — Ambulatory Visit (INDEPENDENT_AMBULATORY_CARE_PROVIDER_SITE_OTHER): Payer: Medicare Other | Admitting: *Deleted

## 2016-04-17 DIAGNOSIS — I495 Sick sinus syndrome: Secondary | ICD-10-CM

## 2016-04-17 LAB — CUP PACEART INCLINIC DEVICE CHECK
Brady Statistic AP VS Percent: 31.53 %
Brady Statistic AS VP Percent: 0 %
Brady Statistic AS VS Percent: 68.47 %
Implantable Lead Implant Date: 20061019
Implantable Lead Location: 753859
Implantable Lead Location: 753860
Lead Channel Impedance Value: 432 Ohm
Lead Channel Pacing Threshold Amplitude: 1 V
Lead Channel Pacing Threshold Pulse Width: 0.4 ms
Lead Channel Sensing Intrinsic Amplitude: 4.1 mV
Lead Channel Setting Pacing Amplitude: 2 V
Lead Channel Setting Pacing Pulse Width: 0.4 ms
Lead Channel Setting Sensing Sensitivity: 0.9 mV
MDC IDC LEAD IMPLANT DT: 20061019
MDC IDC MSMT BATTERY VOLTAGE: 2.88 V
MDC IDC MSMT LEADCHNL RA IMPEDANCE VALUE: 680 Ohm
MDC IDC MSMT LEADCHNL RA PACING THRESHOLD AMPLITUDE: 0.5 V
MDC IDC MSMT LEADCHNL RA PACING THRESHOLD PULSEWIDTH: 0.2 ms
MDC IDC MSMT LEADCHNL RA SENSING INTR AMPL: 1.9 mV
MDC IDC SESS DTM: 20170522124604
MDC IDC SET LEADCHNL RV PACING AMPLITUDE: 2.5 V
MDC IDC STAT BRADY AP VP PERCENT: 0 %
MDC IDC STAT BRADY RA PERCENT PACED: 31.53 %
MDC IDC STAT BRADY RV PERCENT PACED: 0 %

## 2016-04-17 NOTE — Progress Notes (Signed)
Pacemaker check in clinic. Normal device function. Thresholds, sensing, impedances consistent with previous measurements. Device programmed to maximize longevity. No mode switch or high ventricular rates noted. Device programmed at appropriate safety margins. Histogram distribution appropriate for patient activity level. Device programmed to optimize intrinsic conduction. Battery voltage 2.88V. ROV with device clinic on 8/21.

## 2016-05-03 NOTE — Progress Notes (Signed)
Patient ID: [name] [name], female   DOB: 03/09/1932, 82 y.o.   MRN: [id number]  HPI  [name] [name] is a 82 y.o. 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 2006  She is admitted to hospital in early August 2015  following an episode of syncope. This followed 2 weeks after she had a fall when she missed the curb. She nicked her teeth at that point A few weeks later she fell again.  Etiology not clear seen by Dr Lovena Le who felt adding bystolic only thing needed   Device interrogation demonstrated paroxysms of atrial fibrillation with ventricular rates into the 180s 190s.  Burden of PAF less on propofenone. Echocardiogram 8/15 demonstrated normal LV function  Laboratories were notable for mildly elevated TSH of 6 but normal Free T3 and T4  In August   She is now on Eliquis 5 bid for anticoagulation   She is having problems using the remote device monitor and comes to office to have it checked  Falling a lot gets off balance and has hit head twice most recently a week and a half ago.  She feels like she is not  Walking straight since then no headache nausea vomiting or focal neuro signs   Since January has had diarrhea   Etiology not clear. Been on probiotics this week with some improvement No abdominal pain but frequency keeps her from going out a lot    Past Medical History  Diagnosis Date  . Sinoatrial node dysfunction (HCC)   . Mitral valve regurgitation     a. echo 2009 mild to moderate MR  . Edema   . Atrial fibrillation ([place])   . Unspecified essential hypertension   . Depression   . CVA     Lacunar and cerebellar infarct  . DVT (deep venous thrombosis) ([place])     Details not available in the chart    Past Surgical History  Procedure Laterality Date  . Pacemaker insertion  09-14-2005    MDT EnRhythm dual chamber pacemaker implanted by Dr Lovena Le for  SND    Current Outpatient Prescriptions  Medication Sig Dispense Refill  . allopurinol (ZYLOPRIM) 100 MG tablet Take 100 mg by mouth daily.    Marland Kitchen apixaban (ELIQUIS) 5 MG TABS tablet Take 1 tablet (5 mg total) by mouth 2 (two) times daily. 60 tablet 6  . Apoaequorin (PREVAGEN) 10 MG CAPS Take 1 tablet by mouth daily.    . Biotin (BIOTIN 5000) 5 MG CAPS Take 5 mg by mouth daily.    . Cholecalciferol (VITAMIN D3) 2000 UNITS TABS Take 2,000 Units by mouth daily.     . COCONUT OIL PO Take 1 capsule by mouth daily. Reported on 03/22/2016    . Cyanocobalamin (VITAMIN B-12 PO) Take 1 tablet by mouth daily.     Marland Kitchen diltiazem (CARDIZEM) 30 MG tablet Take 30 mg by mouth daily as needed. For Palpitations  6  . folic acid (FOLVITE) 1 MG tablet Take 1 mg by mouth daily.     Marland Kitchen LORazepam (ATIVAN) 0.5 MG tablet Take 0.5 mg by mouth as needed for sleep.     . propafenone (RYTHMOL) 225 MG tablet Take one tablet (225 mg) by mouth three times daily 270 tablet 3  . sertraline (ZOLOFT) 50 MG tablet Take 50 mg by mouth daily.      No current facility-administered medications for this visit.  Allergies  Allergen Reactions  . Itraconazole Diarrhea and Itching    Review of Systems negative except from HPI and PMH  Physical Exam BP 130/66 mmHg  Pulse 62  Ht 5\' 5"  (1.651 m)  Wt 71.668 kg (158 lb)  BMI 26.29 kg/m2  SpO2 94% Well developed and well nourished in no acute distress HENT normal with some left facial bruising  E scleral and icterus clear Neck Supple JVP flat; carotids brisk and full Clear to ausculation Regular rate and rhythm, no murmurs gallops or rub Soft with active bowel sounds No clubbing cyanosis  edema Alert and oriented, grossly normal motor and sensory function Skin Warm and Dry  ECG demonstrates sinus rhythm at 61 Intervals 17/13/44  right bundle branch block   08/26/15  SR rate 63 a paced  RBBB    Assessment and  Plan  Atrial fibrillation-paroxysmal :  Continue  propofenone and eliquis   Hypertension :  Well controlled.  Continue current medications and low sodium Dash type diet.    Elevated TSH:   Lab Results  Component Value Date   TSH 6.140* 06/29/2014  Recent lab with Dr Felipa Eth "normal" per patient   Pacemaker-Medtronic The patient's device was interrogated. Normal function   They will educate on home monitoring  She has a 4% afib burden which is improved on antiarrhythmic Rx  RBBB:  Chronic no change in ECG morphology unless paced   Trauma:  On eliquis with head trauma and sensation of imbalance f/u non contrast CT r/o subdural hematoma   Diarrhea  Have called [facility] and arranged f/u with them next week not clear if she has IBS or some other food Intolerance but it has been going on since January and needs further w/u  Dr Cristina Gong not seeing new patients but He will arrange f/u for [place] :    F/u with me in a year if CT ok

## 2016-05-04 ENCOUNTER — Encounter: Payer: Self-pay | Admitting: Cardiovascular Disease

## 2016-05-04 ENCOUNTER — Ambulatory Visit (INDEPENDENT_AMBULATORY_CARE_PROVIDER_SITE_OTHER): Payer: Medicare Other | Admitting: Cardiovascular Disease

## 2016-05-04 ENCOUNTER — Ambulatory Visit (INDEPENDENT_AMBULATORY_CARE_PROVIDER_SITE_OTHER)
Admission: RE | Admit: 2016-05-04 | Discharge: 2016-05-04 | Disposition: A | Discharge status: Home / Self Care | Payer: Medicare Other | Source: Ambulatory Visit | Attending: Cardiovascular Disease | Admitting: Cardiovascular Disease

## 2016-05-04 VITALS — BP 130/66 | HR 62 | Ht 65.0 in | Wt 158.0 lb

## 2016-05-04 DIAGNOSIS — S065X9A Traumatic subdural hemorrhage with loss of consciousness of unspecified duration, initial encounter: Secondary | ICD-10-CM

## 2016-05-04 DIAGNOSIS — I6200 Nontraumatic subdural hemorrhage, unspecified: Secondary | ICD-10-CM | POA: Diagnosis not present

## 2016-05-04 DIAGNOSIS — S0990XA Unspecified injury of head, initial encounter: Secondary | ICD-10-CM | POA: Diagnosis not present

## 2016-05-04 DIAGNOSIS — S065XAA Traumatic subdural hemorrhage with loss of consciousness status unknown, initial encounter: Secondary | ICD-10-CM

## 2016-05-04 DIAGNOSIS — I495 Sick sinus syndrome: Secondary | ICD-10-CM

## 2016-05-04 DIAGNOSIS — I10 Essential (primary) hypertension: Secondary | ICD-10-CM

## 2016-05-04 NOTE — Patient Instructions (Addendum)
Medication Instructions:  Your physician recommends that you continue on your current medications as directed. Please refer to the Current Medication list given to you today.  Labwork: NONE  Testing/Procedures: Head CT scanning, (CAT scanning), is a noninvasive, special x-ray that produces cross-sectional images of the body using x-rays and a computer. CT scans help physicians diagnose and treat medical conditions. For some CT exams, a contrast material is used to enhance visibility in the area of the body being studied. CT scans provide greater clarity and reveal more details than regular x-ray exams.  Follow-Up: Your physician wants you to follow-up in: 6 months with Dr. Johnsie Cancel. You will receive a reminder letter in the mail two months in advance. If you don't receive a letter, please call our office to schedule the follow-up appointment.   If you need a refill on your cardiac medications before your next appointment, please call your pharmacy.

## 2016-05-08 DIAGNOSIS — R197 Diarrhea, unspecified: Secondary | ICD-10-CM | POA: Diagnosis not present

## 2016-06-07 ENCOUNTER — Emergency Department (HOSPITAL_COMMUNITY)
Admission: EM | Admit: 2016-06-07 | Discharge: 2016-06-07 | Disposition: A | Discharge status: Home / Self Care | Payer: Medicare Other | Attending: Emergency Medicine | Admitting: Emergency Medicine

## 2016-06-07 ENCOUNTER — Emergency Department (HOSPITAL_COMMUNITY): Payer: Medicare Other

## 2016-06-07 ENCOUNTER — Encounter (HOSPITAL_COMMUNITY): Payer: Self-pay

## 2016-06-07 DIAGNOSIS — Z7901 Long term (current) use of anticoagulants: Secondary | ICD-10-CM | POA: Insufficient documentation

## 2016-06-07 DIAGNOSIS — R220 Localized swelling, mass and lump, head: Secondary | ICD-10-CM | POA: Diagnosis not present

## 2016-06-07 DIAGNOSIS — S22020A Wedge compression fracture of second thoracic vertebra, initial encounter for closed fracture: Secondary | ICD-10-CM | POA: Diagnosis not present

## 2016-06-07 DIAGNOSIS — M545 Low back pain: Secondary | ICD-10-CM | POA: Diagnosis not present

## 2016-06-07 DIAGNOSIS — M25511 Pain in right shoulder: Secondary | ICD-10-CM | POA: Insufficient documentation

## 2016-06-07 DIAGNOSIS — M79672 Pain in left foot: Secondary | ICD-10-CM | POA: Insufficient documentation

## 2016-06-07 DIAGNOSIS — S0101XA Laceration without foreign body of scalp, initial encounter: Secondary | ICD-10-CM | POA: Insufficient documentation

## 2016-06-07 DIAGNOSIS — I10 Essential (primary) hypertension: Secondary | ICD-10-CM | POA: Insufficient documentation

## 2016-06-07 DIAGNOSIS — Y999 Unspecified external cause status: Secondary | ICD-10-CM | POA: Diagnosis not present

## 2016-06-07 DIAGNOSIS — I4891 Unspecified atrial fibrillation: Secondary | ICD-10-CM | POA: Diagnosis not present

## 2016-06-07 DIAGNOSIS — M25551 Pain in right hip: Secondary | ICD-10-CM | POA: Diagnosis not present

## 2016-06-07 DIAGNOSIS — Z87891 Personal history of nicotine dependence: Secondary | ICD-10-CM | POA: Diagnosis not present

## 2016-06-07 DIAGNOSIS — W228XXA Striking against or struck by other objects, initial encounter: Secondary | ICD-10-CM | POA: Insufficient documentation

## 2016-06-07 DIAGNOSIS — S41112A Laceration without foreign body of left upper arm, initial encounter: Secondary | ICD-10-CM | POA: Diagnosis not present

## 2016-06-07 DIAGNOSIS — S0091XA Abrasion of unspecified part of head, initial encounter: Secondary | ICD-10-CM | POA: Diagnosis not present

## 2016-06-07 DIAGNOSIS — Z8673 Personal history of transient ischemic attack (TIA), and cerebral infarction without residual deficits: Secondary | ICD-10-CM | POA: Diagnosis not present

## 2016-06-07 DIAGNOSIS — S199XXA Unspecified injury of neck, initial encounter: Secondary | ICD-10-CM | POA: Diagnosis not present

## 2016-06-07 DIAGNOSIS — Y9289 Other specified places as the place of occurrence of the external cause: Secondary | ICD-10-CM | POA: Diagnosis not present

## 2016-06-07 DIAGNOSIS — W19XXXA Unspecified fall, initial encounter: Secondary | ICD-10-CM

## 2016-06-07 DIAGNOSIS — S91312A Laceration without foreign body, left foot, initial encounter: Secondary | ICD-10-CM | POA: Diagnosis not present

## 2016-06-07 DIAGNOSIS — S0990XA Unspecified injury of head, initial encounter: Secondary | ICD-10-CM | POA: Diagnosis not present

## 2016-06-07 DIAGNOSIS — Y939 Activity, unspecified: Secondary | ICD-10-CM | POA: Diagnosis not present

## 2016-06-07 DIAGNOSIS — M79622 Pain in left upper arm: Secondary | ICD-10-CM | POA: Insufficient documentation

## 2016-06-07 DIAGNOSIS — Z79899 Other long term (current) drug therapy: Secondary | ICD-10-CM | POA: Insufficient documentation

## 2016-06-07 DIAGNOSIS — Z950 Presence of cardiac pacemaker: Secondary | ICD-10-CM | POA: Insufficient documentation

## 2016-06-07 DIAGNOSIS — S40011A Contusion of right shoulder, initial encounter: Secondary | ICD-10-CM | POA: Diagnosis not present

## 2016-06-07 DIAGNOSIS — S79911A Unspecified injury of right hip, initial encounter: Secondary | ICD-10-CM | POA: Diagnosis not present

## 2016-06-07 MED ORDER — ONDANSETRON 4 MG PO TBDP
4.0000 mg | ORAL_TABLET | Freq: Once | ORAL | Status: AC
  Administered 2016-06-07: 4 mg via ORAL
  Filled 2016-06-07: qty 1

## 2016-06-07 NOTE — ED Provider Notes (Signed)
CSN: [id number]     Arrival date & time 06/07/16  1638 History   First MD Initiated Contact with Patient 06/07/16 1642     Chief Complaint  Patient presents with  . Fall     (Consider location/radiation/quality/duration/timing/severity/associated sxs/prior Treatment) Patient is a 82 y.o. female presenting with fall.  Fall This is a new problem. The current episode started today. The problem occurs rarely. Associated symptoms include nausea. Pertinent negatives include no abdominal pain, chest pain, coughing, diaphoresis, fatigue, fever, headaches, myalgias, numbness, sore throat, vertigo, visual change, vomiting or weakness.  Patient was getting on a downward escalator with her hands full when she fell backwards striking her head against the escalator. She denies loss of consciousness or neck pain. She's had minimal blood loss from the posterior scalp. She has mild nausea but no vomiting, vision changes, or headache. She has mild pain at the site of injury on the posterior scalp. She also has mild pain in the left humerus, right posterior shoulder, right buttock, and left foot.   Part a falling she had mild imbalance that she states she has been consistent over the last 2-3 years. She has a cane at home but doesn't like to use it. She's fallen a few times over the last previous few years for similar reasons she is currently on eloquis for A. fib. She denies any prodromal palpitations, lightheadedness, or chest pain prior to falling.  Past Medical History  Diagnosis Date  . Sinoatrial node dysfunction (HCC)   . Mitral valve regurgitation     a. echo 2009 mild to moderate MR  . Edema   . Atrial fibrillation ([place])   . Unspecified essential hypertension   . Depression   . CVA     Lacunar and cerebellar infarct  . DVT (deep venous thrombosis) ([place])     Details not available in the chart   Past Surgical History  Procedure Laterality Date  . Pacemaker insertion  09-14-2005    MDT EnRhythm  dual chamber pacemaker implanted by Dr Lovena Le for SND   Family History  Problem Relation Age of Onset  . Cancer Mother   . Mitral valve prolapse Mother   . Diabetes Father   . Hypertension Father   . Coronary artery disease Sister   . Stroke Brother   . Coronary artery disease Sister    Social History  Substance Use Topics  . Smoking status: Former Smoker    Quit date: 08/13/1984  . Smokeless tobacco: Never Used  . Alcohol Use: 8.4 oz/week    14 Glasses of wine per week   OB History    No data available     Review of Systems  Constitutional: Negative for fever, diaphoresis and fatigue.  HENT: Negative for sore throat.   Eyes: Negative for visual disturbance.  Respiratory: Negative for cough and shortness of breath.   Cardiovascular: Negative for chest pain and palpitations.  Gastrointestinal: Positive for nausea. Negative for vomiting and abdominal pain.  Musculoskeletal: Negative for myalgias.  Neurological: Negative for vertigo, weakness, numbness and headaches.      Allergies  Itraconazole and Lactose intolerance (gi)  Home Medications   Prior to Admission medications   Medication Sig Start Date End Date Taking? Authorizing Provider  acetaminophen (TYLENOL) 650 MG CR tablet Take 650 mg by mouth every 8 (eight) hours as needed for pain.   Yes Historical Provider, MD  allopurinol (ZYLOPRIM) 100 MG tablet Take 100 mg by mouth daily.   Yes Historical Provider,  MD  apixaban (ELIQUIS) 5 MG TABS tablet Take 1 tablet (5 mg total) by mouth 2 (two) times daily. 02/23/16  Yes Deboraha Sprang, MD  Apoaequorin (PREVAGEN) 10 MG CAPS Take 1 capsule by mouth daily.    Yes Historical Provider, MD  Biotin (BIOTIN 5000) 5 MG CAPS Take 5 mg by mouth daily.   Yes Historical Provider, MD  Cholecalciferol (VITAMIN D3) 2000 UNITS TABS Take 2,000 Units by mouth daily.    Yes Historical Provider, MD  Cyanocobalamin (VITAMIN B-12 PO) Take 1 tablet by mouth daily.    Yes Historical Provider,  MD  diltiazem (CARDIZEM) 30 MG tablet Take 30 mg by mouth daily as needed. FOR PALPITATIONS 12/13/15  Yes Historical Provider, MD  folic acid (FOLVITE) 1 MG tablet Take 1 mg by mouth daily.    Yes Historical Provider, MD  LORazepam (ATIVAN) 0.5 MG tablet Take 0.5 mg by mouth at bedtime as needed for sleep.    Yes Historical Provider, MD  propafenone (RYTHMOL) 225 MG tablet Take one tablet (225 mg) by mouth three times daily Patient taking differently: Take 225 mg by mouth 3 (three) times daily.  11/24/15  Yes Deboraha Sprang, MD  sertraline (ZOLOFT) 50 MG tablet Take 50 mg by mouth daily.  07/18/13  Yes Historical Provider, MD   BP 146/72 mmHg  Pulse 62  Temp(Src) 98.3 F (36.8 C)  Resp 16  Ht 5\' 5"  (1.651 m)  Wt 68.04 kg  BMI 24.96 kg/m2  SpO2 91% Physical Exam  Constitutional: She is oriented to person, place, and time. She appears well-developed and well-nourished. No distress.  HENT:  Head: Normocephalic.  0.5 cm cutaneous laceration in the posterior scalp. Hemostatic.   Eyes: Conjunctivae are normal.  Cardiovascular: Normal rate and normal heart sounds.   No murmur heard. Pulmonary/Chest: Effort normal and breath sounds normal.  Abdominal: Soft. There is no tenderness.  Musculoskeletal: She exhibits no edema.  No tenderness of the C-spine. Mild tenderness of the right posterior shoulder, left humerus, right buttock, and left foot. She demonstrates full range of motion with no obvious deformities of the limbs. Compartments in the affected extremities are soft with normal sensation.   Neurological: She is alert and oriented to person, place, and time. No cranial nerve deficit.  Normal strength in the bilateral upper and lower extremities.  Skin: Skin is warm. She is not diaphoretic.  Psychiatric: She has a normal mood and affect. Her behavior is normal.  Nursing note and vitals reviewed.   ED Course  .Marland KitchenLaceration Repair Date/Time: 06/07/2016 8:11 PM Performed by: Doree Albee,  Ellenora Talton Authorized by: Doree Albee, Krystyne Tewksbury Consent: Verbal consent obtained. Risks and benefits: risks, benefits and alternatives were discussed Consent given by: patient Patient understanding: patient states understanding of the procedure being performed Required items: required blood products, implants, devices, and special equipment available Patient identity confirmed: verbally with patient Body area: head/neck Location details: scalp Laceration length: 0.5 cm Foreign bodies: no foreign bodies Tendon involvement: none Nerve involvement: none Vascular damage: no Patient sedated: no Irrigation solution: saline Irrigation method: syringe Amount of cleaning: standard Skin closure: glue Approximation: close Approximation difficulty: simple Patient tolerance: Patient tolerated the procedure well with no immediate complications   (including critical care time) Labs Review Labs Reviewed - No data to display  Imaging Review Dg Shoulder Right  06/07/2016  CLINICAL DATA:  Fall down asked later. Bruising tooth superior aspect of right shoulder. EXAM: RIGHT SHOULDER - 2+ VIEW COMPARISON:  None. FINDINGS: The right  shoulder is located. No acute bone or soft tissue abnormality is present. The right clavicle is intact. The visualized right hemi thorax is clear. IMPRESSION: Negative right shoulder radiographs. Electronically Signed   By: San Morelle M.D.   On: 06/07/2016 19:03   Ct Head Wo Contrast  06/07/2016  CLINICAL DATA:  Fall with trauma to head. Laceration to posterior scalp. EXAM: CT HEAD WITHOUT CONTRAST CT CERVICAL SPINE WITHOUT CONTRAST TECHNIQUE: Multidetector CT imaging of the head and cervical spine was performed following the standard protocol without intravenous contrast. Multiplanar CT image reconstructions of the cervical spine were also generated. COMPARISON:  CT head without contrast 05/04/2016. CT of the cervical spine 06/29/2014 FINDINGS: CT HEAD FINDINGS Moderate  generalized atrophy and white matter disease is stable. Basal ganglia are intact. The insular ribbon is normal bilaterally. No acute infarct, hemorrhage, or mass lesion is present. The ventricles are proportionate to the degree of atrophy. No significant extra-axial fluid collection is present. The calvarium is intact. Focal soft tissue swelling is present in the posterior occipital scalp without an underlying fracture. No other significant extracranial soft tissue injury is evident. Bilateral lens replacements are present. The globes and orbits are otherwise intact. CT CERVICAL SPINE FINDINGS Cervical spine is imaged from skullbase through the cervical thoracic junction. An acute superior endplate fractures present at T2 anteriorly. There is 30% loss of height. No significant kyphosis is present. Chronic loss of disc height at C5-6 and C6-7 is stable. There is fusion across the disc space at C3-4, unchanged. Slight anterolisthesis at C4-5 is stable. Asymmetric left-sided facet hypertrophy is present at C3-4 and C4-5. No other acute fractures are present. Centrilobular emphysema is present. The thyroid is within normal limits. No significant vascular calcifications are present. IMPRESSION: 1. Acute/subacute to anterior superior endplate compression fracture at T2 with 30% loss of height. 2. Otherwise stable degenerative changes in the cervical spine without other fractures. 3. Stable moderate generalized atrophy and white matter disease. 4. Soft tissue swelling in the posterior occipital scalp without underlying fracture. These results were called by telephone at the time of interpretation on 06/07/2016 at 6:26 pm to Dr. Billy Fischer , who verbally acknowledged these results. Electronically Signed   By: San Morelle M.D.   On: 06/07/2016 18:26   Ct Cervical Spine Wo Contrast  06/07/2016  CLINICAL DATA:  Fall with trauma to head. Laceration to posterior scalp. EXAM: CT HEAD WITHOUT CONTRAST CT CERVICAL SPINE  WITHOUT CONTRAST TECHNIQUE: Multidetector CT imaging of the head and cervical spine was performed following the standard protocol without intravenous contrast. Multiplanar CT image reconstructions of the cervical spine were also generated. COMPARISON:  CT head without contrast 05/04/2016. CT of the cervical spine 06/29/2014 FINDINGS: CT HEAD FINDINGS Moderate generalized atrophy and white matter disease is stable. Basal ganglia are intact. The insular ribbon is normal bilaterally. No acute infarct, hemorrhage, or mass lesion is present. The ventricles are proportionate to the degree of atrophy. No significant extra-axial fluid collection is present. The calvarium is intact. Focal soft tissue swelling is present in the posterior occipital scalp without an underlying fracture. No other significant extracranial soft tissue injury is evident. Bilateral lens replacements are present. The globes and orbits are otherwise intact. CT CERVICAL SPINE FINDINGS Cervical spine is imaged from skullbase through the cervical thoracic junction. An acute superior endplate fractures present at T2 anteriorly. There is 30% loss of height. No significant kyphosis is present. Chronic loss of disc height at C5-6 and C6-7 is stable. There is  fusion across the disc space at C3-4, unchanged. Slight anterolisthesis at C4-5 is stable. Asymmetric left-sided facet hypertrophy is present at C3-4 and C4-5. No other acute fractures are present. Centrilobular emphysema is present. The thyroid is within normal limits. No significant vascular calcifications are present. IMPRESSION: 1. Acute/subacute to anterior superior endplate compression fracture at T2 with 30% loss of height. 2. Otherwise stable degenerative changes in the cervical spine without other fractures. 3. Stable moderate generalized atrophy and white matter disease. 4. Soft tissue swelling in the posterior occipital scalp without underlying fracture. These results were called by telephone  at the time of interpretation on 06/07/2016 at 6:26 pm to Dr. Billy Fischer , who verbally acknowledged these results. Electronically Signed   By: San Morelle M.D.   On: 06/07/2016 18:26   Dg Humerus Left  06/07/2016  CLINICAL DATA:  Fall down S clear.  Laceration to left upper arm. EXAM: LEFT HUMERUS - 2+ VIEW COMPARISON:  None. FINDINGS: The laceration is not discretely visualized. The humerus is intact. The elbow and shoulder are unremarkable. IMPRESSION: Negative left humerus radiographs. Electronically Signed   By: San Morelle M.D.   On: 06/07/2016 19:05   Dg Foot Complete Left  06/07/2016  CLINICAL DATA:  [place] down and has her later today with skin laceration to lateral left foot. EXAM: LEFT FOOT - COMPLETE 3+ VIEW COMPARISON:  None. FINDINGS: Bones are demineralized. Hallux valgus deformity. Degenerative changes noted MTP joint great toe. No fracture. No subluxation or dislocation. IMPRESSION: Negative. Electronically Signed   By: Misty Stanley M.D.   On: 06/07/2016 19:03   Dg Hip Unilat With Pelvis 2-3 Views Right  06/07/2016  CLINICAL DATA:  [place] down the skull later.  Right hip pain. EXAM: DG HIP (WITH OR WITHOUT PELVIS) 2-3V RIGHT COMPARISON:  No comparison studies available. FINDINGS: Bones are demineralized. SI joints and symphysis pubis are normal. No pubic ramus fracture. AP and frog-leg lateral views of the right hip show no femoral neck fracture. Possible bone island right femoral neck. IMPRESSION: Negative. Electronically Signed   By: Misty Stanley M.D.   On: 06/07/2016 19:04   I have personally reviewed and evaluated these images and lab results as part of my medical decision-making.   EKG Interpretation None      MDM   Final diagnoses:  Fall  Traumatic compression fracture of T2 thoracic vertebra, closed, initial encounter [facility])   Patient is an 82 year old female on qloquis for A. fib presenting today for a fall from standing in which she struck the back of  her head. She has minor thoracic tenderness and was found to have an acute/subacute T2 compression fracture with 30% height loss. The case was discussed with the neurosurgeon who agreed that there is no specific therapy needed and the patient can be discharged safely home. She is without neurologic deficit and was able to ambulate in the hallway prior to discharge. CT of the head did not show any acute intracranial abnormality. There are no fractures of the cervical spine.  The etiology of her fall was likely chronic gait instability. She has been encouraged in the past to use a cane to ambulate and was again encouraged to ambulate at home with a cane after discharge.  She had generalized soreness over the left humerus, right buttock, right shoulder and left foot. X-rays of these extremities do not demonstrate any acute fracture or malformation. Patient was discharged home in good condition with instructions to follow up with PCP. She was  also given strict return precautions. A small wound on her posterior scalp was repaired as in the procedure note above.   Allie Bossier, MD 06/07/16 2015  Gareth Morgan, MD 06/12/16 1606

## 2016-06-07 NOTE — ED Notes (Signed)
Pt presents to ed with skin tear to the middle back of her head, bleeding controlled, a skin tear to her left upper arm, bleeding controlled, and a skin tear to her right elbow, bleeding controlled, cms intact

## 2016-06-07 NOTE — ED Notes (Signed)
pts nausea has subsided and is able to ambulate with some minimal assistance, patient is tolerating po fluids, complaints of pain in her right buttock with ambulation er md notified

## 2016-06-07 NOTE — ED Notes (Signed)
Pt. BIB [facility] for evaluation of headache after mechanical fall today in [place].Pt. Was going down escalator when she tripped and fell backwards. Pt. Is on elliquis for afib. Pt has 2 inch circular abrasion to back of head, 2 inch skin tear to L bicep, small abrasions to L foot, and small scrape to R elbow. Pt. AxO x4, denies LOC. Pt. States 8/10 headache, denies neck/back pain.

## 2016-06-07 NOTE — ED Notes (Signed)
Pt up to ambulate, pt ambulated with assistance and got nauseated, er md notified

## 2016-06-14 DIAGNOSIS — I480 Paroxysmal atrial fibrillation: Secondary | ICD-10-CM | POA: Diagnosis not present

## 2016-06-14 DIAGNOSIS — M8588 Other specified disorders of bone density and structure, other site: Secondary | ICD-10-CM | POA: Diagnosis not present

## 2016-06-14 DIAGNOSIS — M4854XA Collapsed vertebra, not elsewhere classified, thoracic region, initial encounter for fracture: Secondary | ICD-10-CM | POA: Diagnosis not present

## 2016-06-14 DIAGNOSIS — M85859 Other specified disorders of bone density and structure, unspecified thigh: Secondary | ICD-10-CM | POA: Diagnosis not present

## 2016-06-14 DIAGNOSIS — S40812A Abrasion of left upper arm, initial encounter: Secondary | ICD-10-CM | POA: Diagnosis not present

## 2016-06-14 DIAGNOSIS — M85851 Other specified disorders of bone density and structure, right thigh: Secondary | ICD-10-CM | POA: Diagnosis not present

## 2016-07-17 ENCOUNTER — Ambulatory Visit (INDEPENDENT_AMBULATORY_CARE_PROVIDER_SITE_OTHER): Payer: Medicare Other | Admitting: *Deleted

## 2016-07-17 ENCOUNTER — Encounter: Payer: Self-pay | Admitting: Internal Medicine

## 2016-07-17 DIAGNOSIS — Z950 Presence of cardiac pacemaker: Secondary | ICD-10-CM

## 2016-07-17 DIAGNOSIS — I495 Sick sinus syndrome: Secondary | ICD-10-CM | POA: Diagnosis not present

## 2016-07-17 LAB — CUP PACEART INCLINIC DEVICE CHECK
Battery Voltage: 2.86 V
Brady Statistic AP VP Percent: 0 %
Brady Statistic AS VP Percent: 0 %
Brady Statistic RA Percent Paced: 34.72 %
Brady Statistic RV Percent Paced: 0 %
Date Time Interrogation Session: 20170821134937
Implantable Lead Implant Date: 20061019
Implantable Lead Location: 753860
Implantable Lead Model: 5076
Lead Channel Impedance Value: 720 Ohm
Lead Channel Pacing Threshold Amplitude: 0.5 V
Lead Channel Pacing Threshold Amplitude: 1 V
Lead Channel Pacing Threshold Pulse Width: 0.4 ms
Lead Channel Sensing Intrinsic Amplitude: 2.837 mV
Lead Channel Setting Pacing Amplitude: 2.5 V
Lead Channel Setting Sensing Sensitivity: 0.9 mV
MDC IDC LEAD IMPLANT DT: 20061019
MDC IDC LEAD LOCATION: 753859
MDC IDC MSMT LEADCHNL RV IMPEDANCE VALUE: 432 Ohm
MDC IDC MSMT LEADCHNL RV PACING THRESHOLD PULSEWIDTH: 0.4 ms
MDC IDC MSMT LEADCHNL RV SENSING INTR AMPL: 4.063 mV
MDC IDC SET LEADCHNL RA PACING AMPLITUDE: 2 V
MDC IDC SET LEADCHNL RV PACING PULSEWIDTH: 0.4 ms
MDC IDC STAT BRADY AP VS PERCENT: 34.72 %
MDC IDC STAT BRADY AS VS PERCENT: 65.28 %

## 2016-07-17 NOTE — Progress Notes (Signed)
Pacemaker check in clinic. Normal device function. Thresholds, sensing, impedances consistent with previous measurements. Device programmed to maximize longevity. No mode switch or high ventricular rates noted. Device programmed at appropriate safety margins. Histogram distribution appropriate for patient activity level. Device programmed to optimize intrinsic conduction. Battery voltage 2.86V, ERI at 2.81V. Patient education completed. ROV with device clinic on 09/14/16 for battery check (N/C) and ROV with SK in 12/2016.

## 2016-07-27 DIAGNOSIS — L243 Irritant contact dermatitis due to cosmetics: Secondary | ICD-10-CM | POA: Diagnosis not present

## 2016-07-27 DIAGNOSIS — L218 Other seborrheic dermatitis: Secondary | ICD-10-CM | POA: Diagnosis not present

## 2016-09-04 DIAGNOSIS — H01001 Unspecified blepharitis right upper eyelid: Secondary | ICD-10-CM | POA: Diagnosis not present

## 2016-09-04 DIAGNOSIS — H02105 Unspecified ectropion of left lower eyelid: Secondary | ICD-10-CM | POA: Diagnosis not present

## 2016-09-04 DIAGNOSIS — H01004 Unspecified blepharitis left upper eyelid: Secondary | ICD-10-CM | POA: Diagnosis not present

## 2016-09-14 ENCOUNTER — Telehealth: Payer: Self-pay | Admitting: *Deleted

## 2016-09-14 ENCOUNTER — Ambulatory Visit (INDEPENDENT_AMBULATORY_CARE_PROVIDER_SITE_OTHER): Payer: Medicare Other | Admitting: *Deleted

## 2016-09-14 ENCOUNTER — Ambulatory Visit (INDEPENDENT_AMBULATORY_CARE_PROVIDER_SITE_OTHER): Payer: Medicare Other

## 2016-09-14 DIAGNOSIS — I82409 Acute embolism and thrombosis of unspecified deep veins of unspecified lower extremity: Secondary | ICD-10-CM

## 2016-09-14 DIAGNOSIS — I4891 Unspecified atrial fibrillation: Secondary | ICD-10-CM | POA: Diagnosis not present

## 2016-09-14 DIAGNOSIS — Z950 Presence of cardiac pacemaker: Secondary | ICD-10-CM

## 2016-09-14 LAB — BASIC METABOLIC PANEL
BUN: 22 mg/dL (ref 7–25)
CALCIUM: 8.8 mg/dL (ref 8.6–10.4)
CO2: 28 mmol/L (ref 20–31)
CREATININE: 0.93 mg/dL — AB (ref 0.60–0.88)
Chloride: 109 mmol/L (ref 98–110)
GLUCOSE: 121 mg/dL — AB (ref 65–99)
POTASSIUM: 4 mmol/L (ref 3.5–5.3)
Sodium: 144 mmol/L (ref 135–146)

## 2016-09-14 LAB — CBC
HCT: 42.3 % (ref 35.0–45.0)
Hemoglobin: 13.8 g/dL (ref 11.7–15.5)
MCH: 31.9 pg (ref 27.0–33.0)
MCHC: 32.6 g/dL (ref 32.0–36.0)
MCV: 97.9 fL (ref 80.0–100.0)
MPV: 10.2 fL (ref 7.5–12.5)
PLATELETS: 134 10*3/uL — AB (ref 140–400)
RBC: 4.32 MIL/uL (ref 3.80–5.10)
RDW: 14.1 % (ref 11.0–15.0)
WBC: 6.2 10*3/uL (ref 3.8–10.8)

## 2016-09-14 LAB — CUP PACEART INCLINIC DEVICE CHECK
Brady Statistic RV Percent Paced: 0 %
Date Time Interrogation Session: 20171019160148
Implantable Lead Implant Date: 20061019
Implantable Lead Location: 753859
Implantable Lead Model: 5076
Lead Channel Impedance Value: 408 Ohm
MDC IDC LEAD IMPLANT DT: 20061019
MDC IDC LEAD LOCATION: 753860
MDC IDC MSMT BATTERY VOLTAGE: 2.85 V
MDC IDC MSMT LEADCHNL RA IMPEDANCE VALUE: 680 Ohm
MDC IDC SET LEADCHNL RA PACING AMPLITUDE: 2 V
MDC IDC SET LEADCHNL RV PACING AMPLITUDE: 2.5 V
MDC IDC SET LEADCHNL RV PACING PULSEWIDTH: 0.4 ms
MDC IDC SET LEADCHNL RV SENSING SENSITIVITY: 0.9 mV
MDC IDC STAT BRADY AP VP PERCENT: 0 %
MDC IDC STAT BRADY AP VS PERCENT: 37 %
MDC IDC STAT BRADY AS VP PERCENT: 0 %
MDC IDC STAT BRADY AS VS PERCENT: 63 %
MDC IDC STAT BRADY RA PERCENT PACED: 37 %

## 2016-09-14 NOTE — Progress Notes (Signed)
Pt was started on Eliquis 5mg  BID for afib on 02/25/16 by Dr Johnsie Cancel.    Reviewed patients medication list.  Pt is not currently on any combined P-gp and strong CYP3A4 inhibitors/inducers (ketoconazole, traconazole, ritonavir, carbamazepine, phenytoin, rifampin, St. John's wort).  Reviewed labs.  SCr 0.93, Weight 158.8 lbs, Age-30.  Dose appropriate based on age, weight, and SCr.  Hgb and HCT Within Normal Limits 13.8/42.3 on 09/14/16.  Attempted to call pt and notify Eliquis 5mg  BID dosage is appropriate.  Continue on same dosage repeat CBC and BMP in 6 months. LMOM TCB.   Spoke with pt 09/18/16 pt aware to continue on Eliquis 5mg  BID.  Pt pending f/u appt with Dr Caryl Comes 12/2016 reminder in [place], will repeat 6 mo f/u labwork at that appt.  Orders in Epic as well as note on reminder appt.

## 2016-09-14 NOTE — Progress Notes (Signed)
Battery check in clinic. Battery voltage 2.85V, ERI at 2.81V. No episodes. ROV with RU on 10/26/16.

## 2016-09-14 NOTE — Telephone Encounter (Signed)
Called patient as I noted that she is due to f/u with Tommye Standard, PA in 09/2016.  Advised patient that we should plan to cancel her Device Clinic appointment that day and move it to Renee's schedule.  Patient is agreeable to appointment with Tommye Standard, PA on 10/26/16 at 11:30am.  She is appreciative of call and denies additional questions or concerns at this time.

## 2016-09-14 NOTE — Patient Instructions (Signed)

## 2016-10-11 DIAGNOSIS — H01005 Unspecified blepharitis left lower eyelid: Secondary | ICD-10-CM | POA: Diagnosis not present

## 2016-10-11 DIAGNOSIS — H02105 Unspecified ectropion of left lower eyelid: Secondary | ICD-10-CM | POA: Diagnosis not present

## 2016-10-13 ENCOUNTER — Other Ambulatory Visit: Payer: Self-pay | Admitting: *Deleted

## 2016-10-13 MED ORDER — APIXABAN 5 MG PO TABS: 5.0000 mg | ORAL_TABLET | Freq: Two times a day (BID) | ORAL | 2 refills | Status: DC

## 2016-10-26 ENCOUNTER — Ambulatory Visit (INDEPENDENT_AMBULATORY_CARE_PROVIDER_SITE_OTHER): Payer: Medicare Other | Admitting: Physician Assistant

## 2016-10-26 VITALS — BP 124/80 | HR 78 | Ht 65.0 in | Wt 157.0 lb

## 2016-10-26 DIAGNOSIS — Z79899 Other long term (current) drug therapy: Secondary | ICD-10-CM

## 2016-10-26 DIAGNOSIS — H02135 Senile ectropion of left lower eyelid: Secondary | ICD-10-CM | POA: Diagnosis not present

## 2016-10-26 DIAGNOSIS — I495 Sick sinus syndrome: Secondary | ICD-10-CM

## 2016-10-26 DIAGNOSIS — Z23 Encounter for immunization: Secondary | ICD-10-CM | POA: Diagnosis not present

## 2016-10-26 DIAGNOSIS — I10 Essential (primary) hypertension: Secondary | ICD-10-CM

## 2016-10-26 DIAGNOSIS — H11432 Conjunctival hyperemia, left eye: Secondary | ICD-10-CM | POA: Diagnosis not present

## 2016-10-26 DIAGNOSIS — I480 Paroxysmal atrial fibrillation: Secondary | ICD-10-CM | POA: Diagnosis not present

## 2016-10-26 DIAGNOSIS — R0789 Other chest pain: Secondary | ICD-10-CM

## 2016-10-26 DIAGNOSIS — H02132 Senile ectropion of right lower eyelid: Secondary | ICD-10-CM | POA: Diagnosis not present

## 2016-10-26 NOTE — Progress Notes (Signed)
Cardiology Office Note Date:  10/26/2016  Patient ID:  [name] [name], [name] [name] [birth year], MRN [id number] PCP:  Mathews Argyle, MD  Cardiologist:  Dr. Johnsie Cancel Electrophysiologist: Dr. Caryl Comes    Chief Complaint: planned , nearing ERI  History of Present Illness: DAPHNEY HOPKE is a 82 y.o. female with history of PAFib, tcahy-brady w/PPM, HTN, CVA, DVT comes in today for a planned visit to be seen for Dr. Caryl Comes.  She was last seen by him in Feb.  At that time from an AF standpoint doing well with the Rythmol, though mentioned another mechanical fall.  At that visit she was changed from warfarin to Eliquis.  She is feeling well.  She says yes to question of CP, but has correlated it only at night after having heavier meals, none otherwise.  No palpitations, no SOB, no exertional intolerances.  She reports her last fall was in June, was on an escalator in [place] says she had several bags had adjusted her load to try and even them out but lost her balance and fel backwards down a few steps.  No LOC.  She was brought via EMS to the hospital says "they scanned everything" and had no injuries.  She reports historically had fallen fairly frequently, all with an explanation, never syncope.  She feels though that in general she is stronger and her last fall as mentioned several months ago now.  She denies any bleeding or signs of bleeding.  AFib Hx Intolerant of metoprolol (unclear why) Intolerant of CCB with edema AAD w/ Rythmol DATE QRSduration Dose  2/12 128 0  9/16 128 225     2/17                                          126                                                      225    11/17                                         132                                                      225 TID         Device information: MDT dual chamber PPM, implanted 09/14/05, Dr. Lovena Le (follows w/ Dr. Caryl Comes)   Past Medical History:  Diagnosis Date  . Atrial fibrillation ([place])   . CVA    Lacunar and cerebellar infarct  . Depression   . DVT (deep venous thrombosis) ([place])    Details not available in the chart  . Edema   . Mitral valve regurgitation    a. echo 2009 mild to moderate MR  . Sinoatrial node dysfunction ([facility])   . Unspecified essential hypertension     Past Surgical History:  Procedure Laterality Date  . PACEMAKER INSERTION  09-14-2005   MDT EnRhythm dual chamber pacemaker  implanted by Dr Lovena Le for SND    Current Outpatient Prescriptions  Medication Sig Dispense Refill  . acetaminophen (TYLENOL) 650 MG CR tablet Take 650 mg by mouth every 8 (eight) hours as needed for pain.    Marland Kitchen allopurinol (ZYLOPRIM) 100 MG tablet Take 100 mg by mouth daily.    Marland Kitchen apixaban (ELIQUIS) 5 MG TABS tablet Take 1 tablet (5 mg total) by mouth 2 (two) times daily. 60 tablet 2  . Apoaequorin (PREVAGEN) 10 MG CAPS Take 1 capsule by mouth daily.     . Biotin (BIOTIN 5000) 5 MG CAPS Take 5 mg by mouth daily.    . Cholecalciferol (VITAMIN D3) 2000 UNITS TABS Take 2,000 Units by mouth daily.     . Cyanocobalamin (VITAMIN B-12 PO) Take 1 tablet by mouth daily.     Marland Kitchen diltiazem (CARDIZEM) 30 MG tablet Take 30 mg by mouth daily as needed. FOR PALPITATIONS  6  . folic acid (FOLVITE) 1 MG tablet Take 1 mg by mouth daily.     Marland Kitchen LORazepam (ATIVAN) 0.5 MG tablet Take 0.5 mg by mouth at bedtime as needed for sleep.     . propafenone (RYTHMOL) 225 MG tablet Take one tablet (225 mg) by mouth three times daily (Patient taking differently: Take 225 mg by mouth 3 (three) times daily. ) 270 tablet 3  . sertraline (ZOLOFT) 50 MG tablet Take 50 mg by mouth daily.      No current facility-administered medications for this visit.     Allergies:   Itraconazole and Lactose intolerance (gi)   Social History:  The patient  reports that she quit smoking about 32 years ago. She has never used smokeless tobacco. She reports that she drinks about 8.4 oz of alcohol per week . She reports that she does not use  drugs.   Family History:  The patient's family history includes Cancer in her mother; Coronary artery disease in her sister and sister; Diabetes in her father; Hypertension in her father; Mitral valve prolapse in her mother; Stroke in her brother.  ROS:  Please see the history of present illness.  All other systems are reviewed and otherwise negative.   PHYSICAL EXAM:  VS:  BP 124/80   Pulse 78   Ht 5\' 5"  (1.651 m)   Wt 157 lb (71.2 kg)   BMI 26.13 kg/m  BMI: Body mass index is 26.13 kg/m. Well nourished, well developed, in no acute distress  HEENT: normocephalic, atraumatic  Neck: no JVD, carotid bruits or masses Cardiac: RRR; no significant murmurs, no rubs, or gallops Lungs:  clear to auscultation bilaterally, no wheezing, rhonchi or rales  Abd: soft, nontender MS: no deformity age appropriate atrophy Ext: no edema, chronic looking skin changes, excellent pedal pulse b/l Skin: warm and dry, no rash Neuro:  No gross deficits appreciated Psych: euthymic mood, full affect  PPM site is stable, no tethering or discomfort   EKG:  Done today and reviewed by myself is SR, RBBB, QTc 501, QRS 132, PR 194, some A paced beats PPM interrogation done today and reviewed by myself: stable battery and lead measurements, she has not reached ERI yet, no episodes or observations.  She A paces 37%, V paces 0%  06/30/14: TTE - Left ventricle: The cavity size was normal. Systolic function was normal. The estimated ejection fraction was in the range of 50% to 55%. Wall motion was normal; there were no regional wall motion abnormalities. Left ventricular diastolic function parameters were normal. -  Aortic valve: Trileaflet; mildly thickened, mildly calcified leaflets. There was no significant regurgitation. - Aortic root: The aortic root was normal in size. - Mitral valve: Calcified annulus. Mildly thickened leaflets . There was mild regurgitation. - Left atrium: The atrium was at the  upper limits of normal in size. - Right ventricle: Systolic function was normal. - Right atrium: The atrium was normal in size. - Tricuspid valve: There was moderate regurgitation. - Pulmonary arteries: Systolic pressure was mildly increased. PA peak pressure: 42 mm Hg (S) Impressions - Normal biventricular size and function. Mild pulmonary hypertension.    Recent Labs: 09/14/2016: BUN 22; Creat 0.93; Hemoglobin 13.8; Platelets 134; Potassium 4.0; Sodium 144  No results found for requested labs within last 8760 hours.   CrCl cannot be calculated (Patient's most recent lab result is older than the maximum 21 days allowed.).   Wt Readings from Last 3 Encounters:  10/26/16 157 lb (71.2 kg)  06/07/16 150 lb (68 kg)  05/04/16 158 lb (71.7 kg)     Other studies reviewed: Additional studies/records reviewed today include: summarized above  ASSESSMENT AND PLAN:  1. PAFib     CHA2DS2Vasc is 6 on Eliquis  2. Tachy-brady w/PPP     device function is stable  3. HTN     Stable  4. CP     Patient has found a correlation with heavy meals, no EKG changes from last     She is instructed to notify if any change or escalation in this symptoms, advised to avoid heavy/large meals, especially right before bed.  Disposition: F/u with device clinic in 3 months, Dr. Caryl Comes in 11mo sooner if needed  Current medicines are reviewed at length with the patient today.  The patient did not have any concerns regarding medicines.  Haywood Lasso, PA-C 10/26/2016 12:36 PM     [place] [address] [phone] (office)  [phone] (fax)

## 2016-10-26 NOTE — Patient Instructions (Signed)
Medication Instructions:   Your physician recommends that you continue on your current medications as directed. Please refer to the Current Medication list given to you today.     If you need a refill on your cardiac medications before your next appointment, please call your pharmacy.  Labwork: NONE ORDERED  TODAY    Testing/Procedures: NONE ORDERED  TODAY    Follow-Up: [place]    Your physician wants you to follow[place]up in:  [place].Marland KitchenYou will receive a reminder letter in the mail two months in advance. If you don't receive a letter, please call our office to schedule the follow-up appointment.      Any Other Special Instructions Will Be Listed Below (If Applicable).

## 2016-11-24 ENCOUNTER — Other Ambulatory Visit: Payer: Self-pay | Admitting: Geriatric Medicine

## 2016-11-24 ENCOUNTER — Ambulatory Visit
Admission: RE | Admit: 2016-11-24 | Discharge: 2016-11-24 | Disposition: A | Discharge status: Home / Self Care | Payer: Medicare Other | Source: Ambulatory Visit | Attending: Geriatric Medicine | Admitting: Geriatric Medicine

## 2016-11-24 DIAGNOSIS — R05 Cough: Secondary | ICD-10-CM | POA: Diagnosis not present

## 2016-11-24 DIAGNOSIS — N183 Chronic kidney disease, stage 3 (moderate): Secondary | ICD-10-CM | POA: Diagnosis not present

## 2016-11-24 DIAGNOSIS — R197 Diarrhea, unspecified: Secondary | ICD-10-CM | POA: Diagnosis not present

## 2016-11-24 DIAGNOSIS — Z0000 Encounter for general adult medical examination without abnormal findings: Secondary | ICD-10-CM | POA: Diagnosis not present

## 2016-11-24 DIAGNOSIS — Z1389 Encounter for screening for other disorder: Secondary | ICD-10-CM | POA: Diagnosis not present

## 2016-11-24 DIAGNOSIS — D696 Thrombocytopenia, unspecified: Secondary | ICD-10-CM | POA: Diagnosis not present

## 2016-11-24 DIAGNOSIS — Z79899 Other long term (current) drug therapy: Secondary | ICD-10-CM | POA: Diagnosis not present

## 2016-11-24 DIAGNOSIS — I480 Paroxysmal atrial fibrillation: Secondary | ICD-10-CM | POA: Diagnosis not present

## 2016-11-24 DIAGNOSIS — R059 Cough, unspecified: Secondary | ICD-10-CM

## 2016-11-24 DIAGNOSIS — J449 Chronic obstructive pulmonary disease, unspecified: Secondary | ICD-10-CM | POA: Diagnosis not present

## 2016-11-24 DIAGNOSIS — E7800 Pure hypercholesterolemia, unspecified: Secondary | ICD-10-CM | POA: Diagnosis not present

## 2017-01-19 ENCOUNTER — Ambulatory Visit: Payer: Self-pay | Admitting: Cardiovascular Disease

## 2017-01-24 ENCOUNTER — Other Ambulatory Visit: Payer: Self-pay | Admitting: Internal Medicine

## 2017-01-30 NOTE — Progress Notes (Signed)
Patient ID: [name] [name], female   DOB: June 30, 1932, 83 y.o.   MRN: [id number]  HPI  [name] [name] is a 83 y.o. 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 2006  She is admitted to hospital in early August 2015  following an episode of syncope. This followed 2 weeks after she had a fall when she missed the curb. She nicked her teeth at that point A few weeks later she fell again.  Etiology not clear seen by Dr Lovena Le who felt adding bystolic only thing needed   Burden of PAF less on propofenone. Last PaceArt 09/2016 no episodes PAF  Echocardiogram 8/15 demonstrated normal LV function  She is now on Eliquis 5 bid for anticoagulation   She is having problems using the remote device monitor and comes to office to have it checked  Sinuses hurt no fever congested. Wants to see different doctor than [facility]    Past Medical History:  Diagnosis Date  . Atrial fibrillation ([facility] [facility])   . CVA    Lacunar and cerebellar infarct  . Depression   . DVT (deep venous thrombosis) ([facility])    Details not available in the chart  . Edema   . Mitral valve regurgitation    a. echo 2009 mild to moderate MR  . Sinoatrial node dysfunction ([facility])   . Unspecified essential hypertension     Past Surgical History:  Procedure Laterality Date  . PACEMAKER INSERTION  09-14-2005   MDT EnRhythm dual chamber pacemaker implanted by Dr Lovena Le for SND    Current Outpatient Prescriptions  Medication Sig Dispense Refill  . acetaminophen (TYLENOL) 650 MG CR tablet Take 650 mg by mouth every 8 (eight) hours as needed for pain.    Marland Kitchen allopurinol (ZYLOPRIM) 100 MG tablet Take 100 mg by mouth daily.    Marland Kitchen apixaban (ELIQUIS) 5 MG TABS tablet Take 5 mg by mouth 2 (two) times daily.    Marland Kitchen Apoaequorin (PREVAGEN) 10 MG CAPS Take 1 capsule by mouth daily.     . Biotin (BIOTIN 5000) 5 MG CAPS Take 5 mg by  mouth daily.    . Cholecalciferol (VITAMIN D3) 2000 UNITS TABS Take 2,000 Units by mouth daily.     . Cyanocobalamin (VITAMIN B-12 PO) Take 1 tablet by mouth daily.     Marland Kitchen diltiazem (CARDIZEM) 30 MG tablet Take 30 mg by mouth daily as needed. FOR PALPITATIONS  6  . folic acid (FOLVITE) 1 MG tablet Take 1 mg by mouth daily.     Marland Kitchen LORazepam (ATIVAN) 0.5 MG tablet Take 0.5 mg by mouth at bedtime as needed for sleep.     . propafenone (RYTHMOL) 225 MG tablet Take 1 tablet (225 mg total) by mouth 3 (three) times daily. 270 tablet 2  . sertraline (ZOLOFT) 50 MG tablet Take 50 mg by mouth daily.     Marland Kitchen amoxicillin-clavulanate (AUGMENTIN) 875-125 MG tablet Take 1 tablet by mouth 2 (two) times daily. 14 tablet 1   No current facility-administered medications for this visit.     Allergies  Allergen Reactions  . Itraconazole Diarrhea and Itching  . Lactose Intolerance (Gi) Diarrhea and Nausea Only    Possible allergy, per patient (??)    Review of Systems negative except from HPI and PMH  Physical Exam BP 116/80   Pulse 60   Ht 5' 5.5" (1.664 m)   Wt 156 lb 1.9 oz (  70.8 kg)   BMI 25.58 kg/m  Well developed and well nourished in no acute distress HENT normal with some left facial bruising  E scleral and icterus clear Neck Supple JVP flat; carotids brisk and full Clear to ausculation Regular rate and rhythm, no murmurs gallops or rub Soft with active bowel sounds No clubbing cyanosis  edema Alert and oriented, grossly normal motor and sensory function Skin Warm and Dry  ECG demonstrates sinus rhythm at 61 Intervals 17/13/44  right bundle branch block   08/26/15  SR rate 63 a paced  RBBB    Assessment and  Plan  Atrial fibrillation-paroxysmal :  Continue propofenone and eliquis Good suppression on device interrogation  Hypertension :  Well controlled.  Continue current medications and low sodium Dash type diet.    Elevated TSH:  Normal now labs with Dr Loyal Buba The patient's device was interrogated. Normal function   They will educate on home monitoring  She has a 4% afib burden which is improved on antiarrhythmic Rx  RBBB:  Chronic no change in ECG morphology unless paced  Sinusitis:  Will call in course of augmentin she defers sinus xray's at this time   Jenkins Rouge

## 2017-02-06 ENCOUNTER — Encounter: Payer: Self-pay | Admitting: Cardiovascular Disease

## 2017-02-12 ENCOUNTER — Ambulatory Visit (INDEPENDENT_AMBULATORY_CARE_PROVIDER_SITE_OTHER): Payer: Medicare Other | Admitting: Cardiovascular Disease

## 2017-02-12 ENCOUNTER — Ambulatory Visit (INDEPENDENT_AMBULATORY_CARE_PROVIDER_SITE_OTHER): Payer: Medicare Other | Admitting: *Deleted

## 2017-02-12 VITALS — BP 116/80 | HR 60 | Ht 65.5 in | Wt 156.1 lb

## 2017-02-12 DIAGNOSIS — I495 Sick sinus syndrome: Secondary | ICD-10-CM | POA: Diagnosis not present

## 2017-02-12 DIAGNOSIS — I480 Paroxysmal atrial fibrillation: Secondary | ICD-10-CM | POA: Diagnosis not present

## 2017-02-12 LAB — CUP PACEART INCLINIC DEVICE CHECK
Battery Voltage: 2.82 V
Brady Statistic AP VP Percent: 0 %
Brady Statistic AS VP Percent: 0 %
Brady Statistic AS VS Percent: 71.97 %
Brady Statistic RV Percent Paced: 0 %
Implantable Lead Implant Date: 20061019
Implantable Lead Location: 753859
Implantable Lead Location: 753860
Implantable Pulse Generator Implant Date: 20061019
Lead Channel Impedance Value: 680 Ohm
Lead Channel Pacing Threshold Amplitude: 1 V
Lead Channel Pacing Threshold Amplitude: 1 V
Lead Channel Pacing Threshold Pulse Width: 0.4 ms
Lead Channel Pacing Threshold Pulse Width: 0.4 ms
Lead Channel Sensing Intrinsic Amplitude: 2.401 mV
Lead Channel Sensing Intrinsic Amplitude: 5.078 mV
Lead Channel Setting Pacing Amplitude: 2 V
Lead Channel Setting Pacing Amplitude: 2.5 V
Lead Channel Setting Pacing Pulse Width: 0.4 ms
Lead Channel Setting Sensing Sensitivity: 0.9 mV
MDC IDC LEAD IMPLANT DT: 20061019
MDC IDC MSMT LEADCHNL RV IMPEDANCE VALUE: 440 Ohm
MDC IDC SESS DTM: 20180319161533
MDC IDC STAT BRADY AP VS PERCENT: 28.03 %
MDC IDC STAT BRADY RA PERCENT PACED: 28.03 %

## 2017-02-12 MED ORDER — AMOXICILLIN-POT CLAVULANATE 875-125 MG PO TABS: 1.0000 | ORAL_TABLET | Freq: Two times a day (BID) | ORAL | 1 refills | Status: DC

## 2017-02-12 NOTE — Patient Instructions (Addendum)
Medication Instructions:  Your physician has recommended you make the following change in your medication:  1-Augmentin 875/125 mg by mouth twice daily  Labwork: NONE  Testing/Procedures: NONE  Follow-Up: Your physician wants you to follow-up in: 6 months with Dr. Johnsie Cancel. You will receive a reminder letter in the mail two months in advance. If you don't receive a letter, please call our office to schedule the follow-up appointment.   If you need a refill on your cardiac medications before your next appointment, please call your pharmacy.

## 2017-02-12 NOTE — Progress Notes (Signed)
Pacemaker check in clinic. Normal device function. Thresholds, sensing, impedances consistent with previous measurements. Device programmed to maximize longevity. No mode switch or high ventricular rates noted. Device programmed at appropriate safety margins. Histogram distribution appropriate for patient activity level. Device programmed to optimize intrinsic conduction. Batt voltage 2.82V (ERI 2.81V). Patient will follow up on 5/7 @ 1130.

## 2017-02-22 ENCOUNTER — Telehealth: Payer: Self-pay | Admitting: Cardiovascular Disease

## 2017-02-22 MED ORDER — AMOXICILLIN 500 MG PO CAPS: 500.0000 mg | ORAL_CAPSULE | Freq: Two times a day (BID) | ORAL | 0 refills | Status: AC

## 2017-02-22 NOTE — Telephone Encounter (Signed)
I spoke to patient and advised her that rx sent in. I advised her amoxil can also cause diarrhea and to continue probiotic. She voiced understanding, thanks, and agreed with plan.

## 2017-02-22 NOTE — Telephone Encounter (Signed)
I called and spoke to patient. She states Augmentin that Dr. Johnsie Cancel (for sinus infection) gave her caused explosive diarrhea.  She states she would like "Augmentin without the numbers". I advised her I think she is referring to plain amoxicillin.  She agrees.  She states she was taking probiotic with Augmentin, did not help. She reports diarrhea has resolved since she stopped taking ATB.  She states she has 3 days of ATB left to take and sinus symptoms have improved, but not gone away completely. Patient is requesting Dr. Johnsie Cancel rx Amoxicillin. Please advise.

## 2017-02-22 NOTE — Telephone Encounter (Signed)
New message   Pt c/o medication issue:  1. Name of Medication: amoxicillin   2. How are you currently taking this medication (dosage and times per day)?   3. Are you having a reaction (difficulty breathing--STAT)? Pt stated she was having diarrhea.   4. What is your medication issue? Pt states she went to the pharmacy and was advised to ask to be given Augmentin

## 2017-02-22 NOTE — Telephone Encounter (Signed)
Ok to call in amoxicillin 500 bid for 5 days with one refill

## 2017-03-28 ENCOUNTER — Other Ambulatory Visit: Payer: Self-pay | Admitting: *Deleted

## 2017-03-28 MED ORDER — DILTIAZEM HCL 30 MG PO TABS: 30.0000 mg | ORAL_TABLET | Freq: Every day | ORAL | 6 refills | Status: DC | PRN

## 2017-03-29 ENCOUNTER — Encounter (INDEPENDENT_AMBULATORY_CARE_PROVIDER_SITE_OTHER): Payer: Self-pay

## 2017-03-29 ENCOUNTER — Ambulatory Visit (INDEPENDENT_AMBULATORY_CARE_PROVIDER_SITE_OTHER): Payer: Medicare Other | Admitting: *Deleted

## 2017-03-29 ENCOUNTER — Telehealth: Payer: Self-pay | Admitting: Internal Medicine

## 2017-03-29 DIAGNOSIS — I495 Sick sinus syndrome: Secondary | ICD-10-CM

## 2017-03-29 LAB — CUP PACEART INCLINIC DEVICE CHECK
Brady Statistic AP VP Percent: 0.1 % — CL
Brady Statistic AS VP Percent: 14.1 %
Implantable Lead Implant Date: 20061019
Implantable Lead Location: 753859
Implantable Lead Model: 5076
Lead Channel Pacing Threshold Pulse Width: 0.4 ms
Lead Channel Pacing Threshold Pulse Width: 0.4 ms
Lead Channel Sensing Intrinsic Amplitude: 4.4 mV
Lead Channel Setting Pacing Amplitude: 2 V
Lead Channel Setting Sensing Sensitivity: 0.9 mV
MDC IDC LEAD IMPLANT DT: 20061019
MDC IDC LEAD LOCATION: 753860
MDC IDC MSMT LEADCHNL RA PACING THRESHOLD AMPLITUDE: 1 V
MDC IDC MSMT LEADCHNL RA SENSING INTR AMPL: 3.5 mV
MDC IDC MSMT LEADCHNL RV PACING THRESHOLD AMPLITUDE: 1 V
MDC IDC PG IMPLANT DT: 20061019
MDC IDC SESS DTM: 20180503171211
MDC IDC SET LEADCHNL RV PACING AMPLITUDE: 2.5 V
MDC IDC SET LEADCHNL RV PACING PULSEWIDTH: 0.4 ms
MDC IDC STAT BRADY AP VS PERCENT: 27.3 %
MDC IDC STAT BRADY AS VS PERCENT: 58.6 %

## 2017-03-29 NOTE — Telephone Encounter (Signed)
I called and spoke with the patient. She states that she has noted some palpitations and not feeling well. She states she has tried a diltiazem and lorazepam to see if this will help.  She does notice some relief from this, but states she has no energy. She was scheduled for a device check on 04/02/17 as she is nearing ERI. We have offered for her to come in today at 2 pm to see the device nurse and she is agreeable.

## 2017-03-29 NOTE — Telephone Encounter (Signed)
New message    Pt states she is having strong heart beats. She said she feels like she might pass out. She said it's not like afib. She said it's been going on for about a week.

## 2017-03-29 NOTE — Progress Notes (Signed)
Pacemaker check in clinic. Normal device function. Thresholds, sensing, impedances consistent with previous measurements. Device programmed to maximize longevity. No mode switch or high ventricular rates noted. Device programmed at appropriate safety margins. Histogram distribution appropriate for patient activity level. Battery Voltage ERI=2.81 V on March 20, 2017, mode set to VVI 65 bpm and all AT/AF therapies set to off, patient states she feels palpitations; per SK-- reprogrammed mode to AAIR<=>DDDR, reprogrammed lower rate to 60 bpm. Patient education completed. Message sent to [facility] to schedule next available appointment with SK to discuss Gen Change.

## 2017-04-18 ENCOUNTER — Ambulatory Visit (INDEPENDENT_AMBULATORY_CARE_PROVIDER_SITE_OTHER): Payer: Medicare Other | Admitting: Physician Assistant

## 2017-04-18 VITALS — BP 122/78 | HR 63 | Ht 65.5 in | Wt 157.0 lb

## 2017-04-18 DIAGNOSIS — I471 Supraventricular tachycardia: Secondary | ICD-10-CM

## 2017-04-18 DIAGNOSIS — I10 Essential (primary) hypertension: Secondary | ICD-10-CM | POA: Diagnosis not present

## 2017-04-18 DIAGNOSIS — I480 Paroxysmal atrial fibrillation: Secondary | ICD-10-CM | POA: Diagnosis not present

## 2017-04-18 DIAGNOSIS — Z79899 Other long term (current) drug therapy: Secondary | ICD-10-CM | POA: Diagnosis not present

## 2017-04-18 NOTE — Progress Notes (Signed)
Cardiology Office Note Date:  04/18/2017  Patient ID:  [name], [name] 29-Apr-1932, MRN [id number] PCP:  Lajean Manes, MD  Cardiologist:  Dr. Johnsie Cancel Electrophysiologist: Dr. Caryl Comes    Chief Complaint: PPM at ERI  History of Present Illness: [name] [name] is a 84 y.o. female with history of PAFib, tcahy-brady w/PPM, HTN, CVA, DVT comes in today to be seen for Dr. Caryl Comes.  She was last seen by him in Feb 2017 .  At that time from an AF standpoint doing well with the Rythmol, though mentioned another mechanical fall.  At that visit she was changed from warfarin to Eliquis.  She was more recently seen by myself in November, was feeling well.    She says yes to question of CP, but has correlated it only at night after having heavier meals, none otherwise.  No palpitations, no SOB, no exertional intolerances.  She reports her last fall was in June, was on an escalator in [place] says she had several bags had adjusted her load to try and even them out but lost her balance and fel backwards down a few steps.  No LOC.  She was brought via EMS to the hospital says "they scanned everything" and had no injuries.  She reports historically had fallen fairly frequently, all with an explanation, never syncope.  She feels though that in general she is stronger and her last fall as mentioned several months ago now.  She denies any bleeding or signs of bleeding.  ABout a month ago she felt like her heart beat was different, made he feel like she did prior to her pacer, just didn't feel well, maybe even lightheaded, she started taking her 30mg  cardizem tablet sBID and this seemed to resolve it.  She did come in and her device was reprogrammed from VVI (at [facility]) back to AAIR<>DDDR.  She denies any further falls and generally feels like she is stronger with a stronger more steady gait.  No CP, or SOB, no syncope.  AFib Hx Intolerant of metoprolol (unclear why) Intolerant of CCB with edema AAD w/  Rythmol DATE QRSduration Dose  2/12 128 0  9/16 128 225     2/17                                          126                                                      225    11/17                                         132                                                      225 TID         Device information: MDT dual chamber PPM, implanted 09/14/05, Dr. Lovena Le (follows w/ Dr. Caryl Comes)   Past Medical  History:  Diagnosis Date  . Atrial fibrillation ([place])   . CVA    Lacunar and cerebellar infarct  . Depression   . DVT (deep venous thrombosis) ([place])    Details not available in the chart  . Edema   . Mitral valve regurgitation    a. echo 2009 mild to moderate MR  . Sinoatrial node dysfunction ([facility])   . Unspecified essential hypertension     Past Surgical History:  Procedure Laterality Date  . PACEMAKER INSERTION  09-14-2005   MDT EnRhythm dual chamber pacemaker implanted by Dr Lovena Le for SND    Current Outpatient Prescriptions  Medication Sig Dispense Refill  . acetaminophen (TYLENOL) 650 MG CR tablet Take 650 mg by mouth every 8 (eight) hours as needed for pain.    Marland Kitchen allopurinol (ZYLOPRIM) 100 MG tablet Take 100 mg by mouth daily.    Marland Kitchen apixaban (ELIQUIS) 5 MG TABS tablet Take 5 mg by mouth 2 (two) times daily.    Marland Kitchen Apoaequorin (PREVAGEN) 10 MG CAPS Take 1 capsule by mouth daily.     . Biotin (BIOTIN 5000) 5 MG CAPS Take 5 mg by mouth daily.    . Cholecalciferol (VITAMIN D3) 2000 UNITS TABS Take 2,000 Units by mouth daily.     . Cyanocobalamin (VITAMIN B-12 PO) Take 1 tablet by mouth daily.     Marland Kitchen diltiazem (CARDIZEM) 30 MG tablet Take 1 tablet (30 mg total) by mouth daily as needed. FOR PALPITATIONS 45 tablet 6  . folic acid (FOLVITE) 1 MG tablet Take 1 mg by mouth daily.     Marland Kitchen LORazepam (ATIVAN) 0.5 MG tablet Take 0.5 mg by mouth at bedtime as needed for sleep.     . propafenone (RYTHMOL) 225 MG tablet Take 1 tablet (225 mg total) by mouth 3 (three) times daily. 270 tablet 2  .  sertraline (ZOLOFT) 50 MG tablet Take 50 mg by mouth daily.      No current facility-administered medications for this visit.     Allergies:   Itraconazole and Lactose intolerance (gi)   Social History:  The patient  reports that she quit smoking about 32 years ago. She has never used smokeless tobacco. She reports that she drinks about 8.4 oz of alcohol per week . She reports that she does not use drugs.   Family History:  The patient's family history includes Cancer in her mother; Coronary artery disease in her sister and sister; Diabetes in her father; Hypertension in her father; Mitral valve prolapse in her mother; Stroke in her brother.  ROS:  Please see the history of present illness.  All other systems are reviewed and otherwise negative.   PHYSICAL EXAM:  VS:  There were no vitals taken for this visit. BMI: There is no height or weight on file to calculate BMI. Well nourished, well developed, in no acute distress  HEENT: normocephalic, atraumatic  Neck: no JVD, carotid bruits or masses Cardiac: RRR; no significant murmurs, no rubs, or gallops Lungs:  CTA b/l, no wheezing, rhonchi or rales  Abd: soft, nontender MS: no deformity age appropriate atrophy Ext: no edema, chronic looking skin changes, excellent pedal pulse b/l Skin: warm and dry, no rash Neuro:  No gross deficits appreciated Psych: euthymic mood, full affect  PPM site is stable, no tethering or discomfort   EKG:  Done today and reviewed by myself is A paced, RBBB,QTc 489, QRS 132, PR 194 PPM interrogation done today by industry and reviewed by myself: battery reached  ERI 03/20/17, lead measurements are stable no arrhythmias, AP 44%, V paced <1%  06/30/14: TTE - Left ventricle: The cavity size was normal. Systolic function was normal. The estimated ejection fraction was in the range of 50% to 55%. Wall motion was normal; there were no regional wall motion abnormalities. Left ventricular diastolic  function parameters were normal. - Aortic valve: Trileaflet; mildly thickened, mildly calcified leaflets. There was no significant regurgitation. - Aortic root: The aortic root was normal in size. - Mitral valve: Calcified annulus. Mildly thickened leaflets . There was mild regurgitation. - Left atrium: The atrium was at the upper limits of normal in size. - Right ventricle: Systolic function was normal. - Right atrium: The atrium was normal in size. - Tricuspid valve: There was moderate regurgitation. - Pulmonary arteries: Systolic pressure was mildly increased. PA peak pressure: 42 mm Hg (S) Impressions - Normal biventricular size and function. Mild pulmonary hypertension.    Recent Labs: 09/14/2016: BUN 22; Creat 0.93; Hemoglobin 13.8; Platelets 134; Potassium 4.0; Sodium 144  No results found for requested labs within last 8760 hours.   CrCl cannot be calculated (Patient's most recent lab result is older than the maximum 21 days allowed.).   Wt Readings from Last 3 Encounters:  02/12/17 156 lb 1.9 oz (70.8 kg)  10/26/16 157 lb (71.2 kg)  06/07/16 150 lb (68 kg)     Other studies reviewed: Additional studies/records reviewed today include: summarized above  ASSESSMENT AND PLAN:  1. PAFib    CHA2DS2Vasc is 6 on Eliquis  2. Tachy-brady w/PPM     Battery has reached ERI     Discussed with the patient generator change procedure and its risks/benifts, she woiuld like to proceed  3. HTN     Stable, no changes   Disposition: Dr. Olin Pia calendar is not available this afternoon, CMA will call the patient tomorrow to schedule, the patient would like to proceed soon, labs will be drawn, I instructed her not to take her Eliquis the day prior or the morning of her procedure, and will plan for routine post procedure follow up.  Current medicines are reviewed at length with the patient today.  The patient did not have any concerns regarding  medicines.  Haywood Lasso, PA-C 04/18/2017 5:21 AM     [facility] [address] [place] [address] [phone] (office)  [phone] (fax)

## 2017-04-18 NOTE — Patient Instructions (Signed)
Medication Instructions:   Your physician recommends that you continue on your current medications as directed. Please refer to the Current Medication list given to you today.   If you need a refill on your cardiac medications before your next appointment, please call your pharmacy.  Labwork: BMET AND CBC  TODAY    Testing/Procedures:  YOU WILL BE CONTACTED FOR FURTHER STEPS  FOR PROCEDURE DATE AND TIMES    Follow-Up: YOU WILL BE CONTACTED FOR FURTHER STEPS    Any Other Special Instructions Will Be Listed Below (If Applicable).

## 2017-04-19 ENCOUNTER — Telehealth: Payer: Self-pay | Admitting: *Deleted

## 2017-04-19 LAB — BASIC METABOLIC PANEL
BUN / CREAT RATIO: 19 (ref 12–28)
BUN: 18 mg/dL (ref 8–27)
CALCIUM: 9.5 mg/dL (ref 8.7–10.3)
CO2: 25 mmol/L (ref 18–29)
CREATININE: 0.95 mg/dL (ref 0.57–1.00)
Chloride: 104 mmol/L (ref 96–106)
GFR calc Af Amer: 65 mL/min/{1.73_m2} (ref >59)
GFR, EST NON AFRICAN AMERICAN: 56 mL/min/{1.73_m2} — AB (ref >59)
Glucose: 87 mg/dL (ref 65–99)
Potassium: 4.4 mmol/L (ref 3.5–5.2)
Sodium: 144 mmol/L (ref 134–144)

## 2017-04-19 LAB — CBC
HEMATOCRIT: 41.7 % (ref 34.0–46.6)
HEMOGLOBIN: 14.2 g/dL (ref 11.1–15.9)
MCH: 32.8 pg (ref 26.6–33.0)
MCHC: 34.1 g/dL (ref 31.5–35.7)
MCV: 96 fL (ref 79–97)
Platelets: 149 10*3/uL — ABNORMAL LOW (ref 150–379)
RBC: 4.33 x10E6/uL (ref 3.77–5.28)
RDW: 14.8 % (ref 12.3–15.4)
WBC: 6.2 10*3/uL (ref 3.4–10.8)

## 2017-04-19 NOTE — Telephone Encounter (Signed)
SPOKE TO PT ABOUT AVAILABLE DATES FOR GEN CHANGE PER HEATHER [facility] 04-30-17 FIRST CASE  05-07-17  05-21-17.OPEN  PT WILL CONTACT BACK SOON WHICH DATE WILL BE GOOD FOR HER TO GET FOR HER WITH FAMILY BEING HERE TO HELP.

## 2017-04-24 ENCOUNTER — Ambulatory Visit (INDEPENDENT_AMBULATORY_CARE_PROVIDER_SITE_OTHER): Payer: Medicare Other | Admitting: Physician Assistant

## 2017-04-24 ENCOUNTER — Encounter: Payer: Self-pay | Admitting: Physician Assistant

## 2017-04-24 VITALS — BP 149/84 | HR 72 | Temp 97.7°F | Resp 18 | Ht 65.5 in | Wt 154.8 lb

## 2017-04-24 DIAGNOSIS — R82998 Other abnormal findings in urine: Secondary | ICD-10-CM

## 2017-04-24 DIAGNOSIS — R8299 Other abnormal findings in urine: Secondary | ICD-10-CM | POA: Diagnosis not present

## 2017-04-24 LAB — POCT URINALYSIS DIP (MANUAL ENTRY)
BILIRUBIN UA: NEGATIVE mg/dL
Bilirubin, UA: NEGATIVE
Blood, UA: NEGATIVE
GLUCOSE UA: NEGATIVE mg/dL
Leukocytes, UA: NEGATIVE
Nitrite, UA: NEGATIVE
PROTEIN UA: NEGATIVE mg/dL
Urobilinogen, UA: 0.2 E.U./dL
pH, UA: 5 (ref 5.0–8.0)

## 2017-04-24 NOTE — Patient Instructions (Addendum)
Try to increase your water intake to help keep your urine dilate.  We will contact you regarding your urine sample and if it has any bacteria.    IF you received an x-ray today, you will receive an invoice from [facility]. Please contact [facility] at [phone] with questions or concerns regarding your invoice.   IF you received labwork today, you will receive an invoice from [facility]. Please contact [facility] at [phone] with questions or concerns regarding your invoice.   Our billing staff will not be able to assist you with questions regarding bills from these companies.  You will be contacted with the lab results as soon as they are available. The fastest way to get your results is to activate your My Chart account. Instructions are located on the last page of this paperwork. If you have not heard from [place] regarding the results in 2 weeks, please contact this office.

## 2017-04-24 NOTE — Progress Notes (Signed)
[name] [name]  MRN: [id number] DOB: 1932/03/14  PCP: Lajean Manes, MD  Chief Complaint  Patient presents with  . Dark urine    Subjective:  Pt presents to clinic for concerns about urine is darker than normal over the last week - seems to be worse at night.  Got worse last night with the dark color.  Some problems with inability to control her urine. She has had some decrease in water intake - since she has increased her water intake the urine has gotten more clear.  No urinary frequency or dysuria but she has had some urgency to the point of accidents over the last day or so.  Some discomfort in her right flank area that is not related to anything - she does have some problems with hard stools but not a change for her recently.  H/o cystitis - has been on trimethaprim in the past  Review of Systems  Constitutional: Negative for chills and fever.    Patient Active Problem List   Diagnosis Date Noted  . PAF (paroxysmal atrial fibrillation) ([place]) 06/29/2014  . SVT (supraventricular tachycardia) ([place]) 06/29/2014  . Frequent falls 06/29/2014  . Chronic anticoagulation 06/29/2014  . Pacemaker 06/29/2014  . Encounter for therapeutic drug monitoring 12/31/2013  . Edema 10/21/2013  . Hair thinning 09/26/2012  . Mixed hyperlipidemia 02/15/2010  . PACEMAKER-[employer]tr[employer] 08/12/2009  . MITRAL INSUFFICIENCY 02/17/2009  . Essential hypertension 02/17/2009  . PAROXYSMAL ATRIAL FIBRILLATION 02/17/2009  . SICK SINUS SYNDROME 02/17/2009  . DVT 02/17/2009    Current Outpatient Prescriptions on File Prior to Visit  Medication Sig Dispense Refill  . acetaminophen (TYLENOL) 650 MG CR tablet Take 650 mg by mouth every 8 (eight) hours as needed for pain.    Marland Kitchen allopurinol (ZYLOPRIM) 100 MG tablet Take 100 mg by mouth daily.    Marland Kitchen apixaban (ELIQUIS) 5 MG TABS tablet Take 5 mg by mouth 2 (two) times daily.    Marland Kitchen Apoaequorin (PREVAGEN) 10 MG CAPS Take 1 capsule by mouth daily.     . Biotin  (BIOTIN 5000) 5 MG CAPS Take 5 mg by mouth daily.    . Cholecalciferol (VITAMIN D3) 2000 UNITS TABS Take 2,000 Units by mouth daily.     . Cyanocobalamin (VITAMIN B-12 PO) Take 1 tablet by mouth daily.     Marland Kitchen diltiazem (CARDIZEM) 30 MG tablet Take 1 tablet (30 mg total) by mouth daily as needed. FOR PALPITATIONS 45 tablet 6  . folic acid (FOLVITE) 1 MG tablet Take 1 mg by mouth daily.     Marland Kitchen LORazepam (ATIVAN) 0.5 MG tablet Take 0.5 mg by mouth at bedtime as needed for sleep.     . propafenone (RYTHMOL) 225 MG tablet Take 1 tablet (225 mg total) by mouth 3 (three) times daily. 270 tablet 2  . sertraline (ZOLOFT) 50 MG tablet Take 50 mg by mouth daily.      No current facility-administered medications on file prior to visit.     Allergies  Allergen Reactions  . Gluten Meal Diarrhea  . Itraconazole Diarrhea and Itching  . Lactose Intolerance (Gi) Diarrhea and Nausea Only    Possible allergy, per patient (??)    Pt patients past, family and social history were reviewed and updated.   Objective:  BP (!) 149/84   Pulse 72   Temp 97.7 F (36.5 C) (Oral)   Resp 18   Ht 5' 5.5" (1.664 m)   Wt 154 lb 12.8 oz (70.2 kg)  SpO2 94%   BMI 25.37 kg/m   Physical Exam  Constitutional: She is oriented to person, place, and time and well-developed, well-nourished, and in no distress.  HENT:  Head: Normocephalic and atraumatic.  Right Ear: External ear normal.  Left Ear: External ear normal.  Cardiovascular: Normal rate, regular rhythm and normal heart sounds.   No murmur heard. Pulmonary/Chest: Effort normal.  Abdominal: Soft. There is tenderness in the suprapubic area. There is no CVA tenderness.  Mild discomfort on the right lower quadrant  Neurological: She is alert and oriented to person, place, and time. Gait normal.  Skin: Skin is warm and dry.  Psychiatric: Mood, memory, affect and judgment normal.  Vitals reviewed.   Results for orders placed or performed in visit on 04/24/17    POCT urinalysis dipstick  Result Value Ref Range   Color, UA yellow yellow   Clarity, UA clear clear   Glucose, UA negative negative mg/dL   Bilirubin, UA negative negative   Ketones, POC UA negative negative mg/dL   Spec Grav, UA >=1.030 (A) 1.010 - 1.025   Blood, UA negative negative   pH, UA 5.0 5.0 - 8.0   Protein Ur, POC negative negative mg/dL   Urobilinogen, UA 0.2 0.2 or 1.0 E.U./dL   Nitrite, UA Negative Negative   Leukocytes, UA Negative Negative    Assessment and Plan :  Dark urine - Plan: POCT urinalysis dipstick, Urine culture concentrated urine - pt encouraged to increase her water intake - send urine for culture to make sure no urine - she will monitor the abd pain and if it gets worse RTC.  Windell Hummingbird PA-C  [facility] 04/24/2017 6:19 PM

## 2017-04-25 ENCOUNTER — Telehealth: Payer: Self-pay | Admitting: *Deleted

## 2017-04-25 LAB — URINE CULTURE

## 2017-04-25 NOTE — Telephone Encounter (Signed)
SPOKE TO PATIENT ABOUT GEN CHANGE INSTRUCTIONS ON 05-14-17 WITH DR. Lovena Le DUE TO DR Caryl Comes  BEING UNAVAILABLE THAT DAY.  DUE TO THE FACT OF WRITING THE WRONG DATE DOWN [address] [place] OF June INSTEAD OF 11TH OF June, PT STAYS SHE WOULD REALLY LIKE DR Caryl Comes SO SHE WILL CONTACT ME BACK WITH INFORMATION FROM FAMILY.

## 2017-04-26 ENCOUNTER — Encounter: Payer: Self-pay | Admitting: *Deleted

## 2017-04-26 ENCOUNTER — Telehealth: Payer: Self-pay | Admitting: *Deleted

## 2017-04-26 DIAGNOSIS — Z01818 Encounter for other preprocedural examination: Secondary | ICD-10-CM

## 2017-04-26 NOTE — Telephone Encounter (Signed)
PT IS AWARE OF NEW GEN CHANGE DATE WITH DR Caryl Comes  ON 05-07-17    1.ARRIVAL TIME OF 1130 AM   2. HOLD ELIQUIS THE DAY PRIOR AND THE MORNING OF PROCEDURE  3. RETURN FOR PRE PROCEDURAL LABS ON 05-01-2017   4. PT AWARE A LETTER WILL BE MAILED TO  THEIR HOME WITH THESE     INSTRUCTIONS ALSO

## 2017-05-01 ENCOUNTER — Other Ambulatory Visit (INDEPENDENT_AMBULATORY_CARE_PROVIDER_SITE_OTHER): Payer: Medicare Other

## 2017-05-01 DIAGNOSIS — Z01818 Encounter for other preprocedural examination: Secondary | ICD-10-CM

## 2017-05-01 DIAGNOSIS — N183 Chronic kidney disease, stage 3 (moderate): Secondary | ICD-10-CM | POA: Diagnosis not present

## 2017-05-01 DIAGNOSIS — I5022 Chronic systolic (congestive) heart failure: Secondary | ICD-10-CM | POA: Diagnosis not present

## 2017-05-01 DIAGNOSIS — J329 Chronic sinusitis, unspecified: Secondary | ICD-10-CM | POA: Diagnosis not present

## 2017-05-01 DIAGNOSIS — I480 Paroxysmal atrial fibrillation: Secondary | ICD-10-CM | POA: Diagnosis not present

## 2017-05-01 DIAGNOSIS — I493 Ventricular premature depolarization: Secondary | ICD-10-CM | POA: Diagnosis not present

## 2017-05-02 LAB — CBC
Hematocrit: 40.8 % (ref 34.0–46.6)
Hemoglobin: 13.6 g/dL (ref 11.1–15.9)
MCH: 33.5 pg — ABNORMAL HIGH (ref 26.6–33.0)
MCHC: 33.3 g/dL (ref 31.5–35.7)
MCV: 101 fL — ABNORMAL HIGH (ref 79–97)
Platelets: 131 10*3/uL — ABNORMAL LOW (ref 150–379)
RBC: 4.06 x10E6/uL (ref 3.77–5.28)
RDW: 14.6 % (ref 12.3–15.4)
WBC: 5.7 10*3/uL (ref 3.4–10.8)

## 2017-05-02 LAB — PROTIME-INR
INR: 1 (ref 0.8–1.2)
PROTHROMBIN TIME: 10.9 s (ref 9.1–12.0)

## 2017-05-02 LAB — BASIC METABOLIC PANEL
BUN / CREAT RATIO: 21 (ref 12–28)
BUN: 19 mg/dL (ref 8–27)
CHLORIDE: 107 mmol/L — AB (ref 96–106)
CO2: 23 mmol/L (ref 18–29)
Calcium: 9 mg/dL (ref 8.7–10.3)
Creatinine, Ser: 0.89 mg/dL (ref 0.57–1.00)
GFR calc non Af Amer: 61 mL/min/{1.73_m2} (ref >59)
GFR, EST AFRICAN AMERICAN: 70 mL/min/{1.73_m2} (ref >59)
GLUCOSE: 91 mg/dL (ref 65–99)
Potassium: 4.2 mmol/L (ref 3.5–5.2)
SODIUM: 145 mmol/L — AB (ref 134–144)

## 2017-05-07 ENCOUNTER — Encounter (HOSPITAL_COMMUNITY)
Admission: RE | Disposition: A | Discharge status: Home / Self Care | Payer: Self-pay | Source: Ambulatory Visit | Attending: Internal Medicine

## 2017-05-07 ENCOUNTER — Ambulatory Visit (HOSPITAL_COMMUNITY)
Admission: RE | Admit: 2017-05-07 | Discharge: 2017-05-07 | Disposition: A | Discharge status: Home / Self Care | Payer: Medicare Other | Source: Ambulatory Visit | Attending: Internal Medicine | Admitting: Internal Medicine

## 2017-05-07 DIAGNOSIS — Z950 Presence of cardiac pacemaker: Secondary | ICD-10-CM | POA: Diagnosis present

## 2017-05-07 DIAGNOSIS — Z86718 Personal history of other venous thrombosis and embolism: Secondary | ICD-10-CM | POA: Insufficient documentation

## 2017-05-07 DIAGNOSIS — Z87891 Personal history of nicotine dependence: Secondary | ICD-10-CM | POA: Insufficient documentation

## 2017-05-07 DIAGNOSIS — Z8673 Personal history of transient ischemic attack (TIA), and cerebral infarction without residual deficits: Secondary | ICD-10-CM | POA: Diagnosis not present

## 2017-05-07 DIAGNOSIS — F329 Major depressive disorder, single episode, unspecified: Secondary | ICD-10-CM | POA: Diagnosis not present

## 2017-05-07 DIAGNOSIS — I495 Sick sinus syndrome: Secondary | ICD-10-CM | POA: Insufficient documentation

## 2017-05-07 DIAGNOSIS — Z7901 Long term (current) use of anticoagulants: Secondary | ICD-10-CM | POA: Insufficient documentation

## 2017-05-07 DIAGNOSIS — Z45010 Encounter for checking and testing of cardiac pacemaker pulse generator [battery]: Secondary | ICD-10-CM | POA: Insufficient documentation

## 2017-05-07 DIAGNOSIS — I340 Nonrheumatic mitral (valve) insufficiency: Secondary | ICD-10-CM | POA: Diagnosis not present

## 2017-05-07 DIAGNOSIS — I480 Paroxysmal atrial fibrillation: Secondary | ICD-10-CM | POA: Diagnosis not present

## 2017-05-07 DIAGNOSIS — I10 Essential (primary) hypertension: Secondary | ICD-10-CM | POA: Diagnosis not present

## 2017-05-07 HISTORY — PX: PPM GENERATOR CHANGEOUT: EP1233

## 2017-05-07 LAB — SURGICAL PCR SCREEN
MRSA, PCR: NEGATIVE
Staphylococcus aureus: NEGATIVE

## 2017-05-07 SURGERY — PPM GENERATOR CHANGEOUT

## 2017-05-07 MED ORDER — FENTANYL CITRATE (PF) 100 MCG/2ML IJ SOLN
INTRAMUSCULAR | Status: AC
  Filled 2017-05-07: qty 2

## 2017-05-07 MED ORDER — MIDAZOLAM HCL 5 MG/5ML IJ SOLN
INTRAMUSCULAR | Status: AC
  Filled 2017-05-07: qty 5

## 2017-05-07 MED ORDER — MIDAZOLAM HCL 5 MG/5ML IJ SOLN
INTRAMUSCULAR | Status: DC | PRN
  Administered 2017-05-07: 1 mg via INTRAVENOUS
  Administered 2017-05-07: 2 mg via INTRAVENOUS

## 2017-05-07 MED ORDER — CHLORHEXIDINE GLUCONATE 4 % EX LIQD
60.0000 mL | Freq: Once | CUTANEOUS | Status: DC
  Filled 2017-05-07: qty 60

## 2017-05-07 MED ORDER — LIDOCAINE HCL (PF) 1 % IJ SOLN
INTRAMUSCULAR | Status: AC
  Filled 2017-05-07: qty 60

## 2017-05-07 MED ORDER — ONDANSETRON HCL 4 MG/2ML IJ SOLN: 4.0000 mg | Freq: Four times a day (QID) | INTRAMUSCULAR | Status: DC | PRN

## 2017-05-07 MED ORDER — ACETAMINOPHEN 325 MG PO TABS: 325.0000 mg | ORAL_TABLET | ORAL | Status: DC | PRN

## 2017-05-07 MED ORDER — LIDOCAINE HCL (PF) 1 % IJ SOLN
INTRAMUSCULAR | Status: DC | PRN
  Administered 2017-05-07: 45 mL

## 2017-05-07 MED ORDER — CEFAZOLIN SODIUM-DEXTROSE 2-4 GM/100ML-% IV SOLN
INTRAVENOUS | Status: AC
  Filled 2017-05-07: qty 100

## 2017-05-07 MED ORDER — CEFAZOLIN SODIUM-DEXTROSE 2-4 GM/100ML-% IV SOLN
2.0000 g | INTRAVENOUS | Status: AC
  Administered 2017-05-07: 2 g via INTRAVENOUS
  Filled 2017-05-07: qty 100

## 2017-05-07 MED ORDER — SODIUM CHLORIDE 0.9 % IR SOLN
80.0000 mg | Status: AC
  Administered 2017-05-07: 80 mg
  Filled 2017-05-07: qty 2

## 2017-05-07 MED ORDER — MUPIROCIN 2 % EX OINT
TOPICAL_OINTMENT | CUTANEOUS | Status: AC
  Administered 2017-05-07: 13:00:00
  Filled 2017-05-07: qty 22

## 2017-05-07 MED ORDER — FENTANYL CITRATE (PF) 100 MCG/2ML IJ SOLN
INTRAMUSCULAR | Status: DC | PRN
  Administered 2017-05-07 (×2): 25 ug via INTRAVENOUS

## 2017-05-07 MED ORDER — SODIUM CHLORIDE 0.9 % IV SOLN
INTRAVENOUS | Status: DC
  Administered 2017-05-07: 13:00:00 via INTRAVENOUS

## 2017-05-07 MED ORDER — SODIUM CHLORIDE 0.9 % IR SOLN
Status: AC
  Filled 2017-05-07: qty 2

## 2017-05-07 MED ORDER — SODIUM CHLORIDE 0.9 % IV SOLN: INTRAVENOUS | Status: AC

## 2017-05-07 SURGICAL SUPPLY — 6 items
CABLE SURGICAL S-101-97-12 (CABLE) ×2 IMPLANT
HEMOSTAT SURGICEL 2X4 FIBR (HEMOSTASIS) ×2 IMPLANT
IPG PACE AZUR XT DR MRI W1DR01 (Pacemaker) IMPLANT
PACE AZURE XT DR MRI W1DR01 (Pacemaker) ×3 IMPLANT
PAD DEFIB LIFELINK (PAD) ×2 IMPLANT
TRAY PACEMAKER INSERTION (PACKS) ×2 IMPLANT

## 2017-05-07 NOTE — Interval H&P Note (Signed)
History and Physical Interval Note:  05/07/2017 12:40 PM  [name] [name]  has presented today for surgery, with the diagnosis of eri  The various methods of treatment have been discussed with the patient and family. After consideration of risks, benefits and other options for treatment, the patient has consented to  Procedure(s): PPM Generator Changeout (N/A) as a surgical intervention .  The patient's history has been reviewed, patient examined, no change in status, stable for surgery.  I have reviewed the patient's chart and labs.  Questions were answered to the patient's satisfaction.     Virl Axe We have reviewed the benefits and risks of generator replacement.  These include but are not limited to lead fracture and infection.  The patient understands, agrees and is willing to proceed.     Prior stroke on apixoban

## 2017-05-07 NOTE — Discharge Instructions (Signed)

## 2017-05-07 NOTE — H&P (View-Only) (Signed)
Cardiology Office Note Date:  04/18/2017  Patient ID:  Adaleah, Forget Aug 10, 1932, MRN [id number] PCP:  Lajean Manes, MD  Cardiologist:  Dr. Johnsie Cancel Electrophysiologist: Dr. Caryl Comes    Chief Complaint: PPM at ERI  History of Present Illness: SHOMARI MATUSIK is a 83 y.o. female with history of PAFib, tcahy-brady w/PPM, HTN, CVA, DVT comes in today to be seen for Dr. Caryl Comes.  She was last seen by him in Feb 2017 .  At that time from an AF standpoint doing well with the Rythmol, though mentioned another mechanical fall.  At that visit she was changed from warfarin to Eliquis.  She was more recently seen by myself in November, was feeling well.    She says yes to question of CP, but has correlated it only at night after having heavier meals, none otherwise.  No palpitations, no SOB, no exertional intolerances.  She reports her last fall was in June, was on an escalator in [place] says she had several bags had adjusted her load to try and even them out but lost her balance and fel backwards down a few steps.  No LOC.  She was brought via EMS to the hospital says "they scanned everything" and had no injuries.  She reports historically had fallen fairly frequently, all with an explanation, never syncope.  She feels though that in general she is stronger and her last fall as mentioned several months ago now.  She denies any bleeding or signs of bleeding.  ABout a month ago she felt like her heart beat was different, made he feel like she did prior to her pacer, just didn't feel well, maybe even lightheaded, she started taking her 30mg  cardizem tablet sBID and this seemed to resolve it.  She did come in and her device was reprogrammed from VVI (at [facility]) back to AAIR<>DDDR.  She denies any further falls and generally feels like she is stronger with a stronger more steady gait.  No CP, or SOB, no syncope.  AFib Hx Intolerant of metoprolol (unclear why) Intolerant of CCB with edema AAD w/  Rythmol DATE QRSduration Dose  2/12 128 0  9/16 128 225     2/17                                          126                                                      225    11/17                                         132                                                      225 TID         Device information: MDT dual chamber PPM, implanted 09/14/05, Dr. Lovena Le (follows w/ Dr. Caryl Comes)   Past Medical  History:  Diagnosis Date  . Atrial fibrillation ([place])   . CVA    Lacunar and cerebellar infarct  . Depression   . DVT (deep venous thrombosis) ([place])    Details not available in the chart  . Edema   . Mitral valve regurgitation    a. echo 2009 mild to moderate MR  . Sinoatrial node dysfunction ([facility])   . Unspecified essential hypertension     Past Surgical History:  Procedure Laterality Date  . PACEMAKER INSERTION  09-14-2005   MDT EnRhythm dual chamber pacemaker implanted by Dr Lovena Le for SND    Current Outpatient Prescriptions  Medication Sig Dispense Refill  . acetaminophen (TYLENOL) 650 MG CR tablet Take 650 mg by mouth every 8 (eight) hours as needed for pain.    Marland Kitchen allopurinol (ZYLOPRIM) 100 MG tablet Take 100 mg by mouth daily.    Marland Kitchen apixaban (ELIQUIS) 5 MG TABS tablet Take 5 mg by mouth 2 (two) times daily.    Marland Kitchen Apoaequorin (PREVAGEN) 10 MG CAPS Take 1 capsule by mouth daily.     . Biotin (BIOTIN 5000) 5 MG CAPS Take 5 mg by mouth daily.    . Cholecalciferol (VITAMIN D3) 2000 UNITS TABS Take 2,000 Units by mouth daily.     . Cyanocobalamin (VITAMIN B-12 PO) Take 1 tablet by mouth daily.     Marland Kitchen diltiazem (CARDIZEM) 30 MG tablet Take 1 tablet (30 mg total) by mouth daily as needed. FOR PALPITATIONS 45 tablet 6  . folic acid (FOLVITE) 1 MG tablet Take 1 mg by mouth daily.     Marland Kitchen LORazepam (ATIVAN) 0.5 MG tablet Take 0.5 mg by mouth at bedtime as needed for sleep.     . propafenone (RYTHMOL) 225 MG tablet Take 1 tablet (225 mg total) by mouth 3 (three) times daily. 270 tablet 2  .  sertraline (ZOLOFT) 50 MG tablet Take 50 mg by mouth daily.      No current facility-administered medications for this visit.     Allergies:   Itraconazole and Lactose intolerance (gi)   Social History:  The patient  reports that she quit smoking about 32 years ago. She has never used smokeless tobacco. She reports that she drinks about 8.4 oz of alcohol per week . She reports that she does not use drugs.   Family History:  The patient's family history includes Cancer in her mother; Coronary artery disease in her sister and sister; Diabetes in her father; Hypertension in her father; Mitral valve prolapse in her mother; Stroke in her brother.  ROS:  Please see the history of present illness.  All other systems are reviewed and otherwise negative.   PHYSICAL EXAM:  VS:  There were no vitals taken for this visit. BMI: There is no height or weight on file to calculate BMI. Well nourished, well developed, in no acute distress  HEENT: normocephalic, atraumatic  Neck: no JVD, carotid bruits or masses Cardiac: RRR; no significant murmurs, no rubs, or gallops Lungs:  CTA b/l, no wheezing, rhonchi or rales  Abd: soft, nontender MS: no deformity age appropriate atrophy Ext: no edema, chronic looking skin changes, excellent pedal pulse b/l Skin: warm and dry, no rash Neuro:  No gross deficits appreciated Psych: euthymic mood, full affect  PPM site is stable, no tethering or discomfort   EKG:  Done today and reviewed by myself is A paced, RBBB,QTc 489, QRS 132, PR 194 PPM interrogation done today by industry and reviewed by myself: battery reached  ERI 03/20/17, lead measurements are stable no arrhythmias, AP 44%, V paced <1%  06/30/14: TTE - Left ventricle: The cavity size was normal. Systolic function was normal. The estimated ejection fraction was in the range of 50% to 55%. Wall motion was normal; there were no regional wall motion abnormalities. Left ventricular diastolic  function parameters were normal. - Aortic valve: Trileaflet; mildly thickened, mildly calcified leaflets. There was no significant regurgitation. - Aortic root: The aortic root was normal in size. - Mitral valve: Calcified annulus. Mildly thickened leaflets . There was mild regurgitation. - Left atrium: The atrium was at the upper limits of normal in size. - Right ventricle: Systolic function was normal. - Right atrium: The atrium was normal in size. - Tricuspid valve: There was moderate regurgitation. - Pulmonary arteries: Systolic pressure was mildly increased. PA peak pressure: 42 mm Hg (S) Impressions - Normal biventricular size and function. Mild pulmonary hypertension.    Recent Labs: 09/14/2016: BUN 22; Creat 0.93; Hemoglobin 13.8; Platelets 134; Potassium 4.0; Sodium 144  No results found for requested labs within last 8760 hours.   CrCl cannot be calculated (Patient's most recent lab result is older than the maximum 21 days allowed.).   Wt Readings from Last 3 Encounters:  02/12/17 156 lb 1.9 oz (70.8 kg)  10/26/16 157 lb (71.2 kg)  06/07/16 150 lb (68 kg)     Other studies reviewed: Additional studies/records reviewed today include: summarized above  ASSESSMENT AND PLAN:  1. PAFib    CHA2DS2Vasc is 6 on Eliquis  2. Tachy-brady w/PPM     Battery has reached ERI     Discussed with the patient generator change procedure and its risks/benifts, she woiuld like to proceed  3. HTN     Stable, no changes   Disposition: Dr. Olin Pia calendar is not available this afternoon, CMA will call the patient tomorrow to schedule, the patient would like to proceed soon, labs will be drawn, I instructed her not to take her Eliquis the day prior or the morning of her procedure, and will plan for routine post procedure follow up.  Current medicines are reviewed at length with the patient today.  The patient did not have any concerns regarding  medicines.  Haywood Lasso, PA-C 04/18/2017 5:21 AM     [facility] [address] [place] [address] [phone] (office)  [phone] (fax)

## 2017-05-08 ENCOUNTER — Encounter (HOSPITAL_COMMUNITY): Payer: Self-pay | Admitting: Internal Medicine

## 2017-05-21 ENCOUNTER — Ambulatory Visit (INDEPENDENT_AMBULATORY_CARE_PROVIDER_SITE_OTHER): Payer: Medicare Other | Admitting: *Deleted

## 2017-05-21 DIAGNOSIS — I495 Sick sinus syndrome: Secondary | ICD-10-CM | POA: Diagnosis not present

## 2017-05-22 LAB — CUP PACEART INCLINIC DEVICE CHECK
Battery Remaining Longevity: 178 mo
Battery Voltage: 3.21 V
Brady Statistic AP VP Percent: 0.05 %
Brady Statistic RA Percent Paced: 45.39 %
Brady Statistic RV Percent Paced: 0.07 %
Date Time Interrogation Session: 20180625134522
Implantable Lead Implant Date: 20061019
Implantable Lead Location: 753860
Implantable Lead Model: 5076
Implantable Pulse Generator Implant Date: 20180611
Lead Channel Impedance Value: 342 Ohm
Lead Channel Impedance Value: 437 Ohm
Lead Channel Impedance Value: 703 Ohm
Lead Channel Pacing Threshold Amplitude: 0.5 V
Lead Channel Pacing Threshold Amplitude: 0.75 V
Lead Channel Pacing Threshold Pulse Width: 0.4 ms
Lead Channel Setting Pacing Amplitude: 1.5 V
Lead Channel Setting Sensing Sensitivity: 0.9 mV
MDC IDC LEAD IMPLANT DT: 20061019
MDC IDC LEAD LOCATION: 753859
MDC IDC MSMT LEADCHNL RA IMPEDANCE VALUE: 608 Ohm
MDC IDC MSMT LEADCHNL RA SENSING INTR AMPL: 1.875 mV
MDC IDC MSMT LEADCHNL RA SENSING INTR AMPL: 2 mV
MDC IDC MSMT LEADCHNL RV PACING THRESHOLD PULSEWIDTH: 0.4 ms
MDC IDC MSMT LEADCHNL RV SENSING INTR AMPL: 2.875 mV
MDC IDC MSMT LEADCHNL RV SENSING INTR AMPL: 4.375 mV
MDC IDC SET LEADCHNL RV PACING AMPLITUDE: 2.5 V
MDC IDC SET LEADCHNL RV PACING PULSEWIDTH: 0.4 ms
MDC IDC STAT BRADY AP VS PERCENT: 45.22 %
MDC IDC STAT BRADY AS VP PERCENT: 0.02 %
MDC IDC STAT BRADY AS VS PERCENT: 54.71 %

## 2017-05-22 NOTE — Progress Notes (Signed)
Wound check appointment. Dermabond removed. Wound without redness or edema. Incision edges approximated, wound well healed. Normal device function. Thresholds, sensing, and impedances consistent with implant measurements. Device programmed at appropriate safety margins. Histogram distribution appropriate for patient and level of activity. No mode switches or high ventricular rates noted. Patient educated about wound care and arm mobility. ROV in 3 months with SK.

## 2017-05-23 DIAGNOSIS — H26493 Other secondary cataract, bilateral: Secondary | ICD-10-CM | POA: Diagnosis not present

## 2017-05-25 DIAGNOSIS — E7800 Pure hypercholesterolemia, unspecified: Secondary | ICD-10-CM | POA: Diagnosis not present

## 2017-05-25 DIAGNOSIS — N183 Chronic kidney disease, stage 3 (moderate): Secondary | ICD-10-CM | POA: Diagnosis not present

## 2017-05-25 DIAGNOSIS — D696 Thrombocytopenia, unspecified: Secondary | ICD-10-CM | POA: Diagnosis not present

## 2017-05-25 DIAGNOSIS — J449 Chronic obstructive pulmonary disease, unspecified: Secondary | ICD-10-CM | POA: Diagnosis not present

## 2017-05-25 DIAGNOSIS — I480 Paroxysmal atrial fibrillation: Secondary | ICD-10-CM | POA: Diagnosis not present

## 2017-05-25 DIAGNOSIS — Z79899 Other long term (current) drug therapy: Secondary | ICD-10-CM | POA: Diagnosis not present

## 2017-05-31 DIAGNOSIS — D649 Anemia, unspecified: Secondary | ICD-10-CM | POA: Diagnosis not present

## 2017-05-31 DIAGNOSIS — Z79899 Other long term (current) drug therapy: Secondary | ICD-10-CM | POA: Diagnosis not present

## 2017-06-12 DIAGNOSIS — D649 Anemia, unspecified: Secondary | ICD-10-CM | POA: Diagnosis not present

## 2017-08-08 ENCOUNTER — Other Ambulatory Visit: Payer: Self-pay | Admitting: *Deleted

## 2017-08-08 MED ORDER — APIXABAN 5 MG PO TABS: 5.0000 mg | ORAL_TABLET | Freq: Two times a day (BID) | ORAL | 10 refills | Status: DC

## 2017-08-09 ENCOUNTER — Ambulatory Visit (INDEPENDENT_AMBULATORY_CARE_PROVIDER_SITE_OTHER): Payer: Medicare Other | Admitting: Internal Medicine

## 2017-08-09 ENCOUNTER — Encounter: Payer: Self-pay | Admitting: Internal Medicine

## 2017-08-09 VITALS — BP 125/84 | HR 85 | Ht 65.0 in | Wt 158.0 lb

## 2017-08-09 DIAGNOSIS — Z950 Presence of cardiac pacemaker: Secondary | ICD-10-CM | POA: Diagnosis not present

## 2017-08-09 DIAGNOSIS — I480 Paroxysmal atrial fibrillation: Secondary | ICD-10-CM

## 2017-08-09 DIAGNOSIS — I495 Sick sinus syndrome: Secondary | ICD-10-CM | POA: Diagnosis not present

## 2017-08-09 NOTE — Progress Notes (Signed)
HPI  [name] [name] is a 83 y.o. 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 Underwent generator replacement 5/18  Device interrogation demonstrated paroxysms of atrial fibrillation with ventricular rates into the 180s 190s.  She was started on low-dose beta blockers. Previously she did not tolerate metoprolol succinate.  She was also switched and calcium blockers which have been associated with some peripheral edema. Her atrial fibrillation has been much better since its initiation.  At the last visit we discontinued the calcium blockers because of the edema and started her on propafenone.  She has hx of recurrent falls   Following generator replacement she has had problems with pain at the device site. There has been no erythema.  Denies shortness of breath or chest discomfort.  Echocardiogram 8/15 demonstrated normal LV function   She is on apixoban ; no bleeding issues.  DATE QRSduration Dose  2/12 128 0  9/16 128 225  2/17 126 225     Date Cr Hgb  6/18 0.89 13.6           Past Medical History:  Diagnosis Date  . Atrial fibrillation ([place])   . CVA    Lacunar and cerebellar infarct  . Depression   . DVT (deep venous thrombosis) ([place])    Details not available in the chart  . Edema   . Mitral valve regurgitation    a. echo 2009 mild to moderate MR  . Sinoatrial node dysfunction ([facility])   . Unspecified essential hypertension     Past Surgical History:  Procedure Laterality Date  . PACEMAKER INSERTION  09-14-2005   MDT EnRhythm dual chamber pacemaker implanted by Dr Lovena Le for SND  . PPM GENERATOR CHANGEOUT N/A 05/07/2017   Procedure: PPM Generator Changeout;  Surgeon: Deboraha Sprang, MD;  Location: [facility];  Service: Cardiovascular;  Laterality: N/A;    Current Outpatient Prescriptions  Medication Sig Dispense Refill  . acetaminophen  (TYLENOL) 650 MG CR tablet Take 650 mg by mouth every 8 (eight) hours as needed for pain.    Marland Kitchen allopurinol (ZYLOPRIM) 100 MG tablet Take 100 mg by mouth daily.    Marland Kitchen apixaban (ELIQUIS) 5 MG TABS tablet Take 1 tablet (5 mg total) by mouth 2 (two) times daily. 60 tablet 10  . Apoaequorin (PREVAGEN) 10 MG CAPS Take 10 mg by mouth daily.     . Biotin (BIOTIN 5000) 5 MG CAPS Take 5 mg by mouth daily.    . Cholecalciferol (VITAMIN D3) 2000 UNITS TABS Take 2,000 Units by mouth daily.     Marland Kitchen desonide (DESOWEN) 0.05 % ointment Apply 1 application topically 2 (two) times daily as needed (dry skin).    Marland Kitchen diltiazem (CARDIZEM) 30 MG tablet Take 1 tablet (30 mg total) by mouth daily as needed. FOR PALPITATIONS (Patient taking differently: Take 30-60 mg by mouth daily as needed (for palpitations). ) 45 tablet 6  . doxycycline (VIBRAMYCIN) 100 MG capsule Take 100 mg by mouth 2 (two) times daily.    . folic acid (FOLVITE) 1 MG tablet Take 1 mg by mouth once a week.     Marland Kitchen LORazepam (ATIVAN) 0.5 MG tablet Take 0.5 mg by mouth at bedtime as needed for sleep.     . Polyvinyl Alcohol-Povidone (REFRESH OP) Apply 1 drop to eye 2 (two) times daily as needed (dry eyes).    . propafenone (RYTHMOL) 225 MG tablet  Take 1 tablet (225 mg total) by mouth 3 (three) times daily. 270 tablet 2  . sertraline (ZOLOFT) 50 MG tablet Take 50 mg by mouth daily.     . sodium chloride (OCEAN) 0.65 % SOLN nasal spray Place 1 spray into both nostrils as needed for congestion.     No current facility-administered medications for this visit.     Allergies  Allergen Reactions  . Gluten Meal Diarrhea  . Itraconazole Diarrhea and Itching  . Lactose Intolerance (Gi) Diarrhea and Nausea Only    Pt unsure    Review of Systems negative except from HPI and PMH  Physical Exam BP 125/84   Pulse 85   Ht 5\' 5"  (1.651 m)   Wt 158 lb (71.7 kg)   SpO2 95%   BMI 26.29 kg/m  Well developed and nourished in no acute distress HENT normal Neck  supple with JVP-flat Clear Device pocket well healed; without hematoma or erythema.  There is no tethering tender  Regular rate and rhythm, no murmurs or gallops Abd-soft with active BS No Clubbing cyanosis edema Skin-warm and dry A & Oriented  Grossly normal sensory and motor function  ECG Atrial pacing 71 14/12/44 RBBB  Assessment and  Plan  Atrial fibrillation-paroxysmal  Hypertension   Sinus node dysfunction   Pacemaker site pain   Pacemaker-Medtronic The patient's device was interrogated.  The information was reviewed. No changes were made in the programming.    Falls  No intercurrent atrial fibrillation or flutter  On Anticoagulation;  No bleeding issues   I worry with ongoing pain re pacer site about low grade infection Will see in 3 months  BP well controlled

## 2017-08-09 NOTE — Patient Instructions (Addendum)
Medication Instructions:  Your physician recommends that you continue on your current medications as directed. Please refer to the Current Medication list given to you today.   Labwork: None ordered  Testing/Procedures: None ordered  Follow-Up: Your physician wants you to follow-up in: 3 months with Dr. Caryl Comes.    Any Other Special Instructions Will Be Listed Below (If Applicable).     If you need a refill on your cardiac medications before your next appointment, please call your pharmacy.

## 2017-09-11 NOTE — Progress Notes (Signed)
Patient ID: [name] [name], female   DOB: 07-Aug-1932, 83 y.o.   MRN: [id number]  HPI  [name] [name] is a 83 y.o. 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 2006 with generator change in June 2018  Burden of PAF less on propofenone. She has had some edema with calcium blockers and did not tolerate Metoprolol in past   Echocardiogram 8/15 demonstrated normal LV function  She is now on Eliquis 5 bid for anticoagulation   She had generator change 05/07/17. Medtronic. Seen by SK September and some concern for Low grade infection and persistent pain at pocket sight   She wants a flu shot today   Past Medical History:  Diagnosis Date  . Atrial fibrillation ([place])   . CVA    Lacunar and cerebellar infarct  . Depression   . DVT (deep venous thrombosis) ([place])    Details not available in the chart  . Edema   . Mitral valve regurgitation    a. echo 2009 mild to moderate MR  . Sinoatrial node dysfunction ([facility])   . Unspecified essential hypertension     Past Surgical History:  Procedure Laterality Date  . PACEMAKER INSERTION  09-14-2005   MDT EnRhythm dual chamber pacemaker implanted by Dr Lovena Le for SND  . PPM GENERATOR CHANGEOUT N/A 05/07/2017   Procedure: PPM Generator Changeout;  Surgeon: Deboraha Sprang, MD;  Location: [facility];  Service: Cardiovascular;  Laterality: N/A;    Current Outpatient Prescriptions  Medication Sig Dispense Refill  . acetaminophen (TYLENOL) 650 MG CR tablet Take 650 mg by mouth every 8 (eight) hours as needed for pain.    Marland Kitchen allopurinol (ZYLOPRIM) 100 MG tablet Take 100 mg by mouth daily.    Marland Kitchen apixaban (ELIQUIS) 5 MG TABS tablet Take 1 tablet (5 mg total) by mouth 2 (two) times daily. 60 tablet 10  . Apoaequorin (PREVAGEN) 10 MG CAPS Take 10 mg by mouth daily.     . Biotin (BIOTIN 5000) 5 MG CAPS Take 5 mg by mouth daily.     . Cholecalciferol (VITAMIN D3) 2000 UNITS TABS Take 2,000 Units by mouth daily.     Marland Kitchen desonide (DESOWEN) 0.05 % ointment Apply 1 application topically 2 (two) times daily as needed (dry skin).    Marland Kitchen diltiazem (CARDIZEM) 30 MG tablet Take 1 tablet (30 mg total) by mouth daily as needed. FOR PALPITATIONS (Patient taking differently: Take 30-60 mg by mouth daily as needed (for palpitations). ) 45 tablet 6  . doxycycline (VIBRAMYCIN) 100 MG capsule Take 100 mg by mouth 2 (two) times daily.    . folic acid (FOLVITE) 1 MG tablet Take 1 mg by mouth once a week.     Marland Kitchen LORazepam (ATIVAN) 0.5 MG tablet Take 0.5 mg by mouth at bedtime as needed for sleep.     . Polyvinyl Alcohol-Povidone (REFRESH OP) Apply 1 drop to eye 2 (two) times daily as needed (dry eyes).    . propafenone (RYTHMOL) 225 MG tablet Take 1 tablet (225 mg total) by mouth 3 (three) times daily. 270 tablet 2  . sertraline (ZOLOFT) 50 MG tablet Take 50 mg by mouth daily.     . sodium chloride (OCEAN) 0.65 % SOLN nasal spray Place 1 spray into both nostrils as needed for congestion.     No current facility-administered medications for this visit.     Allergies  Allergen  Reactions  . Gluten Meal Diarrhea  . Itraconazole Diarrhea and Itching  . Lactose Intolerance (Gi) Diarrhea and Nausea Only    Pt unsure    Review of Systems negative except from HPI and PMH  Physical Exam BP 126/72   Pulse 86   Ht 5\' 5"  (1.651 m)   Wt 158 lb (71.7 kg)   SpO2 98%   BMI 26.29 kg/m  Well developed and well nourished in no acute distress HENT normal with some left facial bruising  E scleral and icterus clear Neck Supple JVP flat; carotids brisk and full Clear to ausculation Regular rate and rhythm, no murmurs gallops or rub Soft with active bowel sounds No clubbing cyanosis  edema Alert and oriented, grossly normal motor and sensory function Skin Warm and Dry  ECG demonstrates sinus rhythm at 61 Intervals 17/13/44  right bundle branch  block   08/26/15  SR rate 63 a paced  RBBB    Assessment and  Plan  Atrial fibrillation-paroxysmal :  Continue propofenone and eliquis Good suppression on device interrogation  Hypertension :  Well controlled.  Continue current medications and low sodium Dash type diet.    Elevated TSH:  Normal now labs with Dr Loyal Buba The patient's device was interrogated. Normal function   F/U Dr Caryl Comes regarding Possible low grade infection  RBBB:  Chronic no change in ECG morphology unless paced   Primary: flu shot given today   Jenkins Rouge

## 2017-09-19 ENCOUNTER — Ambulatory Visit (INDEPENDENT_AMBULATORY_CARE_PROVIDER_SITE_OTHER): Payer: Medicare Other | Admitting: Cardiovascular Disease

## 2017-09-19 ENCOUNTER — Encounter: Payer: Self-pay | Admitting: Cardiovascular Disease

## 2017-09-19 VITALS — BP 126/72 | HR 86 | Ht 65.0 in | Wt 158.0 lb

## 2017-09-19 DIAGNOSIS — I480 Paroxysmal atrial fibrillation: Secondary | ICD-10-CM | POA: Diagnosis not present

## 2017-09-19 DIAGNOSIS — Z23 Encounter for immunization: Secondary | ICD-10-CM | POA: Diagnosis not present

## 2017-09-19 NOTE — Patient Instructions (Addendum)

## 2017-10-22 ENCOUNTER — Ambulatory Visit: Payer: Self-pay

## 2017-10-22 NOTE — Telephone Encounter (Signed)
  Reason for Disposition . Side (flank) or back pain present  Answer Assessment - Initial Assessment Questions 1. COLOR of URINE: "Describe the color of the urine."  (e.g., tea-colored, pink, red, blood clots, bloody)     Wine - colored 2. ONSET: "When did the bleeding start?"      Last night 3. EPISODES: "How many times has there been blood in the urine?" or "How many times today?"     2 - 3 times 4. PAIN with URINATION: "Is there any pain with passing your urine?" If so, ask: "How bad is the pain?"  (Scale 1-10; or mild, moderate, severe)    - MILD - complains slightly about urination hurting    - MODERATE - interferes with normal activities      - SEVERE - excruciating, unwilling or unable to urinate because of the pain      Just a little 5. FEVER: "Do you have a fever?" If so, ask: "What is your temperature, how was it measured, and when did it start?"     No fever 6. ASSOCIATED SYMPTOMS: "Are you passing urine more frequently than usual?"     No 7. OTHER SYMPTOMS: "Do you have any other symptoms?" (e.g., back/flank pain, abdominal pain, vomiting)     Pain right lower quadrant and nausea 8. PREGNANCY: "Is there any chance you are pregnant?" "When was your last menstrual period?"     No  Protocols used: URINE - BLOOD IN-A-AH [place] the beach [place]. Appointment made for 10/23/17.

## 2017-10-23 ENCOUNTER — Ambulatory Visit: Payer: Self-pay | Admitting: Emergency Medicine

## 2017-10-23 ENCOUNTER — Ambulatory Visit (HOSPITAL_COMMUNITY)
Admission: EM | Admit: 2017-10-23 | Discharge: 2017-10-23 | Disposition: A | Discharge status: Home / Self Care | Payer: Medicare Other | Attending: Family Medicine | Admitting: Family Medicine

## 2017-10-23 ENCOUNTER — Encounter (HOSPITAL_COMMUNITY): Payer: Self-pay | Admitting: Family Medicine

## 2017-10-23 DIAGNOSIS — R1031 Right lower quadrant pain: Secondary | ICD-10-CM

## 2017-10-23 DIAGNOSIS — R310 Gross hematuria: Secondary | ICD-10-CM

## 2017-10-23 NOTE — ED Triage Notes (Signed)
Pt here for RLQ pain with hematuria since 2 days ago. Reports the pain stopped once she used the bathroom yesterday.

## 2017-10-23 NOTE — Discharge Instructions (Signed)
Please go home and collect urine. Please return to [place] today. We will call you if anything comes back on your urine concerning for infection or stones.   Since your pain is improved, you do not have a fever; whatever you experienced has likely passed.  Return if you start to experience your abdominal pain again, lightheaded, dizziness or significant blood loss.

## 2017-10-23 NOTE — ED Notes (Signed)
Pt went home to provide urine sample and is bringing back.

## 2017-10-23 NOTE — ED Provider Notes (Signed)
[place]    CSN: [id number] Arrival date & time: 10/23/17  1034     History   Chief Complaint Chief Complaint  Patient presents with  . Abdominal Pain    HPI [name] [name] is a 83 y.o. female with history of Afib on Eliquis presenting with RLQ pain and hematuria. She describes very intense pain Sunday night that was accompanied with vomiting. Pain relieved Monday morning after urinating. She noticed her urine was wine colored. Throughout the rest of the day her urine was normal yellow with some wine colored sediment. Pain and nausea dissipated. Today she was planning on following up with her PCP, but was late to her appointment so she came here instead. History of about 30 pack years, quit 33 years ago. History of cystitis in the past. No dysuria, frequency or urgency.   HPI  Past Medical History:  Diagnosis Date  . Atrial fibrillation ([place])   . CVA    Lacunar and cerebellar infarct  . Depression   . DVT (deep venous thrombosis) ([place])    Details not available in the chart  . Edema   . Mitral valve regurgitation    a. echo 2009 mild to moderate MR  . Sinoatrial node dysfunction ([facility])   . Unspecified essential hypertension     Patient Active Problem List   Diagnosis Date Noted  . PAF (paroxysmal atrial fibrillation) ([place]) 06/29/2014  . SVT (supraventricular tachycardia) ([place]) 06/29/2014  . Frequent falls 06/29/2014  . Chronic anticoagulation 06/29/2014  . Encounter for therapeutic drug monitoring 12/31/2013  . Edema 10/21/2013  . Hair thinning 09/26/2012  . Mixed hyperlipidemia 02/15/2010  . PACEMAKER-Medtronic 08/12/2009  . MITRAL INSUFFICIENCY 02/17/2009  . Essential hypertension 02/17/2009  . SICK SINUS SYNDROME 02/17/2009  . DVT 02/17/2009    Past Surgical History:  Procedure Laterality Date  . PACEMAKER INSERTION  09-14-2005   MDT EnRhythm dual chamber pacemaker implanted by Dr Lovena Le for SND  . PPM GENERATOR CHANGEOUT N/A 05/07/2017     Procedure: PPM Generator Changeout;  Surgeon: Deboraha Sprang, MD;  Location: [facility];  Service: Cardiovascular;  Laterality: N/A;    OB History    No data available       Home Medications    Prior to Admission medications   Medication Sig Start Date End Date Taking? Authorizing Provider  acetaminophen (TYLENOL) 650 MG CR tablet Take 650 mg by mouth every 8 (eight) hours as needed for pain.    [provider]  allopurinol (ZYLOPRIM) 100 MG tablet Take 100 mg by mouth daily.    [provider]  apixaban (ELIQUIS) 5 MG TABS tablet Take 1 tablet (5 mg total) by mouth 2 (two) times daily. 08/08/17   Josue Hector, MD  Apoaequorin (PREVAGEN) 10 MG CAPS Take 10 mg by mouth daily.     [provider]  Biotin (BIOTIN 5000) 5 MG CAPS Take 5 mg by mouth daily.    [provider]  Cholecalciferol (VITAMIN D3) 2000 UNITS TABS Take 2,000 Units by mouth daily.     [provider]  desonide (DESOWEN) 0.05 % ointment Apply 1 application topically 2 (two) times daily as needed (dry skin).    [provider]  diltiazem (CARDIZEM) 30 MG tablet Take 1 tablet (30 mg total) by mouth daily as needed. FOR PALPITATIONS Patient taking differently: Take 30-60 mg by mouth daily as needed (for palpitations).  03/28/17   Josue Hector, MD  doxycycline (VIBRAMYCIN)  100 MG capsule Take 100 mg by mouth 2 (two) times daily.    [provider]  folic acid (FOLVITE) 1 MG tablet Take 1 mg by mouth once a week.     [provider]  LORazepam (ATIVAN) 0.5 MG tablet Take 0.5 mg by mouth at bedtime as needed for sleep.     [provider]  Polyvinyl Alcohol-Povidone (REFRESH OP) Apply 1 drop to eye 2 (two) times daily as needed (dry eyes).    [provider]  propafenone (RYTHMOL) 225 MG tablet Take 1 tablet (225 mg total) by mouth 3 (three) times daily. 01/24/17   Deboraha Sprang, MD  sertraline (ZOLOFT) 50 MG tablet Take 50  mg by mouth daily.  07/18/13   [provider]  sodium chloride (OCEAN) 0.65 % SOLN nasal spray Place 1 spray into both nostrils as needed for congestion.    [provider]    Family History Family History  Problem Relation Age of Onset  . Cancer Mother   . Mitral valve prolapse Mother   . Diabetes Father   . Hypertension Father   . Coronary artery disease Sister   . Stroke Brother   . Coronary artery disease Sister     Social History Social History   Tobacco Use  . Smoking status: Former Smoker    Last attempt to quit: 08/13/1984    Years since quitting: 33.2  . Smokeless tobacco: Never Used  Substance Use Topics  . Alcohol use: Yes    Alcohol/week: 8.4 oz    Types: 14 Glasses of wine per week  . Drug use: No     Allergies   Gluten meal; Itraconazole; and Lactose intolerance (gi)   Review of Systems Review of Systems  Constitutional: Negative for appetite change, fatigue and fever.  Respiratory: Negative for shortness of breath.   Cardiovascular: Negative for chest pain.  Gastrointestinal: Positive for abdominal pain, nausea and vomiting. Negative for blood in stool and diarrhea.  Genitourinary: Positive for hematuria. Negative for dysuria, frequency, pelvic pain, urgency, vaginal bleeding and vaginal pain.  Skin: Negative for rash.  Neurological: Positive for dizziness. Negative for headaches.     Physical Exam Triage Vital Signs ED Triage Vitals [10/23/17 1154]  Enc Vitals Group     BP 136/82     Pulse Rate 90     Resp 18     Temp (!) 97.1 F (36.2 C)     Temp src      SpO2 96 %     Weight      Height      Head Circumference      Peak Flow      Pain Score      Pain Loc      Pain Edu?      Excl. in [place]?    No data found.  Updated Vital Signs BP 136/82   Pulse 90   Temp (!) 97.1 F (36.2 C)   Resp 18   SpO2 96%   Visual Acuity Right Eye Distance:   Left Eye Distance:   Bilateral Distance:    Right Eye Near:   Left  Eye Near:    Bilateral Near:     Physical Exam  Constitutional: She appears well-developed and well-nourished.  Elderly woman sitting comfortably on exam table and chatting about multiple things. No acute distress. Does not appear to be in pain.   HENT:  Head: Normocephalic and atraumatic.  Mouth/Throat: Oropharynx is clear  and moist.  Cardiovascular: Normal rate.  Pulmonary/Chest: Effort normal and breath sounds normal. No respiratory distress. She has no wheezes.  Abdominal: Soft. Normal appearance. She exhibits no distension. There is no tenderness. There is no rigidity, no guarding, no CVA tenderness, no tenderness at McBurney's point and negative Murphy's sign.  Skin: Skin is warm and dry.     UC Treatments / Results  Labs (all labs ordered are listed, but only abnormal results are displayed) Labs Reviewed  URINE CULTURE  URINALYSIS, ROUTINE W REFLEX MICROSCOPIC    EKG  EKG Interpretation None       Radiology No results found.  Procedures Procedures (including critical care time)  Medications Ordered in UC Medications - No data to display   Initial Impression / Assessment and Plan / UC Course  I have reviewed the triage vital signs and the nursing notes.  Pertinent labs & imaging results that were available during my care of the patient were reviewed by me and considered in my medical decision making (see chart for details).    Patients abdominal pain has resolved. Stable for discharge. Unclear if pain and hematuria from kidney stone vs. Infection vs. Intraabdominal pathology. Although I believe the stone is the most consistent with symptoms and story. She is unable to use the bathroom in clinic today. She plans to go home and return her urine sample later today.   Advised to monitor her urine for continued blood, return if bleeding persists, abdominal pain returns, or for dizziness and lightheadedness.   Final Clinical Impressions(s) / UC Diagnoses   Final  diagnoses:  Right lower quadrant abdominal pain  Gross hematuria    ED Discharge Orders    None       Controlled Substance Prescriptions [place] Controlled Substance Registry consulted? Not Applicable   Janith Lima, [place] 10/23/17 1845

## 2017-10-24 ENCOUNTER — Telehealth (HOSPITAL_COMMUNITY): Payer: Self-pay | Admitting: *Deleted

## 2017-10-24 NOTE — Telephone Encounter (Signed)
Patient to UC on 10/23/17 for UTI symptoms. Patient unable to produce urine sample during visit. Patient given supplies to collect specimen at home and bring back to UC. Patient back at this time, 10/24/17 1720, with clean catch urine. New orders placed in Epic to reflect orders from visit on 10/22/17.

## 2017-10-26 LAB — URINE CULTURE: Culture: 70000 — AB

## 2017-10-29 MED ORDER — CEPHALEXIN 500 MG PO CAPS: 500.0000 mg | ORAL_CAPSULE | Freq: Two times a day (BID) | ORAL | 0 refills | Status: AC

## 2017-10-29 NOTE — Telephone Encounter (Signed)
Clinical staff, please let patient know that urine culture was positive for E coli germ, sensitive to cephalexin.  This finding is of uncertain significance as patient's symptoms at urgent care visit were more consistent with passage of kidney stone than with UTI.  However, will send rx cephalexin to the pharmacy of record, [facility] on [address].  Recheck or followup with PCP for further evaluation if symptoms are not improving.  LM

## 2017-11-08 ENCOUNTER — Ambulatory Visit (INDEPENDENT_AMBULATORY_CARE_PROVIDER_SITE_OTHER): Payer: Medicare Other | Admitting: Internal Medicine

## 2017-11-08 ENCOUNTER — Encounter: Payer: Self-pay | Admitting: Internal Medicine

## 2017-11-08 VITALS — BP 142/90 | HR 90 | Ht 67.0 in | Wt 161.0 lb

## 2017-11-08 DIAGNOSIS — I495 Sick sinus syndrome: Secondary | ICD-10-CM

## 2017-11-08 DIAGNOSIS — Z950 Presence of cardiac pacemaker: Secondary | ICD-10-CM

## 2017-11-08 DIAGNOSIS — I480 Paroxysmal atrial fibrillation: Secondary | ICD-10-CM | POA: Diagnosis not present

## 2017-11-08 LAB — CUP PACEART INCLINIC DEVICE CHECK
Battery Remaining Longevity: 171 mo
Battery Voltage: 3.16 V
Brady Statistic AS VP Percent: 0.03 %
Brady Statistic RA Percent Paced: 41.71 %
Brady Statistic RV Percent Paced: 0.04 %
Date Time Interrogation Session: 20181213163912
Implantable Lead Location: 753859
Implantable Lead Model: 5076
Implantable Pulse Generator Implant Date: 20180611
Lead Channel Impedance Value: 399 Ohm
Lead Channel Pacing Threshold Pulse Width: 0.4 ms
Lead Channel Sensing Intrinsic Amplitude: 2.875 mV
Lead Channel Sensing Intrinsic Amplitude: 4.875 mV
Lead Channel Setting Pacing Pulse Width: 0.4 ms
Lead Channel Setting Sensing Sensitivity: 0.9 mV
MDC IDC LEAD IMPLANT DT: 20061019
MDC IDC LEAD IMPLANT DT: 20061019
MDC IDC LEAD LOCATION: 753860
MDC IDC MSMT LEADCHNL RA IMPEDANCE VALUE: 570 Ohm
MDC IDC MSMT LEADCHNL RA IMPEDANCE VALUE: 627 Ohm
MDC IDC MSMT LEADCHNL RA PACING THRESHOLD AMPLITUDE: 0.5 V
MDC IDC MSMT LEADCHNL RA SENSING INTR AMPL: 2.375 mV
MDC IDC MSMT LEADCHNL RV IMPEDANCE VALUE: 323 Ohm
MDC IDC MSMT LEADCHNL RV PACING THRESHOLD AMPLITUDE: 0.75 V
MDC IDC MSMT LEADCHNL RV PACING THRESHOLD PULSEWIDTH: 0.4 ms
MDC IDC MSMT LEADCHNL RV SENSING INTR AMPL: 3.125 mV
MDC IDC SET LEADCHNL RA PACING AMPLITUDE: 1.5 V
MDC IDC SET LEADCHNL RV PACING AMPLITUDE: 2.5 V
MDC IDC STAT BRADY AP VP PERCENT: 0.01 %
MDC IDC STAT BRADY AP VS PERCENT: 41.78 %
MDC IDC STAT BRADY AS VS PERCENT: 58.19 %

## 2017-11-08 NOTE — Progress Notes (Signed)
HPI  [name] [name] is a 83 y.o. 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 Underwent generator replacement 5/18  Device interrogation demonstrated paroxysms of atrial fibrillation with ventricular rates into the 180s 190s.  She was started on low-dose beta blockers. Previously she did not tolerate metoprolol succinate.  She was also switched and calcium blockers which have been associated with some peripheral edema. Her atrial fibrillation has been much better since its initiation.  At the last visit we discontinued the calcium blockers because of the edema and started her on propafenone.     Echocardiogram 8/15 demonstrated normal LV function  DATE QRSduration Dose  2/12 128 0  9/16 128 225  2/17 126 225     Date Cr Hgb  6/18 0.89 13.6        apixaban without bleeding difficulties  No symptomatic atrial fibrillation  Exercise tolerance with no shortness of breath or chest pain  Chronic mild lower extremity edema; sodium intake diminished fluid intake replete  Interval problem with urinary tract bleeding associated with an infection.  I reviewed these data with the patient.   Past Medical History:  Diagnosis Date  . Atrial fibrillation ([place])   . CVA    Lacunar and cerebellar infarct  . Depression   . DVT (deep venous thrombosis) ([place])    Details not available in the chart  . Edema   . Mitral valve regurgitation    a. echo 2009 mild to moderate MR  . Sinoatrial node dysfunction ([facility])   . Unspecified essential hypertension     Past Surgical History:  Procedure Laterality Date  . PACEMAKER INSERTION  09-14-2005   MDT EnRhythm dual chamber pacemaker implanted by Dr Lovena Le for SND  . PPM GENERATOR CHANGEOUT N/A 05/07/2017   Procedure: PPM Generator Changeout;  Surgeon: Deboraha Sprang, MD;  Location: [facility];  Service: Cardiovascular;   Laterality: N/A;    Current Outpatient Medications  Medication Sig Dispense Refill  . acetaminophen (TYLENOL) 650 MG CR tablet Take 650 mg by mouth every 8 (eight) hours as needed for pain.    Marland Kitchen allopurinol (ZYLOPRIM) 100 MG tablet Take 100 mg by mouth daily.    Marland Kitchen apixaban (ELIQUIS) 5 MG TABS tablet Take 1 tablet (5 mg total) by mouth 2 (two) times daily. 60 tablet 10  . Apoaequorin (PREVAGEN) 10 MG CAPS Take 10 mg by mouth daily.     . Biotin (BIOTIN 5000) 5 MG CAPS Take 5 mg by mouth daily.    . Cholecalciferol (VITAMIN D3) 2000 UNITS TABS Take 2,000 Units by mouth daily.     Marland Kitchen desonide (DESOWEN) 0.05 % ointment Apply 1 application topically 2 (two) times daily as needed (dry skin).    Marland Kitchen diltiazem (CARDIZEM) 30 MG tablet Take 1 tablet (30 mg total) by mouth daily as needed. FOR PALPITATIONS (Patient taking differently: Take 30-60 mg by mouth daily as needed (for palpitations). ) 45 tablet 6  . folic acid (FOLVITE) 1 MG tablet Take 1 mg by mouth once a week.     Marland Kitchen LORazepam (ATIVAN) 0.5 MG tablet Take 0.5 mg by mouth at bedtime as needed for sleep.     . Polyvinyl Alcohol-Povidone (REFRESH OP) Apply 1 drop to eye 2 (two) times daily as needed (dry eyes).    . propafenone (RYTHMOL) 225 MG tablet Take 1 tablet (225 mg total) by mouth 3 (three)  times daily. 270 tablet 2  . sertraline (ZOLOFT) 50 MG tablet Take 50 mg by mouth daily.     . sodium chloride (OCEAN) 0.65 % SOLN nasal spray Place 1 spray into both nostrils as needed for congestion.     No current facility-administered medications for this visit.     Allergies  Allergen Reactions  . Gluten Meal Diarrhea  . Itraconazole Diarrhea and Itching  . Lactose Intolerance (Gi) Diarrhea and Nausea Only    Pt unsure    Review of Systems negative except from HPI and PMH  Physical Exam BP (!) 142/90   Pulse 90   Ht 5\' 7"  (1.702 m)   Wt 161 lb (73 kg)   SpO2 95%   BMI 25.22 kg/m  Well developed and nourished in no acute  distress HENT normal Neck supple with JVP-flat Clear Regular rate and rhythm, no murmurs or gallops Abd-soft with active BS No Clubbing cyanosis edema Skin-warm and dry A & Oriented  Grossly normal sensory and motor function   ECG NSR 71 18/12/43 so you does have your Christmas concert choir RBBB  Assessment and  Plan  Atrial fibrillation-paroxysmal  Hypertension   Sinus node dysfunction   Pacemaker-Medtronic The patient's device was interrogated.  The information was reviewed. No changes were made in the programming.      Infrequent atrial fibrillation.  No associated symptoms  No interval bleeding apart from the urine associate with a UTI  BP mildly elevated will continue to follow

## 2017-11-08 NOTE — Patient Instructions (Signed)
Medication Instructions: Your physician recommends that you continue on your current medications as directed. Please refer to the Current Medication list given to you today.  Labwork: None Ordered  Procedures/Testing: None Ordered  Follow-Up: Your physician wants you to follow-up in: 9 MONTHS with Dr. Caryl Comes. You will receive a reminder letter in the mail two months in advance. If you don't receive a letter, please call our office to schedule the follow-up appointment.  Remote monitoring is used to monitor your Pacemaker from home. This monitoring reduces the number of office visits required to check your device to one time per year. It allows [place] to keep an eye on the functioning of your device to ensure it is working properly. You are scheduled for a device check from home on 02/07/18. You may send your transmission at any time that day. If you have a wireless device, the transmission will be sent automatically. After your physician reviews your transmission, you will receive a postcard with your next transmission date.   If you need a refill on your cardiac medications before your next appointment, please call your pharmacy.   - Patient Experience Contact [phone] -

## 2017-11-28 ENCOUNTER — Telehealth: Payer: Self-pay | Admitting: Internal Medicine

## 2017-11-28 NOTE — Telephone Encounter (Signed)
Left message for patient to call back  

## 2017-11-28 NOTE — Telephone Encounter (Signed)
New Message  Pt call requesting to speak with RN. Pt states she has been in afib for the pass 3-4 days. Pt states it has gotten worse. She states she has a lot of welling in both legs feet and ankles. Pt would like to discuss with RN is she needs to make an appt. Please call back to discuss

## 2017-11-29 NOTE — Telephone Encounter (Addendum)
Walked patient through how to send a remote transmission successfully. - Transmission shows AT/AF Burden of 10.4% with 61 AHR - available EGMs show Aflutter

## 2017-11-29 NOTE — Telephone Encounter (Signed)
Patient called and states that she was in Afib Friday night and Saturday night. She states that she could feel her heart racing and she was SOB. She states that she took 2 of her diltiazem and her ativan and it helped. She states that she remained in Afib until this morning. She states that she took 2 diltiazem this morning as well. She states that she is back in rhythm now. Patient states that she has been sleeping sitting up and she had swelling in both of extremities. She states that this has also improved as well. Patient states that she is feeling better today. Patient states that she has only been taking her propafenone 225 mg BID instead of TID. Made patient aware that she needs to take her propafenone THREE times a day as directed and that she can use her diltiazem PRN as prescribed. Made patient aware that I will send this message to the device RN to help patient to send a transmission from home. Also made patient aware that the information would be sent to Dr. Caryl Comes for review and further recommendation. Patient verbalized understanding and thanked me for the call.

## 2017-11-30 NOTE — Telephone Encounter (Signed)
Discussed with Dr. Caryl Comes. Will continue to monitor for now with the patient taking the propafenone 225 mg (1 tablet) TID to see if her symptoms improve and if not we may make medication changes. Dr. Caryl Comes also said that if it too difficult for the patient to take the propafenone TID that she can take 1.5 tablets BID. Made patient aware. She states that she is going to take it TID for now and let us know if her symptoms worsen.

## 2017-11-30 NOTE — Telephone Encounter (Signed)
Noted  May need a different antiarryhmic drug  We will see

## 2017-12-01 ENCOUNTER — Inpatient Hospital Stay (HOSPITAL_COMMUNITY)
Admission: EM | Admit: 2017-12-01 | Discharge: 2017-12-03 | DRG: 310 | Disposition: A | Discharge status: Home / Self Care | Payer: Medicare Other | Attending: Internal Medicine | Admitting: Internal Medicine

## 2017-12-01 ENCOUNTER — Emergency Department (HOSPITAL_COMMUNITY): Payer: Medicare Other

## 2017-12-01 DIAGNOSIS — Z66 Do not resuscitate: Secondary | ICD-10-CM | POA: Diagnosis not present

## 2017-12-01 DIAGNOSIS — R002 Palpitations: Secondary | ICD-10-CM | POA: Diagnosis not present

## 2017-12-01 DIAGNOSIS — R531 Weakness: Secondary | ICD-10-CM | POA: Diagnosis present

## 2017-12-01 DIAGNOSIS — I340 Nonrheumatic mitral (valve) insufficiency: Secondary | ICD-10-CM | POA: Diagnosis present

## 2017-12-01 DIAGNOSIS — Z86718 Personal history of other venous thrombosis and embolism: Secondary | ICD-10-CM

## 2017-12-01 DIAGNOSIS — R0789 Other chest pain: Secondary | ICD-10-CM | POA: Diagnosis present

## 2017-12-01 DIAGNOSIS — Z7901 Long term (current) use of anticoagulants: Secondary | ICD-10-CM

## 2017-12-01 DIAGNOSIS — E739 Lactose intolerance, unspecified: Secondary | ICD-10-CM | POA: Diagnosis present

## 2017-12-01 DIAGNOSIS — T462X5A Adverse effect of other antidysrhythmic drugs, initial encounter: Secondary | ICD-10-CM | POA: Diagnosis not present

## 2017-12-01 DIAGNOSIS — R079 Chest pain, unspecified: Secondary | ICD-10-CM | POA: Diagnosis not present

## 2017-12-01 DIAGNOSIS — D696 Thrombocytopenia, unspecified: Secondary | ICD-10-CM | POA: Diagnosis present

## 2017-12-01 DIAGNOSIS — Z8673 Personal history of transient ischemic attack (TIA), and cerebral infarction without residual deficits: Secondary | ICD-10-CM

## 2017-12-01 DIAGNOSIS — Z950 Presence of cardiac pacemaker: Secondary | ICD-10-CM | POA: Diagnosis present

## 2017-12-01 DIAGNOSIS — R0602 Shortness of breath: Secondary | ICD-10-CM | POA: Diagnosis not present

## 2017-12-01 DIAGNOSIS — R29700 NIHSS score 0: Secondary | ICD-10-CM | POA: Diagnosis present

## 2017-12-01 DIAGNOSIS — E782 Mixed hyperlipidemia: Secondary | ICD-10-CM | POA: Diagnosis present

## 2017-12-01 DIAGNOSIS — G459 Transient cerebral ischemic attack, unspecified: Secondary | ICD-10-CM | POA: Diagnosis present

## 2017-12-01 DIAGNOSIS — Z79899 Other long term (current) drug therapy: Secondary | ICD-10-CM

## 2017-12-01 DIAGNOSIS — R42 Dizziness and giddiness: Secondary | ICD-10-CM | POA: Diagnosis not present

## 2017-12-01 DIAGNOSIS — R402413 Glasgow coma scale score 13-15, at hospital admission: Secondary | ICD-10-CM | POA: Diagnosis present

## 2017-12-01 DIAGNOSIS — I480 Paroxysmal atrial fibrillation: Secondary | ICD-10-CM | POA: Diagnosis present

## 2017-12-01 DIAGNOSIS — I10 Essential (primary) hypertension: Secondary | ICD-10-CM | POA: Diagnosis present

## 2017-12-01 DIAGNOSIS — R600 Localized edema: Secondary | ICD-10-CM | POA: Diagnosis present

## 2017-12-01 DIAGNOSIS — Z888 Allergy status to other drugs, medicaments and biological substances status: Secondary | ICD-10-CM

## 2017-12-01 DIAGNOSIS — F329 Major depressive disorder, single episode, unspecified: Secondary | ICD-10-CM | POA: Diagnosis present

## 2017-12-01 DIAGNOSIS — Z87891 Personal history of nicotine dependence: Secondary | ICD-10-CM

## 2017-12-01 DIAGNOSIS — R2689 Other abnormalities of gait and mobility: Secondary | ICD-10-CM | POA: Diagnosis present

## 2017-12-01 DIAGNOSIS — F419 Anxiety disorder, unspecified: Secondary | ICD-10-CM | POA: Diagnosis present

## 2017-12-01 DIAGNOSIS — R278 Other lack of coordination: Secondary | ICD-10-CM

## 2017-12-01 DIAGNOSIS — R404 Transient alteration of awareness: Secondary | ICD-10-CM | POA: Diagnosis not present

## 2017-12-01 DIAGNOSIS — Z91018 Allergy to other foods: Secondary | ICD-10-CM

## 2017-12-01 DIAGNOSIS — R270 Ataxia, unspecified: Secondary | ICD-10-CM

## 2017-12-01 LAB — I-STAT TROPONIN, ED: Troponin i, poc: 0 ng/mL (ref 0.00–0.08)

## 2017-12-01 LAB — BASIC METABOLIC PANEL
Anion gap: 8 (ref 5–15)
BUN: 14 mg/dL (ref 6–20)
CHLORIDE: 104 mmol/L (ref 101–111)
CO2: 23 mmol/L (ref 22–32)
CREATININE: 1.08 mg/dL — AB (ref 0.44–1.00)
Calcium: 8.9 mg/dL (ref 8.9–10.3)
GFR calc Af Amer: 53 mL/min — ABNORMAL LOW (ref >60)
GFR calc non Af Amer: 46 mL/min — ABNORMAL LOW (ref >60)
Glucose, Bld: 119 mg/dL — ABNORMAL HIGH (ref 65–99)
POTASSIUM: 3.9 mmol/L (ref 3.5–5.1)
Sodium: 135 mmol/L (ref 135–145)

## 2017-12-01 LAB — CBC
HEMATOCRIT: 41.5 % (ref 36.0–46.0)
Hemoglobin: 13.6 g/dL (ref 12.0–15.0)
MCH: 32.1 pg (ref 26.0–34.0)
MCHC: 32.8 g/dL (ref 30.0–36.0)
MCV: 97.9 fL (ref 78.0–100.0)
Platelets: 141 10*3/uL — ABNORMAL LOW (ref 150–400)
RBC: 4.24 MIL/uL (ref 3.87–5.11)
RDW: 14.4 % (ref 11.5–15.5)
WBC: 6.4 10*3/uL (ref 4.0–10.5)

## 2017-12-01 LAB — BRAIN NATRIURETIC PEPTIDE: B Natriuretic Peptide: 98.2 pg/mL (ref 0.0–100.0)

## 2017-12-01 MED ORDER — PROPAFENONE HCL 225 MG PO TABS
225.0000 mg | ORAL_TABLET | Freq: Three times a day (TID) | ORAL | Status: DC
  Administered 2017-12-02 (×2): 225 mg via ORAL
  Filled 2017-12-01 (×4): qty 1

## 2017-12-01 MED ORDER — APIXABAN 5 MG PO TABS
5.0000 mg | ORAL_TABLET | Freq: Two times a day (BID) | ORAL | Status: DC
  Administered 2017-12-02 – 2017-12-03 (×4): 5 mg via ORAL
  Filled 2017-12-01 (×5): qty 1

## 2017-12-01 NOTE — ED Notes (Signed)
Delay in lab draw,  Pt not in room 

## 2017-12-01 NOTE — ED Notes (Signed)
Called lab and

## 2017-12-01 NOTE — ED Notes (Signed)
Patient transported to X-ray 

## 2017-12-01 NOTE — ED Notes (Signed)
[name] [name] [phone]

## 2017-12-01 NOTE — ED Provider Notes (Signed)
[name] Provider Note  CSN: [id number] Arrival date & time: 12/01/17 2008  Chief Complaint(s) Palpitations; Chest Pain; and Dizziness  HPI [name] is a 84 y.o. female with a history of atrial fibrillation on propafenone and Eliquis who reports that she has been having intermittent episodes of tachycardia that responded to diltiazem.  These have been ongoing for approximately 1 week.  She discussed this with her cardiologist who did interrogation of her pacemaker at home which noted several episodes of A. Fib.  The patient is supposed to be taking propafenone 3 times daily but states that she usually only takes it twice a day.  After speaking with the cardiologist recommended that she start taking it 3 times a day and if that does not help she will need to be reassessed for medication changes.  During these episodes she endorses chest discomfort and shortness of breath which resolved after her tachycardia resolves.  She denies recent fevers or infections.    Additionally patient endorses sensation of feeling imbalance with ambulation and states that she feels like she might of had a stroke.  She reports that she has difficulty playing the piano since these episodes began.  She states that she takes her Eliquis as instructed and has not missed any doses.  She denies any falls or head injury.  Denies any focal weakness.  In addition patient also endorses dependent bilateral peripheral edema which improves at night with elevation.  The history is provided by the patient.   Past Medical History Past Medical History:  Diagnosis Date  . Atrial fibrillation ([place])   . CVA    Lacunar and cerebellar infarct  . Depression   . DVT (deep venous thrombosis) ([place])    Details not available in the chart  . Edema   . Mitral valve regurgitation    a. echo 2009 mild to moderate MR  . Sinoatrial node dysfunction ([facility])   . Unspecified essential  hypertension    Patient Active Problem List   Diagnosis Date Noted  . PAF (paroxysmal atrial fibrillation) ([place]) 06/29/2014  . SVT (supraventricular tachycardia) ([place]) 06/29/2014  . Frequent falls 06/29/2014  . Chronic anticoagulation 06/29/2014  . Encounter for therapeutic drug monitoring 12/31/2013  . Edema 10/21/2013  . Hair thinning 09/26/2012  . Mixed hyperlipidemia 02/15/2010  . PACEMAKER-Medtronic 08/12/2009  . MITRAL INSUFFICIENCY 02/17/2009  . Essential hypertension 02/17/2009  . SICK SINUS SYNDROME 02/17/2009  . DVT 02/17/2009   Home Medication(s) Prior to Admission medications   Medication Sig Start Date End Date Taking? Authorizing Provider  acetaminophen (TYLENOL) 650 MG CR tablet Take 650 mg by mouth every 8 (eight) hours as needed for pain.   Yes [provider]  allopurinol (ZYLOPRIM) 100 MG tablet Take 100 mg by mouth daily.   Yes [provider]  apixaban (ELIQUIS) 5 MG TABS tablet Take 1 tablet (5 mg total) by mouth 2 (two) times daily. 08/08/17  Yes Josue Hector, MD  Apoaequorin (PREVAGEN) 10 MG CAPS Take 10 mg by mouth daily.    Yes [provider]  Cholecalciferol (VITAMIN D3) 2000 UNITS TABS Take 2,000 Units by mouth daily.    Yes [provider]  desonide (DESOWEN) 0.05 % ointment Apply 1 application topically 2 (two) times daily as needed (dry skin).   Yes [provider]  diltiazem (CARDIZEM) 30 MG tablet Take 1 tablet (30 mg total) by mouth daily as needed. FOR PALPITATIONS Patient taking differently: Take 30-60 mg  by mouth daily as needed (for palpitations).  03/28/17  Yes Josue Hector, MD  folic acid (FOLVITE) 1 MG tablet Take 1 mg by mouth once a week.    Yes [provider]  LORazepam (ATIVAN) 0.5 MG tablet Take 0.5 mg by mouth at bedtime as needed for sleep.    Yes [provider]  Polyvinyl Alcohol-Povidone (REFRESH OP) Apply 1 drop to eye 2 (two) times daily as needed (dry eyes).   Yes  [provider]  propafenone (RYTHMOL) 225 MG tablet Take 1 tablet (225 mg total) by mouth 3 (three) times daily. 01/24/17  Yes Deboraha Sprang, MD  sodium chloride (OCEAN) 0.65 % SOLN nasal spray Place 1 spray into both nostrils as needed for congestion.   Yes [provider]                                                                                                                                    Past Surgical History Past Surgical History:  Procedure Laterality Date  . PACEMAKER INSERTION  09-14-2005   MDT EnRhythm dual chamber pacemaker implanted by Dr Lovena Le for SND  . PPM GENERATOR CHANGEOUT N/A 05/07/2017   Procedure: PPM Generator Changeout;  Surgeon: Deboraha Sprang, MD;  Location: [facility];  Service: Cardiovascular;  Laterality: N/A;   Family History Family History  Problem Relation Age of Onset  . Cancer Mother   . Mitral valve prolapse Mother   . Diabetes Father   . Hypertension Father   . Coronary artery disease Sister   . Stroke Brother   . Coronary artery disease Sister     Social History Social History   Tobacco Use  . Smoking status: Former Smoker    Last attempt to quit: 08/13/1984    Years since quitting: 33.3  . Smokeless tobacco: Never Used  Substance Use Topics  . Alcohol use: Yes    Alcohol/week: 8.4 oz    Types: 14 Glasses of wine per week  . Drug use: No   Allergies Gluten meal; Itraconazole; and Lactose intolerance (gi)  Review of Systems Review of Systems   All other systems are reviewed and are negative for acute change except as noted in the HPI  Physical Exam Vital Signs  I have reviewed the triage vital signs BP (!) 154/91 (BP Location: Right Arm)   Pulse 98   Temp 98.4 F (36.9 C) (Oral)   Resp 16   SpO2 98%   Physical Exam  Constitutional: She is oriented to person, place, and time. She appears well-developed and well-nourished. No distress.  HENT:  Head: Normocephalic and atraumatic.  Nose:  Nose normal.  Eyes: Conjunctivae and EOM are normal. Pupils are equal, round, and reactive to light. Right eye exhibits no discharge. Left eye exhibits no discharge. No scleral icterus.  Neck: Normal range of motion. Neck supple.  Cardiovascular: Normal rate and regular rhythm.  Exam reveals no gallop and no friction rub.  No murmur heard. Pulmonary/Chest: Effort normal and breath sounds normal. No stridor. No respiratory distress. She has no rales.  Abdominal: Soft. She exhibits no distension. There is no tenderness.  Musculoskeletal: She exhibits no edema or tenderness.  Neurological: She is alert and oriented to person, place, and time.  Mental Status:  Alert and oriented to person, place, and time.  Attention and concentration normal.  Speech clear.  Recent memory is intact  Cranial Nerves:  II Visual Fields: Intact to confrontation. Visual fields intact. III, IV, VI: Pupils equal and reactive to light and near. Full eye movement without nystagmus  V Facial Sensation: Normal. No weakness of masticatory muscles  VII: No facial weakness or asymmetry  VIII Auditory Acuity: Grossly normal  IX/X: The uvula is midline; the palate elevates symmetrically  XI: Normal sternocleidomastoid and trapezius strength  XII: The tongue is midline. No atrophy or fasciculations.   Motor System: Muscle Strength: 5/5 and symmetric in the upper and lower extremities. No pronation or drift.  Muscle Tone: Tone and muscle bulk are normal in the upper and lower extremities.   Reflexes: DTRs: 1+ and symmetrical in all four extremities. No Clonus Coordination: mild dysmetria with finger-to-nose on the left, intact heel-to-shin. No tremor.  Sensation: Intact to light touch, and pinprick. Negative Romberg test.  Gait: ataxia   Skin: Skin is warm and dry. No rash noted. She is not diaphoretic. No erythema.  Psychiatric: She has a normal mood and affect.  Vitals reviewed.   ED Results and  Treatments Labs (all labs ordered are listed, but only abnormal results are displayed) Labs Reviewed  BASIC METABOLIC PANEL - Abnormal; Notable for the following components:      Result Value   Glucose, Bld 119 (*)    Creatinine, Ser 1.08 (*)    GFR calc non Af Amer 46 (*)    GFR calc Af Amer 53 (*)    All other components within normal limits  CBC - Abnormal; Notable for the following components:   Platelets 141 (*)    All other components within normal limits  BRAIN NATRIURETIC PEPTIDE  I-STAT TROPONIN, ED                                                                                                                         EKG  EKG Interpretation  Date/Time:  Saturday December 01 2017 20:49:03 EST Ventricular Rate:  84 PR Interval:    QRS Duration: 137 QT Interval:  457 QTC Calculation: 541 R Axis:   103 Text Interpretation:  Sinus rhythm Borderline prolonged PR interval RBBB and LPFB Borderline ST elevation with QRS widening Otherwise no significant change Confirmed by Addison Lank [id number]) on 12/01/2017 9:25:14 PM      Radiology Dg Chest 2 View  Result Date: 12/01/2017 CLINICAL DATA:  Chest pain and shortness of breath. Palpitations and dizziness. EXAM: CHEST  2 VIEW COMPARISON:  Radiographs 11/24/2016 FINDINGS:  Left-sided pacemaker in place. The cardiomediastinal contours are unchanged with aortic atherosclerosis. Mild eventration of right hemidiaphragm. Pulmonary vasculature is normal. No consolidation, pleural effusion, or pneumothorax. No acute osseous abnormalities are seen. IMPRESSION: 1. No acute abnormality. 2. Aortic atherosclerosis. Electronically Signed   By: Jeb Levering M.D.   On: 12/01/2017 21:37   Ct Head Wo Contrast  Result Date: 12/01/2017 CLINICAL DATA:  Chest pain, palpitations, and dizziness. Shortness of breath. EXAM: CT HEAD WITHOUT CONTRAST TECHNIQUE: Contiguous axial images were obtained from the base of the skull through the vertex without  intravenous contrast. COMPARISON:  06/07/2016 FINDINGS: Brain: Diffuse cerebral atrophy. Ventricular dilatation consistent with central atrophy. Low-attenuation changes throughout the deep white matter consistent with small vessel ischemia. No mass-effect or midline shift. No abnormal extra-axial fluid collections. Gray-white matter junctions are distinct. Basal cisterns are not effaced. No acute intracranial hemorrhage. Vascular: Intracranial artery vascular calcifications are present. Skull: Calvarium is intact. Sinuses/Orbits: Paranasal sinuses and mastoid air cells are not opacified. Other: None. IMPRESSION: No acute intracranial abnormalities. Prominent chronic atrophy and small vessel ischemic changes. Electronically Signed   By: Lucienne Capers M.D.   On: 12/01/2017 23:13   Pertinent labs & imaging results that were available during my care of the patient were reviewed by me and considered in my medical decision making (see chart for details).  Medications Ordered in ED Medications  apixaban (ELIQUIS) tablet 5 mg (not administered)  propafenone (RYTHMOL) tablet 225 mg (not administered)                                                                                                                                    Procedures Procedures  (including critical care time)  Medical Decision Making / ED Course I have reviewed the nursing notes for this encounter and the patient's prior records (if available in EHR or on provided paperwork).    Interrogation of the patient's pacemaker revealed no events since the last interrogation.  It appears that her compliance with propafenone is resulting in improve rhythms. Patient is in A. fib and currently rate controlled.  CT of the head was unremarkable.  Given her dysmetria and ataxia will obtain an MRI of the brain to assess for CVA.  Patient care turned over to Dr Waverly Ferrari at 1230. Patient case and results discussed in detail; please see their note  for further ED managment.      This chart was dictated using voice recognition software.  Despite best efforts to proofread,  errors can occur which can change the documentation meaning.   Fatima Blank, MD 12/02/17 [phone]

## 2017-12-01 NOTE — ED Triage Notes (Signed)
Pt BIB [facility] for chest pain/ palpitations and dizziness. Pt went to get dinner around 1630 and didn't feel like eating shortly after she went to check the mail and experienced [facility] and Chest pain which has now resolved.

## 2017-12-02 ENCOUNTER — Other Ambulatory Visit: Payer: Self-pay

## 2017-12-02 ENCOUNTER — Observation Stay (HOSPITAL_COMMUNITY): Payer: Medicare Other

## 2017-12-02 DIAGNOSIS — F419 Anxiety disorder, unspecified: Secondary | ICD-10-CM | POA: Diagnosis present

## 2017-12-02 DIAGNOSIS — Z91018 Allergy to other foods: Secondary | ICD-10-CM | POA: Diagnosis not present

## 2017-12-02 DIAGNOSIS — R402413 Glasgow coma scale score 13-15, at hospital admission: Secondary | ICD-10-CM | POA: Diagnosis present

## 2017-12-02 DIAGNOSIS — Z888 Allergy status to other drugs, medicaments and biological substances status: Secondary | ICD-10-CM | POA: Diagnosis not present

## 2017-12-02 DIAGNOSIS — I10 Essential (primary) hypertension: Secondary | ICD-10-CM

## 2017-12-02 DIAGNOSIS — I480 Paroxysmal atrial fibrillation: Principal | ICD-10-CM

## 2017-12-02 DIAGNOSIS — Z950 Presence of cardiac pacemaker: Secondary | ICD-10-CM | POA: Diagnosis not present

## 2017-12-02 DIAGNOSIS — G459 Transient cerebral ischemic attack, unspecified: Secondary | ICD-10-CM | POA: Diagnosis not present

## 2017-12-02 DIAGNOSIS — R531 Weakness: Secondary | ICD-10-CM | POA: Diagnosis present

## 2017-12-02 DIAGNOSIS — R600 Localized edema: Secondary | ICD-10-CM | POA: Diagnosis present

## 2017-12-02 DIAGNOSIS — R2689 Other abnormalities of gait and mobility: Secondary | ICD-10-CM | POA: Diagnosis present

## 2017-12-02 DIAGNOSIS — Z79899 Other long term (current) drug therapy: Secondary | ICD-10-CM | POA: Diagnosis not present

## 2017-12-02 DIAGNOSIS — R29700 NIHSS score 0: Secondary | ICD-10-CM | POA: Diagnosis present

## 2017-12-02 DIAGNOSIS — R002 Palpitations: Secondary | ICD-10-CM | POA: Diagnosis not present

## 2017-12-02 DIAGNOSIS — E782 Mixed hyperlipidemia: Secondary | ICD-10-CM

## 2017-12-02 DIAGNOSIS — I493 Ventricular premature depolarization: Secondary | ICD-10-CM

## 2017-12-02 DIAGNOSIS — Z8673 Personal history of transient ischemic attack (TIA), and cerebral infarction without residual deficits: Secondary | ICD-10-CM | POA: Diagnosis not present

## 2017-12-02 DIAGNOSIS — G458 Other transient cerebral ischemic attacks and related syndromes: Secondary | ICD-10-CM | POA: Diagnosis not present

## 2017-12-02 DIAGNOSIS — F329 Major depressive disorder, single episode, unspecified: Secondary | ICD-10-CM | POA: Diagnosis present

## 2017-12-02 DIAGNOSIS — R0789 Other chest pain: Secondary | ICD-10-CM | POA: Diagnosis present

## 2017-12-02 DIAGNOSIS — Z87891 Personal history of nicotine dependence: Secondary | ICD-10-CM | POA: Diagnosis not present

## 2017-12-02 DIAGNOSIS — E739 Lactose intolerance, unspecified: Secondary | ICD-10-CM | POA: Diagnosis present

## 2017-12-02 DIAGNOSIS — Z7901 Long term (current) use of anticoagulants: Secondary | ICD-10-CM | POA: Diagnosis not present

## 2017-12-02 DIAGNOSIS — Z86718 Personal history of other venous thrombosis and embolism: Secondary | ICD-10-CM | POA: Diagnosis not present

## 2017-12-02 DIAGNOSIS — T462X5A Adverse effect of other antidysrhythmic drugs, initial encounter: Secondary | ICD-10-CM | POA: Diagnosis present

## 2017-12-02 DIAGNOSIS — D696 Thrombocytopenia, unspecified: Secondary | ICD-10-CM | POA: Diagnosis present

## 2017-12-02 DIAGNOSIS — Z66 Do not resuscitate: Secondary | ICD-10-CM | POA: Diagnosis present

## 2017-12-02 DIAGNOSIS — I340 Nonrheumatic mitral (valve) insufficiency: Secondary | ICD-10-CM | POA: Diagnosis present

## 2017-12-02 LAB — BASIC METABOLIC PANEL
ANION GAP: 7 (ref 5–15)
BUN: 14 mg/dL (ref 6–20)
CALCIUM: 9.1 mg/dL (ref 8.9–10.3)
CO2: 23 mmol/L (ref 22–32)
Chloride: 105 mmol/L (ref 101–111)
Creatinine, Ser: 1.01 mg/dL — ABNORMAL HIGH (ref 0.44–1.00)
GFR, EST AFRICAN AMERICAN: 58 mL/min — AB (ref >60)
GFR, EST NON AFRICAN AMERICAN: 50 mL/min — AB (ref >60)
GLUCOSE: 140 mg/dL — AB (ref 65–99)
POTASSIUM: 3.5 mmol/L (ref 3.5–5.1)
SODIUM: 135 mmol/L (ref 135–145)

## 2017-12-02 LAB — CBC
HEMATOCRIT: 39 % (ref 36.0–46.0)
HEMOGLOBIN: 13.3 g/dL (ref 12.0–15.0)
MCH: 33.5 pg (ref 26.0–34.0)
MCHC: 34.1 g/dL (ref 30.0–36.0)
MCV: 98.2 fL (ref 78.0–100.0)
Platelets: 128 10*3/uL — ABNORMAL LOW (ref 150–400)
RBC: 3.97 MIL/uL (ref 3.87–5.11)
RDW: 14.6 % (ref 11.5–15.5)
WBC: 5.6 10*3/uL (ref 4.0–10.5)

## 2017-12-02 LAB — LIPID PANEL
CHOL/HDL RATIO: 3.8 ratio
CHOLESTEROL: 219 mg/dL — AB (ref 0–200)
HDL: 58 mg/dL (ref >40)
LDL CALC: 137 mg/dL — AB (ref 0–99)
TRIGLYCERIDES: 119 mg/dL (ref <150)
VLDL: 24 mg/dL (ref 0–40)

## 2017-12-02 LAB — HEMOGLOBIN A1C
HEMOGLOBIN A1C: 5.6 % (ref 4.8–5.6)
MEAN PLASMA GLUCOSE: 114.02 mg/dL

## 2017-12-02 LAB — TSH: TSH: 5.506 u[IU]/mL — ABNORMAL HIGH (ref 0.350–4.500)

## 2017-12-02 LAB — ECHOCARDIOGRAM COMPLETE: WEIGHTICAEL: 2588.8 [oz_av]

## 2017-12-02 LAB — T4, FREE: Free T4: 0.83 ng/dL (ref 0.61–1.12)

## 2017-12-02 MED ORDER — SALINE SPRAY 0.65 % NA SOLN: 1.0000 | NASAL | Status: DC | PRN

## 2017-12-02 MED ORDER — VITAMIN D 1000 UNITS PO TABS
2000.0000 [IU] | ORAL_TABLET | Freq: Every day | ORAL | Status: DC
  Administered 2017-12-02 – 2017-12-03 (×2): 2000 [IU] via ORAL
  Filled 2017-12-02 (×2): qty 2

## 2017-12-02 MED ORDER — LORATADINE 10 MG PO TABS
10.0000 mg | ORAL_TABLET | Freq: Every day | ORAL | Status: DC
  Administered 2017-12-02 – 2017-12-03 (×2): 10 mg via ORAL
  Filled 2017-12-02 (×2): qty 1

## 2017-12-02 MED ORDER — LORAZEPAM 0.5 MG PO TABS: 0.5000 mg | ORAL_TABLET | Freq: Every evening | ORAL | Status: DC | PRN

## 2017-12-02 MED ORDER — GUAIFENESIN ER 600 MG PO TB12
600.0000 mg | ORAL_TABLET | Freq: Two times a day (BID) | ORAL | Status: DC
  Administered 2017-12-02 – 2017-12-03 (×3): 600 mg via ORAL
  Filled 2017-12-02 (×3): qty 1

## 2017-12-02 MED ORDER — SIMETHICONE 80 MG PO CHEW
80.0000 mg | CHEWABLE_TABLET | Freq: Four times a day (QID) | ORAL | Status: DC | PRN
  Administered 2017-12-02: 80 mg via ORAL
  Filled 2017-12-02: qty 1

## 2017-12-02 MED ORDER — ALLOPURINOL 100 MG PO TABS
100.0000 mg | ORAL_TABLET | Freq: Every day | ORAL | Status: DC
  Administered 2017-12-02 – 2017-12-03 (×2): 100 mg via ORAL
  Filled 2017-12-02 (×2): qty 1

## 2017-12-02 MED ORDER — APOAEQUORIN 10 MG PO CAPS: 10.0000 mg | ORAL_CAPSULE | Freq: Every day | ORAL | Status: DC

## 2017-12-02 MED ORDER — ACETAMINOPHEN 325 MG PO TABS
650.0000 mg | ORAL_TABLET | Freq: Four times a day (QID) | ORAL | Status: DC | PRN
  Administered 2017-12-02: 650 mg via ORAL
  Filled 2017-12-02: qty 2

## 2017-12-02 MED ORDER — SODIUM CHLORIDE 0.9% FLUSH
3.0000 mL | Freq: Two times a day (BID) | INTRAVENOUS | Status: DC
  Administered 2017-12-02 (×2): 3 mL via INTRAVENOUS

## 2017-12-02 NOTE — Progress Notes (Addendum)
[facility]                                                                              Patient Demographics  [name], is a 84 y.o. female, DOB - Oct 12, 1932, JXB:[id number]  Admit date - 12/01/2017   Admitting Physician Vilma Prader, MD  Outpatient Primary MD for the patient is Lajean Manes, MD  Outpatient specialists:   LOS - 0  days   Medical records reviewed and are as summarized below:    Chief Complaint  Patient presents with  . Palpitations  . Chest Pain  . Dizziness       Brief summary   Patient is a 84 year old female with atrial fib on propafenone and Eliquis, CVA, anxiety presented with palpitations, lower extremity edema and some weakness to her legs, off balance, feeling difficult coordinating.  Patient reported 4-5 days history of palpitations with some chest discomfort and had called her cardiologist office.  She also reported lower extremity edema which has resolved.  She reported some weakness to her legs and being off balance.  She noticed that while playing the piano she missed multiple notes and had some difficulty coordinating her visual input to guide her hands.   Assessment & Plan    Principal Problem:   TIA (transient ischemic attack) -Presenting with weakness in her legs, gait instability, dysmetria, symptoms improving, possible TIA -MRI MRA pending (patient reports that she has a newer pacemaker which is an MRI compatible) -Follow 2D echo, carotid Dopplers -Continue apixaban -LDL 137, patient reports intolerance to statins with severe myopathy.  Recommended Zetia or PCSK9 inhibitors, patient declined to take any new medications -PT OT eval pending Addendum Got notified now that MRI could not be done until Medtronic is present.  Discussed with neurology, Dr Lorraine Lax, will evaluate from neurology perspective for recommendations.  Active Problems:   Mixed hyperlipidemia -LDL 137, goal less than 70, however patient  reports intolerance to statins and refuses any other lipid-lowering medications    Essential hypertension -BP currently stable    PACEMAKER-Medtronic -Per EDP note, interrogation of the patient's pacemaker revealed no events since the last interrogation    PAF (paroxysmal atrial fibrillation) ([place]) -Patient has been reporting 4-5 days of history of palpitations and had called cardiology office -EP consult called -Continue apixaban   Code Status: DNR DVT Prophylaxis:  apixaban Family Communication: Discussed in detail with the patient, all imaging results, lab results explained to the patient  Disposition Plan:   Time Spent in minutes   35 minutes  Procedures:    Consultants:   Cardiology  Antimicrobials:      Medications  Scheduled Meds: . allopurinol  100 mg Oral Daily  . apixaban  5 mg Oral BID  . cholecalciferol  2,000 Units Oral Daily  . guaiFENesin  600 mg Oral BID  . loratadine  10 mg Oral Daily  . propafenone  225 mg Oral TID  . sodium chloride flush  3 mL Intravenous Q12H   Continuous Infusions: PRN Meds:.LORazepam, sodium chloride   Antibiotics   Anti-infectives (From admission, onward)   None  Subjective:   [name] was seen and examined today.  Feels better this morning.  patient denies dizziness, chest pain, shortness of breath, abdominal pain, N/V/D/C. No acute events overnight.  No new weakness, numbness or tingling.  Objective:   Vitals:   12/02/17 0615 12/02/17 0640 12/02/17 0736 12/02/17 0953  BP:  (!) 154/87 (!) 122/105 (!) 146/85  Pulse:  80  81  Resp:  13 (!) 27   Temp:  98 F (36.7 C)    TempSrc:  Oral    SpO2: 94% 99%  93%  Weight:  73.4 kg (161 lb 12.8 oz)      Intake/Output Summary (Last 24 hours) at 12/02/2017 1141 Last data filed at 12/02/2017 1100 Gross per 24 hour  Intake 180 ml  Output 900 ml  Net -720 ml     Wt Readings from Last 3 Encounters:  12/02/17 73.4 kg (161 lb 12.8 oz)  11/08/17  73 kg (161 lb)  09/19/17 71.7 kg (158 lb)     Exam  General: Alert and oriented x 3, NAD  Eyes:  HEENT:  Atraumatic, normocephalic  Cardiovascular: S1 S2 auscultated, no rubs, murmurs or gallops. Regular rate and rhythm. Pacer in left chest  Respiratory: Clear to auscultation bilaterally, no wheezing, rales or rhonchi  Gastrointestinal: Soft, nontender, nondistended, + bowel sounds  Ext: no pedal edema bilaterally  Neuro: AAOx3, Cr N's II- XII. Strength 5/5 upper and lower extremities bilaterally, speech clear  Musculoskeletal: No digital cyanosis, clubbing  Skin: No rashes  Psych: Normal affect and demeanor, alert and oriented x3    Data Reviewed:  I have personally reviewed following labs and imaging studies  Micro Results No results found for this or any previous visit (from the past 240 hour(s)).  Radiology Reports Dg Chest 2 View  Result Date: 12/01/2017 CLINICAL DATA:  Chest pain and shortness of breath. Palpitations and dizziness. EXAM: CHEST  2 VIEW COMPARISON:  Radiographs 11/24/2016 FINDINGS: Left-sided pacemaker in place. The cardiomediastinal contours are unchanged with aortic atherosclerosis. Mild eventration of right hemidiaphragm. Pulmonary vasculature is normal. No consolidation, pleural effusion, or pneumothorax. No acute osseous abnormalities are seen. IMPRESSION: 1. No acute abnormality. 2. Aortic atherosclerosis. Electronically Signed   By: Jeb Levering M.D.   On: 12/01/2017 21:37   Ct Head Wo Contrast  Result Date: 12/01/2017 CLINICAL DATA:  Chest pain, palpitations, and dizziness. Shortness of breath. EXAM: CT HEAD WITHOUT CONTRAST TECHNIQUE: Contiguous axial images were obtained from the base of the skull through the vertex without intravenous contrast. COMPARISON:  06/07/2016 FINDINGS: Brain: Diffuse cerebral atrophy. Ventricular dilatation consistent with central atrophy. Low-attenuation changes throughout the deep white matter consistent with  small vessel ischemia. No mass-effect or midline shift. No abnormal extra-axial fluid collections. Gray-white matter junctions are distinct. Basal cisterns are not effaced. No acute intracranial hemorrhage. Vascular: Intracranial artery vascular calcifications are present. Skull: Calvarium is intact. Sinuses/Orbits: Paranasal sinuses and mastoid air cells are not opacified. Other: None. IMPRESSION: No acute intracranial abnormalities. Prominent chronic atrophy and small vessel ischemic changes. Electronically Signed   By: Lucienne Capers M.D.   On: 12/01/2017 23:13    Lab Data:  CBC: Recent Labs  Lab 12/01/17 2119 12/02/17 0322  WBC 6.4 5.6  HGB 13.6 13.3  HCT 41.5 39.0  MCV 97.9 98.2  PLT 141* 176*   Basic Metabolic Panel: Recent Labs  Lab 12/01/17 2119 12/02/17 0322  NA 135 135  K 3.9 3.5  CL 104 105  CO2 23 23  GLUCOSE 119* 140*  BUN 14 14  CREATININE 1.08* 1.01*  CALCIUM 8.9 9.1   GFR: Estimated Creatinine Clearance: 41 mL/min (A) (by C-G formula based on SCr of 1.01 mg/dL (H)). Liver Function Tests: No results for input(s): AST, ALT, ALKPHOS, BILITOT, PROT, ALBUMIN in the last 168 hours. No results for input(s): LIPASE, AMYLASE in the last 168 hours. No results for input(s): AMMONIA in the last 168 hours. Coagulation Profile: No results for input(s): INR, PROTIME in the last 168 hours. Cardiac Enzymes: No results for input(s): CKTOTAL, CKMB, CKMBINDEX, TROPONINI in the last 168 hours. BNP (last 3 results) No results for input(s): PROBNP in the last 8760 hours. HbA1C: Recent Labs    12/02/17 0322  HGBA1C 5.6   CBG: No results for input(s): GLUCAP in the last 168 hours. Lipid Profile: Recent Labs    12/02/17 0322  CHOL 219*  HDL 58  LDLCALC 137*  TRIG 119  CHOLHDL 3.8   Thyroid Function Tests: Recent Labs    12/02/17 0321  TSH 5.506*   Anemia Panel: No results for input(s): VITAMINB12, FOLATE, FERRITIN, TIBC, IRON, RETICCTPCT in the last 72  hours. Urine analysis:    Component Value Date/Time   COLORURINE YELLOW 06/29/2014 1421   APPEARANCEUR CLEAR 06/29/2014 1421   LABSPEC 1.011 06/29/2014 1421   PHURINE 5.0 06/29/2014 1421   GLUCOSEU NEGATIVE 06/29/2014 1421   HGBUR NEGATIVE 06/29/2014 1421   BILIRUBINUR negative 04/24/2017 1722   KETONESUR negative 04/24/2017 1722   KETONESUR NEGATIVE 06/29/2014 1421   PROTEINUR negative 04/24/2017 1722   PROTEINUR NEGATIVE 06/29/2014 1421   UROBILINOGEN 0.2 04/24/2017 1722   UROBILINOGEN 0.2 06/29/2014 1421   NITRITE Negative 04/24/2017 1722   NITRITE NEGATIVE 06/29/2014 1421   LEUKOCYTESUR Negative 04/24/2017 1722     Aarib Pulido M.D. [facility] 12/02/2017, 11:41 AM  Pager: [phone] Between 7am to 7pm - call Pager - [phone]  After 7pm go to [email or web address] - password [email or web address]  Call night coverage person covering after 7pm

## 2017-12-02 NOTE — ED Notes (Signed)
Admitting MD at bedside.

## 2017-12-02 NOTE — Consult Note (Signed)
ELECTROPHYSIOLOGY CONSULT NOTE  Patient ID: [name] [name], MRN: [id number], DOB/AGE: 08/26/1932 84 y.o. Admit date: 12/01/2017 Date of Consult: 12/02/2017  Primary Physician: Lajean Manes, MD Primary Cardiologist: [name] [name] is a 84 y.o. female who is being seen today for the evaluation of palpiations and imbalance  at the request of Dr Tana Coast.   Chief Complaint: atrial fibrillaton   HPI [name] [name] is a 84 y.o. female  Admitted with more palpitations and worsening balance and poor coordination manifested with walking and playing piano   Long history of AFib dating back unto at least 2006 when she was paced for tachy brady syndrome and since then hs been on propafenone.  Anticoagulation with apixoban   Last weeks with increasing episodes of afib confirmed by monitoring.  Over the last week she continued to have more palpitations.  We tried to increase her propafenone 225 BID>>tid    She presented as above.  Coordination and balance issues are in the last 24 hours prior to admission.  DATE PR QRSduration Dose  2006   Started   2/12  128 225 bid  9/16  128 225 bid  2/17  126 225 bid  12/28  126 225 bid  1/19  160 225 tid     Past Medical History:  Diagnosis Date  . Atrial fibrillation ([place])   . CVA    Lacunar and cerebellar infarct  . Depression   . DVT (deep venous thrombosis) ([place])    Details not available in the chart  . Edema   . Mitral valve regurgitation    a. echo 2009 mild to moderate MR  . Sinoatrial node dysfunction ([facility])   . Unspecified essential hypertension       Surgical History:  Past Surgical History:  Procedure Laterality Date  . PACEMAKER INSERTION  09-14-2005   MDT EnRhythm dual chamber pacemaker implanted by Dr Lovena Le for SND  . PPM GENERATOR CHANGEOUT N/A 05/07/2017   Procedure: PPM Generator Changeout;  Surgeon: Deboraha Sprang, MD;  Location: [facility];  Service: Cardiovascular;  Laterality: N/A;      Home Meds: Prior to Admission medications   Medication Sig Start Date End Date Taking? Authorizing Provider  acetaminophen (TYLENOL) 650 MG CR tablet Take 650 mg by mouth every 8 (eight) hours as needed for pain.   Yes [provider]  allopurinol (ZYLOPRIM) 100 MG tablet Take 100 mg by mouth daily.   Yes [provider]  apixaban (ELIQUIS) 5 MG TABS tablet Take 1 tablet (5 mg total) by mouth 2 (two) times daily. 08/08/17  Yes Josue Hector, MD  Apoaequorin (PREVAGEN) 10 MG CAPS Take 10 mg by mouth daily.    Yes [provider]  Cholecalciferol (VITAMIN D3) 2000 UNITS TABS Take 2,000 Units by mouth daily.    Yes [provider]  desonide (DESOWEN) 0.05 % ointment Apply 1 application topically 2 (two) times daily as needed (dry skin).   Yes [provider]  diltiazem (CARDIZEM) 30 MG tablet Take 1 tablet (30 mg total) by mouth daily as needed. FOR PALPITATIONS Patient taking differently: Take 30-60 mg by mouth daily as needed (for palpitations).  03/28/17  Yes Josue Hector, MD  folic acid (FOLVITE) 1 MG tablet Take 1 mg by mouth once a week.    Yes [provider]  LORazepam (ATIVAN) 0.5 MG tablet Take 0.5 mg by mouth at bedtime as needed for sleep.    Yes  [provider]  Polyvinyl Alcohol-Povidone (REFRESH OP) Apply 1 drop to eye 2 (two) times daily as needed (dry eyes).   Yes [provider]  propafenone (RYTHMOL) 225 MG tablet Take 1 tablet (225 mg total) by mouth 3 (three) times daily. 01/24/17  Yes Deboraha Sprang, MD  sodium chloride (OCEAN) 0.65 % SOLN nasal spray Place 1 spray into both nostrils as needed for congestion.   Yes [provider]    Inpatient Medications:  . allopurinol  100 mg Oral Daily  . apixaban  5 mg Oral BID  . cholecalciferol  2,000 Units Oral Daily  . guaiFENesin  600 mg Oral BID  . loratadine  10 mg Oral Daily  . propafenone  225 mg Oral TID  . sodium chloride flush  3 mL  Intravenous Q12H     Allergies:  Allergies  Allergen Reactions  . Gluten Meal Diarrhea  . Itraconazole Diarrhea and Itching  . Lactose Intolerance (Gi) Diarrhea and Nausea Only    Pt unsure  . Statins     Severe Muscle ache,can't tolerate     Social History   Socioeconomic History  . Marital status: Widowed    Spouse name: Not on file  . Number of children: Not on file  . Years of education: Not on file  . Highest education level: Not on file  Social Needs  . Financial resource strain: Not on file  . Food insecurity - worry: Not on file  . Food insecurity - inability: Not on file  . Transportation needs - medical: Not on file  . Transportation needs - non-medical: Not on file  Occupational History  . Not on file  Tobacco Use  . Smoking status: Former Smoker    Last attempt to quit: 08/13/1984    Years since quitting: 33.3  . Smokeless tobacco: Never Used  Substance and Sexual Activity  . Alcohol use: Yes    Alcohol/week: 8.4 oz    Types: 14 Glasses of wine per week  . Drug use: No  . Sexual activity: Not Currently  Other Topics Concern  . Not on file  Social History Narrative  . Not on file     Family History  Problem Relation Age of Onset  . Cancer Mother   . Mitral valve prolapse Mother   . Diabetes Father   . Hypertension Father   . Coronary artery disease Sister   . Stroke Brother   . Coronary artery disease Sister      ROS:  Please see the history of present illness.     All other systems reviewed and negative.    Physical Exam: Blood pressure (!) 146/85, pulse 81, temperature 98 F (36.7 C), temperature source Oral, resp. rate (!) 27, weight 161 lb 12.8 oz (73.4 kg), SpO2 93 %. General: Well developed, well nourished female in no acute distress. Head: Normocephalic, atraumatic, sclera non-icteric, no xanthomas, nares are without discharge. EENT: normal Lymph Nodes:  none Back: without scoliosis/kyphosis , no CVA tendersness Neck: Negative for  carotid bruits. JVD not elevated. Lungs: Clear bilaterally to auscultation without wheezes, rales, or rhonchi. Breathing is unlabored. Heart: RRR with S1 S2. No murmur , rubs, or gallops appreciated. Abdomen: Soft, non-tender, non-distended with normoactive bowel sounds. No hepatomegaly. No rebound/guarding. No obvious abdominal masses. Msk:  Strength and tone appear normal for age. Extremities: No clubbing or cyanosis. No edema.  Distal pedal pulses are 2+ and equal bilaterally. Skin: Warm and Dry Neuro: Alert and oriented  X 3. CN III-XII intact Grossly normal sensory and motor function . Psych:  Responds to questions appropriately with a normal affect.      Labs: Cardiac Enzymes No results for input(s): CKTOTAL, CKMB, TROPONINI in the last 72 hours.   Hematology Recent Labs  Lab 12/01/17 2119 12/02/17 0322  WBC 6.4 5.6  RBC 4.24 3.97  HGB 13.6 13.3  HCT 41.5 39.0  MCV 97.9 98.2  MCH 32.1 33.5  MCHC 32.8 34.1  RDW 14.4 14.6  PLT 141* 128*   PROTIME: No results for input(s): LABPROT, INR in the last 72 hours. Chemistry   Recent Labs  Lab 12/01/17 2119 12/02/17 0322  NA 135 135  K 3.9 3.5  CL 104 105  CO2 23 23  GLUCOSE 119* 140*  BUN 14 14  CREATININE 1.08* 1.01*  CALCIUM 8.9 9.1  GFRNONAA 46* 50*  GFRAA 53* 58*  ANIONGAP 8 7   Lipids Lab Results  Component Value Date   CHOL 219 (H) 12/02/2017   HDL 58 12/02/2017   LDLCALC 137 (H) 12/02/2017   TRIG 119 12/02/2017   BNP No results found for: PROBNP Thyroid Function Tests: Recent Labs    12/02/17 0321  TSH 5.506*   T4 normal   Miscellaneous No results found for: DDIMER  Radiology/Studies:  Dg Chest 2 View  Result Date: 12/01/2017 CLINICAL DATA:  Chest pain and shortness of breath. Palpitations and dizziness. EXAM: CHEST  2 VIEW COMPARISON:  Radiographs 11/24/2016 FINDINGS: Left-sided pacemaker in place. The cardiomediastinal contours are unchanged with aortic atherosclerosis. Mild eventration of  right hemidiaphragm. Pulmonary vasculature is normal. No consolidation, pleural effusion, or pneumothorax. No acute osseous abnormalities are seen. IMPRESSION: 1. No acute abnormality. 2. Aortic atherosclerosis. Electronically Signed   By: Jeb Levering M.D.   On: 12/01/2017 21:37   Ct Head Wo Contrast  Result Date: 12/01/2017 CLINICAL DATA:  Chest pain, palpitations, and dizziness. Shortness of breath. EXAM: CT HEAD WITHOUT CONTRAST TECHNIQUE: Contiguous axial images were obtained from the base of the skull through the vertex without intravenous contrast. COMPARISON:  06/07/2016 FINDINGS: Brain: Diffuse cerebral atrophy. Ventricular dilatation consistent with central atrophy. Low-attenuation changes throughout the deep white matter consistent with small vessel ischemia. No mass-effect or midline shift. No abnormal extra-axial fluid collections. Gray-white matter junctions are distinct. Basal cisterns are not effaced. No acute intracranial hemorrhage. Vascular: Intracranial artery vascular calcifications are present. Skull: Calvarium is intact. Sinuses/Orbits: Paranasal sinuses and mastoid air cells are not opacified. Other: None. IMPRESSION: No acute intracranial abnormalities. Prominent chronic atrophy and small vessel ischemic changes. Electronically Signed   By: Lucienne Capers M.D.   On: 12/01/2017 23:13    EKG: Sinus with a QRS duration of 162 ms  Telemetry Personally reviewed  No afib but intermittently frequent PVC   Assessment and Plan:   Atrial fibrillation-paroxysmal-increasingly frequent  PVCs-probably new  Propafenone neuro and cardiac toxicity  Tachybradycardia syndrome  Pacemaker-[employer]  Hypertension  She had noted an increasing burden of atrial fibrillation confirmed by pacemaker interrogation; interestingly, however, the atrial fibrillation has not been present since 12/31.  On telemetry monitoring today there were episodes of frequent PVCs interrupted by long  stretches with no PVCs.  I wonder whether the palpitations of the last week have been caused by the PVCs.  Potassium was normal on arrival.  We will check a magnesium and begin mag oxide supplementation  We have discussed alternative rhythm control and strategies.  These would include resuming the propafenone at twice daily  doses, flecainide, dofetilide or sotalol, amiodarone or dronaderone.  I am not a big fan of the latter.  She is not a fan of amiodarone.  She is not inclined to stay in hospital for drug initiation at this time.   she recalls experience with flecainide prior to the EMR records (2006) when she was hospitalized to initiate flecainide.  This was associated with bradycardia but also in her mind a flurry of atrial fibrillation.  Hence, she is not interested in flecainide.  Thus we had landed on resuming propafenone twice daily what is a half-life of the propafenone is 2-10 hours.  By tomorrow she should be back near a acceptable range and will resume the drug.  I will look at her ECG in the morning a QRS duration prior to initiation.  I think that her neurological symptoms also represent propafenone induced neurotoxicity given the temporal relationship to the up titration from 2-3 pills a day.  She is already feeling some better.  [name] [name]

## 2017-12-02 NOTE — H&P (Signed)
History and Physical   [name] [name]:[id number] DOB: 1932-08-29 DOA: 12/01/2017  PCP: Lajean Manes, MD  Chief Complaint: palpitations, edema  HPI:  This is an 84 year old woman with atrial fibrillation on propafenone and Eliquis, Reported history of radiographic evidence of CVA, anxiety/depression presenting with palpitations, lower extremity edema, and other subtle self-perceived neurological changes.  She lives alone in [place] in a retirement community, takes care of a poodle, former smoker, drinks 2 glasses of wine a night.  Normally active, does not require any assistance with her ADLs or IADLs. She is followed closely by cardiology who manages her atrial fibrillation. She reports that she has been having a challenging time remembering to take her Eliquis and propafenone as prescribed. She reports 4-5 days ago having palpitations, at the time associated with some chest discomfort. She also reports lower extremity edema, that has since resolved.  She reports she has noticed having some weakness to her legs, and has been somewhat off balance. She noticed that when playing the piano she missed multiple notes and seemed to have some difficulty coordinating her visual input to guide her hands. Due to these symptoms she wanted to seek medical attention prompting her to come to the emergency department.   ED Course: The emergency department heart rate normal, EKG revealing sinus rhythm, right bundle branch block, QTC of 541 msec.  troponin normal, BNP within normal limits, CBC normal with exception of mild thrombocytopenia platelet count 141,000. BMP with creatinine of 1.08. Chest x-ray without acute abnormality.  CT head wo contrast without acute changes. Emergency medicine team ordered MRI to further evaluate TIA/CVA possibility, medicine team consulted further management.  Review of Systems: A complete ROS was obtained; pertinent positives negatives are denoted in the HPI. Otherwise,  all systems are negative.   Past Medical History:  Diagnosis Date  . Atrial fibrillation ([place])   . CVA    Lacunar and cerebellar infarct  . Depression   . DVT (deep venous thrombosis) ([place])    Details not available in the chart  . Edema   . Mitral valve regurgitation    a. echo 2009 mild to moderate MR  . Sinoatrial node dysfunction ([facility])   . Unspecified essential hypertension    Contraction to above, she denies ever having a DVT.  Social History   Socioeconomic History  . Marital status: Widowed    Spouse name: Not on file  . Number of children: Not on file  . Years of education: Not on file  . Highest education level: Not on file  Social Needs  . Financial resource strain: Not on file  . Food insecurity - worry: Not on file  . Food insecurity - inability: Not on file  . Transportation needs - medical: Not on file  . Transportation needs - non-medical: Not on file  Occupational History  . Not on file  Tobacco Use  . Smoking status: Former Smoker    Last attempt to quit: 08/13/1984    Years since quitting: 33.3  . Smokeless tobacco: Never Used  Substance and Sexual Activity  . Alcohol use: Yes    Alcohol/week: 8.4 oz    Types: 14 Glasses of wine per week  . Drug use: No  . Sexual activity: Not Currently  Other Topics Concern  . Not on file  Social History Narrative  . Not on file   Family History  Problem Relation Age of Onset  . Cancer Mother   . Mitral valve prolapse Mother   .  Diabetes Father   . Hypertension Father   . Coronary artery disease Sister   . Stroke Brother   . Coronary artery disease Sister     Physical Exam: Vitals:   12/01/17 2220 12/01/17 2300 12/01/17 2315 12/02/17 0000  BP:   124/86 138/75  Pulse: 64     Resp: (!) 24 18 18 13   Temp:      TempSrc:      SpO2: 99%      General: Appears mildly anxious, otherwise well appearing white woman ENT: Grossly normal hearing, MMM. Cardiovascular: RRR. No M/R/G. No LE edema.  Left chest  pacer present. Respiratory: CTA bilaterally. No wheezes or crackles. Normal respiratory effort. Abdomen: Soft, non-tender. Bowel sounds present.  Skin: No rash or induration seen on limited exam; varicosities present bilateral lower extremities. Musculoskeletal: Grossly normal tone BUE/BLE. Appropriate ROM.  Psychiatric: Grossly normal mood and affect. Neurologic: Moves all extremities in coordinated fashion.  Cranial nerves 2-12 WNL.  Finger to nose and heel to shin normal.  Stands without assistance.  Limited view of gait assessed at bedside without frank abnormalities.  No pronator drift.  Gross sensation to touch intact x 4 extremities.  I have personally reviewed the following labs, culture data, and imaging studies.  Assessment/Plan:  TIA, possible The patient reports having episode of palpitations c/w her atrial fibrillation; shortly thereafter, had some self reported dysmetria while playing the piano and unsteadiness upon walking; no frank neurological deficits appreciated on my exam on admission.  Possible predisposing factors include medication non-adherence; possibly in a window of time where she missed a dose of her DOAC. Plan: *follow up MRI ordered by emergency medicine team, telemetry monitoring, risk stratification labs with TSH, lipid profile, and A1c. If new CVA present on imaging, consider neurology consultation and further work up (TTE, neck vessel imaging).  Other problems -AF: continue with propafenone as prescribed by cardiology - consider consult in the AM, continue DOAC for stroke prevention in AF -Anxiety / depression: continue SSRI  DVT prophylaxis: on AC with DOAC Code Status: DNR / DNI after discussion on admission Disposition Plan: Anticipate D/C home in 24-48 hrs Consults called: none Admission status: admit to hospital medicine, observation status   Cheri Rous, MD [facility] Page:[phone]  If 7PM-7AM, please contact  night-coverage [email or web address] Password [email or web address]

## 2017-12-02 NOTE — Progress Notes (Signed)
PHARMACIST - PHYSICIAN ORDER COMMUNICATION  CONCERNING: P&T Medication Policy on Herbal Medications  DESCRIPTION:  This patient's order for:  Apoaequorin  has been noted.  This product(s) is classified as an "herbal" or natural product. Due to a lack of definitive safety studies or FDA approval, nonstandard manufacturing practices, plus the potential risk of unknown drug-drug interactions while on inpatient medications, the Pharmacy and Therapeutics Committee does not permit the use of "herbal" or natural products of this type within [place].   ACTION TAKEN: The pharmacy department is unable to verify this order at this time and your patient has been informed of this safety policy. Please reevaluate patient's clinical condition at discharge and address if the herbal or natural product(s) should be resumed at that time.  

## 2017-12-02 NOTE — ED Notes (Signed)
Pt transferred to hospital bed

## 2017-12-02 NOTE — Progress Notes (Signed)
  Echocardiogram 2D Echocardiogram has been performed.  [name] [name] 12/02/2017, 9:24 AM

## 2017-12-02 NOTE — Progress Notes (Signed)
VASCULAR LAB PRELIMINARY  PRELIMINARY  PRELIMINARY  PRELIMINARY  Carotid duplex completed.    Preliminary report:  1-39% ICA plaquing. Vertebral artery flow is antegrade.   Brentton Wardlow, RVT 12/02/2017, 9:12 AM

## 2017-12-03 LAB — T3: T3, Total: 97 ng/dL (ref 71–180)

## 2017-12-03 MED ORDER — PROPAFENONE HCL 225 MG PO TABS: 225.0000 mg | ORAL_TABLET | Freq: Two times a day (BID) | ORAL | 2 refills | Status: DC

## 2017-12-03 MED ORDER — LORATADINE 10 MG PO TABS: 10.0000 mg | ORAL_TABLET | Freq: Every day | ORAL | 3 refills | Status: AC

## 2017-12-03 MED ORDER — GUAIFENESIN ER 600 MG PO TB12: 600.0000 mg | ORAL_TABLET | Freq: Two times a day (BID) | ORAL | 0 refills | Status: DC | PRN

## 2017-12-03 NOTE — Plan of Care (Signed)
  Clinical Measurements: Ability to maintain clinical measurements within normal limits will improve. NIH 0. No changes in pt's mental status from first assessment. Pt A&O x4, follows commands, ambulates to bathroom with minimal assistance, answers questions appropriately . Will continue to monitor pt this shift.  12/03/2017 0601 - Progressing by Mellissa Kohut, RN

## 2017-12-03 NOTE — Consult Note (Signed)
NEURO HOSPITALIST CONSULT NOTE   Requestig physician: Dr. Tana Coast  Reason for Consult: Possible stroke  History obtained from:  Patient and Chart    HPI:                                                                                                                                          [name] [name] is an 84 y.o. female with atrial fibrillation, on Eliquis who presented with weakness to her bilateral lower extremities and feeling of being off balance. Of note, she has had some lower extremity edema. However, she also noticed that while playing the piano she missed multiple notes and had some difficulty coordinating her visual input to guide her hands. Other symptoms endorsed by the patient include 4-5 days of palpitations with some chest discomfort.   Her PMHx also includes stroke (lacunar and cerebellar infarct), DVT, mitral valve regurgitation, HTN and pacemaker.   CT head was negative for acute abnormality. Prominent chronic atrophy and small vessel ischemic changes were noted.   Carotid ultrasound: Exam completed with report pending.   EKG: Sinus rhythm with conduction defects.   TTE: Study Conclusions - Left ventricle: The cavity size was normal. Systolic function was   normal. The estimated ejection fraction was in the range of 55%   to 60%. Wall motion was normal; there were no regional wall   motion abnormalities. - Aortic valve: There was mild regurgitation. - Mitral valve: Mildly calcified annulus. - Left atrium: The atrium was mildly dilated. - Pulmonary arteries: Systolic pressure was moderately increased.   PA peak pressure: 48 mm Hg (S).  Past Medical History:  Diagnosis Date  . Atrial fibrillation ([facility])   . CVA    Lacunar and cerebellar infarct  . Depression   . DVT (deep venous thrombosis) ([facility])    Details not available in the chart  . Edema   . Mitral valve regurgitation    a. echo 2009 mild to moderate MR  . Sinoatrial node  dysfunction ([facility])   . Unspecified essential hypertension     Past Surgical History:  Procedure Laterality Date  . PACEMAKER INSERTION  09-14-2005   MDT EnRhythm dual chamber pacemaker implanted by Dr Lovena Le for SND  . PPM GENERATOR CHANGEOUT N/A 05/07/2017   Procedure: PPM Generator Changeout;  Surgeon: Deboraha Sprang, MD;  Location: [facility];  Service: Cardiovascular;  Laterality: N/A;    Family History  Problem Relation Age of Onset  . Cancer Mother   . Mitral valve prolapse Mother   . Diabetes Father   . Hypertension Father   . Coronary artery disease Sister   . Stroke Brother   . Coronary artery disease Sister    Social History:  reports that she quit smoking about 36  years ago. she has never used smokeless tobacco. She reports that she drinks about 8.4 oz of alcohol per week. She reports that she does not use drugs.  Allergies  Allergen Reactions  . Gluten Meal Diarrhea  . Itraconazole Diarrhea and Itching  . Lactose Intolerance (Gi) Diarrhea and Nausea Only    Pt unsure  . Statins     Severe Muscle ache,can't tolerate     MEDICATIONS:                                                                                                                     Prior to Admission:  Medications Prior to Admission  Medication Sig Dispense Refill Last Dose  . acetaminophen (TYLENOL) 650 MG CR tablet Take 650 mg by mouth every 8 (eight) hours as needed for pain.   unk  . allopurinol (ZYLOPRIM) 100 MG tablet Take 100 mg by mouth daily.   12/01/2017 at Unknown time  . apixaban (ELIQUIS) 5 MG TABS tablet Take 1 tablet (5 mg total) by mouth 2 (two) times daily. 60 tablet 10 12/01/2017 at 1330  . Apoaequorin (PREVAGEN) 10 MG CAPS Take 10 mg by mouth daily.    12/01/2017 at Unknown time  . Cholecalciferol (VITAMIN D3) 2000 UNITS TABS Take 2,000 Units by mouth daily.    Past Week at Unknown time  . desonide (DESOWEN) 0.05 % ointment Apply 1 application topically 2 (two) times daily as  needed (dry skin).   unk  . diltiazem (CARDIZEM) 30 MG tablet Take 1 tablet (30 mg total) by mouth daily as needed. FOR PALPITATIONS (Patient taking differently: Take 30-60 mg by mouth daily as needed (for palpitations). ) 45 tablet 6 unk  . folic acid (FOLVITE) 1 MG tablet Take 1 mg by mouth once a week.    Past Month at Unknown time  . LORazepam (ATIVAN) 0.5 MG tablet Take 0.5 mg by mouth at bedtime as needed for sleep.    unk  . Polyvinyl Alcohol-Povidone (REFRESH OP) Apply 1 drop to eye 2 (two) times daily as needed (dry eyes).   unk  . propafenone (RYTHMOL) 225 MG tablet Take 1 tablet (225 mg total) by mouth 3 (three) times daily. 270 tablet 2 12/01/2017 at Unknown time  . sodium chloride (OCEAN) 0.65 % SOLN nasal spray Place 1 spray into both nostrils as needed for congestion.   unk   Scheduled: . allopurinol  100 mg Oral Daily  . apixaban  5 mg Oral BID  . cholecalciferol  2,000 Units Oral Daily  . guaiFENesin  600 mg Oral BID  . loratadine  10 mg Oral Daily  . sodium chloride flush  3 mL Intravenous Q12H   Continuous:  VFI:EPPIRJJOACZYS, LORazepam, simethicone, sodium chloride  ROS:  Denies current weakness, sensory changes or facial droop. Other ROS as per HPI.   Blood pressure (!) 142/65, pulse 62, temperature 97.7 F (36.5 C), temperature source Oral, resp. rate 13, weight 73.4 kg (161 lb 12.8 oz), SpO2 96 %.  General Examination:                                                                                                      HEENT-  [place]/AT    Lungs- Respirations unlabored Extremities- Warm and well perfused  Neurological Examination Mental Status: Alert, oriented, thought content appropriate.  Speech fluent without evidence of aphasia.  Able to follow all commands without difficulty. Cranial Nerves: II: Visual fields intact without  extinction to DSS. Fixates and tracks normally.  III,IV, VI: EOMI without nystagmus. No ptosis.  V,VII: Smile slightly asymmetric, but face contracts equally with smile. Patient viewed face in mirror stating that it appears normal. Facial temp sensation normal bilaterally VIII: hearing intact to voice IX,X: No hypophonia or hoarseness XI: Symmetric shoulder shrug XII: Midline tongue extension Motor: Right : Upper extremity   5/5    Left:     Upper extremity   5/5  Lower extremity   5/5     Lower extremity   5/5 Sensory: Temp and light touch intact proximally x 4. No extinction.  Deep Tendon Reflexes: 2+ and symmetric throughout, no asymmetry noted Cerebellar: No ataxia with FNF bilaterally Gait: Able to stand with own power. Gait with mild unsteadiness. Able to walk without assistance. Gait is narrow based without ataxia.   Lab Results: Basic Metabolic Panel: Recent Labs  Lab 12/01/17 2119 12/02/17 0322  NA 135 135  K 3.9 3.5  CL 104 105  CO2 23 23  GLUCOSE 119* 140*  BUN 14 14  CREATININE 1.08* 1.01*  CALCIUM 8.9 9.1    Liver Function Tests: No results for input(s): AST, ALT, ALKPHOS, BILITOT, PROT, ALBUMIN in the last 168 hours. No results for input(s): LIPASE, AMYLASE in the last 168 hours. No results for input(s): AMMONIA in the last 168 hours.  CBC: Recent Labs  Lab 12/01/17 2119 12/02/17 0322  WBC 6.4 5.6  HGB 13.6 13.3  HCT 41.5 39.0  MCV 97.9 98.2  PLT 141* 128*    Cardiac Enzymes: No results for input(s): CKTOTAL, CKMB, CKMBINDEX, TROPONINI in the last 168 hours.  Lipid Panel: Recent Labs  Lab 12/02/17 0322  CHOL 219*  TRIG 119  HDL 58  CHOLHDL 3.8  VLDL 24  LDLCALC 137*    CBG: No results for input(s): GLUCAP in the last 168 hours.  Microbiology: Results for orders placed or performed in visit on 10/24/17  Urine Culture     Status: Abnormal   Collection Time: 10/24/17  5:20 PM  Result Value Ref Range Status   Specimen Description URINE,  RANDOM  Final   Special Requests NONE  Final   Culture 70,000 COLONIES/mL ESCHERICHIA COLI (A)  Final   Report Status 10/26/2017 FINAL  Final   Organism ID, Bacteria ESCHERICHIA COLI (A)  Final  Susceptibility   Escherichia coli - MIC*    AMPICILLIN <=2 SENSITIVE Sensitive     CEFAZOLIN <=4 SENSITIVE Sensitive     CEFTRIAXONE <=1 SENSITIVE Sensitive     CIPROFLOXACIN <=0.25 SENSITIVE Sensitive     GENTAMICIN <=1 SENSITIVE Sensitive     IMIPENEM <=0.25 SENSITIVE Sensitive     NITROFURANTOIN <=16 SENSITIVE Sensitive     TRIMETH/SULFA <=20 SENSITIVE Sensitive     AMPICILLIN/SULBACTAM <=2 SENSITIVE Sensitive     PIP/TAZO <=4 SENSITIVE Sensitive     Extended ESBL NEGATIVE Sensitive     * 70,000 COLONIES/mL ESCHERICHIA COLI    Coagulation Studies: No results for input(s): LABPROT, INR in the last 72 hours.  Imaging: Dg Chest 2 View  Result Date: 12/01/2017 CLINICAL DATA:  Chest pain and shortness of breath. Palpitations and dizziness. EXAM: CHEST  2 VIEW COMPARISON:  Radiographs 11/24/2016 FINDINGS: Left-sided pacemaker in place. The cardiomediastinal contours are unchanged with aortic atherosclerosis. Mild eventration of right hemidiaphragm. Pulmonary vasculature is normal. No consolidation, pleural effusion, or pneumothorax. No acute osseous abnormalities are seen. IMPRESSION: 1. No acute abnormality. 2. Aortic atherosclerosis. Electronically Signed   By: Jeb Levering M.D.   On: 12/01/2017 21:37   Ct Head Wo Contrast  Result Date: 12/01/2017 CLINICAL DATA:  Chest pain, palpitations, and dizziness. Shortness of breath. EXAM: CT HEAD WITHOUT CONTRAST TECHNIQUE: Contiguous axial images were obtained from the base of the skull through the vertex without intravenous contrast. COMPARISON:  06/07/2016 FINDINGS: Brain: Diffuse cerebral atrophy. Ventricular dilatation consistent with central atrophy. Low-attenuation changes throughout the deep white matter consistent with small vessel  ischemia. No mass-effect or midline shift. No abnormal extra-axial fluid collections. Gray-white matter junctions are distinct. Basal cisterns are not effaced. No acute intracranial hemorrhage. Vascular: Intracranial artery vascular calcifications are present. Skull: Calvarium is intact. Sinuses/Orbits: Paranasal sinuses and mastoid air cells are not opacified. Other: None. IMPRESSION: No acute intracranial abnormalities. Prominent chronic atrophy and small vessel ischemic changes. Electronically Signed   By: Lucienne Capers M.D.   On: 12/01/2017 23:13    Assessment: 84 year old female with symptoms of decreased motor coordination  1. Neurological exam is nonfocal. However, a subtle lesion could be missed. MRI is the modality of choice to rule out new stroke. Given her complaint of incoordination, DDx includes cerebellar stroke or multifocal cerebral infarctions given her history of atrial fibrillation 2. Allergic to statins.  3. Stroke risk factors: Atrial fibrillatoin, prior strokes, history of DVT, HTN.  Recommendations: 1. Continue DOAC. 2. MRI and MRA of brain with assistance of pacemaker company rep 3. Unable to treat with statin 4. PT/OT/Speech  Electronically signed: Dr. Kerney Elbe 12/03/2017, 1:35 AM

## 2017-12-03 NOTE — Progress Notes (Signed)
Discharge summary reviewed with pt with verbalization of understanding. All questions addressed and pt expresses no concerns at this time. No change since previous assessment. PIV removed. Pt discharged to lobby via wheelchair and Nurse Tech.

## 2017-12-03 NOTE — Discharge Summary (Signed)
Physician Discharge Summary   Patient ID: [name] [name] MRN: [id number] DOB/AGE: 08/04/1932 84 y.o.  Admit date: 12/01/2017 Discharge date: 12/03/2017  Primary Care Physician:  Lajean Manes, MD  Discharge Diagnoses:    . Palpitations . Essential hypertension . Mixed hyperlipidemia . PAF (paroxysmal atrial fibrillation) ([facility]) . PACEMAKER-[employer] . neurological symptoms likely due to propafenone induced neurotoxicity    Consults:   Cardiology  neurology  Recommendations for Outpatient Follow-up:  1. Please repeat CBC/BMET at next visit 2. Propafenone decreased to BID    DIET: Heart healthy diet    Allergies:   Allergies  Allergen Reactions  . Gluten Meal Diarrhea  . Itraconazole Diarrhea and Itching  . Lactose Intolerance (Gi) Diarrhea and Nausea Only    Pt unsure  . Statins     Severe Muscle ache,can't tolerate      DISCHARGE MEDICATIONS: Allergies as of 12/03/2017      Reactions   Gluten Meal Diarrhea   Itraconazole Diarrhea, Itching   Lactose Intolerance (gi) Diarrhea, Nausea Only   Pt unsure   Statins    Severe Muscle ache,can't tolerate       Medication List    TAKE these medications   acetaminophen 650 MG CR tablet Commonly known as:  TYLENOL Take 650 mg by mouth every 8 (eight) hours as needed for pain.   allopurinol 100 MG tablet Commonly known as:  ZYLOPRIM Take 100 mg by mouth daily.   apixaban 5 MG Tabs tablet Commonly known as:  ELIQUIS Take 1 tablet (5 mg total) by mouth 2 (two) times daily.   desonide 0.05 % ointment Commonly known as:  DESOWEN Apply 1 application topically 2 (two) times daily as needed (dry skin).   diltiazem 30 MG tablet Commonly known as:  CARDIZEM Take 1 tablet (30 mg total) by mouth daily as needed. FOR PALPITATIONS What changed:    how much to take  reasons to take this  additional instructions   folic acid 1 MG tablet Commonly known as:  FOLVITE Take 1 mg by mouth once a week. Notes to  patient:  Resume taking as scheduled prior to admission    guaiFENesin 600 MG 12 hr tablet Commonly known as:  MUCINEX Take 1 tablet (600 mg total) by mouth 2 (two) times daily as needed for cough or to loosen phlegm.   loratadine 10 MG tablet Commonly known as:  CLARITIN Take 1 tablet (10 mg total) by mouth daily. For allergies, congestion   LORazepam 0.5 MG tablet Commonly known as:  ATIVAN Take 0.5 mg by mouth at bedtime as needed for sleep.   PREVAGEN 10 MG Caps Generic drug:  Apoaequorin Take 10 mg by mouth daily.   propafenone 225 MG tablet Commonly known as:  RYTHMOL Take 1 tablet (225 mg total) by mouth 2 (two) times daily. What changed:  when to take this   REFRESH OP Apply 1 drop to eye 2 (two) times daily as needed (dry eyes).   sodium chloride 0.65 % Soln nasal spray Commonly known as:  OCEAN Place 1 spray into both nostrils as needed for congestion.   Vitamin D3 2000 units Tabs Take 2,000 Units by mouth daily.        Brief H and P: For complete details please refer to admission H and P, but in brief Patient is a 84 year old female with atrial fib on propafenoneand Eliquis,CVA, anxiety presented with palpitations, lower extremity edema and some weakness to her legs, off balance, feeling difficult coordinating.  Patient reported 4-5 days history of palpitations with some chest discomfort and had called her cardiologist office.  She also reported lower extremity edema which has resolved.  She reported some weakness to her legs and being off balance.  She noticed that while playing the piano she missed multiple notes and had some difficulty coordinating her visual input to guide her hands.  Hospital Course:   Neurological symptoms possibly due to propafenone toxicity -Patient presented with weakness in her legs, gait instability, dysmetria, symptoms now improving after Rythmol/propafenone dose decreased.   -There was concern of possible TIA however patient could  not undergo MRI/MRA because of pacemaker. -EP cardiology was consulted, patient was seen by Dr. Caryl Comes.  On telemetry monitoring, patient had episodes of frequent PVCs interpreted by long stretches of no PVCs.  Possible palpitations due to PVCs.  Patient's propafenone had been increased outpatient to 3 times daily, Dr. Caryl Comes felt that her neurological symptoms also represented propafenone-induced neurotoxicity given the temporal relationship of up titration from 2 to 3 pills a day.  QRS much improved at the time of discharge, from 180 to 135. -Propafenone was decreased to 225mg  BID, patient symptoms had significantly improved, ambulating in the hallway without any difficulty.  She was cleared to discharge from cardiology standpoint and outpatient follow-up in 2 weeks. -Continue apixaban -2D echo showed EF of 55-60%, normal wall motion.    Mixed hyperlipidemia -LDL 137, goal less than 70, however patient reports intolerance to statins and refuses any other lipid-lowering medications    Essential hypertension -BP currently stable    PACEMAKER-[employer] -Per EDP note, interrogation of the patient's pacemaker revealed no events since the last interrogation    PAF (paroxysmal atrial fibrillation) ([place]) -Patient has been reporting 4-5 days of history of palpitations and had called cardiology office -See #1, Rythmol decreased to twice a day, continue apixaban     Day of Discharge BP (!) 142/85 (BP Location: Right Arm)   Pulse 60   Temp 98.8 F (37.1 C) (Axillary)   Resp 14   Wt 71.8 kg (158 lb 3.2 oz)   SpO2 94%   BMI 24.78 kg/m   Physical Exam: General: Alert and awake oriented x3 not in any acute distress. HEENT: anicteric sclera, pupils reactive to light and accommodation CVS: S1-S2 clear no murmur rubs or gallops Chest: clear to auscultation bilaterally, no wheezing rales or rhonchi Abdomen: soft nontender, nondistended, normal bowel sounds Extremities: no cyanosis, clubbing or  edema noted bilaterally Neuro: Cranial nerves II-XII intact, no focal neurological deficits   The results of significant diagnostics from this hospitalization (including imaging, microbiology, ancillary and laboratory) are listed below for reference.    LAB RESULTS: Basic Metabolic Panel: Recent Labs  Lab 12/01/17 2119 12/02/17 0322  NA 135 135  K 3.9 3.5  CL 104 105  CO2 23 23  GLUCOSE 119* 140*  BUN 14 14  CREATININE 1.08* 1.01*  CALCIUM 8.9 9.1   Liver Function Tests: No results for input(s): AST, ALT, ALKPHOS, BILITOT, PROT, ALBUMIN in the last 168 hours. No results for input(s): LIPASE, AMYLASE in the last 168 hours. No results for input(s): AMMONIA in the last 168 hours. CBC: Recent Labs  Lab 12/01/17 2119 12/02/17 0322  WBC 6.4 5.6  HGB 13.6 13.3  HCT 41.5 39.0  MCV 97.9 98.2  PLT 141* 128*   Cardiac Enzymes: No results for input(s): CKTOTAL, CKMB, CKMBINDEX, TROPONINI in the last 168 hours. BNP: Invalid input(s): POCBNP CBG: No results for input(s): GLUCAP  in the last 168 hours.  Significant Diagnostic Studies:  Dg Chest 2 View  Result Date: 12/01/2017 CLINICAL DATA:  Chest pain and shortness of breath. Palpitations and dizziness. EXAM: CHEST  2 VIEW COMPARISON:  Radiographs 11/24/2016 FINDINGS: Left-sided pacemaker in place. The cardiomediastinal contours are unchanged with aortic atherosclerosis. Mild eventration of right hemidiaphragm. Pulmonary vasculature is normal. No consolidation, pleural effusion, or pneumothorax. No acute osseous abnormalities are seen. IMPRESSION: 1. No acute abnormality. 2. Aortic atherosclerosis. Electronically Signed   By: Jeb Levering M.D.   On: 12/01/2017 21:37   Ct Head Wo Contrast  Result Date: 12/01/2017 CLINICAL DATA:  Chest pain, palpitations, and dizziness. Shortness of breath. EXAM: CT HEAD WITHOUT CONTRAST TECHNIQUE: Contiguous axial images were obtained from the base of the skull through the vertex without  intravenous contrast. COMPARISON:  06/07/2016 FINDINGS: Brain: Diffuse cerebral atrophy. Ventricular dilatation consistent with central atrophy. Low-attenuation changes throughout the deep white matter consistent with small vessel ischemia. No mass-effect or midline shift. No abnormal extra-axial fluid collections. Gray-white matter junctions are distinct. Basal cisterns are not effaced. No acute intracranial hemorrhage. Vascular: Intracranial artery vascular calcifications are present. Skull: Calvarium is intact. Sinuses/Orbits: Paranasal sinuses and mastoid air cells are not opacified. Other: None. IMPRESSION: No acute intracranial abnormalities. Prominent chronic atrophy and small vessel ischemic changes. Electronically Signed   By: Lucienne Capers M.D.   On: 12/01/2017 23:13    2D ECHO:  Study Conclusions  - Left ventricle: The cavity size was normal. Systolic function was   normal. The estimated ejection fraction was in the range of 55%   to 60%. Wall motion was normal; there were no regional wall   motion abnormalities. - Aortic valve: There was mild regurgitation. - Mitral valve: Mildly calcified annulus. - Left atrium: The atrium was mildly dilated. - Pulmonary arteries: Systolic pressure was moderately increased.   PA peak pressure: 48 mm Hg (S).  -------------------------------------------------------------------  Disposition and Follow-up: Discharge Instructions    Diet - low sodium heart healthy   Complete by:  As directed    Discharge instructions   Complete by:  As directed    Please start propafenone tonight at home and changed to twice a day.   Increase activity slowly   Complete by:  As directed        DISPOSITION: Home   DISCHARGE FOLLOW-UP Follow-up Information    [facility] Follow up on 12/19/2017.   Specialty:  Cardiology Why:  11:00AM Contact information: [address] [id number] [place]       Lajean Manes, MD. Schedule an appointment as soon as possible for a visit in 2 week(s).   Specialty:  Internal Medicine Contact information: [address] [place] [address] [phone]        Deboraha Sprang, MD. Schedule an appointment as soon as possible for a visit in 2 week(s).   Specialty:  Cardiology Contact information: [address] [place] [address] [phone]            Time spent on Discharge: 35 minutes  Signed:   Estill Cotta M.D. [facility] 12/03/2017, 11:05 AM Pager: [phone]

## 2017-12-03 NOTE — Progress Notes (Signed)
Progress Note  Patient Name: [name] [name] Date of Encounter: 12/03/2017  Primary Cardiologist: SJ  Primary Electrophysiologist: sk   Patient Profile     84 y.o. female Admitted with imbalance and QRS prolongation assoc with uptitration of propafenone  Subjective   Feels better with less imbalance no palpitations and no sob   Very upset by neuro consult last night  In tears recollecting the experience   Inpatient Medications    Scheduled Meds: . allopurinol  100 mg Oral Daily  . apixaban  5 mg Oral BID  . cholecalciferol  2,000 Units Oral Daily  . guaiFENesin  600 mg Oral BID  . loratadine  10 mg Oral Daily  . sodium chloride flush  3 mL Intravenous Q12H   Continuous Infusions:  PRN Meds: acetaminophen, LORazepam, simethicone, sodium chloride   Vital Signs    Vitals:   12/02/17 1510 12/02/17 2033 12/03/17 0000 12/03/17 0626  BP: (!) 143/72 (!) 142/65  (!) 142/85  Pulse: 68 65 62 60  Resp: 16 19 13 14   Temp: (!) 97.4 F (36.3 C) 97.7 F (36.5 C)  98.8 F (37.1 C)  TempSrc: Oral Oral  Axillary  SpO2: 95% 95% 96% 94%  Weight:    158 lb 3.2 oz (71.8 kg)    Intake/Output Summary (Last 24 hours) at 12/03/2017 0831 Last data filed at 12/03/2017 0033 Gross per 24 hour  Intake 420 ml  Output 750 ml  Net -330 ml   Filed Weights   12/02/17 0640 12/03/17 0626  Weight: 161 lb 12.8 oz (73.4 kg) 158 lb 3.2 oz (71.8 kg)    Telemetry    Non afib - Personally Reviewed  ECG    Sinus with QRS 180>>135 * - Personally Reviewed  Physical Exam   GEN: No acute distress.   Neck: JVDflat Cardiac: RRR, no  murmurs, rubs, or gallops.  Respiratory: Clear to auscultation bilaterally. GI: Soft, nontender, non-distended  MS:  edema; No deformity. Neuro:  Nonfocal  Psych: Normal affect  Skin Warm and dry   Labs    Chemistry Recent Labs  Lab 12/01/17 2119 12/02/17 0322  NA 135 135  K 3.9 3.5  CL 104 105  CO2 23 23  GLUCOSE 119* 140*  BUN 14 14    CREATININE 1.08* 1.01*  CALCIUM 8.9 9.1  GFRNONAA 46* 50*  GFRAA 53* 58*  ANIONGAP 8 7     Hematology Recent Labs  Lab 12/01/17 2119 12/02/17 0322  WBC 6.4 5.6  RBC 4.24 3.97  HGB 13.6 13.3  HCT 41.5 39.0  MCV 97.9 98.2  MCH 32.1 33.5  MCHC 32.8 34.1  RDW 14.4 14.6  PLT 141* 128*    Cardiac EnzymesNo results for input(s): TROPONINI in the last 168 hours.  Recent Labs  Lab 12/01/17 2132  TROPIPOC 0.00     BNP Recent Labs  Lab 12/01/17 2119  BNP 98.2     DDimer No results for input(s): DDIMER in the last 168 hours.   Radiology    Dg Chest 2 View  Result Date: 12/01/2017 CLINICAL DATA:  Chest pain and shortness of breath. Palpitations and dizziness. EXAM: CHEST  2 VIEW COMPARISON:  Radiographs 11/24/2016 FINDINGS: Left-sided pacemaker in place. The cardiomediastinal contours are unchanged with aortic atherosclerosis. Mild eventration of right hemidiaphragm. Pulmonary vasculature is normal. No consolidation, pleural effusion, or pneumothorax. No acute osseous abnormalities are seen. IMPRESSION: 1. No acute abnormality. 2. Aortic atherosclerosis. Electronically Signed   By: Fonnie Birkenhead.D.  On: 12/01/2017 21:37   Ct Head Wo Contrast  Result Date: 12/01/2017 CLINICAL DATA:  Chest pain, palpitations, and dizziness. Shortness of breath. EXAM: CT HEAD WITHOUT CONTRAST TECHNIQUE: Contiguous axial images were obtained from the base of the skull through the vertex without intravenous contrast. COMPARISON:  06/07/2016 FINDINGS: Brain: Diffuse cerebral atrophy. Ventricular dilatation consistent with central atrophy. Low-attenuation changes throughout the deep white matter consistent with small vessel ischemia. No mass-effect or midline shift. No abnormal extra-axial fluid collections. Gray-white matter junctions are distinct. Basal cisterns are not effaced. No acute intracranial hemorrhage. Vascular: Intracranial artery vascular calcifications are present. Skull: Calvarium is  intact. Sinuses/Orbits: Paranasal sinuses and mastoid air cells are not opacified. Other: None. IMPRESSION: No acute intracranial abnormalities. Prominent chronic atrophy and small vessel ischemic changes. Electronically Signed   By: Lucienne Capers M.D.   On: 12/01/2017 23:13    Cardiac Studies     Device Interrogation    As yesterday   Assessment & Plan    Atrial fib paroxysmal  PVCs  Propafenone toxicity  Pacemaker   QRS much improved today  Ok to discharge today and resume propafenone tonight; continue mg Oxide  followup in about 2-3 weeks to assess Afib burden  If significant would add low dose ranolazine Signed, Virl Axe, MD  12/03/2017, 8:31 AM

## 2017-12-03 NOTE — Progress Notes (Signed)
Pt ambulated in hallway approx 251ft as charted per Epic. Pt denies any CP, palpitations, lightheadedness/dizziness, SOB during ambulation.

## 2017-12-04 NOTE — Consult Note (Signed)
            [facility] CM Primary Care Navigator  12/04/2017  [name] [name] Aug 16, 1932 [id number]   Attempt to seepatient at the bedsideto identify possible discharge needs butshe was alreadydischargedper staff report.  Per chart review, patient presented with weakness and swelling to her bilateral lower extremities and feeling of being off balance. Symptoms improved with medication adjustments.  Patient was discharged homeyesterday.  Primary care provider's officeis listed asprovidingtransition of care (TOC).  Patient has discharge instruction to follow-up with primary care provider and cardiology for a visit in 2 weeks.    For additional questions please contact:  Edwena Felty A. Abdulwahab Demelo, BSN, RN-BC [facility] PRIMARY CARE Navigator Cell: [phone]

## 2017-12-06 DIAGNOSIS — R2681 Unsteadiness on feet: Secondary | ICD-10-CM | POA: Diagnosis not present

## 2017-12-06 DIAGNOSIS — I480 Paroxysmal atrial fibrillation: Secondary | ICD-10-CM | POA: Diagnosis not present

## 2017-12-07 ENCOUNTER — Telehealth: Payer: Self-pay | Admitting: Internal Medicine

## 2017-12-07 NOTE — Telephone Encounter (Signed)
Returned call to patient. She states that per her discharge paperwork she needs to schedule an appt with Dr. Caryl Comes in 2 weeks. Informed patient I would send to Dr. Olin Pia scheduler to make an appt. Patient verbalized understanding and thanked me for the call.

## 2017-12-07 NOTE — Telephone Encounter (Signed)
New message   Patient calling for hospital f/u appt with Dr Caryl Comes. Patient declined to see APP staff. Declined next available. Patient requesting call from nurse.

## 2017-12-13 DIAGNOSIS — Z1389 Encounter for screening for other disorder: Secondary | ICD-10-CM | POA: Diagnosis not present

## 2017-12-13 DIAGNOSIS — Z0000 Encounter for general adult medical examination without abnormal findings: Secondary | ICD-10-CM | POA: Diagnosis not present

## 2017-12-19 ENCOUNTER — Ambulatory Visit (HOSPITAL_COMMUNITY)
Admit: 2017-12-19 | Discharge: 2017-12-19 | Disposition: A | Discharge status: Home / Self Care | Payer: Medicare Other | Attending: Nurse Practitioner | Admitting: Nurse Practitioner

## 2017-12-19 ENCOUNTER — Encounter (HOSPITAL_COMMUNITY): Payer: Self-pay | Admitting: Nurse Practitioner

## 2017-12-19 VITALS — BP 134/84 | HR 80 | Ht 67.0 in | Wt 159.0 lb

## 2017-12-19 DIAGNOSIS — Z8249 Family history of ischemic heart disease and other diseases of the circulatory system: Secondary | ICD-10-CM | POA: Insufficient documentation

## 2017-12-19 DIAGNOSIS — Z87891 Personal history of nicotine dependence: Secondary | ICD-10-CM | POA: Insufficient documentation

## 2017-12-19 DIAGNOSIS — Z950 Presence of cardiac pacemaker: Secondary | ICD-10-CM | POA: Diagnosis not present

## 2017-12-19 DIAGNOSIS — Z7901 Long term (current) use of anticoagulants: Secondary | ICD-10-CM | POA: Diagnosis not present

## 2017-12-19 DIAGNOSIS — Z79899 Other long term (current) drug therapy: Secondary | ICD-10-CM | POA: Diagnosis not present

## 2017-12-19 DIAGNOSIS — Z888 Allergy status to other drugs, medicaments and biological substances status: Secondary | ICD-10-CM | POA: Diagnosis not present

## 2017-12-19 DIAGNOSIS — Z8673 Personal history of transient ischemic attack (TIA), and cerebral infarction without residual deficits: Secondary | ICD-10-CM | POA: Diagnosis not present

## 2017-12-19 DIAGNOSIS — F329 Major depressive disorder, single episode, unspecified: Secondary | ICD-10-CM | POA: Insufficient documentation

## 2017-12-19 DIAGNOSIS — Z9102 Food additives allergy status: Secondary | ICD-10-CM | POA: Diagnosis not present

## 2017-12-19 DIAGNOSIS — I10 Essential (primary) hypertension: Secondary | ICD-10-CM | POA: Diagnosis not present

## 2017-12-19 DIAGNOSIS — Z809 Family history of malignant neoplasm, unspecified: Secondary | ICD-10-CM | POA: Diagnosis not present

## 2017-12-19 DIAGNOSIS — I480 Paroxysmal atrial fibrillation: Secondary | ICD-10-CM | POA: Insufficient documentation

## 2017-12-19 DIAGNOSIS — Z833 Family history of diabetes mellitus: Secondary | ICD-10-CM | POA: Insufficient documentation

## 2017-12-19 DIAGNOSIS — Z91018 Allergy to other foods: Secondary | ICD-10-CM | POA: Insufficient documentation

## 2017-12-19 DIAGNOSIS — Z823 Family history of stroke: Secondary | ICD-10-CM | POA: Diagnosis not present

## 2017-12-19 NOTE — Progress Notes (Signed)
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 female with a h/o PPM, CVA, paroxysmal afib that is in the afib clinic for f/u. She had been found to have increase in afib burden and her propafenone was increased. She started having symptoms of ataxia and was hospitalized. She was felt to be neurotoxic from increase in propafenone and her dose was lowered back to bid dosing form tid dosing. In the Afib clinic,the pt reports that she no longer has the ataxia,  she has noted a few episode of afib and takes the 30 mg  that makes the afib tolerable.   It was requested to have her device interrogated in the clinic today for burden, she is in [place] today but afib burden is at 20 %, with RVR at times. Longest episode 15 hours two days ago.  Today, she denies symptoms of palpitations, chest pain, shortness of breath, orthopnea, PND, lower extremity edema, dizziness, presyncope, syncope, or neurologic sequela. The patient is tolerating medications without difficulties and is otherwise without complaint today.   Past Medical History:  Diagnosis Date  . Atrial fibrillation ([place])   . CVA    Lacunar and cerebellar infarct  . Depression   . DVT (deep venous thrombosis) ([place])    Details not available in the chart  . Edema   . Mitral valve regurgitation    a. echo 2009 mild to moderate MR  . Sinoatrial node dysfunction ([facility])   . Unspecified essential hypertension    Past Surgical History:  Procedure Laterality Date  . PACEMAKER INSERTION  09-14-2005   MDT EnRhythm dual chamber pacemaker implanted by Dr Lovena Le for SND  . PPM GENERATOR CHANGEOUT N/A 05/07/2017   Procedure: PPM Generator Changeout;  Surgeon: Deboraha Sprang, MD;  Location: [facility];  Service: Cardiovascular;  Laterality: N/A;    Current Outpatient Medications  Medication Sig Dispense Refill  . acetaminophen (TYLENOL) 650 MG CR tablet Take 650 mg by mouth every 8  (eight) hours as needed for pain.    Marland Kitchen allopurinol (ZYLOPRIM) 100 MG tablet Take 100 mg by mouth daily.    Marland Kitchen apixaban (ELIQUIS) 5 MG TABS tablet Take 1 tablet (5 mg total) by mouth 2 (two) times daily. 60 tablet 10  . Apoaequorin (PREVAGEN) 10 MG CAPS Take 10 mg by mouth daily.     . Cholecalciferol (VITAMIN D3) 2000 UNITS TABS Take 2,000 Units by mouth daily.     Marland Kitchen desonide (DESOWEN) 0.05 % ointment Apply 1 application topically 2 (two) times daily as needed (dry skin).    Marland Kitchen diltiazem (CARDIZEM) 30 MG tablet Take 1 tablet (30 mg total) by mouth daily as needed. FOR PALPITATIONS (Patient taking differently: Take 30-60 mg by mouth daily as needed (for palpitations). ) 45 tablet 6  . folic acid (FOLVITE) 1 MG tablet Take 1 mg by mouth once a week.     Marland Kitchen guaiFENesin (MUCINEX) 600 MG 12 hr tablet Take 1 tablet (600 mg total) by mouth 2 (two) times daily as needed for cough or to loosen phlegm. 30 tablet 0  . loratadine (CLARITIN) 10 MG tablet Take 1 tablet (10 mg total) by mouth daily. For allergies, congestion 30 tablet 3  . LORazepam (ATIVAN) 0.5 MG tablet Take 0.5 mg by mouth at bedtime as needed for sleep.     . Polyvinyl Alcohol-Povidone (REFRESH OP) Apply 1 drop to eye 2 (two) times daily as needed (  dry eyes).    . propafenone (RYTHMOL) 225 MG tablet Take 1 tablet (225 mg total) by mouth 2 (two) times daily. 60 tablet 2  . sodium chloride (OCEAN) 0.65 % SOLN nasal spray Place 1 spray into both nostrils as needed for congestion.     No current facility-administered medications for this encounter.     Allergies  Allergen Reactions  . Gluten Meal Diarrhea  . Itraconazole Diarrhea and Itching  . Lactose Intolerance (Gi) Diarrhea and Nausea Only    Pt unsure  . Statins     Severe Muscle ache,can't tolerate     Social History   Socioeconomic History  . Marital status: Widowed    Spouse name: Not on file  . Number of children: Not on file  . Years of education: Not on file  . Highest  education level: Not on file  Social Needs  . Financial resource strain: Not on file  . Food insecurity - worry: Not on file  . Food insecurity - inability: Not on file  . Transportation needs - medical: Not on file  . Transportation needs - non-medical: Not on file  Occupational History  . Not on file  Tobacco Use  . Smoking status: Former Smoker    Last attempt to quit: 08/13/1984    Years since quitting: 33.3  . Smokeless tobacco: Never Used  Substance and Sexual Activity  . Alcohol use: Yes    Alcohol/week: 8.4 oz    Types: 14 Glasses of wine per week  . Drug use: No  . Sexual activity: Not Currently  Other Topics Concern  . Not on file  Social History Narrative  . Not on file    Family History  Problem Relation Age of Onset  . Cancer Mother   . Mitral valve prolapse Mother   . Diabetes Father   . Hypertension Father   . Coronary artery disease Sister   . Stroke Brother   . Coronary artery disease Sister     ROS- All systems are reviewed and negative except as per the HPI above  Physical Exam: Vitals:   12/19/17 1111  BP: 134/84  Pulse: 80  Weight: 159 lb (72.1 kg)  Height: 5\' 7"  (1.702 m)   Wt Readings from Last 3 Encounters:  12/19/17 159 lb (72.1 kg)  12/03/17 158 lb 3.2 oz (71.8 kg)  11/08/17 161 lb (73 kg)    Labs: Lab Results  Component Value Date   NA 135 12/02/2017   K 3.5 12/02/2017   CL 105 12/02/2017   CO2 23 12/02/2017   GLUCOSE 140 (H) 12/02/2017   BUN 14 12/02/2017   CREATININE 1.01 (H) 12/02/2017   CALCIUM 9.1 12/02/2017   Lab Results  Component Value Date   INR 1.0 05/01/2017   Lab Results  Component Value Date   CHOL 219 (H) 12/02/2017   HDL 58 12/02/2017   LDLCALC 137 (H) 12/02/2017   TRIG 119 12/02/2017     GEN- The patient is well appearing, alert and oriented x 3 today.   Head- normocephalic, atraumatic Eyes-  Sclera clear, conjunctiva pink Ears- hearing intact Oropharynx- clear Neck- supple, no JVP Lymph- no  cervical lymphadenopathy Lungs- Clear to ausculation bilaterally, normal work of breathing Heart- Regular rate and rhythm, no murmurs, rubs or gallops, PMI not laterally displaced GI- soft, NT, ND, + BS Extremities- no clubbing, cyanosis, or edema MS- no significant deformity or atrophy Skin- no rash or lesion Psych- euthymic mood, full affect Neuro- strength  and sensation are intact  EKG-a paced at 80 bpm, pr int 206 ms, qrs int 134 ms, qtc 463 ms Epic records reviewed [employer] rep in for interrogation, see HPI     Assessment and Plan: 1. Paroxysmal afib  Recent increasing afib burden Did not tolerate increase in propafenone 2/2 neurotoxicity On her usual bid dosing Feels well but is still noticing some episodes Dr. Caryl Comes discussed with her changing to amiodarone, flecainide, which she is not a fan of either drug, dofetilide or sotalol Continue eliquis 5 mg bid for chadsvasc score of at least 5 She is drinking 2 glasses of wine a night and I have asked her to decrease her alcohol intake as it can trigger episodes of afib  I will arrange for her to see him in the office to further discuss change of antiarrythmics  Butch Penny C. Raeshawn Tafolla, [facility] [address] [place], [place] [address] [phone]

## 2018-01-03 ENCOUNTER — Telehealth: Payer: Self-pay | Admitting: Internal Medicine

## 2018-01-16 ENCOUNTER — Ambulatory Visit (INDEPENDENT_AMBULATORY_CARE_PROVIDER_SITE_OTHER): Payer: Medicare Other | Admitting: Internal Medicine

## 2018-01-16 VITALS — BP 125/66 | HR 116 | Ht 67.0 in | Wt 150.0 lb

## 2018-01-16 DIAGNOSIS — I495 Sick sinus syndrome: Secondary | ICD-10-CM

## 2018-01-16 DIAGNOSIS — I480 Paroxysmal atrial fibrillation: Secondary | ICD-10-CM | POA: Diagnosis not present

## 2018-01-16 DIAGNOSIS — Z950 Presence of cardiac pacemaker: Secondary | ICD-10-CM

## 2018-01-16 LAB — CUP PACEART INCLINIC DEVICE CHECK
Battery Remaining Longevity: 168 mo
Battery Voltage: 3.13 V
Brady Statistic AP VP Percent: 0.02 %
Brady Statistic AS VS Percent: 32.76 %
Brady Statistic RA Percent Paced: 60.42 %
Date Time Interrogation Session: 20190220165711
Implantable Lead Implant Date: 20061019
Implantable Lead Location: 753860
Implantable Pulse Generator Implant Date: 20180611
Lead Channel Impedance Value: 551 Ohm
Lead Channel Pacing Threshold Amplitude: 1 V
Lead Channel Pacing Threshold Pulse Width: 0.4 ms
Lead Channel Setting Pacing Amplitude: 1.5 V
Lead Channel Setting Pacing Amplitude: 2.5 V
Lead Channel Setting Pacing Pulse Width: 0.4 ms
Lead Channel Setting Sensing Sensitivity: 0.9 mV
MDC IDC LEAD IMPLANT DT: 20061019
MDC IDC LEAD LOCATION: 753859
MDC IDC MSMT LEADCHNL RA IMPEDANCE VALUE: 494 Ohm
MDC IDC MSMT LEADCHNL RA SENSING INTR AMPL: 3.625 mV
MDC IDC MSMT LEADCHNL RV IMPEDANCE VALUE: 361 Ohm
MDC IDC MSMT LEADCHNL RV IMPEDANCE VALUE: 418 Ohm
MDC IDC MSMT LEADCHNL RV SENSING INTR AMPL: 8.75 mV
MDC IDC STAT BRADY AP VS PERCENT: 67.25 %
MDC IDC STAT BRADY AS VP PERCENT: 0.02 %
MDC IDC STAT BRADY RV PERCENT PACED: 0.09 %

## 2018-01-16 MED ORDER — MAGNESIUM OXIDE -MG SUPPLEMENT 400 (240 MG) MG PO TABS: 400.0000 mg | ORAL_TABLET | Freq: Every day | ORAL | 3 refills | Status: DC

## 2018-01-16 MED ORDER — DILTIAZEM HCL ER COATED BEADS 180 MG PO CP24: 180.0000 mg | ORAL_CAPSULE | Freq: Every day | ORAL | 3 refills | Status: DC

## 2018-01-16 NOTE — Progress Notes (Signed)
HPI  [name] [name] is a 84 y.o. 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 Underwent generator replacement 5/18  Device interrogation demonstrated paroxysms of atrial fibrillation with ventricular rates into the 180s 190s.  She was started on low-dose beta blockers. Previously she did not tolerate metoprolol succinate.  She was also switched and calcium blockers which have been associated with some peripheral edema.   She was managed with propafenone from 2012.  Unfortunately, with increasing symptoms, we undertook dose up titration and she became propafenone neurally toxic.  2019.   It was down titrated.  Unfortunately, has not been able to hold sinus rhythm.   DATE TEST EF   8/15 Echo   55-65 %   1/19 Echo   55-65 % Mild LAE           Date Cr K TSH5.506 Hgb  6/18 0.89   13.6  1/19  1.01 3.5  13.3    She is struggling with frequent symptomatic atrial fibrillation.  Palpitations shortness of breath and dizziness.  When her heart gets fast, she has been taking as needed diltiazem with some benefit.  With her heart is in rhythm her exercise tolerance is stable.  Markedly less exercise tolerance with A. fib  Past Medical History:  Diagnosis Date  . Atrial fibrillation ([place])   . CVA    Lacunar and cerebellar infarct  . Depression   . DVT (deep venous thrombosis) ([place])    Details not available in the chart  . Edema   . Mitral valve regurgitation    a. echo 2009 mild to moderate MR  . Sinoatrial node dysfunction ([facility])   . Unspecified essential hypertension     Past Surgical History:  Procedure Laterality Date  . PACEMAKER INSERTION  09-14-2005   MDT EnRhythm dual chamber pacemaker implanted by Dr Lovena Le for SND  . PPM GENERATOR CHANGEOUT N/A 05/07/2017   Procedure: PPM Generator Changeout;  Surgeon: Deboraha Sprang, MD;  Location: [facility];   Service: Cardiovascular;  Laterality: N/A;    Current Outpatient Medications  Medication Sig Dispense Refill  . acetaminophen (TYLENOL) 650 MG CR tablet Take 650 mg by mouth every 8 (eight) hours as needed for pain.    Marland Kitchen allopurinol (ZYLOPRIM) 100 MG tablet Take 100 mg by mouth daily.    Marland Kitchen apixaban (ELIQUIS) 5 MG TABS tablet Take 1 tablet (5 mg total) by mouth 2 (two) times daily. 60 tablet 10  . Apoaequorin (PREVAGEN) 10 MG CAPS Take 10 mg by mouth daily.     . Cholecalciferol (VITAMIN D3) 2000 UNITS TABS Take 2,000 Units by mouth daily.     Marland Kitchen desonide (DESOWEN) 0.05 % ointment Apply 1 application topically 2 (two) times daily as needed (dry skin).    Marland Kitchen diltiazem (CARDIZEM) 30 MG tablet Take 1 tablet (30 mg total) by mouth daily as needed. FOR PALPITATIONS (Patient taking differently: Take 30-60 mg by mouth daily as needed (for palpitations). ) 45 tablet 6  . folic acid (FOLVITE) 1 MG tablet Take 1 mg by mouth once a week.     Marland Kitchen guaiFENesin (MUCINEX) 600 MG 12 hr tablet Take 1 tablet (600 mg total) by mouth 2 (two) times daily as needed for cough or to loosen phlegm. 30 tablet 0  . loratadine (CLARITIN) 10 MG tablet Take 1 tablet (10 mg total) by mouth daily. For allergies, congestion 30  tablet 3  . LORazepam (ATIVAN) 0.5 MG tablet Take 0.5 mg by mouth at bedtime as needed for sleep.     . Polyvinyl Alcohol-Povidone (REFRESH OP) Apply 1 drop to eye 2 (two) times daily as needed (dry eyes).    . propafenone (RYTHMOL) 225 MG tablet Take 1 tablet (225 mg total) by mouth 2 (two) times daily. 60 tablet 2  . sodium chloride (OCEAN) 0.65 % SOLN nasal spray Place 1 spray into both nostrils as needed for congestion.     No current facility-administered medications for this visit.     Allergies  Allergen Reactions  . Gluten Meal Diarrhea  . Itraconazole Diarrhea and Itching  . Lactose Intolerance (Gi) Diarrhea and Nausea Only    Pt unsure  . Statins     Severe Muscle ache,can't tolerate      Review of Systems negative except from HPI and PMH  Physical Exam BP 125/66   Pulse (!) 116   Ht 5\' 7"  (1.702 m)   Wt 150 lb (68 kg)   BMI 23.49 kg/m  Well developed and nourished in no acute distress HENT normal Neck supple with JVP-flat Carotids brisk and full without bruits Clear Irregularly irregular rate and rhythm withrapid ventricular response, no murmurs or gallops Abd-soft with active BS without hepatomegaly No Clubbing cyanosis edema Skin-warm and dry A & Oriented  Grossly normal sensory and motor function   ECG ATRIAL fibrillation at 116 Intervals-/14/38 Axis 93 Right bundle branch block  Assessment and  Plan  Atrial fibrillation-paroxysmal  Hypertension   Sinus node dysfunction   Right bundle branch block  Pacemaker-[employer]tr[employer] The patient's device was interrogated.  The information was reviewed. No changes were made in the programming.      Significant interval atrial fibrillation.  Unable to tolerate higher doses of propafenone  We have reviewed the physiology of atrial fibrillation. Specifically, we have reviewed the timing and priming function of the atrium, the impact on rate because of atrial fibrillation high rates and our efforts to attenuate ventricular response by pharmacological manipulation of the AV node.  We discussed the potential effects on exercise tolerance related to rate irregularity and loss of priming function.    Furthermore we discussed the alternative of rhythm control including the potential benefits and risks of proarrhythmia.  At this point, she would like to pursue rate control.  We will switch her up afternoon to Cardizem 180 mg.  We will also add magnesium as a electrical stabilizer, per her request.  She is not interested in amiodarone because of side effects.  She is willing to consider dofetilide as I told her it would be "concierge "that is to say she would be untethered to her bed and free to get up and walk around  etc.  We will have her meet with DC again in about a month to see how she is doing on the new strategy.  More than 50% of 40 min was spent in counseling related to the above

## 2018-01-16 NOTE — Patient Instructions (Addendum)
Medication Instructions:  Your physician has recommended you make the following change in your medication:   1. Stop Rythmol 2. Begin Cardizem ER 180mg  tablet every day 3. Begin Mag Oxide 400mg  every day  Labwork: None ordered.  Testing/Procedures: None ordered.  Follow-Up: Your physician recommends that you schedule a follow-up appointment in 4 months with Roderic Palau in the Afib clinic   Any Other Special Instructions Will Be Listed Below (If Applicable).  Diltiazem extended-release capsules or tablets What is this medicine? DILTIAZEM (dil TYE a zem) is a calcium-channel blocker. It affects the amount of calcium found in your heart and muscle cells. This relaxes your blood vessels, which can reduce the amount of work the heart has to do. This medicine is used to treat high blood pressure and chest pain caused by angina. This medicine may be used for other purposes; ask your health care provider or pharmacist if you have questions. COMMON BRAND NAME(S): Cardizem CD, Cardizem LA, Cardizem SR, Cartia XT, Dilacor XR, Dilt-CD, Diltia XT, Diltzac, Matzim LA, Rema Fendt, Tiamate, Tiazac What should I tell my health care provider before I take this medicine? They need to know if you have any of these conditions: -heart problems, low blood pressure, irregular heartbeat -liver disease -previous heart attack -an unusual or allergic reaction to diltiazem, other medicines, foods, dyes, or preservatives -pregnant or trying to get pregnant -breast-feeding How should I use this medicine? Take this medicine by mouth with a glass of water. Follow the directions on the prescription label. Swallow whole, do not crush or chew. Ask your doctor or pharmacist if your should take this medicine with food. Take your doses at regular intervals. Do not take your medicine more often then directed. Do not stop taking except on the advice of your doctor or health care professional. Ask your doctor or health care  professional how to gradually reduce the dose. Talk to your pediatrician regarding the use of this medicine in children. Special care may be needed. Overdosage: If you think you have taken too much of this medicine contact a poison control center or emergency room at once. NOTE: This medicine is only for you. Do not share this medicine with others. What if I miss a dose? If you miss a dose, take it as soon as you can. If it is almost time for your next dose, take only that dose. Do not take double or extra doses. What may interact with this medicine? Do not take this medicine with any of the following medications: -cisapride -hawthorn -pimozide -ranolazine -red yeast rice This medicine may also interact with the following medications: -buspirone -carbamazepine -cimetidine -cyclosporine -digoxin -local anesthetics or general anesthetics -lovastatin -medicines for anxiety or difficulty sleeping like midazolam and triazolam -medicines for high blood pressure or heart problems -quinidine -rifampin, rifabutin, or rifapentine This list may not describe all possible interactions. Give your health care provider a list of all the medicines, herbs, non-prescription drugs, or dietary supplements you use. Also tell them if you smoke, drink alcohol, or use illegal drugs. Some items may interact with your medicine. What should I watch for while using this medicine? Check your blood pressure and pulse rate regularly. Ask your doctor or health care professional what your blood pressure and pulse rate should be and when you should contact him or her. You may feel dizzy or lightheaded. Do not drive, use machinery, or do anything that needs mental alertness until you know how this medicine affects you. To reduce the risk  of dizzy or fainting spells, do not sit or stand up quickly, especially if you are an older patient. Alcohol can make you more dizzy or increase flushing and rapid heartbeats. Avoid  alcoholic drinks. What side effects may I notice from receiving this medicine? Side effects that you should report to your doctor or health care professional as soon as possible: -allergic reactions like skin rash, itching or hives, swelling of the face, lips, or tongue -confusion, mental depression -feeling faint or lightheaded, falls -redness, blistering, peeling or loosening of the skin, including inside the mouth -slow, irregular heartbeat -swelling of the feet and ankles -unusual bleeding or bruising, pinpoint red spots on the skin Side effects that usually do not require medical attention (report to your doctor or health care professional if they continue or are bothersome): -constipation or diarrhea -difficulty sleeping -facial flushing -headache -nausea, vomiting -sexual dysfunction -weak or tired This list may not describe all possible side effects. Call your doctor for medical advice about side effects. You may report side effects to FDA at 1-800-FDA-1088. Where should I keep my medicine? Keep out of the reach of children. Store at room temperature between 15 and 30 degrees C (59 and 86 degrees F). Protect from humidity. Throw away any unused medicine after the expiration date. NOTE: This sheet is a summary. It may not cover all possible information. If you have questions about this medicine, talk to your doctor, pharmacist, or health care provider.  2018 Elsevier/Gold Standard (2008-03-05 14:35:47)      If you need a refill on your cardiac medications before your next appointment, please call your pharmacy.

## 2018-01-17 ENCOUNTER — Telehealth: Payer: Self-pay | Admitting: Internal Medicine

## 2018-01-17 NOTE — Telephone Encounter (Signed)
Returned pt's call and verified her current medications and the new changes made during her visit yesterday (stop Rythmol, Begin Cardizem, Begin Magnesium Ox). I instructed her to not stop taking her eliquis and to continue to take as prescribed. Pt verbalized understanding and had no further questions.

## 2018-01-17 NOTE — Telephone Encounter (Signed)
Pt c/o medication issue:  1. Name of Medication: Eliquis   2. How are you currently taking this medication (dosage and times per day)? 5 mg// 2x daily   3. Are you having a reaction (difficulty breathing--STAT)? no  4. What is your medication issue? Patient states that there was a change in her medications and would like to verify if she needs to discontinue the Eliquis

## 2018-02-07 ENCOUNTER — Ambulatory Visit (INDEPENDENT_AMBULATORY_CARE_PROVIDER_SITE_OTHER): Payer: Medicare Other | Admitting: *Deleted

## 2018-02-07 DIAGNOSIS — I495 Sick sinus syndrome: Secondary | ICD-10-CM | POA: Diagnosis not present

## 2018-02-07 NOTE — Progress Notes (Signed)
Remote pacemaker transmission.   

## 2018-02-08 ENCOUNTER — Encounter: Payer: Self-pay | Admitting: Cardiology

## 2018-02-19 LAB — CUP PACEART REMOTE DEVICE CHECK
Brady Statistic AP VP Percent: 0.2 %
Brady Statistic AS VP Percent: 0.06 %
Brady Statistic AS VS Percent: 39.92 %
Brady Statistic RA Percent Paced: 39.76 %
Date Time Interrogation Session: 20190314053559
Implantable Lead Implant Date: 20061019
Implantable Lead Location: 753860
Lead Channel Impedance Value: 323 Ohm
Lead Channel Impedance Value: 570 Ohm
Lead Channel Impedance Value: 627 Ohm
Lead Channel Pacing Threshold Amplitude: 0.75 V
Lead Channel Pacing Threshold Pulse Width: 0.4 ms
Lead Channel Sensing Intrinsic Amplitude: 2.75 mV
Lead Channel Setting Pacing Pulse Width: 0.4 ms
MDC IDC LEAD IMPLANT DT: 20061019
MDC IDC LEAD LOCATION: 753859
MDC IDC MSMT BATTERY REMAINING LONGEVITY: 168 mo
MDC IDC MSMT BATTERY VOLTAGE: 3.12 V
MDC IDC MSMT LEADCHNL RA PACING THRESHOLD AMPLITUDE: 0.375 V
MDC IDC MSMT LEADCHNL RA PACING THRESHOLD PULSEWIDTH: 0.4 ms
MDC IDC MSMT LEADCHNL RA SENSING INTR AMPL: 2 mV
MDC IDC MSMT LEADCHNL RA SENSING INTR AMPL: 2 mV
MDC IDC MSMT LEADCHNL RV IMPEDANCE VALUE: 399 Ohm
MDC IDC MSMT LEADCHNL RV SENSING INTR AMPL: 2.75 mV
MDC IDC PG IMPLANT DT: 20180611
MDC IDC SET LEADCHNL RA PACING AMPLITUDE: 1.5 V
MDC IDC SET LEADCHNL RV PACING AMPLITUDE: 2.5 V
MDC IDC SET LEADCHNL RV SENSING SENSITIVITY: 0.9 mV
MDC IDC STAT BRADY AP VS PERCENT: 60.01 %
MDC IDC STAT BRADY RV PERCENT PACED: 2.72 %

## 2018-04-02 ENCOUNTER — Telehealth: Payer: Self-pay | Admitting: Internal Medicine

## 2018-04-02 NOTE — Telephone Encounter (Signed)
Pt calls today with c/o a more rapid HR, more frequent episodes of Afib, and feelings of light headedness with a recent fall. She also has BLE swelling. Currently she is taking Cardizem CD 180 qd and Cardizem 30mg  prn for Afib episodes. She would also like to discuss possible options for an ablation.   She requests to see Dr Caryl Comes this week, however he is currently out of the office. I was able to set up an appointment for her to see Roderic Palau on Fri May 10th for further evaluation. In the meantime, I advised pt to be mindful of feeling of light headedness and to try and find a seat when she is feeling dizzy. She verbalizes understanding and will be at her appt on Friday.

## 2018-04-02 NOTE — Telephone Encounter (Signed)
New message  Pt verbalzied that she is calling for RN  She want to speak to provider about having an ablastin  Please call pt

## 2018-04-05 ENCOUNTER — Ambulatory Visit (HOSPITAL_COMMUNITY)
Admission: RE | Admit: 2018-04-05 | Discharge: 2018-04-05 | Disposition: A | Discharge status: Home / Self Care | Payer: Medicare Other | Source: Ambulatory Visit | Attending: Nurse Practitioner | Admitting: Nurse Practitioner

## 2018-04-05 ENCOUNTER — Encounter (HOSPITAL_COMMUNITY): Payer: Self-pay | Admitting: Nurse Practitioner

## 2018-04-05 ENCOUNTER — Telehealth: Payer: Self-pay | Admitting: *Deleted

## 2018-04-05 VITALS — BP 126/84 | HR 83 | Ht 67.0 in | Wt 158.0 lb

## 2018-04-05 DIAGNOSIS — F329 Major depressive disorder, single episode, unspecified: Secondary | ICD-10-CM | POA: Diagnosis not present

## 2018-04-05 DIAGNOSIS — Z79899 Other long term (current) drug therapy: Secondary | ICD-10-CM | POA: Diagnosis not present

## 2018-04-05 DIAGNOSIS — Z883 Allergy status to other anti-infective agents status: Secondary | ICD-10-CM | POA: Insufficient documentation

## 2018-04-05 DIAGNOSIS — Z888 Allergy status to other drugs, medicaments and biological substances status: Secondary | ICD-10-CM | POA: Insufficient documentation

## 2018-04-05 DIAGNOSIS — Z87891 Personal history of nicotine dependence: Secondary | ICD-10-CM | POA: Insufficient documentation

## 2018-04-05 DIAGNOSIS — I10 Essential (primary) hypertension: Secondary | ICD-10-CM | POA: Insufficient documentation

## 2018-04-05 DIAGNOSIS — Z8673 Personal history of transient ischemic attack (TIA), and cerebral infarction without residual deficits: Secondary | ICD-10-CM | POA: Diagnosis not present

## 2018-04-05 DIAGNOSIS — I4891 Unspecified atrial fibrillation: Secondary | ICD-10-CM | POA: Diagnosis present

## 2018-04-05 DIAGNOSIS — R55 Syncope and collapse: Secondary | ICD-10-CM | POA: Diagnosis not present

## 2018-04-05 DIAGNOSIS — Z7901 Long term (current) use of anticoagulants: Secondary | ICD-10-CM | POA: Diagnosis not present

## 2018-04-05 DIAGNOSIS — Z950 Presence of cardiac pacemaker: Secondary | ICD-10-CM | POA: Diagnosis not present

## 2018-04-05 DIAGNOSIS — I480 Paroxysmal atrial fibrillation: Secondary | ICD-10-CM | POA: Diagnosis not present

## 2018-04-05 DIAGNOSIS — I4510 Unspecified right bundle-branch block: Secondary | ICD-10-CM | POA: Insufficient documentation

## 2018-04-05 MED ORDER — DILTIAZEM HCL ER COATED BEADS 240 MG PO CP24: 240.0000 mg | ORAL_CAPSULE | Freq: Every day | ORAL | 3 refills | Status: DC

## 2018-04-05 NOTE — Telephone Encounter (Signed)
-----   Message from Juluis Mire, RN sent at 04/05/2018  2:18 PM EDT ----- Regarding: remote transmission Pt is going to attempt to send a remote transmission (it maybe Monday before she "has time") she had a near syncope episode on 5/6 and donna would like to see if anything is on her transmission. Please let donna know what her report shows (as well as afib burden since march). Patients box is normally an automatic send so she is unsure of how to send so she may call device clinic to get assistance fyi...  Thanks! Stacy RN AFib Clinic

## 2018-04-05 NOTE — Progress Notes (Signed)
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 female with a h/o PPM, CVA, paroxysmal afib that  was  in the afib clinic for f/u in January of 2019.Marland Kitchen She had been found to have increase in afib burden and her propafenone was increased. She started having symptoms of ataxia and was hospitalized. She was felt to be neurotoxic from increase in propafenone and her dose was lowered back to bid dosing form tid dosing. In the Afib clinic,the pt reports that she no longer has the ataxia,  she has noted a few episode of afib and takes the 30 mg  that makes the afib tolerable.  She was later taken of Rythmol  by Dr. Caryl Comes.  She  is being seen in the afib clinic today for increase in afib episodes and an episode of afib last week where she felt like she might pass out. She went down to the ground but did not pass out. She did not seek medical care after that. She feels that if daily Cardizem was increased it would decrease her afib burden, because taking the 30 mg Cardizem works well for her. She has had pedal edema in the past and was rieminded that this could occur with higher doses of Cardizem.  Today, she denies symptoms of palpitations, chest pain, shortness of breath, orthopnea, PND, lower extremity edema, dizziness, presyncope, syncope, or neurologic sequela. The patient is tolerating medications without difficulties and is otherwise without complaint today.   Past Medical History:  Diagnosis Date  . Atrial fibrillation ([place])   . CVA    Lacunar and cerebellar infarct  . Depression   . DVT (deep venous thrombosis) ([place])    Details not available in the chart  . Edema   . Mitral valve regurgitation    a. echo 2009 mild to moderate MR  . Sinoatrial node dysfunction ([facility])   . Unspecified essential hypertension    Past Surgical History:  Procedure Laterality Date  . PACEMAKER INSERTION  09-14-2005   MDT EnRhythm dual chamber  pacemaker implanted by Dr Lovena Le for SND  . PPM GENERATOR CHANGEOUT N/A 05/07/2017   Procedure: PPM Generator Changeout;  Surgeon: Deboraha Sprang, MD;  Location: [facility];  Service: Cardiovascular;  Laterality: N/A;    Current Outpatient Medications  Medication Sig Dispense Refill  . acetaminophen (TYLENOL) 650 MG CR tablet Take 650 mg by mouth every 8 (eight) hours as needed for pain.    Marland Kitchen allopurinol (ZYLOPRIM) 100 MG tablet Take 100 mg by mouth daily.    Marland Kitchen apixaban (ELIQUIS) 5 MG TABS tablet Take 1 tablet (5 mg total) by mouth 2 (two) times daily. 60 tablet 10  . Apoaequorin (PREVAGEN) 10 MG CAPS Take 10 mg by mouth daily.     . Cholecalciferol (VITAMIN D3) 2000 UNITS TABS Take 2,000 Units by mouth daily.     Marland Kitchen desonide (DESOWEN) 0.05 % ointment Apply 1 application topically 2 (two) times daily as needed (dry skin).    Marland Kitchen diltiazem (CARDIZEM CD) 180 MG 24 hr capsule Take 1 capsule (180 mg total) by mouth daily. 90 capsule 3  . diltiazem (CARDIZEM) 30 MG tablet Take 1 tablet (30 mg total) by mouth daily as needed. FOR PALPITATIONS (Patient taking differently: Take 30-60 mg by mouth daily as needed (for palpitations). ) 45 tablet 6  . folic acid (FOLVITE) 1 MG tablet Take 1 mg by mouth once a week.     Marland Kitchen  guaiFENesin (MUCINEX) 600 MG 12 hr tablet Take 1 tablet (600 mg total) by mouth 2 (two) times daily as needed for cough or to loosen phlegm. 30 tablet 0  . loratadine (CLARITIN) 10 MG tablet Take 1 tablet (10 mg total) by mouth daily. For allergies, congestion 30 tablet 3  . LORazepam (ATIVAN) 0.5 MG tablet Take 0.5 mg by mouth at bedtime as needed for sleep.     . Magnesium Oxide 400 (240 Mg) MG TABS Take 1 tablet (400 mg total) by mouth daily. 90 tablet 3  . Polyvinyl Alcohol-Povidone (REFRESH OP) Apply 1 drop to eye 2 (two) times daily as needed (dry eyes).    . sodium chloride (OCEAN) 0.65 % SOLN nasal spray Place 1 spray into both nostrils as needed for congestion.     No  current facility-administered medications for this encounter.     Allergies  Allergen Reactions  . Gluten Meal Diarrhea  . Itraconazole Diarrhea and Itching  . Lactose Intolerance (Gi) Diarrhea and Nausea Only    Pt unsure  . Statins     Severe Muscle ache,can't tolerate     Social History   Socioeconomic History  . Marital status: Widowed    Spouse name: Not on file  . Number of children: Not on file  . Years of education: Not on file  . Highest education level: Not on file  Occupational History  . Not on file  Social Needs  . Financial resource strain: Not on file  . Food insecurity:    Worry: Not on file    Inability: Not on file  . Transportation needs:    Medical: Not on file    Non-medical: Not on file  Tobacco Use  . Smoking status: Former Smoker    Last attempt to quit: 08/13/1984    Years since quitting: 33.6  . Smokeless tobacco: Never Used  Substance and Sexual Activity  . Alcohol use: Yes    Alcohol/week: 8.4 oz    Types: 14 Glasses of wine per week  . Drug use: No  . Sexual activity: Not Currently  Lifestyle  . Physical activity:    Days per week: Not on file    Minutes per session: Not on file  . Stress: Not on file  Relationships  . Social connections:    Talks on phone: Not on file    Gets together: Not on file    Attends religious service: Not on file    Active member of club or organization: Not on file    Attends meetings of clubs or organizations: Not on file    Relationship status: Not on file  . Intimate partner violence:    Fear of current or ex partner: Not on file    Emotionally abused: Not on file    Physically abused: Not on file    Forced sexual activity: Not on file  Other Topics Concern  . Not on file  Social History Narrative  . Not on file    Family History  Problem Relation Age of Onset  . Cancer Mother   . Mitral valve prolapse Mother   . Diabetes Father   . Hypertension Father   . Coronary artery disease Sister    . Stroke Brother   . Coronary artery disease Sister     ROS- All systems are reviewed and negative except as per the HPI above  Physical Exam: Vitals:   04/05/18 1336  BP: 126/84  Pulse: 83  Weight: 158  lb (71.7 kg)  Height: 5\' 7"  (1.702 m)   Wt Readings from Last 3 Encounters:  04/05/18 158 lb (71.7 kg)  01/16/18 150 lb (68 kg)  12/19/17 159 lb (72.1 kg)    Labs: Lab Results  Component Value Date   NA 135 12/02/2017   K 3.5 12/02/2017   CL 105 12/02/2017   CO2 23 12/02/2017   GLUCOSE 140 (H) 12/02/2017   BUN 14 12/02/2017   CREATININE 1.01 (H) 12/02/2017   CALCIUM 9.1 12/02/2017   Lab Results  Component Value Date   INR 1.0 05/01/2017   Lab Results  Component Value Date   CHOL 219 (H) 12/02/2017   HDL 58 12/02/2017   LDLCALC 137 (H) 12/02/2017   TRIG 119 12/02/2017     GEN- The patient is well appearing, alert and oriented x 3 today.   Head- normocephalic, atraumatic Eyes-  Sclera clear, conjunctiva pink Ears- hearing intact Oropharynx- clear Neck- supple, no JVP Lymph- no cervical lymphadenopathy Lungs- Clear to ausculation bilaterally, normal work of breathing Heart- Regular rate and rhythm, no murmurs, rubs or gallops, PMI not laterally displaced GI- soft, NT, ND, + BS Extremities- no clubbing, cyanosis, or edema MS- no significant deformity or atrophy Skin- no rash or lesion Psych- euthymic mood, full affect Neuro- strength and sensation are intact  EKG- NSR at 83 bpm, pr int 154 ms, qrs int 110 ms, qtc 467 ms, st/t wave abnormalities, old changes  Epic records reviewed [employer] rep in for interrogation, see HPI     Assessment and Plan: 1. Paroxysmal afib  In  SR today Recent increasing afib burden Did not tolerate increase in propafenone 2/2 neurotoxicity Wishes to have higher dose of CCB Will increase cardizem to 240 mg daily Warned that it could contribute to LLE Eliquis 5 mg bid  2. Presyncopal spell this past Monday Did not  have industry available to interrogate device Have asked device clinic to call pt and see if they can get her to send a report to look for any arrhythmia that may explain  her  spell  I will arrange for her to see Dr Caryl Comes in the next few weeks  Butch Penny C. Maralyn Witherell, [facility] [address] [place], [place] [address] [phone]

## 2018-04-05 NOTE — Telephone Encounter (Signed)
Transmission was not received.  LMTCB on Monday.sss

## 2018-04-05 NOTE — Patient Instructions (Signed)
On monitor push the grey button the front and then follow the pictures on front screen. The device clinic number is [phone]  Increase cardizem to 240mg  once a day

## 2018-04-08 NOTE — Telephone Encounter (Signed)
Remote transmission reviewed. Presenting rhythm: ApVs. 31.2% AT/AF burden since 02/07/18, max dur. 5hrs 73mins (04/05/18), Max Avg V 138bpm. (36) Fast A/V episodes, max dur. 43mins 18sec, last 04/02/18- AF/RVR.  Patient had an episode on 04/01/18 - Fast A/V episode - AF/RVR x67mins 18sec - Avg V rate 207bpm.  Called patient to inform her that remote was received and to let her know that she did have an episode on 5/6. Patient verbalized understanding. Patient states that she has been doing much better since the increase in her Diltiazem.   Will forward information to Roderic Palau, NP for review.  Patient also states that she's supposed to f/u with Dr.Klein in the near future per Roderic Palau. Will forward to scheduler for appt.

## 2018-04-08 NOTE — Telephone Encounter (Signed)
Patient returned my call. I asked patient to send a remote transmission. Verbal instructions provided. Encouraged patient to call back with any questions when she tries to send. Patient verbalized understanding.

## 2018-04-11 ENCOUNTER — Other Ambulatory Visit: Payer: Self-pay | Admitting: Cardiovascular Disease

## 2018-04-23 ENCOUNTER — Encounter: Payer: Self-pay | Admitting: Internal Medicine

## 2018-04-23 ENCOUNTER — Ambulatory Visit (INDEPENDENT_AMBULATORY_CARE_PROVIDER_SITE_OTHER): Payer: Medicare Other | Admitting: Internal Medicine

## 2018-04-23 VITALS — BP 110/80 | HR 71 | Ht 67.0 in | Wt 157.8 lb

## 2018-04-23 DIAGNOSIS — Z950 Presence of cardiac pacemaker: Secondary | ICD-10-CM | POA: Diagnosis not present

## 2018-04-23 DIAGNOSIS — I495 Sick sinus syndrome: Secondary | ICD-10-CM | POA: Diagnosis not present

## 2018-04-23 DIAGNOSIS — R6883 Chills (without fever): Secondary | ICD-10-CM | POA: Diagnosis not present

## 2018-04-23 DIAGNOSIS — I480 Paroxysmal atrial fibrillation: Secondary | ICD-10-CM | POA: Diagnosis not present

## 2018-04-23 DIAGNOSIS — G4452 New daily persistent headache (NDPH): Secondary | ICD-10-CM

## 2018-04-23 MED ORDER — PROPAFENONE HCL ER 225 MG PO CP12: 225.0000 mg | ORAL_CAPSULE | Freq: Two times a day (BID) | ORAL | 3 refills | Status: DC

## 2018-04-23 NOTE — Progress Notes (Signed)
HPI  [name] [name] is a 84 y.o. 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 Underwent generator replacement 5/18  Device interrogation demonstrated paroxysms of atrial fibrillation with ventricular rates into the 180s 190s.  She was started on low-dose beta blockers. Previously she did not tolerate metoprolol succinate.  She was also switched and calcium blockers which have been associated with some peripheral edema.   She was managed with propafenone from 2012.  Unfortunately, with increasing symptoms, we undertook dose up titration and she became propafenone neurally toxic.  2019.   It was down titrated.  Unfortunately, has not been able to hold sinus rhythm.   DATE TEST EF   8/15 Echo   55-65 %   1/19 Echo   55-65 % Mild LAE           Date Cr K TSH5.506 Hgb  6/18 0.89   13.6  1/19  1.01 3.5  13.3    She is struggling with frequent symptomatic atrial fibrillation.  Palpitations shortness of breath and dizziness.  When her heart gets fast, she has been taking as needed diltiazem with some benefit.  With her heart is in rhythm her exercise tolerance is stable.  Markedly less exercise tolerance with A. Fib  She would like to consider ablation   Past Medical History:  Diagnosis Date  . Atrial fibrillation ([place])   . CVA    Lacunar and cerebellar infarct  . Depression   . DVT (deep venous thrombosis) ([place])    Details not available in the chart  . Edema   . Mitral valve regurgitation    a. echo 2009 mild to moderate MR  . Sinoatrial node dysfunction ([facility])   . Unspecified essential hypertension     Past Surgical History:  Procedure Laterality Date  . PACEMAKER INSERTION  09-14-2005   MDT EnRhythm dual chamber pacemaker implanted by Dr Lovena Le for SND  . PPM GENERATOR CHANGEOUT N/A 05/07/2017   Procedure: PPM Generator Changeout;  Surgeon: Deboraha Sprang,  MD;  Location: [facility];  Service: Cardiovascular;  Laterality: N/A;    Current Outpatient Medications  Medication Sig Dispense Refill  . acetaminophen (TYLENOL) 650 MG CR tablet Take 650 mg by mouth every 8 (eight) hours as needed for pain.    Marland Kitchen allopurinol (ZYLOPRIM) 100 MG tablet Take 100 mg by mouth daily.    Marland Kitchen apixaban (ELIQUIS) 5 MG TABS tablet Take 1 tablet (5 mg total) by mouth 2 (two) times daily. 60 tablet 10  . Apoaequorin (PREVAGEN) 10 MG CAPS Take 10 mg by mouth daily.     . Cholecalciferol (VITAMIN D3) 2000 UNITS TABS Take 2,000 Units by mouth daily.     Marland Kitchen desonide (DESOWEN) 0.05 % ointment Apply 1 application topically 2 (two) times daily as needed (dry skin).    Marland Kitchen diltiazem (CARDIZEM) 30 MG tablet TAKE 1 TABLET ONCE DAILY AS NEEDED FOR PALPATATION. 45 tablet 0  . diltiazem (CARDIZEM) 30 MG tablet Take 30 mg by mouth as needed.    . folic acid (FOLVITE) 1 MG tablet Take 1 mg by mouth once a week.     Marland Kitchen guaiFENesin (MUCINEX) 600 MG 12 hr tablet Take 1 tablet (600 mg total) by mouth 2 (two) times daily as needed for cough or to loosen phlegm. 30 tablet 0  . loratadine (CLARITIN) 10 MG tablet Take 1 tablet (10 mg total) by  mouth daily. For allergies, congestion 30 tablet 3  . LORazepam (ATIVAN) 0.5 MG tablet Take 0.5 mg by mouth at bedtime as needed for sleep.     . Magnesium Oxide 400 (240 Mg) MG TABS Take 1 tablet (400 mg total) by mouth daily. 90 tablet 3  . Polyvinyl Alcohol-Povidone (REFRESH OP) Apply 1 drop to eye 2 (two) times daily as needed (dry eyes).    . sodium chloride (OCEAN) 0.65 % SOLN nasal spray Place 1 spray into both nostrils as needed for congestion.    . propafenone (RYTHMOL SR) 225 MG 12 hr capsule Take 1 capsule (225 mg total) by mouth 2 (two) times daily. 180 capsule 3   No current facility-administered medications for this visit.     Allergies  Allergen Reactions  . Gluten Meal Diarrhea  . Itraconazole Diarrhea and Itching  . Lactose  Intolerance (Gi) Diarrhea and Nausea Only    Pt unsure  . Statins     Severe Muscle ache,can't tolerate     Review of Systems negative except from HPI and PMH  Physical Exam BP 110/80   Pulse 71   Ht 5\' 7"  (1.702 m)   Wt 157 lb 12.8 oz (71.6 kg)   SpO2 98%   BMI 24.71 kg/m  Well developed and nourished in no acute distress HENT normal Neck supple with JVP-flat Clear Regular rate and rhythm, no murmurs or gallops Abd-soft with active BS No Clubbing cyanosis edema Skin-warm and dry A & Oriented  Grossly normal sensory and motor function   ECG sinus @ 71 14/12/43  Assessment and  Plan  Atrial fibrillation-paroxysmal  Hypertension   Sinus node dysfunction   Right bundle branch block  Pacemaker-[employer]tr[employer] The patient's device was interrogated.  The information was reviewed. No changes were made in the programming.     She  Is struggling with frequent episodes of atrial fibrillation associated with lightheadedness.  Heart rates from interrogation up to 200 bpm and averages are over 100 bpm.  She is willing to try oral propafenone again as an intermediate strategy.  We discussed also catheter ablation and I reached out to Dr. Greggory Brandy to consider PVI.  Alternatively we could undertake AV junction ablation which I think would be significantly beneficial.  We have also discussed dofetilide and amiodarone.  She is more interested in a fix then more pharmacological efforts  More than 50% of 40 now but she may have been min was so chart spent in counseli hospitalng related to the above

## 2018-04-23 NOTE — Patient Instructions (Signed)
Medication Instructions:  Your physician has recommended you make the following change in your medication:   1. Begin Rhythmol, 225mg  capsule, two times per day.  Labwork: Your physician recommends that you return for lab work in: Blood Cultures  Testing/Procedures: Non-Cardiac CT scanning, (CAT scanning), is a noninvasive, special x-ray that produces cross-sectional images of the body using x-rays and a computer. CT scans help physicians diagnose and treat medical conditions. For some CT exams, a contrast material is used to enhance visibility in the area of the body being studied. CT scans provide greater clarity and reveal more details than regular x-ray exams.   Follow-Up: Your physician recommends that you schedule a follow-up appointment in: 3 months with Dr Caryl Comes  Any Other Special Instructions Will Be Listed Below (If Applicable).     If you need a refill on your cardiac medications before your next appointment, please call your pharmacy.

## 2018-04-24 DIAGNOSIS — Z950 Presence of cardiac pacemaker: Secondary | ICD-10-CM | POA: Diagnosis not present

## 2018-04-24 DIAGNOSIS — R6883 Chills (without fever): Secondary | ICD-10-CM | POA: Diagnosis not present

## 2018-04-24 DIAGNOSIS — I480 Paroxysmal atrial fibrillation: Secondary | ICD-10-CM | POA: Diagnosis not present

## 2018-04-24 DIAGNOSIS — I495 Sick sinus syndrome: Secondary | ICD-10-CM | POA: Diagnosis not present

## 2018-04-24 LAB — CUP PACEART INCLINIC DEVICE CHECK
Battery Remaining Longevity: 166 mo
Battery Voltage: 3.08 V
Brady Statistic AP VP Percent: 0.13 %
Brady Statistic AS VS Percent: 45.01 %
Brady Statistic RV Percent Paced: 2.12 %
Implantable Lead Implant Date: 20061019
Implantable Lead Location: 753860
Implantable Pulse Generator Implant Date: 20180611
Lead Channel Impedance Value: 323 Ohm
Lead Channel Impedance Value: 380 Ohm
Lead Channel Impedance Value: 608 Ohm
Lead Channel Pacing Threshold Amplitude: 0.5 V
Lead Channel Pacing Threshold Amplitude: 0.75 V
Lead Channel Sensing Intrinsic Amplitude: 3.125 mV
Lead Channel Setting Pacing Amplitude: 1.5 V
Lead Channel Setting Sensing Sensitivity: 0.9 mV
MDC IDC LEAD IMPLANT DT: 20061019
MDC IDC LEAD LOCATION: 753859
MDC IDC MSMT LEADCHNL RA IMPEDANCE VALUE: 551 Ohm
MDC IDC MSMT LEADCHNL RA PACING THRESHOLD PULSEWIDTH: 0.4 ms
MDC IDC MSMT LEADCHNL RA SENSING INTR AMPL: 3 mV
MDC IDC MSMT LEADCHNL RV PACING THRESHOLD PULSEWIDTH: 0.4 ms
MDC IDC MSMT LEADCHNL RV SENSING INTR AMPL: 2.875 mV
MDC IDC MSMT LEADCHNL RV SENSING INTR AMPL: 3.25 mV
MDC IDC SESS DTM: 20190528200920
MDC IDC SET LEADCHNL RV PACING AMPLITUDE: 2.5 V
MDC IDC SET LEADCHNL RV PACING PULSEWIDTH: 0.4 ms
MDC IDC STAT BRADY AP VS PERCENT: 54.97 %
MDC IDC STAT BRADY AS VP PERCENT: 0.05 %
MDC IDC STAT BRADY RA PERCENT PACED: 38.04 %

## 2018-04-25 ENCOUNTER — Ambulatory Visit (INDEPENDENT_AMBULATORY_CARE_PROVIDER_SITE_OTHER)
Admission: RE | Admit: 2018-04-25 | Discharge: 2018-04-25 | Disposition: A | Discharge status: Home / Self Care | Payer: Medicare Other | Source: Ambulatory Visit | Attending: Internal Medicine | Admitting: Internal Medicine

## 2018-04-25 DIAGNOSIS — G4452 New daily persistent headache (NDPH): Secondary | ICD-10-CM | POA: Diagnosis not present

## 2018-04-25 DIAGNOSIS — S0990XA Unspecified injury of head, initial encounter: Secondary | ICD-10-CM | POA: Diagnosis not present

## 2018-04-26 ENCOUNTER — Telehealth: Payer: Self-pay | Admitting: Internal Medicine

## 2018-04-26 NOTE — Telephone Encounter (Signed)
Pt calls today asking if she should discontinue her Diltiazem 240mg  qd before beginning her Propafenone ordered during her last OV on 5/28. After reviewing most recent notes, I did not see the 240mg  Dilt on her med profile, however we did not instruct her to continue or discontinue. I told her I would forward her message to Dr Caryl Comes for clarification.

## 2018-04-26 NOTE — Telephone Encounter (Signed)
Would not continue dilt but will need to follow blood pressure

## 2018-04-26 NOTE — Telephone Encounter (Signed)
New Message  Pt c/o medication issue:  1. Name of Medication: propafenone (RYTHMOL SR) 225 MG 12 hr capsule  2. How are you currently taking this medication (dosage and times per day)? Take 1 capsule (225 mg total) by mouth 2 (two) times daily  3. Are you having a reaction (difficulty breathing--STAT)? no  4. What is your medication issue? Pt states she was switched to this medication and wants to know if she needs to stop her previous medication. Please call

## 2018-04-26 NOTE — Telephone Encounter (Signed)
LVM for pt, ok'd by her on the phone earlier today. Per Dr Caryl Comes, she needs to discontinue her Diltiazem 240mg  and begin her Propafenone. She should monitor her BP and let us know if it begins to increase.

## 2018-04-30 LAB — CULTURE, BLOOD (SINGLE)

## 2018-05-03 ENCOUNTER — Other Ambulatory Visit: Payer: Self-pay | Admitting: Internal Medicine

## 2018-05-06 ENCOUNTER — Telehealth: Payer: Self-pay

## 2018-05-06 NOTE — Telephone Encounter (Signed)
Pt is aware and agreeable to normal head CT and normal blood cultures. Pt was also reminded of upcoming appt on 6/24 with Dr. Rayann Heman for afib ablation consultation

## 2018-05-09 ENCOUNTER — Ambulatory Visit (INDEPENDENT_AMBULATORY_CARE_PROVIDER_SITE_OTHER): Payer: Medicare Other | Admitting: *Deleted

## 2018-05-09 DIAGNOSIS — I495 Sick sinus syndrome: Secondary | ICD-10-CM

## 2018-05-09 NOTE — Progress Notes (Signed)
Remote pacemaker transmission.   

## 2018-05-14 ENCOUNTER — Ambulatory Visit (HOSPITAL_COMMUNITY): Payer: Medicare Other | Admitting: Nurse Practitioner

## 2018-05-14 LAB — CUP PACEART REMOTE DEVICE CHECK
Battery Remaining Longevity: 165 mo
Battery Voltage: 3.08 V
Brady Statistic AS VS Percent: 71.39 %
Brady Statistic RA Percent Paced: 22.86 %
Implantable Lead Implant Date: 20061019
Implantable Lead Location: 753860
Implantable Pulse Generator Implant Date: 20180611
Lead Channel Impedance Value: 304 Ohm
Lead Channel Impedance Value: 551 Ohm
Lead Channel Impedance Value: 608 Ohm
Lead Channel Pacing Threshold Amplitude: 0.5 V
Lead Channel Pacing Threshold Amplitude: 0.875 V
Lead Channel Pacing Threshold Pulse Width: 0.4 ms
Lead Channel Pacing Threshold Pulse Width: 0.4 ms
Lead Channel Sensing Intrinsic Amplitude: 2.25 mV
Lead Channel Sensing Intrinsic Amplitude: 2.25 mV
Lead Channel Setting Pacing Amplitude: 1.5 V
Lead Channel Setting Sensing Sensitivity: 0.9 mV
MDC IDC LEAD IMPLANT DT: 20061019
MDC IDC LEAD LOCATION: 753859
MDC IDC MSMT LEADCHNL RV IMPEDANCE VALUE: 380 Ohm
MDC IDC MSMT LEADCHNL RV SENSING INTR AMPL: 4 mV
MDC IDC MSMT LEADCHNL RV SENSING INTR AMPL: 4 mV
MDC IDC SESS DTM: 20190613055243
MDC IDC SET LEADCHNL RV PACING AMPLITUDE: 2.5 V
MDC IDC SET LEADCHNL RV PACING PULSEWIDTH: 0.4 ms
MDC IDC STAT BRADY AP VP PERCENT: 0.02 %
MDC IDC STAT BRADY AP VS PERCENT: 28.65 %
MDC IDC STAT BRADY AS VP PERCENT: 0.03 %
MDC IDC STAT BRADY RV PERCENT PACED: 0.48 %

## 2018-05-20 ENCOUNTER — Ambulatory Visit (INDEPENDENT_AMBULATORY_CARE_PROVIDER_SITE_OTHER): Payer: Medicare Other | Admitting: Internal Medicine

## 2018-05-20 ENCOUNTER — Other Ambulatory Visit: Payer: Self-pay | Admitting: Internal Medicine

## 2018-05-20 ENCOUNTER — Encounter: Payer: Self-pay | Admitting: Internal Medicine

## 2018-05-20 ENCOUNTER — Encounter (INDEPENDENT_AMBULATORY_CARE_PROVIDER_SITE_OTHER): Payer: Self-pay

## 2018-05-20 VITALS — BP 124/64 | HR 133 | Ht 67.0 in | Wt 156.0 lb

## 2018-05-20 DIAGNOSIS — I480 Paroxysmal atrial fibrillation: Secondary | ICD-10-CM

## 2018-05-20 DIAGNOSIS — N950 Postmenopausal bleeding: Secondary | ICD-10-CM | POA: Insufficient documentation

## 2018-05-20 DIAGNOSIS — B373 Candidiasis of vulva and vagina: Secondary | ICD-10-CM | POA: Insufficient documentation

## 2018-05-20 DIAGNOSIS — L900 Lichen sclerosus et atrophicus: Secondary | ICD-10-CM | POA: Insufficient documentation

## 2018-05-20 DIAGNOSIS — B3731 Acute candidiasis of vulva and vagina: Secondary | ICD-10-CM | POA: Insufficient documentation

## 2018-05-20 DIAGNOSIS — N905 Atrophy of vulva: Secondary | ICD-10-CM | POA: Insufficient documentation

## 2018-05-20 MED ORDER — DILTIAZEM HCL ER COATED BEADS 120 MG PO CP24: 120.0000 mg | ORAL_CAPSULE | Freq: Every day | ORAL | 3 refills | Status: DC

## 2018-05-20 MED ORDER — AMIODARONE HCL 200 MG PO TABS: 200.0000 mg | ORAL_TABLET | Freq: Two times a day (BID) | ORAL | 1 refills | Status: DC

## 2018-05-20 NOTE — Patient Instructions (Addendum)
Medication Instructions:  Your physician has recommended you make the following change in your medication:  1. STOP Propafenone, in 48 hours - start Amiodarone 2. START Amiodarone 200 mg two times daily 2. START Diltiazem 120 mg once daily  *If you need a refill on your cardiac medications before your next appointment, please call your pharmacy*  Labwork: None ordered  Testing/Procedures: None ordered  Follow-Up: Remote monitoring is used to monitor your Pacemaker or ICD from home. This monitoring reduces the number of office visits required to check your device to one time per year. It allows [place] to keep an eye on the functioning of your device to ensure it is working properly. You are scheduled for a device check from home on 08/08/2018. You may send your transmission at any time that day. If you have a wireless device, the transmission will be sent automatically. After your physician reviews your transmission, you will receive a postcard with your next transmission date.  Your physician recommends that you schedule a follow-up appointment in: 2 weeks, with Roderic Palau NP in the AFib clinic.  Your physician recommends that you schedule a follow-up appointment in: 6 weeks with Dr. Rayann Heman.   Thank you for choosing [facility]!!

## 2018-05-20 NOTE — Progress Notes (Signed)
Electrophysiology Office Note   Date:  05/20/2018   ID:  [name] [name], DOB 1932-08-27, MRN [id number]  PCP:  Lajean Manes, MD    Primary Electrophysiologist: Dr Caryl Comes  CC: afib   History of Present Illness: [name] [name] is a 84 y.o. female who presents today for electrophysiology evaluation.   She presents today for further evaluation of her afib.  She reports initially being diagnosed with atrial fibrillation 2012 after presenting with palpitations and fatigue.  She has been treated with rhythmol.with reasonable success.  Her afib has recently increased in frequency and duration.  She reports symptoms of palpitations and fatigue with her afib.  Recent device remote reveals AF burden of 20.5%.  She has not required cardioversion.  She is in afib with RVR today.   Today, she denies symptoms of chest pain, shortness of breath, orthopnea, PND, lower extremity edema, claudication, dizziness, presyncope, syncope, bleeding, or neurologic sequela. The patient is tolerating medications without difficulties and is otherwise without complaint today.    Past Medical History:  Diagnosis Date  . CVA    Lacunar and cerebellar infarct  . Depression   . DVT (deep venous thrombosis) ([place])    Details not available in the chart  . Edema   . Mitral valve regurgitation    a. echo 2009 mild to moderate MR  . Paroxysmal atrial fibrillation (HCC)   . Sinoatrial node dysfunction (HCC)   . Unspecified essential hypertension    Past Surgical History:  Procedure Laterality Date  . PACEMAKER INSERTION  09-14-2005   MDT EnRhythm dual chamber pacemaker implanted by Dr Lovena Le for SND  . PPM GENERATOR CHANGEOUT N/A 05/07/2017   Procedure: PPM Generator Changeout;  Surgeon: Deboraha Sprang, MD;  Location: [facility];  Service: Cardiovascular;  Laterality: N/A;     Current Outpatient Medications  Medication Sig Dispense Refill  . acetaminophen (TYLENOL) 650 MG CR tablet Take 650  mg by mouth every 8 (eight) hours as needed for pain.    Marland Kitchen allopurinol (ZYLOPRIM) 100 MG tablet Take 100 mg by mouth daily.    Marland Kitchen apixaban (ELIQUIS) 5 MG TABS tablet Take 1 tablet (5 mg total) by mouth 2 (two) times daily. 60 tablet 10  . Apoaequorin (PREVAGEN) 10 MG CAPS Take 10 mg by mouth daily.     . Cholecalciferol (VITAMIN D3) 2000 UNITS TABS Take 2,000 Units by mouth daily.     Marland Kitchen desonide (DESOWEN) 0.05 % ointment Apply 1 application topically 2 (two) times daily as needed (dry skin).    Marland Kitchen diltiazem (CARDIZEM) 30 MG tablet TAKE 1 TABLET ONCE DAILY AS NEEDED FOR PALPATATION. 45 tablet 0  . diltiazem (CARDIZEM) 30 MG tablet Take 30 mg by mouth as needed.    . folic acid (FOLVITE) 1 MG tablet Take 1 mg by mouth once a week.     Marland Kitchen guaiFENesin (MUCINEX) 600 MG 12 hr tablet Take 1 tablet (600 mg total) by mouth 2 (two) times daily as needed for cough or to loosen phlegm. 30 tablet 0  . loratadine (CLARITIN) 10 MG tablet Take 1 tablet (10 mg total) by mouth daily. For allergies, congestion 30 tablet 3  . LORazepam (ATIVAN) 0.5 MG tablet Take 0.5 mg by mouth at bedtime as needed for sleep.     . Magnesium Oxide 400 (240 Mg) MG TABS Take 1 tablet (400 mg total) by mouth daily. 90 tablet 3  . Polyvinyl Alcohol-Povidone (REFRESH OP) Apply 1 drop to eye  2 (two) times daily as needed (dry eyes).    . propafenone (RYTHMOL SR) 225 MG 12 hr capsule Take 1 capsule (225 mg total) by mouth 2 (two) times daily. 180 capsule 3  . sodium chloride (OCEAN) 0.65 % SOLN nasal spray Place 1 spray into both nostrils as needed for congestion.     No current facility-administered medications for this visit.     Allergies:   Gluten meal; Itraconazole; Lactose intolerance (gi); and Statins   Social History:  The patient  reports that she quit smoking about 33 years ago. She has never used smokeless tobacco. She reports that she drinks about 8.4 oz of alcohol per week. She reports that she does not use drugs.   Family  History:  The patient's  family history includes Cancer in her mother; Coronary artery disease in her sister and sister; Diabetes in her father; Hypertension in her father; Mitral valve prolapse in her mother; Stroke in her brother.    ROS:  Please see the history of present illness.   All other systems are personally reviewed and negative.    PHYSICAL EXAM: VS:  BP 124/64   Pulse (!) 133   Ht 5\' 7"  (1.702 m)   Wt 156 lb (70.8 kg)   BMI 24.43 kg/m  , BMI Body mass index is 24.43 kg/m. GEN: elderly, in no acute distress  HEENT: normal  Neck: no JVD, carotid bruits, or masses Cardiac: tachycardic irregular rhythm; no murmurs, rubs, or gallops,no edema  Respiratory:  clear to auscultation bilaterally, normal work of breathing GI: soft, nontender, nondistended, + BS MS: no deformity or atrophy  Skin: warm and dry  Neuro:  Strength and sensation are intact Psych: euthymic mood, full affect  EKG:  EKG is ordered today. The ekg ordered today is personally reviewed and shows afib with RBBB, V rates 133 bpm, Qtc 517 msec.   Recent Labs: 12/01/2017: B Natriuretic Peptide 98.2 12/02/2017: BUN 14; Creatinine, Ser 1.01; Hemoglobin 13.3; Platelets 128; Potassium 3.5; Sodium 135; TSH 5.506  personally reviewed   Lipid Panel     Component Value Date/Time   CHOL 219 (H) 12/02/2017 0322   TRIG 119 12/02/2017 0322   HDL 58 12/02/2017 0322   CHOLHDL 3.8 12/02/2017 0322   VLDL 24 12/02/2017 0322   LDLCALC 137 (H) 12/02/2017 0322   personally reviewed   Wt Readings from Last 3 Encounters:  05/20/18 156 lb (70.8 kg)  04/23/18 157 lb 12.8 oz (71.6 kg)  04/05/18 158 lb (71.7 kg)      Other studies personally reviewed: Additional studies/ records that were reviewed today include: Dr Aquilla Hacker notes, 05/01/18 device remote  Review of the above records today demonstrates: echo 12/02/17 reveals EF 55%, mild AI, mild LA enlargement, PAS 48 mm Hg, LA 41 mm   ASSESSMENT AND PLAN:  1.  Paroxysmal  atrial fibrillation The patient has symptomatic, recurrent atrial fibrillation. she has failed medical therapy with rhythmol.  Her afib is progressing by recent PPM remote.  Her overall burden is 20.5%.  V rates are quite elevated.  She is not currently on scheduled rate control (takes cardizem 30mg  prn) Chads2vasc score is 6.  she is anticoagulated with eliquis . Therapeutic strategies for afib including medicine (tikosyn, amiodarone) and ablation were discussed in detail with the patient today. Risk, benefits, and alternatives to each approach were discussed at length.  She worries about risks of anesthesia given her advanced age.  After long discussion, she is willing to try  amiodarone.  She understands risks associated with amiodarone which were discussed at length and is willing to proceed. I will therefore start amiodarone 200mg  BID at this time.  Start diltiazem CD 120mg  daily which can be further titrated if needed.    Follow-up:  AF clinic in 2 weeks to assess rate control and tolerance of Amiodarone.  Reduce amiodarone to 200mg  BID in 4 weeks.  I will see her in 6 weeks for further discussions.  If she does not tolerate amiodarone, she may be more willing to consider ablation.    SignedThompson Grayer, MD  05/20/2018 2:53 PM     [place] [address] [phone] (office) [phone] (fax)

## 2018-06-03 DIAGNOSIS — I480 Paroxysmal atrial fibrillation: Secondary | ICD-10-CM | POA: Diagnosis not present

## 2018-06-03 DIAGNOSIS — N183 Chronic kidney disease, stage 3 (moderate): Secondary | ICD-10-CM | POA: Diagnosis not present

## 2018-06-03 DIAGNOSIS — Z79899 Other long term (current) drug therapy: Secondary | ICD-10-CM | POA: Diagnosis not present

## 2018-06-03 DIAGNOSIS — J449 Chronic obstructive pulmonary disease, unspecified: Secondary | ICD-10-CM | POA: Diagnosis not present

## 2018-06-03 DIAGNOSIS — D696 Thrombocytopenia, unspecified: Secondary | ICD-10-CM | POA: Diagnosis not present

## 2018-06-04 ENCOUNTER — Ambulatory Visit (HOSPITAL_COMMUNITY)
Admission: RE | Admit: 2018-06-04 | Discharge: 2018-06-04 | Disposition: A | Discharge status: Home / Self Care | Payer: Medicare Other | Source: Ambulatory Visit | Attending: Nurse Practitioner | Admitting: Nurse Practitioner

## 2018-06-04 ENCOUNTER — Encounter (HOSPITAL_COMMUNITY): Payer: Self-pay | Admitting: Nurse Practitioner

## 2018-06-04 VITALS — BP 134/78 | HR 79 | Ht 67.0 in | Wt 156.0 lb

## 2018-06-04 DIAGNOSIS — I480 Paroxysmal atrial fibrillation: Secondary | ICD-10-CM | POA: Diagnosis not present

## 2018-06-04 DIAGNOSIS — Z7901 Long term (current) use of anticoagulants: Secondary | ICD-10-CM | POA: Diagnosis not present

## 2018-06-04 DIAGNOSIS — Z950 Presence of cardiac pacemaker: Secondary | ICD-10-CM | POA: Insufficient documentation

## 2018-06-04 DIAGNOSIS — I10 Essential (primary) hypertension: Secondary | ICD-10-CM | POA: Diagnosis not present

## 2018-06-04 DIAGNOSIS — Z883 Allergy status to other anti-infective agents status: Secondary | ICD-10-CM | POA: Insufficient documentation

## 2018-06-04 DIAGNOSIS — I4891 Unspecified atrial fibrillation: Secondary | ICD-10-CM | POA: Diagnosis present

## 2018-06-04 DIAGNOSIS — Z87891 Personal history of nicotine dependence: Secondary | ICD-10-CM | POA: Insufficient documentation

## 2018-06-04 DIAGNOSIS — Z8673 Personal history of transient ischemic attack (TIA), and cerebral infarction without residual deficits: Secondary | ICD-10-CM | POA: Insufficient documentation

## 2018-06-04 DIAGNOSIS — Z79899 Other long term (current) drug therapy: Secondary | ICD-10-CM | POA: Insufficient documentation

## 2018-06-04 DIAGNOSIS — F329 Major depressive disorder, single episode, unspecified: Secondary | ICD-10-CM | POA: Insufficient documentation

## 2018-06-04 DIAGNOSIS — I4510 Unspecified right bundle-branch block: Secondary | ICD-10-CM | POA: Diagnosis not present

## 2018-06-04 DIAGNOSIS — Z888 Allergy status to other drugs, medicaments and biological substances status: Secondary | ICD-10-CM | POA: Diagnosis not present

## 2018-06-04 MED ORDER — AMIODARONE HCL 200 MG PO TABS: 200.0000 mg | ORAL_TABLET | Freq: Every day | ORAL | 1 refills | Status: DC

## 2018-06-04 NOTE — Progress Notes (Signed)
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 female with a h/o PPM, CVA, paroxysmal afib that is in the afib clinic for f/u. She had been found to have increase in afib burden and her propafenone was increased. She started having symptoms of ataxia and was hospitalized. She was felt to be neurotoxic from increase in propafenone and her dose was lowered back to bid dosing form tid dosing. In the Afib clinic,the pt reports that she no longer has the ataxia,  she has noted a few episode of afib and takes the 30 mg  that makes the afib tolerable. Device interrogation showed afib burden  at 20 %, with RVR at times.   She was seen by [name] [name] at the end of June for consult for afib management. He stopped Rythmol and started amiodarone 200 mg bid. In the afib clinic today, she has noted less afib and has been feeling better. Tolerating amiodarone.  EKG today showing AV paced rhythm.   Today, she denies symptoms of palpitations, chest pain, shortness of breath, orthopnea, PND, lower extremity edema, dizziness, presyncope, syncope, or neurologic sequela. The patient is tolerating medications without difficulties and is otherwise without complaint today.   Past Medical History:  Diagnosis Date  . CVA    Lacunar and cerebellar infarct  . Depression   . DVT (deep venous thrombosis) ([place])    Details not available in the chart  . Edema   . Mitral valve regurgitation    a. echo 2009 mild to moderate MR  . Paroxysmal atrial fibrillation ([facility])   . Sinoatrial node dysfunction ([facility])   . Unspecified essential hypertension    Past Surgical History:  Procedure Laterality Date  . PACEMAKER INSERTION  09-14-2005   MDT EnRhythm dual chamber pacemaker implanted by Dr Lovena Le for SND  . PPM GENERATOR CHANGEOUT N/A 05/07/2017   Procedure: PPM Generator Changeout;  Surgeon: Deboraha Sprang, MD;  Location: [place] [facility];  Service:  Cardiovascular;  Laterality: N/A;    Current Outpatient Medications  Medication Sig Dispense Refill  . acetaminophen (TYLENOL) 650 MG CR tablet Take 650 mg by mouth every 8 (eight) hours as needed for pain.    Marland Kitchen allopurinol (ZYLOPRIM) 100 MG tablet Take 100 mg by mouth daily.    Marland Kitchen amiodarone (PACERONE) 200 MG tablet Take 1 tablet (200 mg total) by mouth daily. 60 tablet 1  . apixaban (ELIQUIS) 5 MG TABS tablet Take 1 tablet (5 mg total) by mouth 2 (two) times daily. 60 tablet 10  . Apoaequorin (PREVAGEN) 10 MG CAPS Take 10 mg by mouth daily.     . Cholecalciferol (VITAMIN D3) 2000 UNITS TABS Take 2,000 Units by mouth daily.     Marland Kitchen desonide (DESOWEN) 0.05 % ointment Apply 1 application topically 2 (two) times daily as needed (dry skin).    Marland Kitchen diltiazem (CARDIZEM CD) 120 MG 24 hr capsule Take 1 capsule (120 mg total) by mouth daily. 90 capsule 3  . diltiazem (CARDIZEM) 30 MG tablet TAKE 1 TABLET ONCE DAILY AS NEEDED FOR PALPATATION. 45 tablet 0  . folic acid (FOLVITE) 1 MG tablet Take 1 mg by mouth once a week.     . loratadine (CLARITIN) 10 MG tablet Take 1 tablet (10 mg total) by mouth daily. For allergies, congestion 30 tablet 3  . LORazepam (ATIVAN) 0.5 MG tablet Take 0.5 mg by mouth at bedtime as needed for sleep.     Marland Kitchen  Magnesium Oxide 400 (240 Mg) MG TABS Take 1 tablet (400 mg total) by mouth daily. 90 tablet 3  . Polyvinyl Alcohol-Povidone (REFRESH OP) Apply 1 drop to eye 2 (two) times daily as needed (dry eyes).    . sodium chloride (OCEAN) 0.65 % SOLN nasal spray Place 1 spray into both nostrils as needed for congestion.    Marland Kitchen guaiFENesin (MUCINEX) 600 MG 12 hr tablet Take 1 tablet (600 mg total) by mouth 2 (two) times daily as needed for cough or to loosen phlegm. (Patient not taking: Reported on 06/04/2018) 30 tablet 0   No current facility-administered medications for this encounter.     Allergies  Allergen Reactions  . Gluten Meal Diarrhea  . Itraconazole Diarrhea and Itching  .  Lactose Intolerance (Gi) Diarrhea and Nausea Only    Pt unsure  . Statins     Severe Muscle ache,can't tolerate     Social History   Socioeconomic History  . Marital status: Widowed    Spouse name: Not on file  . Number of children: Not on file  . Years of education: Not on file  . Highest education level: Not on file  Occupational History  . Not on file  Social Needs  . Financial resource strain: Not on file  . Food insecurity:    Worry: Not on file    Inability: Not on file  . Transportation needs:    Medical: Not on file    Non-medical: Not on file  Tobacco Use  . Smoking status: Former Smoker    Last attempt to quit: 08/13/1984    Years since quitting: 33.8  . Smokeless tobacco: Never Used  Substance and Sexual Activity  . Alcohol use: Yes    Alcohol/week: 8.4 oz    Types: 14 Glasses of wine per week  . Drug use: No  . Sexual activity: Not Currently  Lifestyle  . Physical activity:    Days per week: Not on file    Minutes per session: Not on file  . Stress: Not on file  Relationships  . Social connections:    Talks on phone: Not on file    Gets together: Not on file    Attends religious service: Not on file    Active member of club or organization: Not on file    Attends meetings of clubs or organizations: Not on file    Relationship status: Not on file  . Intimate partner violence:    Fear of current or ex partner: Not on file    Emotionally abused: Not on file    Physically abused: Not on file    Forced sexual activity: Not on file  Other Topics Concern  . Not on file  Social History Narrative  . Not on file    Family History  Problem Relation Age of Onset  . Cancer Mother   . Mitral valve prolapse Mother   . Diabetes Father   . Hypertension Father   . Coronary artery disease Sister   . Stroke Brother   . Coronary artery disease Sister     ROS- All systems are reviewed and negative except as per the HPI above  Physical Exam: Vitals:    06/04/18 1445  BP: 134/78  Pulse: 79  Weight: 156 lb (70.8 kg)  Height: 5\' 7"  (1.702 m)   Wt Readings from Last 3 Encounters:  06/04/18 156 lb (70.8 kg)  05/20/18 156 lb (70.8 kg)  04/23/18 157 lb 12.8 oz (71.6 kg)  Labs: Lab Results  Component Value Date   NA 135 12/02/2017   K 3.5 12/02/2017   CL 105 12/02/2017   CO2 23 12/02/2017   GLUCOSE 140 (H) 12/02/2017   BUN 14 12/02/2017   CREATININE 1.01 (H) 12/02/2017   CALCIUM 9.1 12/02/2017   Lab Results  Component Value Date   INR 1.0 05/01/2017   Lab Results  Component Value Date   CHOL 219 (H) 12/02/2017   HDL 58 12/02/2017   LDLCALC 137 (H) 12/02/2017   TRIG 119 12/02/2017     GEN- The patient is well appearing, alert and oriented x 3 today.   Head- normocephalic, atraumatic Eyes-  Sclera clear, conjunctiva pink Ears- hearing intact Oropharynx- clear Neck- supple, no JVP Lymph- no cervical lymphadenopathy Lungs- Clear to ausculation bilaterally, normal work of breathing Heart- Regular rate and rhythm, no murmurs, rubs or gallops, PMI not laterally displaced GI- soft, NT, ND, + BS Extremities- no clubbing, cyanosis, or edema MS- no significant deformity or atrophy Skin- no rash or lesion Psych- euthymic mood, full affect Neuro- strength and sensation are intact  EKG-a paced at 79 bpm, pr int 180 ms, qrs int 118 ms, qtc 472 ms Epic records reviewed [employer] rep in for interrogation, see HPI     Assessment and Plan: 1. Paroxysmal afib  Recent increase in afib burden Did not tolerate increase in propafenone 2/2 neurotoxicity Rythmol stopped and now on amiodarone 200 mg bid for loading dose As of 7/26, she will reduce dose to 200 mg qd Feels improved Continue eliquis 5 mg bid for chadsvasc score of at least 5  F/u with [name] [name] 8/5 and per recall Dr. Johnsie Cancel in November  and Dr. Caryl Comes in December afib clinic as needed  [name] [name]. [name] [name], [facility] [address] [place], [place] [address] [phone]

## 2018-06-04 NOTE — Patient Instructions (Signed)
On July 26th -- decrease amiodarone 200mg  once a  day

## 2018-06-24 ENCOUNTER — Ambulatory Visit (INDEPENDENT_AMBULATORY_CARE_PROVIDER_SITE_OTHER)
Admission: EM | Admit: 2018-06-24 | Discharge: 2018-06-24 | Disposition: A | Discharge status: Home / Self Care | Payer: Medicare Other | Source: Home / Self Care | Attending: Family Medicine | Admitting: Family Medicine

## 2018-06-24 ENCOUNTER — Encounter (HOSPITAL_COMMUNITY): Payer: Self-pay | Admitting: Emergency Medicine

## 2018-06-24 ENCOUNTER — Encounter (HOSPITAL_COMMUNITY): Payer: Self-pay | Admitting: Internal Medicine

## 2018-06-24 ENCOUNTER — Other Ambulatory Visit: Payer: Self-pay

## 2018-06-24 ENCOUNTER — Ambulatory Visit (INDEPENDENT_AMBULATORY_CARE_PROVIDER_SITE_OTHER): Payer: Medicare Other

## 2018-06-24 ENCOUNTER — Inpatient Hospital Stay (HOSPITAL_COMMUNITY)
Admission: EM | Admit: 2018-06-24 | Discharge: 2018-06-26 | DRG: 200 | Disposition: A | Discharge status: Home Health | Payer: Medicare Other | Source: Skilled Nursing Facility | Attending: Family Medicine | Admitting: Family Medicine

## 2018-06-24 DIAGNOSIS — Y92007 Garden or yard of unspecified non-institutional (private) residence as the place of occurrence of the external cause: Secondary | ICD-10-CM

## 2018-06-24 DIAGNOSIS — Z66 Do not resuscitate: Secondary | ICD-10-CM | POA: Diagnosis present

## 2018-06-24 DIAGNOSIS — Z8673 Personal history of transient ischemic attack (TIA), and cerebral infarction without residual deficits: Secondary | ICD-10-CM

## 2018-06-24 DIAGNOSIS — R17 Unspecified jaundice: Secondary | ICD-10-CM | POA: Diagnosis not present

## 2018-06-24 DIAGNOSIS — T797XXA Traumatic subcutaneous emphysema, initial encounter: Secondary | ICD-10-CM

## 2018-06-24 DIAGNOSIS — E782 Mixed hyperlipidemia: Secondary | ICD-10-CM | POA: Diagnosis present

## 2018-06-24 DIAGNOSIS — S270XXA Traumatic pneumothorax, initial encounter: Secondary | ICD-10-CM

## 2018-06-24 DIAGNOSIS — Z888 Allergy status to other drugs, medicaments and biological substances status: Secondary | ICD-10-CM | POA: Diagnosis not present

## 2018-06-24 DIAGNOSIS — I4891 Unspecified atrial fibrillation: Secondary | ICD-10-CM | POA: Diagnosis not present

## 2018-06-24 DIAGNOSIS — S2249XA Multiple fractures of ribs, unspecified side, initial encounter for closed fracture: Secondary | ICD-10-CM | POA: Diagnosis present

## 2018-06-24 DIAGNOSIS — I495 Sick sinus syndrome: Secondary | ICD-10-CM | POA: Diagnosis present

## 2018-06-24 DIAGNOSIS — R296 Repeated falls: Secondary | ICD-10-CM | POA: Diagnosis not present

## 2018-06-24 DIAGNOSIS — E739 Lactose intolerance, unspecified: Secondary | ICD-10-CM | POA: Diagnosis present

## 2018-06-24 DIAGNOSIS — W010XXA Fall on same level from slipping, tripping and stumbling without subsequent striking against object, initial encounter: Secondary | ICD-10-CM | POA: Diagnosis present

## 2018-06-24 DIAGNOSIS — E875 Hyperkalemia: Secondary | ICD-10-CM | POA: Diagnosis not present

## 2018-06-24 DIAGNOSIS — Z91018 Allergy to other foods: Secondary | ICD-10-CM

## 2018-06-24 DIAGNOSIS — Z87891 Personal history of nicotine dependence: Secondary | ICD-10-CM | POA: Diagnosis not present

## 2018-06-24 DIAGNOSIS — E876 Hypokalemia: Secondary | ICD-10-CM | POA: Diagnosis not present

## 2018-06-24 DIAGNOSIS — Z86718 Personal history of other venous thrombosis and embolism: Secondary | ICD-10-CM

## 2018-06-24 DIAGNOSIS — S2242XA Multiple fractures of ribs, left side, initial encounter for closed fracture: Secondary | ICD-10-CM | POA: Diagnosis not present

## 2018-06-24 DIAGNOSIS — Z950 Presence of cardiac pacemaker: Secondary | ICD-10-CM | POA: Diagnosis not present

## 2018-06-24 DIAGNOSIS — I340 Nonrheumatic mitral (valve) insufficiency: Secondary | ICD-10-CM | POA: Diagnosis present

## 2018-06-24 DIAGNOSIS — I10 Essential (primary) hypertension: Secondary | ICD-10-CM | POA: Diagnosis present

## 2018-06-24 DIAGNOSIS — I480 Paroxysmal atrial fibrillation: Secondary | ICD-10-CM | POA: Diagnosis present

## 2018-06-24 DIAGNOSIS — Z823 Family history of stroke: Secondary | ICD-10-CM | POA: Diagnosis not present

## 2018-06-24 DIAGNOSIS — S2232XA Fracture of one rib, left side, initial encounter for closed fracture: Secondary | ICD-10-CM | POA: Diagnosis not present

## 2018-06-24 DIAGNOSIS — D72829 Elevated white blood cell count, unspecified: Secondary | ICD-10-CM | POA: Diagnosis present

## 2018-06-24 DIAGNOSIS — R079 Chest pain, unspecified: Secondary | ICD-10-CM | POA: Diagnosis not present

## 2018-06-24 DIAGNOSIS — Z8249 Family history of ischemic heart disease and other diseases of the circulatory system: Secondary | ICD-10-CM

## 2018-06-24 DIAGNOSIS — J939 Pneumothorax, unspecified: Secondary | ICD-10-CM

## 2018-06-24 LAB — CBC WITH DIFFERENTIAL/PLATELET
Abs Immature Granulocytes: 0.1 10*3/uL (ref 0.0–0.1)
BASOS ABS: 0.1 10*3/uL (ref 0.0–0.1)
BASOS PCT: 0 %
EOS PCT: 1 %
Eosinophils Absolute: 0.1 10*3/uL (ref 0.0–0.7)
HCT: 50.1 % — ABNORMAL HIGH (ref 36.0–46.0)
Hemoglobin: 16 g/dL — ABNORMAL HIGH (ref 12.0–15.0)
Immature Granulocytes: 1 %
Lymphocytes Relative: 4 %
Lymphs Abs: 0.6 10*3/uL — ABNORMAL LOW (ref 0.7–4.0)
MCH: 31.5 pg (ref 26.0–34.0)
MCHC: 31.9 g/dL (ref 30.0–36.0)
MCV: 98.6 fL (ref 78.0–100.0)
Monocytes Absolute: 0.8 10*3/uL (ref 0.1–1.0)
Monocytes Relative: 5 %
Neutro Abs: 13.5 10*3/uL — ABNORMAL HIGH (ref 1.7–7.7)
Neutrophils Relative %: 89 %
PLATELETS: 144 10*3/uL — AB (ref 150–400)
RBC: 5.08 MIL/uL (ref 3.87–5.11)
RDW: 14.5 % (ref 11.5–15.5)
WBC: 15.2 10*3/uL — ABNORMAL HIGH (ref 4.0–10.5)

## 2018-06-24 LAB — COMPREHENSIVE METABOLIC PANEL
ALT: 29 U/L (ref 0–44)
ANION GAP: 13 (ref 5–15)
AST: 52 U/L — ABNORMAL HIGH (ref 15–41)
Albumin: 4.3 g/dL (ref 3.5–5.0)
Alkaline Phosphatase: 83 U/L (ref 38–126)
BUN: 20 mg/dL (ref 8–23)
CALCIUM: 9.4 mg/dL (ref 8.9–10.3)
CHLORIDE: 106 mmol/L (ref 98–111)
CO2: 22 mmol/L (ref 22–32)
Creatinine, Ser: 1.08 mg/dL — ABNORMAL HIGH (ref 0.44–1.00)
GFR calc non Af Amer: 46 mL/min — ABNORMAL LOW (ref >60)
GFR, EST AFRICAN AMERICAN: 53 mL/min — AB (ref >60)
Glucose, Bld: 143 mg/dL — ABNORMAL HIGH (ref 70–99)
Potassium: 5.2 mmol/L — ABNORMAL HIGH (ref 3.5–5.1)
SODIUM: 141 mmol/L (ref 135–145)
Total Bilirubin: 1.9 mg/dL — ABNORMAL HIGH (ref 0.3–1.2)
Total Protein: 7 g/dL (ref 6.5–8.1)

## 2018-06-24 LAB — TROPONIN I: Troponin I: <0.03 ng/mL (ref <0.03)

## 2018-06-24 MED ORDER — ACETAMINOPHEN 650 MG RE SUPP: 650.0000 mg | Freq: Four times a day (QID) | RECTAL | Status: DC | PRN

## 2018-06-24 MED ORDER — AMIODARONE HCL 200 MG PO TABS
200.0000 mg | ORAL_TABLET | Freq: Every day | ORAL | Status: DC
  Administered 2018-06-25 – 2018-06-26 (×2): 200 mg via ORAL
  Filled 2018-06-24 (×2): qty 1

## 2018-06-24 MED ORDER — ONDANSETRON HCL 4 MG/2ML IJ SOLN: 4.0000 mg | Freq: Four times a day (QID) | INTRAMUSCULAR | Status: DC | PRN

## 2018-06-24 MED ORDER — MORPHINE SULFATE (PF) 4 MG/ML IV SOLN
4.0000 mg | INTRAVENOUS | Status: DC | PRN
  Administered 2018-06-24: 4 mg via INTRAVENOUS
  Filled 2018-06-24: qty 1

## 2018-06-24 MED ORDER — MORPHINE SULFATE (PF) 4 MG/ML IV SOLN
1.0000 mg | INTRAVENOUS | Status: DC | PRN
  Administered 2018-06-26: 1 mg via INTRAVENOUS
  Filled 2018-06-24: qty 1

## 2018-06-24 MED ORDER — ACETAMINOPHEN 325 MG PO TABS
650.0000 mg | ORAL_TABLET | Freq: Four times a day (QID) | ORAL | Status: DC
  Administered 2018-06-24 – 2018-06-26 (×7): 650 mg via ORAL
  Filled 2018-06-24 (×7): qty 2

## 2018-06-24 MED ORDER — DILTIAZEM HCL ER COATED BEADS 120 MG PO CP24
120.0000 mg | ORAL_CAPSULE | Freq: Every day | ORAL | Status: DC
  Administered 2018-06-25 – 2018-06-26 (×2): 120 mg via ORAL
  Filled 2018-06-24 (×2): qty 1

## 2018-06-24 MED ORDER — DILTIAZEM HCL-DEXTROSE 100-5 MG/100ML-% IV SOLN (PREMIX)
5.0000 mg/h | INTRAVENOUS | Status: DC
  Administered 2018-06-24: 5 mg/h via INTRAVENOUS
  Filled 2018-06-24: qty 100

## 2018-06-24 MED ORDER — DILTIAZEM LOAD VIA INFUSION
15.0000 mg | Freq: Once | INTRAVENOUS | Status: AC
  Administered 2018-06-24: 15 mg via INTRAVENOUS
  Filled 2018-06-24: qty 15

## 2018-06-24 MED ORDER — LORATADINE 10 MG PO TABS
10.0000 mg | ORAL_TABLET | Freq: Every day | ORAL | Status: DC
  Administered 2018-06-25 – 2018-06-26 (×2): 10 mg via ORAL
  Filled 2018-06-24 (×2): qty 1

## 2018-06-24 MED ORDER — SENNA 8.6 MG PO TABS
1.0000 | ORAL_TABLET | Freq: Two times a day (BID) | ORAL | Status: DC
  Administered 2018-06-24 – 2018-06-26 (×4): 8.6 mg via ORAL
  Filled 2018-06-24 (×4): qty 1

## 2018-06-24 MED ORDER — APIXABAN 5 MG PO TABS
5.0000 mg | ORAL_TABLET | Freq: Two times a day (BID) | ORAL | Status: DC
Start: 2018-06-24 — End: 2018-06-26
  Administered 2018-06-24 – 2018-06-26 (×4): 5 mg via ORAL
  Filled 2018-06-24 (×4): qty 1

## 2018-06-24 MED ORDER — LORAZEPAM 0.5 MG PO TABS
0.2500 mg | ORAL_TABLET | Freq: Every evening | ORAL | Status: DC | PRN
Start: 2018-06-24 — End: 2018-06-26

## 2018-06-24 MED ORDER — ALLOPURINOL 100 MG PO TABS
100.0000 mg | ORAL_TABLET | Freq: Every day | ORAL | Status: DC
  Administered 2018-06-25 – 2018-06-26 (×2): 100 mg via ORAL
  Filled 2018-06-24 (×2): qty 1

## 2018-06-24 MED ORDER — SENNOSIDES-DOCUSATE SODIUM 8.6-50 MG PO TABS: 1.0000 | ORAL_TABLET | Freq: Every evening | ORAL | Status: DC | PRN

## 2018-06-24 MED ORDER — ONDANSETRON HCL 4 MG PO TABS: 4.0000 mg | ORAL_TABLET | Freq: Four times a day (QID) | ORAL | Status: DC | PRN

## 2018-06-24 MED ORDER — TRAMADOL HCL 50 MG PO TABS
50.0000 mg | ORAL_TABLET | Freq: Four times a day (QID) | ORAL | Status: DC | PRN
  Administered 2018-06-24 – 2018-06-26 (×5): 50 mg via ORAL
  Filled 2018-06-24 (×6): qty 1

## 2018-06-24 MED ORDER — SODIUM CHLORIDE 0.9 % IV SOLN
INTRAVENOUS | Status: DC
  Administered 2018-06-24: 23:00:00 via INTRAVENOUS

## 2018-06-24 MED ORDER — ACETAMINOPHEN 325 MG PO TABS: 650.0000 mg | ORAL_TABLET | Freq: Four times a day (QID) | ORAL | Status: DC | PRN

## 2018-06-24 MED ORDER — GUAIFENESIN ER 600 MG PO TB12
600.0000 mg | ORAL_TABLET | Freq: Two times a day (BID) | ORAL | Status: DC
  Administered 2018-06-24 – 2018-06-26 (×4): 600 mg via ORAL
  Filled 2018-06-24 (×5): qty 1

## 2018-06-24 NOTE — ED Provider Notes (Signed)
[place]    CSN: [id number] Arrival date & time: 06/24/18  1236     History   Chief Complaint Chief Complaint  Patient presents with  . Fall    HPI [name] [name] is a 84 y.o. female.   HPI  Patient is here for injury sustained in a fall at her home today.  She states she was bending over to prune 1 of her bushes and toppled towards the left.  She does not know if it was from weakness, or poor balance, but she landed with her left ribs on a large landscaping rock.  She states that she was initially in severe pain.  She had to crawl back into her house.  She called for assistance and is brought here for treatment.  She did not hit her head or lose conscious.  She has pain in the left ribs and left upper arm and shoulder area only.  No pain in neck back, hips knees.  She states that she is certain that she did not feel dizzy or faint, she is certain she did not have any chest pain or irregular heartbeat.  She does have pain now with deep breath.  She has no underlying lung disease.   Past Medical History:  Diagnosis Date  . CVA    Lacunar and cerebellar infarct  . Depression   . DVT (deep venous thrombosis) ([place])    Details not available in the chart  . Edema   . Mitral valve regurgitation    a. echo 2009 mild to moderate MR  . Paroxysmal atrial fibrillation (HCC)   . Sinoatrial node dysfunction (HCC)   . Unspecified essential hypertension     Patient Active Problem List   Diagnosis Date Noted  . Atrophic vulva 05/20/2018  . Candidal vulvovaginitis 05/20/2018  . Lichen sclerosus et atrophicus 05/20/2018  . Postmenopausal bleeding 05/20/2018  . Palpitations 12/02/2017  . TIA (transient ischemic attack) 12/02/2017  . PAF (paroxysmal atrial fibrillation) ([place]) 06/29/2014  . SVT (supraventricular tachycardia) ([place]) 06/29/2014  . Frequent falls 06/29/2014  . Chronic anticoagulation 06/29/2014  . Encounter for therapeutic drug monitoring 12/31/2013  .  Edema 10/21/2013  . Hair thinning 09/26/2012  . Mixed hyperlipidemia 02/15/2010  . PACEMAKER-Medtr[employer] 08/12/2009  . MITRAL INSUFFICIENCY 02/17/2009  . Essential hypertension 02/17/2009  . SICK SINUS SYNDROME 02/17/2009  . DVT 02/17/2009    Past Surgical History:  Procedure Laterality Date  . PACEMAKER INSERTION  09-14-2005   MDT EnRhythm dual chamber pacemaker implanted by Dr Lovena Le for SND  . PPM GENERATOR CHANGEOUT N/A 05/07/2017   Procedure: PPM Generator Changeout;  Surgeon: Deboraha Sprang, MD;  Location: [facility];  Service: Cardiovascular;  Laterality: N/A;    OB History   None      Home Medications    Prior to Admission medications   Medication Sig Start Date End Date Taking? Authorizing Provider  acetaminophen (TYLENOL) 650 MG CR tablet Take 650 mg by mouth every 8 (eight) hours as needed for pain.    [provider]  allopurinol (ZYLOPRIM) 100 MG tablet Take 100 mg by mouth daily.    [provider]  amiodarone (PACERONE) 200 MG tablet Take 1 tablet (200 mg total) by mouth daily. 06/04/18   Sherran Needs, NP  apixaban (ELIQUIS) 5 MG TABS tablet Take 1 tablet (5 mg total) by mouth 2 (two) times daily. 08/08/17   Josue Hector, MD  Apoaequorin (PREVAGEN) 10 MG CAPS Take 10  mg by mouth daily.     [provider]  Cholecalciferol (VITAMIN D3) 2000 UNITS TABS Take 2,000 Units by mouth daily.     [provider]  desonide (DESOWEN) 0.05 % ointment Apply 1 application topically 2 (two) times daily as needed (dry skin).    [provider]  diltiazem (CARDIZEM CD) 120 MG 24 hr capsule Take 1 capsule (120 mg total) by mouth daily. 05/20/18 08/18/18  Allred, Jeneen Rinks, MD  diltiazem (CARDIZEM) 30 MG tablet TAKE 1 TABLET ONCE DAILY AS NEEDED FOR PALPATATION. 04/11/18   Sherran Needs, NP  folic acid (FOLVITE) 1 MG tablet Take 1 mg by mouth once a week.     [provider]  loratadine (CLARITIN) 10 MG tablet Take 1 tablet  (10 mg total) by mouth daily. For allergies, congestion 12/03/17   Rai, Ripudeep K, MD  LORazepam (ATIVAN) 0.5 MG tablet Take 0.5 mg by mouth at bedtime as needed for sleep.     [provider]  Magnesium Oxide 400 (240 Mg) MG TABS Take 1 tablet (400 mg total) by mouth daily. 01/16/18   Deboraha Sprang, MD  Polyvinyl Alcohol-Povidone (REFRESH OP) Apply 1 drop to eye 2 (two) times daily as needed (dry eyes).    [provider]  sodium chloride (OCEAN) 0.65 % SOLN nasal spray Place 1 spray into both nostrils as needed for congestion.    [provider]    Family History Family History  Problem Relation Age of Onset  . Cancer Mother   . Mitral valve prolapse Mother   . Diabetes Father   . Hypertension Father   . Coronary artery disease Sister   . Stroke Brother   . Coronary artery disease Sister     Social History Social History   Tobacco Use  . Smoking status: Former Smoker    Last attempt to quit: 08/13/1984    Years since quitting: 33.8  . Smokeless tobacco: Never Used  Substance Use Topics  . Alcohol use: Yes    Alcohol/week: 8.4 oz    Types: 14 Glasses of wine per week  . Drug use: No     Allergies   Gluten meal; Itraconazole; Lactose intolerance (gi); and Statins   Review of Systems Review of Systems  Constitutional: Negative for chills and fever.  HENT: Negative for ear pain and sore throat.   Eyes: Negative for pain and visual disturbance.  Respiratory: Positive for shortness of breath. Negative for cough.   Cardiovascular: Positive for chest pain. Negative for palpitations.  Gastrointestinal: Negative for abdominal pain and vomiting.  Genitourinary: Negative for dysuria and hematuria.  Musculoskeletal: Negative for arthralgias and back pain.  Skin: Negative for color change and rash.  Neurological: Negative for dizziness, seizures and syncope.  All other systems reviewed and are negative.    Physical Exam Triage Vital Signs ED  Triage Vitals  Enc Vitals Group     BP 06/24/18 1308 (!) 158/95     Pulse Rate 06/24/18 1308 69     Resp 06/24/18 1308 18     Temp 06/24/18 1308 98.2 F (36.8 C)     Temp Source 06/24/18 1308 Oral     SpO2 06/24/18 1308 96 %     Weight 06/24/18 1334 156 lb (70.8 kg)     Height --      Head Circumference --      Peak Flow --      Pain Score 06/24/18 1334 9  Pain Loc --      Pain Edu? --      Excl. in [place]? --    No data found.  Updated Vital Signs BP (!) 169/80   Pulse 64   Temp 98.2 F (36.8 C) (Oral)   Resp 16   Wt 156 lb (70.8 kg)   SpO2 95%   BMI 24.43 kg/m       Physical Exam  Constitutional: She appears well-developed and well-nourished. No distress.  HENT:  Head: Normocephalic and atraumatic.  Mouth/Throat: Oropharynx is clear and moist.  Eyes: Pupils are equal, round, and reactive to light. Conjunctivae are normal.  Neck: Normal range of motion. No thyromegaly present.  No tenderness of her neck bony structures or muscles.  Good range of motion  Cardiovascular: Normal rate, regular rhythm and normal heart sounds.  Pulmonary/Chest: Effort normal and breath sounds normal. No respiratory distress.  Bruise noted in the mid axillary line, lower rib cage.  Ribs are very tender to palpation.  Abdominal: Soft. She exhibits no distension.  Musculoskeletal: Normal range of motion. She exhibits no edema.  Lymphadenopathy:    She has no cervical adenopathy.  Neurological: She is alert.  Skin: Skin is warm and dry.  Psychiatric: She has a normal mood and affect. Her behavior is normal.  Nursing note and vitals reviewed.    UC Treatments / Results  Labs (all labs ordered are listed, but only abnormal results are displayed) Labs Reviewed - No data to display  EKG None  Radiology Dg Ribs Unilateral W/chest Left  Result Date: 06/24/2018 CLINICAL DATA:  Fall with rib pain EXAM: LEFT RIBS AND CHEST - 3+ VIEW COMPARISON:  12/01/2017 FINDINGS: Single-view chest  demonstrates left-sided pacing device. Small left pleural effusion with left basilar mild airspace disease. Moderate subcutaneous emphysema in the left lateral chest wall soft tissues. Small left apical pneumothorax measuring 15 mm in pleuroparenchymal separation. No midline shift. Normal heart size. Aortic atherosclerosis. Left rib series demonstrates acute left fifth, and sixth rib fractures, acute displaced eighth ninth and tenth rib fractures. IMPRESSION: 1. Multiple acute left-sided rib fractures with small left apical pneumothorax, small amount of left pleural effusion and mild left basilar airspace disease which may reflect atelectasis or mild contusion. 2. Moderate soft tissue emphysema within the left chest wall soft tissues. Critical Value/emergent results were called by telephone at the time of interpretation on 06/24/2018 at 2:51 pm to Dr. Blanchie Serve , who verbally acknowledged these results. Electronically Signed   By: Donavan Foil M.D.   On: 06/24/2018 14:51    Procedures Procedures (including critical care time)  Medications Ordered in UC Medications - No data to display  Initial Impression / Assessment and Plan / UC Course  I have reviewed the triage vital signs and the nursing notes.  Pertinent labs & imaging results that were available during my care of the patient were reviewed by me and considered in my medical decision making (see chart for details).     I discussed with the patient that given her x-ray findings, hospitalization for observation at least overnight is recommended.  Call the emergency department and spoke with Dr. Ronald Pippins.  She was transported down there by a staff member from her living center. Final Clinical Impressions(s) / UC Diagnoses   Final diagnoses:  Closed fracture of five ribs of left side, initial encounter  Traumatic pneumothorax, initial encounter  Traumatic subcutaneous emphysema, initial encounter [facility])     Discharge Instructions  GO  TO ER   ED Prescriptions    None     Controlled Substance Prescriptions [place] Controlled Substance Registry consulted? Not Applicable   Raylene Everts, MD 06/24/18 [phone]

## 2018-06-24 NOTE — ED Notes (Signed)
Pt's monitor showing NSR at this time.  Pt also given Incentive Spirometer and instructions on use.

## 2018-06-24 NOTE — Consult Note (Signed)
Reason for Consult:fall, rib fx, ptx Referring Physician: Dr [name] [name] is an 84 y.o. female.  HPI: 84 year old female with history of paroxysmal atrial fibrillation, history of stroke on systemic anticoagulation was outside her house in a assisted living facility pruning her flowers when she had a ground-level fall earlier today.  She states that she landed on her left side.  She denies loss of consciousness or head trauma.  She had immediate discomfort in her left side and was unable to get up on her feet.  She crawled back into her house and hit her medical alert  button.  She was taken to urgent care for evaluation.  There she was found to have rib fractures with a small pneumothorax.  She was transferred to the emergency room for further evaluation.  She is found to be in RVR with respect to her A. fib.  She was started on a Cardizem drip.  She states that she has not had any recent falls.  She denies any loss of consciousness.  She denies any abdominal pain, extremity pain or back pain.  She denies any back pain.  The event happens more than 6 hours ago.  She only had plain films of her chest.  She took Eliquis earlier today.  She has a pacemaker. She reports normal eating and drinking for the past several days.  She complains of left-sided discomfort as well as discomfort with taking a deep breath.  Past Medical History:  Diagnosis Date  . CVA    Lacunar and cerebellar infarct  . Depression   . DVT (deep venous thrombosis) ([place])    Details not available in the chart  . Edema   . Mitral valve regurgitation    a. echo 2009 mild to moderate MR  . Paroxysmal atrial fibrillation (HCC)   . Sinoatrial node dysfunction (HCC)   . Unspecified essential hypertension     Past Surgical History:  Procedure Laterality Date  . PACEMAKER INSERTION  09-14-2005   MDT EnRhythm dual chamber pacemaker implanted by Dr Lovena Le for SND  . PPM GENERATOR CHANGEOUT N/A 05/07/2017   Procedure: PPM Generator Changeout;  Surgeon: Deboraha Sprang, MD;  Location: [facility];  Service: Cardiovascular;  Laterality: N/A;    Family History  Problem Relation Age of Onset  . Cancer Mother   . Mitral valve prolapse Mother   . Diabetes Father   . Hypertension Father   . Coronary artery disease Sister   . Stroke Brother   . Coronary artery disease Sister     Social History:  reports that she quit smoking about 33 years ago. She has never used smokeless tobacco. She reports that she drinks about 8.4 oz of alcohol per week. She reports that she does not use drugs.  Allergies:  Allergies  Allergen Reactions  . Gluten Meal Diarrhea  . Itraconazole Diarrhea and Itching  . Lactose Intolerance (Gi) Diarrhea and Nausea Only  . Statins Other (See Comments)    Severe muscle aches;,can't tolerate     Medications: I have reviewed the patient's current medications.  Results for orders placed or performed during the hospital encounter of 06/24/18 (from the past 48 hour(s))  CBC with Differential     Status: Abnormal   Collection Time: 06/24/18  4:50 PM  Result Value Ref Range   WBC 15.2 (H) 4.0 - 10.5 K/uL   RBC 5.08 3.87 - 5.11 MIL/uL   Hemoglobin 16.0 (H) 12.0 - 15.0 g/dL  HCT 50.1 (H) 36.0 - 46.0 %   MCV 98.6 78.0 - 100.0 fL   MCH 31.5 26.0 - 34.0 pg   MCHC 31.9 30.0 - 36.0 g/dL   RDW 14.5 11.5 - 15.5 %   Platelets 144 (L) 150 - 400 K/uL   Neutrophils Relative % 89 %   Neutro Abs 13.5 (H) 1.7 - 7.7 K/uL   Lymphocytes Relative 4 %   Lymphs Abs 0.6 (L) 0.7 - 4.0 K/uL   Monocytes Relative 5 %   Monocytes Absolute 0.8 0.1 - 1.0 K/uL   Eosinophils Relative 1 %   Eosinophils Absolute 0.1 0.0 - 0.7 K/uL   Basophils Relative 0 %   Basophils Absolute 0.1 0.0 - 0.1 K/uL   Immature Granulocytes 1 %   Abs Immature Granulocytes 0.1 0.0 - 0.1 K/uL    Comment: Performed at [facility] [address] [place], [place] [address]  Comprehensive metabolic panel      Status: Abnormal   Collection Time: 06/24/18  4:50 PM  Result Value Ref Range   Sodium 141 135 - 145 mmol/L   Potassium 5.2 (H) 3.5 - 5.1 mmol/L   Chloride 106 98 - 111 mmol/L   CO2 22 22 - 32 mmol/L   Glucose, Bld 143 (H) 70 - 99 mg/dL   BUN 20 8 - 23 mg/dL   Creatinine, Ser 1.08 (H) 0.44 - 1.00 mg/dL   Calcium 9.4 8.9 - 10.3 mg/dL   Total Protein 7.0 6.5 - 8.1 g/dL   Albumin 4.3 3.5 - 5.0 g/dL   AST 52 (H) 15 - 41 U/L   ALT 29 0 - 44 U/L   Alkaline Phosphatase 83 38 - 126 U/L   Total Bilirubin 1.9 (H) 0.3 - 1.2 mg/dL   GFR calc non Af Amer 46 (L) >60 mL/min   GFR calc Af Amer 53 (L) >60 mL/min    Comment: (NOTE) The eGFR has been calculated using the CKD EPI equation. This calculation has not been validated in all clinical situations. eGFR's persistently <60 mL/min signify possible Chronic Kidney Disease.    Anion gap 13 5 - 15    Comment: Performed at [facility] [address] [place], [place] [address]    Dg Ribs Unilateral W/chest Left  Result Date: 06/24/2018 CLINICAL DATA:  Fall with rib pain EXAM: LEFT RIBS AND CHEST - 3+ VIEW COMPARISON:  12/01/2017 FINDINGS: Single-view chest demonstrates left-sided pacing device. Small left pleural effusion with left basilar mild airspace disease. Moderate subcutaneous emphysema in the left lateral chest wall soft tissues. Small left apical pneumothorax measuring 15 mm in pleuroparenchymal separation. No midline shift. Normal heart size. Aortic atherosclerosis. Left rib series demonstrates acute left fifth, and sixth rib fractures, acute displaced eighth ninth and tenth rib fractures. IMPRESSION: 1. Multiple acute left-sided rib fractures with small left apical pneumothorax, small amount of left pleural effusion and mild left basilar airspace disease which may reflect atelectasis or mild contusion. 2. Moderate soft tissue emphysema within the left chest wall soft tissues. Critical Value/emergent results were called by telephone at  the time of interpretation on 06/24/2018 at 2:51 pm to Dr. Blanchie Serve , who verbally acknowledged these results. Electronically Signed   By: Donavan Foil M.D.   On: 06/24/2018 14:51    Review of Systems  Constitutional: Negative for weight loss.  HENT: Negative for nosebleeds.   Eyes: Negative for blurred vision.  Respiratory: Negative for shortness of breath.  C/o chest discomfort with deep breath  Cardiovascular: Negative for chest pain, palpitations, orthopnea and PND.       Denies DOE; has chronic afib; remote h/o cva. Takes eliquis bid.  Gastrointestinal: Negative for abdominal pain, nausea and vomiting.  Genitourinary: Negative for dysuria and hematuria.  Musculoskeletal: Negative.   Skin: Negative for itching and rash.  Neurological: Negative for dizziness, focal weakness, seizures, loss of consciousness and headaches.       Denies TIAs, amaurosis fugax  Endo/Heme/Allergies: Does not bruise/bleed easily.  Psychiatric/Behavioral: The patient is not nervous/anxious.    Blood pressure 140/88, pulse 99, temperature 98.3 F (36.8 C), temperature source Oral, resp. rate 17, SpO2 95 %. Physical Exam  Vitals reviewed. Constitutional: She is oriented to person, place, and time. She appears well-developed and well-nourished. She is cooperative. No distress. Cervical collar and nasal cannula in place.  HENT:  Head: Normocephalic and atraumatic. Head is without raccoon's eyes, without Battle's sign, without abrasion, without contusion and without laceration.  Right Ear: Hearing, tympanic membrane, external ear and ear canal normal. No lacerations. No drainage or tenderness. No foreign bodies. Tympanic membrane is not perforated. No hemotympanum.  Left Ear: Hearing, tympanic membrane, external ear and ear canal normal. No lacerations. No drainage or tenderness. No foreign bodies. Tympanic membrane is not perforated. No hemotympanum.  Nose: Nose normal. No nose lacerations, sinus  tenderness, nasal deformity or nasal septal hematoma. No epistaxis.  Mouth/Throat: Uvula is midline, oropharynx is clear and moist and mucous membranes are normal. No lacerations.  Eyes: Pupils are equal, round, and reactive to light. Conjunctivae, EOM and lids are normal. No scleral icterus.  Neck: Trachea normal. No JVD present. No spinous process tenderness and no muscular tenderness present. Carotid bruit is not present. No thyromegaly present.  Cardiovascular: Normal rate, regular rhythm, normal heart sounds, intact distal pulses and normal pulses.  Respiratory: Breath sounds normal. No respiratory distress. She has no wheezes. She has no rales. She exhibits tenderness and bony tenderness. She exhibits no laceration, no crepitus, no edema and no retraction.    GI: Soft. Normal appearance. She exhibits no distension. Bowel sounds are decreased. There is no tenderness. There is no rigidity, no rebound, no guarding and no CVA tenderness.  Musculoskeletal: Normal range of motion. She exhibits no edema or tenderness.  Lymphadenopathy:    She has no cervical adenopathy.  Neurological: She is alert and oriented to person, place, and time. She has normal strength. No cranial nerve deficit or sensory deficit. GCS eye subscore is 4. GCS verbal subscore is 5. GCS motor subscore is 6.  Skin: Skin is warm, dry and intact. Bruising noted. She is not diaphoretic.  Old bruises on forearm/ Rt LE  Psychiatric: She has a normal mood and affect. Her speech is normal and behavior is normal.    Assessment/Plan: Status post fall Left rib fractures 5, 6, 8, 9, 10 Small left pneumothorax Chronic paroxysmal atrial fibrillation now with RVR History of stroke ?dehydration   She has several hours out from her event.  Her GCS is 15.  She is appropriate.  Therefore I believe she does not need imaging of her head.  Respiratory rate is normal.  Her oxygenation level is good. ?dehydration - her hgb and hct is  elevated - baseline appears around 13.   Repeat chest x-ray in morning Maintain on oxygen, Continuous pulse ox Pulmonary toilet with incentive spirometer Adequate analgesia for pain control-recommend scheduled Tylenol, as needed Robaxin and opioid narcotic Stool softener  If her pulmonary status deteriorates or if her pneumothorax expands on chest x-ray will need a chest tube discussed rib fractures and pneumothorax with patient and her immediate family.  Leighton Ruff. Redmond Pulling, MD, FACS General, Bariatric, & Minimally Invasive Surgery [facility]   Greer Pickerel 06/24/2018, 7:42 PM

## 2018-06-24 NOTE — ED Triage Notes (Signed)
Pt takes Amiodarone, pt hx of a fib, pt HR 138 irregular in triage

## 2018-06-24 NOTE — ED Provider Notes (Signed)
[facility] EMERGENCY DEPARTMENT Provider Note   CSN: [id number] Arrival date & time: 06/24/18  1539     History   Chief Complaint Chief Complaint  Patient presents with  . Fall  . Chest Pain    HPI [name] [name] is a 84 y.o. female.  HPI Patient is an 84 year old female transferred to the emergency department from urgent care after being found to have multiple left-sided rib fractures and small apical pneumothorax.  She states that today she was cutting bushes at her house and she fell onto her left side.  She presents with moderate to severe left chest pain worse with palpation and movement.  She was also found to be in rapid atrial fibrillation at the urgent care center was sent to the ER for further evaluation.  Patient is on amiodarone and calcium channel blockers and reports compliance with her medications.  She is compliant with her anticoagulation as well.  She has no chest pain or shortness of breath at this time.  She presents with a heart rate of 140.  Pain in her chest is moderate to severe in severity. Past Medical History:  Diagnosis Date  . CVA    Lacunar and cerebellar infarct  . Depression   . DVT (deep venous thrombosis) ([place])    Details not available in the chart  . Edema   . Mitral valve regurgitation    a. echo 2009 mild to moderate MR  . Paroxysmal atrial fibrillation (H[facility])   . Sinoatrial node dysfunction (HCC)   . Unspecified essential hypertension     Patient Active Problem List   Diagnosis Date Noted  . Atrial fibrillation with RVR ([place]) 06/24/2018  . Multiple rib fractures 06/24/2018  . Leukocytosis 06/24/2018  . Hyperkalemia 06/24/2018  . Pneumothorax, closed, traumatic 06/24/2018  . Atrophic vulva 05/20/2018  . Candidal vulvovaginitis 05/20/2018  . Lichen sclerosus et atrophicus 05/20/2018  . Postmenopausal bleeding 05/20/2018  . Palpitations 12/02/2017  . TIA (transient ischemic attack) 12/02/2017  . PAF  (paroxysmal atrial fibrillation) ([place]) 06/29/2014  . SVT (supraventricular tachycardia) ([place]) 06/29/2014  . Frequent falls 06/29/2014  . Chronic anticoagulation 06/29/2014  . Encounter for therapeutic drug monitoring 12/31/2013  . Edema 10/21/2013  . Hair thinning 09/26/2012  . Mixed hyperlipidemia 02/15/2010  . PACEMAKER-[employer]tr[employer] 08/12/2009  . MITRAL INSUFFICIENCY 02/17/2009  . Essential hypertension 02/17/2009  . SICK SINUS SYNDROME 02/17/2009  . DVT 02/17/2009    Past Surgical History:  Procedure Laterality Date  . PACEMAKER INSERTION  09-14-2005   MDT EnRhythm dual chamber pacemaker implanted by Dr Lovena Le for SND  . PPM GENERATOR CHANGEOUT N/A 05/07/2017   Procedure: PPM Generator Changeout;  Surgeon: Deboraha Sprang, MD;  Location: [facility];  Service: Cardiovascular;  Laterality: N/A;     OB History   None      Home Medications    Prior to Admission medications   Medication Sig Start Date End Date Taking? Authorizing Provider  allopurinol (ZYLOPRIM) 100 MG tablet Take 100 mg by mouth daily.   Yes [provider]  Aloe Vera GEL Apply 1 application topically as needed (to affected areas of skin).   Yes [provider]  Alpha-D-Galactosidase (BEANO) TABS Take 1 tablet by mouth 3 (three) times daily before meals.   Yes [provider]  amiodarone (PACERONE) 200 MG tablet Take 1 tablet (200 mg total) by mouth daily. 06/04/18  Yes Sherran Needs, NP  apixaban (ELIQUIS) 5 MG TABS tablet Take  1 tablet (5 mg total) by mouth 2 (two) times daily. 08/08/17  Yes Josue Hector, MD  Apoaequorin (PREVAGEN) 10 MG CAPS Take 10 mg by mouth daily.    Yes [provider]  Cholecalciferol (VITAMIN D3) 2000 UNITS TABS Take 2,000 Units by mouth daily.    Yes [provider]  desonide (DESOWEN) 0.05 % ointment Apply 1 application topically 2 (two) times daily as needed (for irritation).    Yes [provider]  diltiazem (CARDIZEM  CD) 120 MG 24 hr capsule Take 1 capsule (120 mg total) by mouth daily. 05/20/18 08/18/18 Yes Allred, Jeneen Rinks, MD  diltiazem (CARDIZEM) 30 MG tablet TAKE 1 TABLET ONCE DAILY AS NEEDED FOR PALPATATION. Patient taking differently: Take 30 mg by mouth as needed for palpitations 04/11/18  Yes Sherran Needs, NP  Lactase (LACTAID PO) Take 1 tablet by mouth 3 (three) times daily before meals.   Yes [provider]  loratadine (CLARITIN) 10 MG tablet Take 1 tablet (10 mg total) by mouth daily. For allergies, congestion Patient taking differently: Take 10 mg by mouth daily.  12/03/17  Yes Rai, Ripudeep K, MD  LORazepam (ATIVAN) 0.5 MG tablet Take 0.25 mg by mouth at bedtime as needed for sleep.    Yes [provider]  Magnesium Oxide 400 (240 Mg) MG TABS Take 1 tablet (400 mg total) by mouth daily. 01/16/18  Yes Deboraha Sprang, MD  Polyvinyl Alcohol-Povidone (REFRESH OP) Place 1 drop into both eyes 2 (two) times daily as needed (for dry eyes).    Yes [provider]  sodium chloride (OCEAN) 0.65 % SOLN nasal spray Place 1 spray into both nostrils as needed for congestion.   Yes [provider]    Family History Family History  Problem Relation Age of Onset  . Cancer Mother   . Mitral valve prolapse Mother   . Diabetes Father   . Hypertension Father   . Coronary artery disease Sister   . Stroke Brother   . Coronary artery disease Sister     Social History Social History   Tobacco Use  . Smoking status: Former Smoker    Last attempt to quit: 08/13/1984    Years since quitting: 33.8  . Smokeless tobacco: Never Used  Substance Use Topics  . Alcohol use: Yes    Alcohol/week: 8.4 oz    Types: 14 Glasses of wine per week  . Drug use: No     Allergies   Gluten meal; Itraconazole; Lactose intolerance (gi); and Statins   Review of Systems Review of Systems  All other systems reviewed and are negative.    Physical Exam Updated Vital Signs BP (!) 142/96    Pulse (!) 116   Temp 98.3 F (36.8 C) (Oral)   Resp 15   SpO2 93%   Physical Exam  Constitutional: She is oriented to person, place, and time. She appears well-developed and well-nourished. No distress.  HENT:  Head: Normocephalic and atraumatic.  Eyes: EOM are normal.  Neck: Normal range of motion. Neck supple.  Cardiovascular: Normal heart sounds.  Tachycardia.  Irregularly irregular  Pulmonary/Chest: Effort normal and breath sounds normal. She exhibits no tenderness.  Abdominal: Soft. She exhibits no distension. There is no tenderness.  Musculoskeletal: Normal range of motion.  Left lateral chest tenderness without crepitus  Neurological: She is alert and oriented to person, place, and time.  Skin: Skin is warm and dry.  Psychiatric: She has a normal mood and affect. Judgment normal.  Nursing note and vitals reviewed.    ED Treatments / Results  Labs (all labs ordered are listed, but only abnormal results are displayed) Labs Reviewed  CBC WITH DIFFERENTIAL/PLATELET - Abnormal; Notable for the following components:      Result Value   WBC 15.2 (*)    Hemoglobin 16.0 (*)    HCT 50.1 (*)    Platelets 144 (*)    Neutro Abs 13.5 (*)    Lymphs Abs 0.6 (*)    All other components within normal limits  COMPREHENSIVE METABOLIC PANEL - Abnormal; Notable for the following components:   Potassium 5.2 (*)    Glucose, Bld 143 (*)    Creatinine, Ser 1.08 (*)    AST 52 (*)    Total Bilirubin 1.9 (*)    GFR calc non Af Amer 46 (*)    GFR calc Af Amer 53 (*)    All other components within normal limits  TROPONIN I  TROPONIN I  TROPONIN I    EKG None  Radiology Dg Ribs Unilateral W/chest Left  Result Date: 06/24/2018 CLINICAL DATA:  Fall with rib pain EXAM: LEFT RIBS AND CHEST - 3+ VIEW COMPARISON:  12/01/2017 FINDINGS: Single-view chest demonstrates left-sided pacing device. Small left pleural effusion with left basilar mild airspace disease. Moderate subcutaneous emphysema  in the left lateral chest wall soft tissues. Small left apical pneumothorax measuring 15 mm in pleuroparenchymal separation. No midline shift. Normal heart size. Aortic atherosclerosis. Left rib series demonstrates acute left fifth, and sixth rib fractures, acute displaced eighth ninth and tenth rib fractures. IMPRESSION: 1. Multiple acute left-sided rib fractures with small left apical pneumothorax, small amount of left pleural effusion and mild left basilar airspace disease which may reflect atelectasis or mild contusion. 2. Moderate soft tissue emphysema within the left chest wall soft tissues. Critical Value/emergent results were called by telephone at the time of interpretation on 06/24/2018 at 2:51 pm to Dr. Blanchie Serve , who verbally acknowledged these results. Electronically Signed   By: Donavan Foil M.D.   On: 06/24/2018 14:51    Procedures .Critical Care Performed by: [name], MD Authorized by: [name], MD     CRITICAL CARE Performed by: [name] Total critical care time: 33 minutes Critical care time was exclusive of separately billable procedures and treating other patients. Critical care was necessary to treat or prevent imminent or life-threatening deterioration. Critical care was time spent personally by me on the following activities: development of treatment plan with patient and/or surrogate as well as nursing, discussions with consultants, evaluation of patient's response to treatment, examination of patient, obtaining history from patient or surrogate, ordering and performing treatments and interventions, ordering and review of laboratory studies, ordering and review of radiographic studies, pulse oximetry and re-evaluation of patient's condition.   Medications Ordered in ED Medications  diltiazem (CARDIZEM) 1 mg/mL load via infusion 15 mg (15 mg Intravenous Bolus from Bag 06/24/18 1833)    And  diltiazem (CARDIZEM) 100 mg in dextrose 5% 126mL (1 mg/mL)  infusion (5 mg/hr Intravenous New Bag/Given 06/24/18 1832)  morphine 4 MG/ML injection 4 mg (4 mg Intravenous Given 06/24/18 1823)     Initial Impression / Assessment and Plan / ED Course  I have reviewed the triage vital signs and the nursing notes.  Pertinent labs & imaging results that were available during my care of the patient were reviewed by me and considered in my medical decision making (see chart for details).  Left-sided rib fractures with a traumatic pneumothorax.  No hypoxia.  A. fib with RVR treated with Cardizem bolus and drip.  Currently on a Cardizem drip.  Will attempt rate control here in the emergency department.  Patient will be admitted to the hospital for ongoing medical care in regards to her A. fib as well as care by the trauma team in regards to her traumatic pneumothorax and rib fractures.  Pain control now.  Trauma: Dr. Redmond Pulling [facility] hospitalist   Final Clinical Impressions(s) / ED Diagnoses   Final diagnoses:  Traumatic pneumothorax, initial encounter  Closed fracture of multiple ribs of left side, initial encounter  Atrial fibrillation with rapid ventricular response [facility])    ED Discharge Orders    None       [name], MD 06/24/18 [phone]

## 2018-06-24 NOTE — ED Notes (Signed)
Pain meds given for rib pain

## 2018-06-24 NOTE — ED Notes (Signed)
Dr. Redmond Pulling in to assess pt for admission

## 2018-06-24 NOTE — H&P (Signed)
[name] [name] MWU:[id number] DOB: Jul 27, 1932 DOA: 06/24/2018     PCP: Lajean Manes, MD   Outpatient Specialists: CARDS: Dr. Rayann Heman, Dr. Caryl Comes   Patient arrived to ER on 06/24/18 at 1539  Patient coming from:   home Lives alone,      Chief Complaint:  Chief Complaint  Patient presents with  . Fall  . Chest Pain    HPI: [name] [name] is a 84 y.o. female with medical history significant of paroxysmal A.fib on eliquis, CVA, depression, DVT, mitral valve regurgitation seen a trial no dysfunction status post pacemaker, HTN, gout    Presented with  Mechanical fall result in significant chest pain denies head injury denied LOC she was prunning bushes and in lost her footing Xfelt to the left side hitting her chest on a rock. presented to emergency department with pleuritic chest pain.  Was found to have small pneumothorax and multiple rib fractures also was found to be in atrial fibrillation with RVR heart rates up to 140s of note recentlyrunny water have been slight decrease by cardiology. Patient otherwise had been doing well prior to the fall.  Regarding pertinent Chronic problems: atrial fibrillation on anticoagulation on eliquis  initially treated Rythmol SR was found to be ataxic felt to be likely secondary to neurotoxicity from the rythmol initially was decreased but there was not the patient continued to have significant A. Fib"0.0 was discontinued and she was switched to the diltiazem and amiodarone and  for rhythm  Control.she was initiated on Lodine dose of 200 twice a day and 26 of July of dialysis to decrease to 200  A day   While in ER: Started on Cardizem drip for A. Fib with RVR  discussed of trauma surgery we'll see her in consult and help manage pneumothorax  Following Medications were ordered in ER: Medications  diltiazem (CARDIZEM) 1 mg/mL load via infusion 15 mg (15 mg Intravenous Bolus from Bag 06/24/18 1833)    And  diltiazem (CARDIZEM) 100 mg  in dextrose 5% 136mL (1 mg/mL) infusion (5 mg/hr Intravenous New Bag/Given 06/24/18 1832)  morphine 4 MG/ML injection 4 mg (4 mg Intravenous Given 06/24/18 1823)    Significant initial  Findings: Abnormal Labs Reviewed  CBC WITH DIFFERENTIAL/PLATELET - Abnormal; Notable for the following components:      Result Value   WBC 15.2 (*)    Hemoglobin 16.0 (*)    HCT 50.1 (*)    Platelets 144 (*)    Neutro Abs 13.5 (*)    Lymphs Abs 0.6 (*)    All other components within normal limits  COMPREHENSIVE METABOLIC PANEL - Abnormal; Notable for the following components:   Potassium 5.2 (*)    Glucose, Bld 143 (*)    Creatinine, Ser 1.08 (*)    AST 52 (*)    Total Bilirubin 1.9 (*)    GFR calc non Af Amer 46 (*)    GFR calc Af Amer 53 (*)    All other components within normal limits     Na 141 K 5.2  Cr   stable,   Lab Results  Component Value Date   CREATININE 1.08 (H) 06/24/2018   CREATININE 1.01 (H) 12/02/2017   CREATININE 1.08 (H) 12/01/2017      HG/HCT  Stable     Component Value Date/Time   HGB 16.0 (H) 06/24/2018 1650   HGB 13.6 05/01/2017 1617   HCT 50.1 (H) 06/24/2018 1650   HCT 40.8 05/01/2017 1617  Troponin (Point of Care Test) No results for input(s): TROPIPOC in the last 72 hours.     BNP (last 3 results) Recent Labs    12/01/17 2119  BNP 98.2      UA  not ordered     CXR - Multiple acute left-sided rib fractures with small left apical pneumothorax,    ECG:   Personally reviewed by me showing: HR : 64 Rhythm:  NSR    no evidence of ischemic changes QTC 550     ED Triage Vitals [06/24/18 1612]  Enc Vitals Group     BP (!) 169/111     Pulse Rate (!) 135     Resp 18     Temp 98.3 F (36.8 C)     Temp Source Oral     SpO2 96 %     Weight      Height      Head Circumference      Peak Flow      Pain Score      Pain Loc      Pain Edu?      Excl. in [place]?   FBPZ(02)@       Latest  Blood pressure (!) 169/111, pulse (!) 135,  temperature 98.3 F (36.8 C), temperature source Oral, resp. rate 18, SpO2 96 %.     ER Provider Called:     Dr. Redmond Pulling  They Recommend admit to medicine, ok to continue Eliquis Will see in ER  Hospitalist was called for admission for A. Fib with RVR in a setting of fall resulting in multiple rib fractures and pneumothorax   Review of Systems:    Pertinent positives include: fall, chest pain  Constitutional:  No weight loss, night sweats, Fevers, chills, fatigue, weight loss  HEENT:  No headaches, Difficulty swallowing,Tooth/dental problems,Sore throat,  No sneezing, itching, ear ache, nasal congestion, post nasal drip,  Cardio-vascular:  No , Orthopnea, PND, anasarca, dizziness, palpitations.no Bilateral lower extremity swelling  GI:  No heartburn, indigestion, abdominal pain, nausea, vomiting, diarrhea, change in bowel habits, loss of appetite, melena, blood in stool, hematemesis Resp:  no shortness of breath at rest. No dyspnea on exertion, No excess mucus, no productive cough, No non-productive cough, No coughing up of blood.No change in color of mucus.No wheezing. Skin:  no rash or lesions. No jaundice GU:  no dysuria, change in color of urine, no urgency or frequency. No straining to urinate.  No flank pain.  Musculoskeletal:  No joint pain or no joint swelling. No decreased range of motion. No back pain.  Psych:  No change in mood or affect. No depression or anxiety. No memory loss.  Neuro: no localizing neurological complaints, no tingling, no weakness, no double vision, no gait abnormality, no slurred speech, no confusion  All systems reviewed and apart from [place] all are negative  Past Medical History:   Past Medical History:  Diagnosis Date  . CVA    Lacunar and cerebellar infarct  . Depression   . DVT (deep venous thrombosis) ([place])    Details not available in the chart  . Edema   . Mitral valve regurgitation    a. echo 2009 mild to moderate MR  .  Paroxysmal atrial fibrillation (HCC)   . Sinoatrial node dysfunction (HCC)   . Unspecified essential hypertension       Past Surgical History:  Procedure Laterality Date  . PACEMAKER INSERTION  09-14-2005   MDT EnRhythm dual chamber pacemaker implanted by Dr  Lovena Le for SND  . PPM GENERATOR CHANGEOUT N/A 05/07/2017   Procedure: PPM Generator Changeout;  Surgeon: Deboraha Sprang, MD;  Location: [facility];  Service: Cardiovascular;  Laterality: N/A;    Social History:  Ambulatory  independently      reports that she quit smoking about 33 years ago. She has never used smokeless tobacco. She reports that she drinks about 8.4 oz of alcohol per week. She reports that she does not use drugs.     Family History:  Family History  Problem Relation Age of Onset  . Cancer Mother   . Mitral valve prolapse Mother   . Diabetes Father   . Hypertension Father   . Coronary artery disease Sister   . Stroke Brother   . Coronary artery disease Sister     Allergies: Allergies  Allergen Reactions  . Gluten Meal Diarrhea  . Itraconazole Diarrhea and Itching  . Lactose Intolerance (Gi) Diarrhea and Nausea Only    Pt unsure  . Statins     Severe Muscle ache,can't tolerate      Prior to Admission medications   Medication Sig Start Date End Date Taking? Authorizing Provider  acetaminophen (TYLENOL) 650 MG CR tablet Take 650 mg by mouth every 8 (eight) hours as needed for pain.    [provider]  allopurinol (ZYLOPRIM) 100 MG tablet Take 100 mg by mouth daily.    [provider]  amiodarone (PACERONE) 200 MG tablet Take 1 tablet (200 mg total) by mouth daily. 06/04/18   Sherran Needs, NP  apixaban (ELIQUIS) 5 MG TABS tablet Take 1 tablet (5 mg total) by mouth 2 (two) times daily. 08/08/17   Josue Hector, MD  Apoaequorin (PREVAGEN) 10 MG CAPS Take 10 mg by mouth daily.     [provider]  Cholecalciferol (VITAMIN D3) 2000 UNITS TABS Take 2,000 Units by  mouth daily.     [provider]  desonide (DESOWEN) 0.05 % ointment Apply 1 application topically 2 (two) times daily as needed (dry skin).    [provider]  diltiazem (CARDIZEM CD) 120 MG 24 hr capsule Take 1 capsule (120 mg total) by mouth daily. 05/20/18 08/18/18  Allred, Jeneen Rinks, MD  diltiazem (CARDIZEM) 30 MG tablet TAKE 1 TABLET ONCE DAILY AS NEEDED FOR PALPATATION. 04/11/18   Sherran Needs, NP  folic acid (FOLVITE) 1 MG tablet Take 1 mg by mouth once a week.     [provider]  loratadine (CLARITIN) 10 MG tablet Take 1 tablet (10 mg total) by mouth daily. For allergies, congestion 12/03/17   Rai, Ripudeep K, MD  LORazepam (ATIVAN) 0.5 MG tablet Take 0.5 mg by mouth at bedtime as needed for sleep.     [provider]  Magnesium Oxide 400 (240 Mg) MG TABS Take 1 tablet (400 mg total) by mouth daily. 01/16/18   Deboraha Sprang, MD  Polyvinyl Alcohol-Povidone (REFRESH OP) Apply 1 drop to eye 2 (two) times daily as needed (dry eyes).    [provider]  sodium chloride (OCEAN) 0.65 % SOLN nasal spray Place 1 spray into both nostrils as needed for congestion.    [provider]   Physical Exam: Blood pressure (!) 169/111, pulse (!) 135, temperature 98.3 F (36.8 C), temperature source Oral, resp. rate 18, SpO2 96 %. 1. General:  in No Acute distress   acutely ill -appearing 2. Psychological: Alert and  Oriented 3. Head/ENT:     Dry Mucous  Membranes                          Head Non traumatic, neck supple                          Poor Dentition                    Left side chest wall tenderness 4. SKIN:  decreased Skin turgor,  Skin clean Dry and intact no rash 5. Heart: Regular rate and rhythm no  Murmur, no Rub or gallop 6. Lungs:  no wheezes or crackles    7. Abdomen: Soft, non-tender, Non distended   obese  bowel sounds present 8. Lower extremities: no clubbing, cyanosis, or  edema 9. Neurologically Grossly intact, moving all 4  extremities equally   10. MSK: Normal range of motion   LABS:     Recent Labs  Lab 06/24/18 1650  WBC 15.2*  NEUTROABS 13.5*  HGB 16.0*  HCT 50.1*  MCV 98.6  PLT 103*   Basic Metabolic Panel: Recent Labs  Lab 06/24/18 1650  NA 141  K 5.2*  CL 106  CO2 22  GLUCOSE 143*  BUN 20  CREATININE 1.08*  CALCIUM 9.4      Recent Labs  Lab 06/24/18 1650  AST 52*  ALT 29  ALKPHOS 83  BILITOT 1.9*  PROT 7.0  ALBUMIN 4.3   No results for input(s): LIPASE, AMYLASE in the last 168 hours. No results for input(s): AMMONIA in the last 168 hours.    HbA1C: No results for input(s): HGBA1C in the last 72 hours. CBG: No results for input(s): GLUCAP in the last 168 hours.    Urine analysis:    Component Value Date/Time   COLORURINE YELLOW 06/29/2014 1421   APPEARANCEUR CLEAR 06/29/2014 1421   LABSPEC 1.011 06/29/2014 1421   PHURINE 5.0 06/29/2014 1421   GLUCOSEU NEGATIVE 06/29/2014 1421   HGBUR NEGATIVE 06/29/2014 1421   BILIRUBINUR negative 04/24/2017 1722   KETONESUR negative 04/24/2017 1722   KETONESUR NEGATIVE 06/29/2014 1421   PROTEINUR negative 04/24/2017 1722   PROTEINUR NEGATIVE 06/29/2014 1421   UROBILINOGEN 0.2 04/24/2017 1722   UROBILINOGEN 0.2 06/29/2014 1421   NITRITE Negative 04/24/2017 1722   NITRITE NEGATIVE 06/29/2014 1421   LEUKOCYTESUR Negative 04/24/2017 1722       Cultures:    Component Value Date/Time   SDES URINE, RANDOM 10/24/2017 1720   SPECREQUEST NONE 10/24/2017 1720   CULT 70,000 COLONIES/mL ESCHERICHIA COLI (A) 10/24/2017 1720   REPTSTATUS 10/26/2017 FINAL 10/24/2017 1720     Radiological Exams on Admission: Dg Ribs Unilateral W/chest Left  Result Date: 06/24/2018 CLINICAL DATA:  Fall with rib pain EXAM: LEFT RIBS AND CHEST - 3+ VIEW COMPARISON:  12/01/2017 FINDINGS: Single-view chest demonstrates left-sided pacing device. Small left pleural effusion with left basilar mild airspace disease. Moderate subcutaneous emphysema in  the left lateral chest wall soft tissues. Small left apical pneumothorax measuring 15 mm in pleuroparenchymal separation. No midline shift. Normal heart size. Aortic atherosclerosis. Left rib series demonstrates acute left fifth, and sixth rib fractures, acute displaced eighth ninth and tenth rib fractures. IMPRESSION: 1. Multiple acute left-sided rib fractures with small left apical pneumothorax, small amount of left pleural effusion and mild left basilar airspace disease which may reflect atelectasis or mild contusion. 2. Moderate soft tissue emphysema within the left chest wall soft tissues. Critical Value/emergent results were  called by telephone at the time of interpretation on 06/24/2018 at 2:51 pm to Dr. Blanchie Serve , who verbally acknowledged these results. Electronically Signed   By: Donavan Foil M.D.   On: 06/24/2018 14:51    Chart has been reviewed    Assessment/Plan  84 y.o. female with medical history significant of paroxysmal A.fib on eliquis, CVA, depression, DVT, mitral valve regurgitation seen a trial no dysfunction status post pacemaker, HTN gout  Admitted for A. Fib with RVR in a setting of fall resulting in multiple rib fractures and pneumothorax  Present on Admission: . Pneumothorax, closed, traumatic appreciate trauma surgery consult.  Continue incentive spirometry scheduled Tylenol repeat chest x-ray in a.m. . Essential hypertension restart home medications currently stable . Mixed hyperlipidemia restart home medications currently stable . Atrial fibrillation with RVR (HCC) transition to p.o. medications currently appears to be back to sinus rhythm from would benefit from letting cardiology know the patient has been admitted given that they have been following her extensively . Multiple rib fractures continue incentive spirometer at risk for developing pneumonia observe overnight in stepdown . Leukocytosis -in the setting of multiple rib fractures . Hyperkalemia  -mild, we  will rehydrate and repeat     Other plan as per orders.  DVT prophylaxis:  Eliquis     Code Status:   DNR/DNI  as per patient   I had personally discussed CODE STATUS with patient and family   Family Communication:   Family  at  Bedside  plan of care was discussed with nefew  Disposition Plan:      To home once workup is complete and patient is stable                  Would benefit from PT/OT eval prior to DC  Ordered                                    Consults called: Trauma Surgery    Admission status:   inpatient       Level of care         SDU      Denzel Etienne 06/24/2018, 7:50 PM    [facility]  Pager [phone]   after 2 AM please page floor coverage PA If 7AM-7PM, please contact the day team taking care of the patient  [email or web address]  Password [email or web address]

## 2018-06-24 NOTE — ED Triage Notes (Signed)
Pt sts fall today after bending over and feeling week; pt sts fell on left rib area and does take blood thinners; pt sts started taking amiodarone

## 2018-06-24 NOTE — ED Triage Notes (Signed)
Pt in from UC for eval for multiple rib fractures today after a fall, pt denies hitting head & LOC, pt states, "they said I have a punctured lung too." pt hx of a fib, pt in a fib upon arrival to the ED, A&O x4, pt takes Eliquis

## 2018-06-24 NOTE — Discharge Instructions (Signed)
GO TO ER °

## 2018-06-24 NOTE — ED Provider Notes (Signed)
Patient placed in Quick Look pathway, seen and evaluated   Chief Complaint: Sent from urgent care, fall, multiple rib fractures and small pneumothorax.  HPI:   Patient reports she lives in an independent living facility and today she fell onto a rock outside of her house landing on her left side, and has had severe pain over the left ribs it is extremely painful for her to partake of breath, bruising noted over this area.  She denies chest pain elsewhere, although does report it feels like her heart is beating fast.  She has a history of A. fib, took her amiodarone while she was over at urgent care, and typically takes 30 mg of diltiazem if she feels help rotations but has not done this yet.  She did take her normal dose of diltiazem this morning.  She does not think she hit her head, denies pain in her neck or back, complaining primarily of pain over the ribs that is severe in nature.  Chest x-ray done at urgent care shows multiple rib fractures with small apical pneumothorax and possible pleural effusion.  ROS: + Left rib pain, shortness of breath, rotations  Physical Exam:   Gen: No distress  Neuro: Awake and Alert  Skin: Warm    Focused Exam: Tachycardia with irregularly irregular rhythm.  Breath sounds diminished in the left upper lung fields, tenderness to palpation over the left lateral ribs with bruising noted, further exam deferred due to acuity of condition.  Patient needs immediate acute bed, she has multiple rib fractures, with pneumothorax noted, is also in A. fib with RVR.  Notified nursing and discussed patient with Dr. Venora Maples.  Initiation of care has begun. The patient has been counseled on the process, plan, and necessity for staying for the completion/evaluation, and the remainder of the medical screening examination   [name] [name] 06/24/18 1641    [name], MD 06/24/18 1807

## 2018-06-25 ENCOUNTER — Inpatient Hospital Stay (HOSPITAL_COMMUNITY): Payer: Medicare Other

## 2018-06-25 DIAGNOSIS — I4891 Unspecified atrial fibrillation: Secondary | ICD-10-CM

## 2018-06-25 LAB — CBC
HCT: 40.1 % (ref 36.0–46.0)
HEMOGLOBIN: 13.1 g/dL (ref 12.0–15.0)
MCH: 31.7 pg (ref 26.0–34.0)
MCHC: 32.7 g/dL (ref 30.0–36.0)
MCV: 97.1 fL (ref 78.0–100.0)
Platelets: 119 10*3/uL — ABNORMAL LOW (ref 150–400)
RBC: 4.13 MIL/uL (ref 3.87–5.11)
RDW: 14.7 % (ref 11.5–15.5)
WBC: 7.1 10*3/uL (ref 4.0–10.5)

## 2018-06-25 LAB — MAGNESIUM: MAGNESIUM: 1.9 mg/dL (ref 1.7–2.4)

## 2018-06-25 LAB — COMPREHENSIVE METABOLIC PANEL
ALK PHOS: 63 U/L (ref 38–126)
ALT: 22 U/L (ref 0–44)
ANION GAP: 10 (ref 5–15)
AST: 21 U/L (ref 15–41)
Albumin: 3.3 g/dL — ABNORMAL LOW (ref 3.5–5.0)
BUN: 19 mg/dL (ref 8–23)
CALCIUM: 8.3 mg/dL — AB (ref 8.9–10.3)
CO2: 22 mmol/L (ref 22–32)
Chloride: 110 mmol/L (ref 98–111)
Creatinine, Ser: 1.07 mg/dL — ABNORMAL HIGH (ref 0.44–1.00)
GFR calc Af Amer: 54 mL/min — ABNORMAL LOW (ref >60)
GFR calc non Af Amer: 47 mL/min — ABNORMAL LOW (ref >60)
Glucose, Bld: 111 mg/dL — ABNORMAL HIGH (ref 70–99)
Potassium: 3.6 mmol/L (ref 3.5–5.1)
SODIUM: 142 mmol/L (ref 135–145)
TOTAL PROTEIN: 5.6 g/dL — AB (ref 6.5–8.1)
Total Bilirubin: 1.1 mg/dL (ref 0.3–1.2)

## 2018-06-25 LAB — PHOSPHORUS: PHOSPHORUS: 3.6 mg/dL (ref 2.5–4.6)

## 2018-06-25 LAB — TSH: TSH: 7.947 u[IU]/mL — ABNORMAL HIGH (ref 0.350–4.500)

## 2018-06-25 LAB — TROPONIN I
Troponin I: <0.03 ng/mL (ref <0.03)
Troponin I: <0.03 ng/mL (ref <0.03)

## 2018-06-25 MED ORDER — MAGNESIUM OXIDE 400 (241.3 MG) MG PO TABS
400.0000 mg | ORAL_TABLET | Freq: Every day | ORAL | Status: DC
  Administered 2018-06-25 – 2018-06-26 (×2): 400 mg via ORAL
  Filled 2018-06-25 (×2): qty 1

## 2018-06-25 MED ORDER — SODIUM CHLORIDE 0.9 % IV SOLN
INTRAVENOUS | Status: DC
  Administered 2018-06-25: 11:00:00 via INTRAVENOUS

## 2018-06-25 MED ORDER — METHOCARBAMOL 500 MG PO TABS: 500.0000 mg | ORAL_TABLET | Freq: Three times a day (TID) | ORAL | Status: DC | PRN

## 2018-06-25 MED ORDER — VITAMIN D 1000 UNITS PO TABS
2000.0000 [IU] | ORAL_TABLET | Freq: Every day | ORAL | Status: DC
  Administered 2018-06-25 – 2018-06-26 (×2): 2000 [IU] via ORAL
  Filled 2018-06-25 (×4): qty 2

## 2018-06-25 MED ORDER — APOAEQUORIN 10 MG PO CAPS: 10.0000 mg | ORAL_CAPSULE | Freq: Every day | ORAL | Status: DC

## 2018-06-25 MED ORDER — IBUPROFEN 200 MG PO TABS
400.0000 mg | ORAL_TABLET | Freq: Four times a day (QID) | ORAL | Status: DC | PRN
Start: 2018-06-25 — End: 2018-06-26

## 2018-06-25 MED ORDER — SALINE SPRAY 0.65 % NA SOLN
1.0000 | NASAL | Status: DC | PRN
  Filled 2018-06-25: qty 44

## 2018-06-25 NOTE — Discharge Instructions (Signed)

## 2018-06-25 NOTE — Care Management Note (Addendum)
Case Management Note  Patient Details  Name: [name] [name] MRN: [id number] Date of Birth: [birth year]  Subjective/Objective:    From indep living, presents with mechanical fall, has small ptx, rib fx's, afib with rvr.  Poss dc on 7/31. Await pt/ot eval.                 Action/Plan: NCM will follow for transition of care needs.   Expected Discharge Date:                  Expected Discharge Plan:  [place]  In-House Referral:     Discharge planning Services  CM Consult  Post Acute Care Choice:    Choice offered to:     DME Arranged:    DME Agency:     HH Arranged:    [place] Agency:     Status of Service:  In process, will continue to follow  If discussed at Long Length of Stay Meetings, dates discussed:    Additional Comments:  [name], RN 06/25/2018, 10:35 AM

## 2018-06-25 NOTE — Progress Notes (Signed)
PHARMACIST - PHYSICIAN ORDER COMMUNICATION  CONCERNING: P&T Medication Policy on Herbal Medications  DESCRIPTION:  This patient's order for:  APOAEQUORIN  has been noted.  This product(s) is classified as an "herbal" or natural product. Due to a lack of definitive safety studies or FDA approval, nonstandard manufacturing practices, plus the potential risk of unknown drug-drug interactions while on inpatient medications, the Pharmacy and Therapeutics Committee does not permit the use of "herbal" or natural products of this type within [facility].   ACTION TAKEN: The pharmacy department is unable to verify this order at this time and your patient has been informed of this safety policy. Please reevaluate patient's clinical condition at discharge and address if the herbal or natural product(s) should be resumed at that time.

## 2018-06-25 NOTE — Progress Notes (Signed)
Patient ID: [name] [name], female   DOB: [birth year], 84 y.o.   MRN: [id number]       Subjective: Patient feels well this morning.  No pain just laying in bed.  Some pain when she moves around.  Denies SOB, on RA.  Objective: Vital signs in last 24 hours: Temp:  [98 F (36.7 C)-98.3 F (36.8 C)] 98.1 F (36.7 C) (07/29 2300) Pulse Rate:  [61-135] 79 (07/29 2300) Resp:  [14-27] 17 (07/29 2300) BP: (125-169)/(60-111) 125/60 (07/29 2300) SpO2:  [90 %-97 %] 90 % (07/29 2300) Weight:  [70.8 kg (156 lb)-71.8 kg (158 lb 4.6 oz)] 71.8 kg (158 lb 4.6 oz) (07/29 2130) Last BM Date: 06/24/18  Intake/Output from previous day: 07/29 0701 - 07/30 0700 In: 360.2 [I.V.:360.2] Out: -  Intake/Output this shift: No intake/output data recorded.  PE: Gen: NAD Heart: regular, 60s.  Has pacemakers Lungs: CTAB with good air movement.  sats in mid to upper 90s Abd: soft, NT, ND, +BS Ext: NVI  Lab Results:  Recent Labs    06/24/18 1650  WBC 15.2*  HGB 16.0*  HCT 50.1*  PLT 144*   BMET Recent Labs    06/24/18 1650  NA 141  K 5.2*  CL 106  CO2 22  GLUCOSE 143*  BUN 20  CREATININE 1.08*  CALCIUM 9.4   PT/INR No results for input(s): LABPROT, INR in the last 72 hours. CMP     Component Value Date/Time   NA 141 06/24/2018 1650   NA 145 (H) 05/01/2017 1617   K 5.2 (H) 06/24/2018 1650   CL 106 06/24/2018 1650   CO2 22 06/24/2018 1650   GLUCOSE 143 (H) 06/24/2018 1650   BUN 20 06/24/2018 1650   BUN 19 05/01/2017 1617   CREATININE 1.08 (H) 06/24/2018 1650   CREATININE 0.93 (H) 09/14/2016 1247   CALCIUM 9.4 06/24/2018 1650   PROT 7.0 06/24/2018 1650   ALBUMIN 4.3 06/24/2018 1650   AST 52 (H) 06/24/2018 1650   ALT 29 06/24/2018 1650   ALKPHOS 83 06/24/2018 1650   BILITOT 1.9 (H) 06/24/2018 1650   GFRNONAA 46 (L) 06/24/2018 1650   GFRAA 53 (L) 06/24/2018 1650   Lipase  No results found for: LIPASE     Studies/Results: Dg Ribs Unilateral W/chest Left  Result  Date: 06/24/2018 CLINICAL DATA:  Fall with rib pain EXAM: LEFT RIBS AND CHEST - 3+ VIEW COMPARISON:  12/01/2017 FINDINGS: Single-view chest demonstrates left-sided pacing device. Small left pleural effusion with left basilar mild airspace disease. Moderate subcutaneous emphysema in the left lateral chest wall soft tissues. Small left apical pneumothorax measuring 15 mm in pleuroparenchymal separation. No midline shift. Normal heart size. Aortic atherosclerosis. Left rib series demonstrates acute left fifth, and sixth rib fractures, acute displaced eighth ninth and tenth rib fractures. IMPRESSION: 1. Multiple acute left-sided rib fractures with small left apical pneumothorax, small amount of left pleural effusion and mild left basilar airspace disease which may reflect atelectasis or mild contusion. 2. Moderate soft tissue emphysema within the left chest wall soft tissues. Critical Value/emergent results were called by telephone at the time of interpretation on 06/24/2018 at 2:51 pm to Dr. Blanchie Serve , who verbally acknowledged these results. Electronically Signed   By: Donavan Foil M.D.   On: 06/24/2018 14:51    Anti-infectives: Anti-infectives (From admission, onward)   None       Assessment/Plan Ground level fall Left rib fractures 5, 6, 8, 9, 10 - pain control, IS, PT/OT  evals today Small left pneumothorax - CXR pending today, but appears to show resolution of PTX Chronic paroxysmal atrial fibrillation now with RVR - per medicine, currently in NSR History of stroke ?dehydration  Hyperkalemia - labs pending today Hyperbilirubinemia - unknown etiology FEN - regular diet VTE - Eliquis ID - none   LOS: 1 day    Henreitta Cea , [facility] 06/25/2018, 7:50 AM Pager: [phone]

## 2018-06-25 NOTE — Progress Notes (Signed)
[name] [name] TEAM 1 - Stepdown/ICU TEAM  [name] [name]  WNI:[id number] DOB: 1932-02-25 DOA: 06/24/2018 PCP: Lajean Manes, MD    Brief Narrative:  84yo F w/ a hx of paroxysmal A.fib on eliquis, CVA, depression, DVT, mitral valve regurg, status post pacemaker, HTN, and gout who presented after a mechanical fall resulting in significant chest pain. She was prunning bushes, lost her footing, and fell to her L side hitting her chest on a rock.   In the ED she was found to have a small pneumothorax and multiple rib fractures.  She was also noted to be in atrial fibrillation with RVR at 140.  Significant Events: 7/29 admit after mechanical fall  Subjective: Resting comfortably in bed.  States she feels much better today.  Denies n/v, abdom pain, sob, or HA.  Some expected L chest wall pain persists.   Assessment & Plan:  Pneumothorax, traumatic Trauma Surgery directing care - PTX appears stable on f/u CXR today   Multiple rib fractures continue incentive spirometer   Parox Atrial fibrillation with RVR Has converted back to NSR - cont home medical regimen and follow on tele   Hypertension BP reasonably well controlled for the situation - follow  Hyperlipidemia Cont home medical tx   Mitral valve regurgitation   SSS s/p pacer 2006  Hyperkalemia Resolved  DVT prophylaxis: Eliquis Code Status: DNR / NO CODE Family Communication: no family present at time of exam  Disposition Plan: PT/OT - follow tele - possible d/c 7/31  Consultants:  Trauma Surgery   Antimicrobials:  none   Objective: Blood pressure (!) 147/64, pulse 79, temperature 97.6 F (36.4 C), temperature source Oral, resp. rate 18, height 5\' 7"  (1.702 m), weight 71.8 kg (158 lb 4.6 oz), SpO2 94 %.  Intake/Output Summary (Last 24 hours) at 06/25/2018 0914 Last data filed at 06/25/2018 0730 Gross per 24 hour  Intake 696.55 ml  Output -  Net 696.55 ml   Filed Weights   06/24/18 2130  Weight: 71.8 kg  (158 lb 4.6 oz)    Examination: General: No acute respiratory distress Lungs: Clear to auscultation bilaterally without wheezes or crackles Cardiovascular: Regular rate and rhythm without murmur gallop or rub normal S1 and S2 Abdomen: Nontender, nondistended, soft, bowel sounds positive, no rebound, no ascites, no appreciable mass Extremities: No significant cyanosis, clubbing, or edema bilateral lower extremities  CBC: Recent Labs  Lab 06/24/18 1650 06/25/18 0716  WBC 15.2* 7.1  NEUTROABS 13.5*  --   HGB 16.0* 13.1  HCT 50.1* 40.1  MCV 98.6 97.1  PLT 144* 381*   Basic Metabolic Panel: Recent Labs  Lab 06/24/18 1650 06/25/18 0716  NA 141 142  K 5.2* 3.6  CL 106 110  CO2 22 22  GLUCOSE 143* 111*  BUN 20 19  CREATININE 1.08* 1.07*  CALCIUM 9.4 8.3*  MG  --  1.9  PHOS  --  3.6   GFR: Estimated Creatinine Clearance: 38.7 mL/min (A) (by C-G formula based on SCr of 1.07 mg/dL (H)).  Liver Function Tests: Recent Labs  Lab 06/24/18 1650 06/25/18 0716  AST 52* 21  ALT 29 22  ALKPHOS 83 63  BILITOT 1.9* 1.1  PROT 7.0 5.6*  ALBUMIN 4.3 3.3*   Cardiac Enzymes: Recent Labs  Lab 06/24/18 1955 06/25/18 0014 06/25/18 0716  TROPONINI <0.03 <0.03 <0.03    HbA1C: Hgb A1c MFr Bld  Date/Time Value Ref Range Status  12/02/2017 03:22 AM 5.6 4.8 - 5.6 % Final  Comment:    (NOTE) Pre diabetes:          5.7%-6.4% Diabetes:              >6.4% Glycemic control for   <7.0% adults with diabetes   02/15/2010 12:00 AM 5.7 4.6 - 6.5 % Final    Comment:    See lab report for associated comment(s)    Scheduled Meds: . acetaminophen  650 mg Oral Q6H  . allopurinol  100 mg Oral Daily  . amiodarone  200 mg Oral Daily  . apixaban  5 mg Oral BID  . diltiazem  120 mg Oral Daily  . guaiFENesin  600 mg Oral BID  . loratadine  10 mg Oral Daily  . senna  1 tablet Oral BID     LOS: 1 day   Cherene Altes, MD [facility] Office  [phone] Pager - Text  Page per Amion  If 7PM-7AM, please contact night-coverage per Amion 06/25/2018, 9:14 AM

## 2018-06-25 NOTE — Evaluation (Signed)
Physical Therapy Evaluation Patient Details Name: [name] [name] MRN: [id number] DOB: [birth year] Today's Date: 06/25/2018   History of Present Illness  84yo F w/ a hx of paroxysmal A.fib on eliquis, CVA, depression, DVT, mitral valve regurg, status post pacemaker, HTN, and gout who presented after a mechanical fall resulting in significant chest pain. In the ED she was found to have a small pneumothorax and multiple rib fractures.   Clinical Impression  PTA pt living in [place] living home, and independent in mobility without AD and iADLs. Pt experienced mechanical fall, possibly due to side effects of medication. Pt currently limited in safe mobility by increased rib pain with movement and coughing, decreased safety awareness and decreased balance. Pt currently Mod I for bed mobility, and supervision for transfers and ambulation of limited community distance without AD. Pt noticed to have decreased balance in Rhomberg and tandem stance. Pt educated on need to improve balance for improved safety especially with history of falling. PT recommending Outpatient PT at [facility] to work on improving balance and safety awareness. PT also recommending 24 assist for safety and assist with iADLs until pain subsides and mobility improves. Pt states she is inquiring about a home health aide from a friend who has used one in the past. PT will continue to follow acutely.     Follow Up Recommendations Supervision/Assistance - 24 hour;Outpatient PT    Equipment Recommendations  None recommended by PT    Recommendations for Other Services       Precautions / Restrictions Precautions Precautions: Fall Restrictions Weight Bearing Restrictions: No      Mobility  Bed Mobility Overal bed mobility: Modified Independent             General bed mobility comments: HoB elevated, requires increased effort  Transfers Overall transfer level: Needs assistance Equipment used:  None Transfers: Sit to/from Stand Sit to Stand: Supervision         General transfer comment: supervision for safety  Ambulation/Gait Ambulation/Gait assistance: Supervision Gait Distance (Feet): 300 Feet Assistive device: None Gait Pattern/deviations: Step-through pattern;WFL(Within Functional Limits) Gait velocity: WFL Gait velocity interpretation: >2.62 ft/sec, indicative of community ambulatory General Gait Details: supervision for safety, stopped short due to pain with coughing      Balance Overall balance assessment: Needs assistance Sitting-balance support: No upper extremity supported;Feet unsupported Sitting balance-Leahy Scale: Good     Standing balance support: No upper extremity supported;During functional activity Standing balance-Leahy Scale: Good       Tandem Stance - Right Leg: 10 Tandem Stance - Left Leg: 10 Rhomberg - Eyes Opened: 30 Rhomberg - Eyes Closed: 20 High level balance activites: Head turns;Sudden stops High Level Balance Comments: no change in gait speed with head turns, no LoB with sudden stop             Pertinent Vitals/Pain Pain Assessment: 0-10 Pain Score: 4  Pain Location: ribs Pain Descriptors / Indicators: Sharp;Sore Pain Intervention(s): Limited activity within patient's tolerance;Monitored during session;Repositioned    Home Living Family/patient expects to be discharged to:: Private residence Living Arrangements: Alone Available Help at Discharge: Neighbor;Available PRN/intermittently Type of Home: Independent living facility Home Access: Level entry     Home Layout: One level Home Equipment: Cane - single point      Prior Function Level of Independence: Independent                  Extremity/Trunk Assessment   Upper Extremity Assessment Upper Extremity Assessment: Overall [facility]  for tasks assessed    Lower Extremity Assessment Lower Extremity Assessment: Overall WFL for tasks assessed        Communication   Communication: No difficulties  Cognition Arousal/Alertness: Awake/alert Behavior During Therapy: WFL for tasks assessed/performed Overall Cognitive Status: Within Functional Limits for tasks assessed                                 General Comments: very talkative, requires redirect, and has poor safety awareness       General Comments General comments (skin integrity, edema, etc.): VSS on RA     Assessment/Plan    PT Assessment Patient needs continued PT services  PT Problem List Decreased balance;Decreased safety awareness;Pain       PT Treatment Interventions Gait training;Functional mobility training;Therapeutic activities;Therapeutic exercise;Balance training;Cognitive remediation;Patient/family education    PT Goals (Current goals can be found in the Care Plan section)  Acute Rehab PT Goals Patient Stated Goal: get home to dog PT Goal Formulation: With patient Time For Goal Achievement: 07/09/18 Potential to Achieve Goals: Good    Frequency Min 3X/week   Barriers to discharge Decreased caregiver support         AM-PAC PT "6 Clicks" Daily Activity  Outcome Measure Difficulty turning over in bed (including adjusting bedclothes, sheets and blankets)?: A Little Difficulty moving from lying on back to sitting on the side of the bed? : A Little Difficulty sitting down on and standing up from a chair with arms (e.g., wheelchair, bedside commode, etc,.)?: A Little Help needed moving to and from a bed to chair (including a wheelchair)?: None Help needed walking in hospital room?: None Help needed climbing 3-5 steps with a railing? : A Little 6 Click Score: 20    End of Session Equipment Utilized During Treatment: Gait belt Activity Tolerance: Patient tolerated treatment well Patient left: in bed;with call bell/phone within reach;with family/visitor present Nurse Communication: Mobility status PT Visit Diagnosis: Unsteadiness on feet  (R26.81);History of falling (Z91.81);Pain;Difficulty in walking, not elsewhere classified (R26.2) Pain - Right/Left: Left Pain - part of body: (ribs)    Time: 0814-4818 PT Time Calculation (min) (ACUTE ONLY): 38 min   Charges:   PT Evaluation $PT Eval Moderate Complexity: 1 Mod PT Treatments $Gait Training: 8-22 mins        Tyshae Stair B. Migdalia Dk PT, DPT Acute Rehabilitation  [phone] Pager [phone]    [place] 06/25/2018, 12:33 PM

## 2018-06-25 NOTE — Consult Note (Addendum)
Cardiology Consultation:   Patient ID: [name] [name]; [id number]; 10-12-1932   Admit date: 06/24/2018 Date of Consult: 06/25/2018  Primary Care Provider: Lajean Manes, MD Primary Cardiologist: Virl Axe, MD    Patient Profile:   [name] [name] is a 84 y.o. female with a hx of CVA, DVT, and paroxysmal AFib on Eliquis, sinus node dysfunction w/PPM, HTN, who is being seen today for the evaluation of rapid Afib at the request of Dr. Thereasa Solo.  History of Present Illness:   [name] [name] was admitted after a fall w/trauma, described as mechanical, after a slipp/fall while doing some gardening.  She has multiple rib fractures and small L apical PTX.  She was noted in rapid AFib and started on dilt gtt for rate control.  The patient had subsequently converted back to SR/AP rhythm, and is on her home dose amiodarone and dilt, her Eliquis has not been interrupted.  She tells me she was out pruning a tree, felt very well, was finishing up when she noticed that her rose bush needed trimming, she explains that this was in a pot onm a bit of an incline area of her yard, when she bent forward felt a little off balance "funny", and said she knew she was going to fall, did not black out or faint.  There was a large landscaping rock by her and she tried to twist her body to avoid hitting it, but was ended up landing on it, and had immediate pain.    Otherwise, she mentions since starting the amiodarone she has been very pleasantly surprised at how well she has been feeling.  No palpitations, she has not felt like she had had any AF, no CP or SOB.    Yesterday even when they told her she was in AFib she did not think she was, though admits she was in pretty significant pain and very distracted by it.  She feels much better today, pain is well controlled, no SOB  AFib hx: Dx 2012 Failed propafenone (2/2 gait disturbance) Amiodarone 200mg  BID (to reduce to daily dosing on 7/26), and  diltiazem 120mg  daily started 05/20/18, patient wanted to avoid ablation if possible with anesthesia worries at her age  Device interrogation: MDT dual chamber PPM, implanted 2006, gen change 2018  Past Medical History:  Diagnosis Date  . CVA    Lacunar and cerebellar infarct  . Depression   . DVT (deep venous thrombosis) ([place])    Details not available in the chart  . Edema   . Mitral valve regurgitation    a. echo 2009 mild to moderate MR  . Paroxysmal atrial fibrillation (HCC)   . Sinoatrial node dysfunction (HCC)   . Unspecified essential hypertension     Past Surgical History:  Procedure Laterality Date  . PACEMAKER INSERTION  09-14-2005   MDT EnRhythm dual chamber pacemaker implanted by Dr Lovena Le for SND  . PPM GENERATOR CHANGEOUT N/A 05/07/2017   Procedure: PPM Generator Changeout;  Surgeon: Deboraha Sprang, MD;  Location: [facility];  Service: Cardiovascular;  Laterality: N/A;       Inpatient Medications: Scheduled Meds: . acetaminophen  650 mg Oral Q6H  . allopurinol  100 mg Oral Daily  . amiodarone  200 mg Oral Daily  . apixaban  5 mg Oral BID  . cholecalciferol  2,000 Units Oral Daily  . diltiazem  120 mg Oral Daily  . guaiFENesin  600 mg Oral BID  . loratadine  10 mg Oral Daily  .  magnesium oxide  400 mg Oral Daily  . senna  1 tablet Oral BID   Continuous Infusions:  PRN Meds: ibuprofen, LORazepam, methocarbamol, morphine injection, ondansetron **OR** ondansetron (ZOFRAN) IV, senna-docusate, sodium chloride, traMADol  Allergies:    Allergies  Allergen Reactions  . Gluten Meal Diarrhea  . Itraconazole Diarrhea and Itching  . Lactose Intolerance (Gi) Diarrhea and Nausea Only  . Statins Other (See Comments)    Severe muscle aches;,can't tolerate     Social History:   Social History   Socioeconomic History  . Marital status: Widowed    Spouse name: Not on file  . Number of children: Not on file  . Years of education: Not on file  .  Highest education level: Not on file  Occupational History  . Not on file  Social Needs  . Financial resource strain: Not on file  . Food insecurity:    Worry: Not on file    Inability: Not on file  . Transportation needs:    Medical: Not on file    Non-medical: Not on file  Tobacco Use  . Smoking status: Former Smoker    Last attempt to quit: 08/13/1984    Years since quitting: 33.8  . Smokeless tobacco: Never Used  Substance and Sexual Activity  . Alcohol use: Yes    Alcohol/week: 8.4 oz    Types: 14 Glasses of wine per week  . Drug use: No  . Sexual activity: Not Currently  Lifestyle  . Physical activity:    Days per week: Not on file    Minutes per session: Not on file  . Stress: Not on file  Relationships  . Social connections:    Talks on phone: Not on file    Gets together: Not on file    Attends religious service: Not on file    Active member of club or organization: Not on file    Attends meetings of clubs or organizations: Not on file    Relationship status: Not on file  . Intimate partner violence:    Fear of current or ex partner: Not on file    Emotionally abused: Not on file    Physically abused: Not on file    Forced sexual activity: Not on file  Other Topics Concern  . Not on file  Social History Narrative  . Not on file    Family History:   Family History  Problem Relation Age of Onset  . Cancer Mother   . Mitral valve prolapse Mother   . Diabetes Father   . Hypertension Father   . Coronary artery disease Sister   . Stroke Brother   . Coronary artery disease Sister      ROS:  Please see the history of present illness.  All other ROS reviewed and negative.     Physical Exam/Data:   Vitals:   06/24/18 2030 06/24/18 2130 06/24/18 2300 06/25/18 0804  BP: 133/73 (!) 143/73 125/60 (!) 147/64  Pulse: 70 62 79   Resp: 17 15 17 18   Temp:  98 F (36.7 C) 98.1 F (36.7 C) 97.6 F (36.4 C)  TempSrc:  Oral Oral Oral  SpO2: 94% 93% 90% 94%    Weight:  158 lb 4.6 oz (71.8 kg)    Height:  5\' 7"  (1.702 m)      Intake/Output Summary (Last 24 hours) at 06/25/2018 1238 Last data filed at 06/25/2018 1118 Gross per 24 hour  Intake 1154.41 ml  Output -  Net  1154.41 ml   Filed Weights   06/24/18 2130  Weight: 158 lb 4.6 oz (71.8 kg)   Body mass index is 24.79 kg/m.  General:  Well nourished, well developed, in no acute distress HEENT: normal Lymph: no adenopathy Neck: no JVD Endocrine:  No thryomegaly Vascular: No carotid bruitss  Cardiac:  RRR; no significant murmurs, no gallops or rubs Lungs:  CTA b/l, no wheezing, rhonchi or rales  Abd: soft, nontender Ext: no edema Musculoskeletal:  No deformities, age appropriate atrophy Skin: warm and dry  Neuro:  No gross focal abnormalities noted Psych:  Normal affect   EKG:  The EKG was personally reviewed and demonstrates:   06/24/18 is AP, RBBB, 64bpm Telemetry:  Telemetry was personally reviewed and demonstrates:  A paced, V sensed rhythm, I do not appreciate any AF  Relevant CV Studies:  12/02/17: TTE Study Conclusions - Left ventricle: The cavity size was normal. Systolic function was   normal. The estimated ejection fraction was in the range of 55%   to 60%. Wall motion was normal; there were no regional wall   motion abnormalities. - Aortic valve: There was mild regurgitation. - Mitral valve: Mildly calcified annulus. - Left atrium: The atrium was mildly dilated. (61mm) - Pulmonary arteries: Systolic pressure was moderately increased.   PA peak pressure: 48 mm Hg (S).  Laboratory Data:  Chemistry Recent Labs  Lab 06/24/18 1650 06/25/18 0716  NA 141 142  K 5.2* 3.6  CL 106 110  CO2 22 22  GLUCOSE 143* 111*  BUN 20 19  CREATININE 1.08* 1.07*  CALCIUM 9.4 8.3*  GFRNONAA 46* 47*  GFRAA 53* 54*  ANIONGAP 13 10    Recent Labs  Lab 06/24/18 1650 06/25/18 0716  PROT 7.0 5.6*  ALBUMIN 4.3 3.3*  AST 52* 21  ALT 29 22  ALKPHOS 83 63  BILITOT 1.9* 1.1    Hematology Recent Labs  Lab 06/24/18 1650 06/25/18 0716  WBC 15.2* 7.1  RBC 5.08 4.13  HGB 16.0* 13.1  HCT 50.1* 40.1  MCV 98.6 97.1  MCH 31.5 31.7  MCHC 31.9 32.7  RDW 14.5 14.7  PLT 144* 119*   Cardiac Enzymes Recent Labs  Lab 06/24/18 1955 06/25/18 0014 06/25/18 0716  TROPONINI <0.03 <0.03 <0.03   No results for input(s): TROPIPOC in the last 168 hours.  BNPNo results for input(s): BNP, PROBNP in the last 168 hours.  DDimer No results for input(s): DDIMER in the last 168 hours.  Radiology/Studies:   Dg Ribs Unilateral W/chest Left Result Date: 06/24/2018 CLINICAL DATA:  Fall with rib pain EXAM: LEFT RIBS AND CHEST - 3+ VIEW COMPARISON:  12/01/2017 FINDINGS: Single-view chest demonstrates left-sided pacing device. Small left pleural effusion with left basilar mild airspace disease. Moderate subcutaneous emphysema in the left lateral chest wall soft tissues. Small left apical pneumothorax measuring 15 mm in pleuroparenchymal separation. No midline shift. Normal heart size. Aortic atherosclerosis. Left rib series demonstrates acute left fifth, and sixth rib fractures, acute displaced eighth ninth and tenth rib fractures. IMPRESSION: 1. Multiple acute left-sided rib fractures with small left apical pneumothorax, small amount of left pleural effusion and mild left basilar airspace disease which may reflect atelectasis or mild contusion. 2. Moderate soft tissue emphysema within the left chest wall soft tissues. Critical Value/emergent results were called by telephone at the time of interpretation on 06/24/2018 at 2:51 pm to Dr. Blanchie Serve , who verbally acknowledged these results. Electronically Signed   By: Madie Reno.D.  On: 06/24/2018 14:51    Dg Chest Port 1 View Result Date: 06/25/2018 CLINICAL DATA:  History of rib fractures and pneumothorax after fall. EXAM: PORTABLE CHEST 1 VIEW COMPARISON:  06/24/2018. FINDINGS: Mediastinum hilar structures are stable. Cardiac pacer  stable position. Heart size stable. Mild left base subsegmental atelectasis. Tiny left pleural effusion. Small left apical pneumothorax again noted. Interim improvement from prior exam. Left rib fractures again noted. Left chest wall subcutaneous emphysema. IMPRESSION: 1. Left rib fractures with small left apical pneumothorax again noted. Interim improvement of left apical pneumothorax from prior exam. Left chest wall subcutaneous emphysema again noted. 2. Mild left base subsegmental atelectasis and small left pleural effusion again noted. Electronically Signed   By: Marcello Moores  Register   On: 06/25/2018 08:02    Assessment and Plan:   1. Paroxysmal AFib     CHA2DS2Vasc is 6, on Eliquis     In review of notes, sounds like she may have had some AF while in triage, EKG and tele noted only for A paced rhythm.      No changes, agree with continuation of home meds unchanged  2. Mechanical fall w/trauma     She mentioned bending over quickly and feeling a little funny/off balance.       She denies feeling like she was going to faint, but knew she was going to fall.     Remote device check last month was normal     No syncope        For questions or updates, please contact [place] Please consult [email or web address] for contact info under Cardiology/STEMI.   Signed, Baldwin Jamaica, PA-C  06/25/2018 12:38 PM   I have seen, examined the patient, and reviewed the above assessment and plan.  Changes to above are made where necessary.  On exam, RRR.  AFib is currently controlled.  No further inpatient CV recommendations.  OK to discharge from our standpoint. Electrophysiology team to see as needed while here. Please call with questions.   Co Sign: Thompson Grayer, MD 06/25/2018 7:56 PM

## 2018-06-26 DIAGNOSIS — R296 Repeated falls: Secondary | ICD-10-CM

## 2018-06-26 DIAGNOSIS — E875 Hyperkalemia: Secondary | ICD-10-CM

## 2018-06-26 DIAGNOSIS — S270XXA Traumatic pneumothorax, initial encounter: Principal | ICD-10-CM

## 2018-06-26 DIAGNOSIS — I10 Essential (primary) hypertension: Secondary | ICD-10-CM

## 2018-06-26 DIAGNOSIS — S2242XA Multiple fractures of ribs, left side, initial encounter for closed fracture: Secondary | ICD-10-CM

## 2018-06-26 LAB — BASIC METABOLIC PANEL
Anion gap: 15 (ref 5–15)
BUN: 11 mg/dL (ref 8–23)
CALCIUM: 9.1 mg/dL (ref 8.9–10.3)
CHLORIDE: 102 mmol/L (ref 98–111)
CO2: 24 mmol/L (ref 22–32)
CREATININE: 1 mg/dL (ref 0.44–1.00)
GFR calc Af Amer: 59 mL/min — ABNORMAL LOW (ref >60)
GFR calc non Af Amer: 51 mL/min — ABNORMAL LOW (ref >60)
Glucose, Bld: 143 mg/dL — ABNORMAL HIGH (ref 70–99)
Potassium: 3.2 mmol/L — ABNORMAL LOW (ref 3.5–5.1)
SODIUM: 141 mmol/L (ref 135–145)

## 2018-06-26 LAB — CBC
HCT: 47.2 % — ABNORMAL HIGH (ref 36.0–46.0)
Hemoglobin: 15.3 g/dL — ABNORMAL HIGH (ref 12.0–15.0)
MCH: 31.6 pg (ref 26.0–34.0)
MCHC: 32.4 g/dL (ref 30.0–36.0)
MCV: 97.5 fL (ref 78.0–100.0)
Platelets: 119 10*3/uL — ABNORMAL LOW (ref 150–400)
RBC: 4.84 MIL/uL (ref 3.87–5.11)
RDW: 14.6 % (ref 11.5–15.5)
WBC: 8.9 10*3/uL (ref 4.0–10.5)

## 2018-06-26 MED ORDER — TRAMADOL HCL 50 MG PO TABS: 50.0000 mg | ORAL_TABLET | Freq: Three times a day (TID) | ORAL | 0 refills | Status: DC | PRN

## 2018-06-26 MED ORDER — TRAMADOL HCL 50 MG PO TABS: 50.0000 mg | ORAL_TABLET | Freq: Four times a day (QID) | ORAL | 0 refills | Status: DC | PRN

## 2018-06-26 MED ORDER — POTASSIUM CHLORIDE CRYS ER 20 MEQ PO TBCR
40.0000 meq | EXTENDED_RELEASE_TABLET | Freq: Once | ORAL | Status: AC
  Administered 2018-06-26: 40 meq via ORAL
  Filled 2018-06-26: qty 2

## 2018-06-26 MED ORDER — METOPROLOL TARTRATE 5 MG/5ML IV SOLN
5.0000 mg | Freq: Once | INTRAVENOUS | Status: AC
  Administered 2018-06-26: 5 mg via INTRAVENOUS
  Filled 2018-06-26: qty 5

## 2018-06-26 MED ORDER — IBUPROFEN 400 MG PO TABS: 400.0000 mg | ORAL_TABLET | Freq: Four times a day (QID) | ORAL | 0 refills | Status: DC | PRN

## 2018-06-26 NOTE — Consult Note (Signed)
            [facility] CM Primary Care Navigator  06/26/2018  [name] [name] Oct 20, 1932 [id number]   Went to see patient at the bedside to identify possible discharge needs. Patient reports having a "fall that resultedto increased chest pain; when prunning her bushes, lost balance and felltoher leftside- hitting a rock which had resultedto thisadmission.(small pneumothorax and multiple rib fractures)  Patient endorsesDr.Hal Stoneking with [facility] [place] as the primary care provider.   Patient is using [facility] on [facility] to obtain medications withoutdifficulty.   [facility] "[facility]" system filled every week.  Patient reportsthat she has been driving prior to admission butcan use facility transport to providetransportation to her doctors' appointmentsif needed after discharge.  Patientis a resident at [facility]. She statesbeing independent with self care prior to admission but had already arranged for a home aide to come 6 hours a day to assist with her needs at home.  Anticipated discharge planis home (back to [place]) with home health services.   Patientexpressedunderstanding to call primary care provider's office for a post discharge follow-up appointment within a week or sooner if needs arise.Patient letter (with PCP's contact number) was provided as a reminder.  Explained to patient about [facility] CM services available for health management at home but she denies anyneeds or concerns for nowandindicatedthatshe does not feel the needfor servicesat this point.Patient verbalizedbeing able tomanageher health and herself so far. Patient encouraged and had voiced understandingto seek referral from primary care provider to [facility] care management if necessary and appropriate for services in the near future. [facility] care management  information provided for future needs that may arise.  Patienthad only agreedand optedfor EMMI calls to follow-up withher recovery at home.  Referral made forEMMI General calls after discharge.    For additional questions please contact:  Edwena Felty A. Sharnise Blough, BSN, RN-BC [facility] PRIMARY CARE Navigator Cell: [phone]

## 2018-06-26 NOTE — Discharge Summary (Signed)
Physician Discharge Summary  [name] [name] WFU:[id number] DOB: 10/28/1932 DOA: 06/24/2018  PCP: Lajean Manes, MD  Admit date: 06/24/2018 Discharge date: 06/26/2018  [facility]: [facility] Disposition: [facility]   Recommendations for Outpatient Follow-up:  1. Follow up with PCP in 1-2 weeks  Home Health: Outpatient PT Equipment/Devices: Nonw new Discharge Condition: Stable CODE STATUS: DNR Diet recommendation: Heart healthy  Brief/Interim Summary: status post pacemaker, HTN,and gout who presented after a mechanical fall resulting in significant chest pain. She wasprunning bushes, lost her footing, and fell to her L side hitting her chest on a rock.   In the ED she was found to have a small pneumothorax and multiple rib fractures.  She was also noted to be in atrial fibrillation with RVR at 140.  Discharge Diagnoses:  Active Problems:   Mixed hyperlipidemia   Essential hypertension   Frequent falls   Atrial fibrillation with RVR (HCC)   Multiple rib fractures   Leukocytosis   Hyperkalemia   Pneumothorax, closed, traumatic  Pneumothorax, traumatic Trauma Surgery directing care - PTX appears stable on f/u CXR. continuing IS, no hypoxia or respiratory distress.   Multiple rib fractures Continue incentive spirometer. Will continue pain control.   Paroxysmal Atrial fibrillation with RVR: Paroxysms overnight while walking but largely staying in sinus rhythm. No changes to management per EP, Dr. Rayann Heman.   Hypertension BP reasonably well controlled for the situation. Monitor at follow up recommended  Hyperlipidemia Cont home medical tx   Mitral valve regurgitation   SSS s/p pacer 2006  Hyperkalemia: Resolved Hypokalemia: Replace today and recheck at follow up  Discharge Instructions Discharge Instructions    Diet - low sodium heart healthy   Complete by:  As directed    Discharge instructions   Complete by:  As directed    Follow up with Dr.  Felipa Eth or seek medical attention sooner if you experience shortness of breath, mid-chest pain, uncontrolled palpitations or another fall.   Continue home medications and also take ibuprofen for mild pain and/or tramadol for moderate or severe pain (printed prescription provided at discharge).   Increase activity slowly   Complete by:  As directed      Allergies as of 06/26/2018      Reactions   Gluten Meal Diarrhea   Itraconazole Diarrhea, Itching   Lactose Intolerance (gi) Diarrhea, Nausea Only   Statins Other (See Comments)   Severe muscle aches;,can't tolerate       Medication List    TAKE these medications   allopurinol 100 MG tablet Commonly known as:  ZYLOPRIM Take 100 mg by mouth daily.   Aloe Vera Gel Apply 1 application topically as needed (to affected areas of skin).   amiodarone 200 MG tablet Commonly known as:  PACERONE Take 1 tablet (200 mg total) by mouth daily.   apixaban 5 MG Tabs tablet Commonly known as:  ELIQUIS Take 1 tablet (5 mg total) by mouth 2 (two) times daily.   BEANO Tabs Take 1 tablet by mouth 3 (three) times daily before meals.   desonide 0.05 % ointment Commonly known as:  DESOWEN Apply 1 application topically 2 (two) times daily as needed (for irritation).   diltiazem 120 MG 24 hr capsule Commonly known as:  CARDIZEM CD Take 1 capsule (120 mg total) by mouth daily.   diltiazem 30 MG tablet Commonly known as:  CARDIZEM TAKE 1 TABLET ONCE DAILY AS NEEDED FOR PALPATATION. What changed:  See the new instructions.   ibuprofen 400 MG  tablet Commonly known as:  ADVIL,MOTRIN Take 1 tablet (400 mg total) by mouth every 6 (six) hours as needed for fever or mild pain.   LACTAID PO Take 1 tablet by mouth 3 (three) times daily before meals.   loratadine 10 MG tablet Commonly known as:  CLARITIN Take 1 tablet (10 mg total) by mouth daily. For allergies, congestion What changed:  additional instructions   LORazepam 0.5 MG  tablet Commonly known as:  ATIVAN Take 0.25 mg by mouth at bedtime as needed for sleep.   Magnesium Oxide 400 (240 Mg) MG Tabs Take 1 tablet (400 mg total) by mouth daily.   PREVAGEN 10 MG Caps Generic drug:  Apoaequorin Take 10 mg by mouth daily.   REFRESH OP Place 1 drop into both eyes 2 (two) times daily as needed (for dry eyes).   sodium chloride 0.65 % Soln nasal spray Commonly known as:  OCEAN Place 1 spray into both nostrils as needed for congestion.   traMADol 50 MG tablet Commonly known as:  ULTRAM Take 1-2 tablets (50-100 mg total) by mouth every 6 (six) hours as needed for moderate pain or severe pain.   Vitamin D3 2000 units Tabs Take 2,000 Units by mouth daily.      Follow-up Information    Lajean Manes, MD Follow up.   Specialty:  Internal Medicine Contact information: [address] [place] [address] [phone]        Deboraha Sprang, MD .   Specialty:  Cardiology Contact information: [phone] [place]  [address] [phone]          Allergies  Allergen Reactions  . Gluten Meal Diarrhea  . Itraconazole Diarrhea and Itching  . Lactose Intolerance (Gi) Diarrhea and Nausea Only  . Statins Other (See Comments)    Severe muscle aches;,can't tolerate     Consultations:  EP  Trauma  Procedures/Studies: Dg Ribs Unilateral W/chest Left  Result Date: 06/24/2018 CLINICAL DATA:  Fall with rib pain EXAM: LEFT RIBS AND CHEST - 3+ VIEW COMPARISON:  12/01/2017 FINDINGS: Single-view chest demonstrates left-sided pacing device. Small left pleural effusion with left basilar mild airspace disease. Moderate subcutaneous emphysema in the left lateral chest wall soft tissues. Small left apical pneumothorax measuring 15 mm in pleuroparenchymal separation. No midline shift. Normal heart size. Aortic atherosclerosis. Left rib series demonstrates acute left fifth, and sixth rib fractures, acute displaced eighth  ninth and tenth rib fractures. IMPRESSION: 1. Multiple acute left-sided rib fractures with small left apical pneumothorax, small amount of left pleural effusion and mild left basilar airspace disease which may reflect atelectasis or mild contusion. 2. Moderate soft tissue emphysema within the left chest wall soft tissues. Critical Value/emergent results were called by telephone at the time of interpretation on 06/24/2018 at 2:51 pm to Dr. Blanchie Serve , who verbally acknowledged these results. Electronically Signed   By: Donavan Foil M.D.   On: 06/24/2018 14:51   Dg Chest Port 1 View  Result Date: 06/25/2018 CLINICAL DATA:  History of rib fractures and pneumothorax after fall. EXAM: PORTABLE CHEST 1 VIEW COMPARISON:  06/24/2018. FINDINGS: Mediastinum hilar structures are stable. Cardiac pacer stable position. Heart size stable. Mild left base subsegmental atelectasis. Tiny left pleural effusion. Small left apical pneumothorax again noted. Interim improvement from prior exam. Left rib fractures again noted. Left chest wall subcutaneous emphysema. IMPRESSION: 1. Left rib fractures with small left apical pneumothorax again noted. Interim improvement of left apical pneumothorax from  prior exam. Left chest wall subcutaneous emphysema again noted. 2. Mild left base subsegmental atelectasis and small left pleural effusion again noted. Electronically Signed   By: Marcello Moores  Register   On: 06/25/2018 08:02    Subjective: Feels well. Pain controlled. Up and walking independently, found standing over sink brushing teeth this morning. HR in 80's, sinus on telemetry. No chest pain, dyspnea. Left rib/side pain is stable, improving slowly with her learning which movements to avoid. Using IS.  Discharge Exam: Vitals:   06/25/18 1708 06/26/18 0100  BP: (!) 164/76 (!) 176/75  Pulse: 63 67  Resp: 18 16  Temp: 98.4 F (36.9 C) 98.2 F (36.8 C)  SpO2: 92% 92%   General: Pt is alert, awake, not in acute  distress Cardiovascular: RRR, S1/S2 +, no rubs, no gallops Respiratory: CTA bilaterally, no wheezing, no rhonchi Abdominal: Soft, NT, ND, bowel sounds + Extremities: Trace LE edema, no cyanosis MSK: Tenderness to palpation over left latera chest wall.   Labs: BNP (last 3 results) Recent Labs    12/01/17 2119  BNP 01.0   Basic Metabolic Panel: Recent Labs  Lab 06/24/18 1650 06/25/18 0716 06/26/18 0229  NA 141 142 141  K 5.2* 3.6 3.2*  CL 106 110 102  CO2 22 22 24   GLUCOSE 143* 111* 143*  BUN 20 19 11   CREATININE 1.08* 1.07* 1.00  CALCIUM 9.4 8.3* 9.1  MG  --  1.9  --   PHOS  --  3.6  --    Liver Function Tests: Recent Labs  Lab 06/24/18 1650 06/25/18 0716  AST 52* 21  ALT 29 22  ALKPHOS 83 63  BILITOT 1.9* 1.1  PROT 7.0 5.6*  ALBUMIN 4.3 3.3*   No results for input(s): LIPASE, AMYLASE in the last 168 hours. No results for input(s): AMMONIA in the last 168 hours. CBC: Recent Labs  Lab 06/24/18 1650 06/25/18 0716 06/26/18 0229  WBC 15.2* 7.1 8.9  NEUTROABS 13.5*  --   --   HGB 16.0* 13.1 15.3*  HCT 50.1* 40.1 47.2*  MCV 98.6 97.1 97.5  PLT 144* 119* 119*   Cardiac Enzymes: Recent Labs  Lab 06/24/18 1955 06/25/18 0014 06/25/18 0716  TROPONINI <0.03 <0.03 <0.03   BNP: Invalid input(s): POCBNP CBG: No results for input(s): GLUCAP in the last 168 hours. D-Dimer No results for input(s): DDIMER in the last 72 hours. Hgb A1c No results for input(s): HGBA1C in the last 72 hours. Lipid Profile No results for input(s): CHOL, HDL, LDLCALC, TRIG, CHOLHDL, LDLDIRECT in the last 72 hours. Thyroid function studies Recent Labs    06/25/18 0014  TSH 7.947*   Anemia work up No results for input(s): VITAMINB12, FOLATE, FERRITIN, TIBC, IRON, RETICCTPCT in the last 72 hours. Urinalysis    Component Value Date/Time   COLORURINE YELLOW 06/29/2014 1421   APPEARANCEUR CLEAR 06/29/2014 1421   LABSPEC 1.011 06/29/2014 1421   PHURINE 5.0 06/29/2014 1421    GLUCOSEU NEGATIVE 06/29/2014 1421   HGBUR NEGATIVE 06/29/2014 1421   BILIRUBINUR negative 04/24/2017 1722   KETONESUR negative 04/24/2017 1722   KETONESUR NEGATIVE 06/29/2014 1421   PROTEINUR negative 04/24/2017 1722   PROTEINUR NEGATIVE 06/29/2014 1421   UROBILINOGEN 0.2 04/24/2017 1722   UROBILINOGEN 0.2 06/29/2014 1421   NITRITE Negative 04/24/2017 1722   NITRITE NEGATIVE 06/29/2014 1421   LEUKOCYTESUR Negative 04/24/2017 1722    Microbiology No results found for this or any previous visit (from the past 240 hour(s)).  Time coordinating discharge: Approximately 40  minutes  Patrecia Pour, MD  [facility] 06/26/2018, 7:48 AM Pager [phone]

## 2018-06-26 NOTE — Progress Notes (Signed)
Pt had increased HR A-fib into 120s, brief run in 140s. Correlated with patient ambulation. Pt. Not symptomatic. Provider Baltazar Najjar notified. Will continue to monitor.

## 2018-06-26 NOTE — Care Management Note (Signed)
Case Management Note  Patient Details  Name: [name] [name] MRN: [id number] Date of Birth: [birth year]  Subjective/Objective:   From [facility], presents with mechanical fall, has small ptx, rib fx's, afib with rvr.  Poss dc on 7/31. Await pt/ot eval.  7/31 [name] RN, BSN - per pt eval rec HHPT, at [facility] they use [facility] for outpt physical therapy, will need hhpt order.  Also patient will have [facility] with her, [name] with [facility] has call to set this up with them for today someone from [facility] will be there from 3 pm t 6 pm and then for 2 weeks someone will be with patient from 11 am to 5 pm, which is  What patient requested . NCM received  Call from [name] with  [facility] and they will have somone come out today and also do an assessment.  Also they will call her in the room to see if she needs transport as well.  Will need to fax dc summary to [phone].                                   Action/Plan: DC back to indep living today to [facility] with [facility] services.  Expected Discharge Date:  06/26/18               Expected Discharge Plan:  Gross  In-House Referral:     Discharge planning Services  CM Consult  Post Acute Care Choice:  Home Health Choice offered to:  Patient  DME Arranged:    DME Agency:     HH Arranged:  PT [place]:  Other - See comment  Status of Service:  Completed, signed off  If discussed at [place] of Stay Meetings, dates discussed:    Additional Comments:  [name], RN 06/26/2018, 9:51 AM

## 2018-06-26 NOTE — Evaluation (Signed)
Occupational Therapy Evaluation Patient Details Name: [name] [name] MRN: [id number] DOB: Apr 04, 1932 Today's Date: 06/26/2018    History of Present Illness 84yo F w/ a hx of paroxysmal A.fib on eliquis, CVA, depression, DVT, mitral valve regurg, status post pacemaker, HTN, and gout who presented after a mechanical fall resulting in significant chest pain. In the ED she was found to have a small pneumothorax and multiple rib fractures.    Clinical Impression   This 84 y/o female presents with the above. At baseline pt is independent with ADLs and functional mobility. Pt demonstrating room/hallway level functional mobility without AD and minguard assist this session; Currently requires minguard assist for LB ADLs. Pt with modest instability during mobility though with no overt LOB, does demonstrate decreased safety awareness and STM deficits, is mostly limited by pain during this session. Pt will benefit from continued acute OT services while she remains in acute setting. Would also benefit from [facility] program with [facility] after discharge home if pt is eligible. Will continue to follow.     Follow Up Recommendations  No OT follow up;Supervision/Assistance - 24 hour;Other (comment)(24hr initially; may benefit from HomeFirst with [facility] if eligible )    Equipment Recommendations  None recommended by OT           Precautions / Restrictions Precautions Precautions: Fall Restrictions Weight Bearing Restrictions: No      Mobility Bed Mobility               General bed mobility comments: OOB in recliner   Transfers Overall transfer level: Needs assistance Equipment used: None Transfers: Sit to/from Stand Sit to Stand: Supervision         General transfer comment: supervision for safety    Balance Overall balance assessment: Needs assistance Sitting-balance support: No upper extremity supported;Feet unsupported Sitting balance-Leahy Scale: Good     Standing  balance support: No upper extremity supported;During functional activity Standing balance-Leahy Scale: Good                             ADL either performed or assessed with clinical judgement   ADL Overall ADL's : Needs assistance/impaired Eating/Feeding: Independent;Sitting   Grooming: Min guard;Supervision/safety;Standing;Wash/dry hands   Upper Body Bathing: Supervision/ safety;Sitting   Lower Body Bathing: Min guard;Sit to/from stand   Upper Body Dressing : Modified independent;Sitting   Lower Body Dressing: Min guard;Sit to/from stand Lower Body Dressing Details (indicate cue type and reason): pt able to reach and adjust socks using modified figure 4 with no increase in pain levels in ribcage; minguard for static standing balance  Toilet Transfer: Supervision/safety;Min guard;Ambulation;Regular Glass blower/designer Details (indicate cue type and reason): simulated in transfer to/from recliner  Toileting- Water quality scientist and Hygiene: Min guard;Sit to/from stand       Functional mobility during ADLs: Min guard;Supervision/safety General ADL Comments: pt with modest instability though no overt LOB; completing functional mobility in room/hallway and standing grooming ADLs. Pt with increased rib pain due to intermittent coughing spells during session      Vision         Perception     Praxis      Pertinent Vitals/Pain Pain Assessment: Faces Faces Pain Scale: Hurts even more Pain Location: ribs when coughing  Pain Descriptors / Indicators: Sharp;Sore Pain Intervention(s): Monitored during session;Limited activity within patient's tolerance     Hand Dominance     Extremity/Trunk Assessment Upper Extremity Assessment Upper Extremity Assessment: Overall [facility]  for tasks assessed   Lower Extremity Assessment Lower Extremity Assessment: Defer to PT evaluation       Communication Communication Communication: No difficulties   Cognition  Arousal/Alertness: Awake/alert Behavior During Therapy: WFL for tasks assessed/performed Overall Cognitive Status: No family/caregiver present to determine baseline cognitive functioning                                 General Comments: pt demonstrating decreased STM, very talkative and requires redirection, decreased safety awareness    General Comments       Exercises     Shoulder Instructions      Home Living Family/patient expects to be discharged to:: Private residence Living Arrangements: Alone Available Help at Discharge: Neighbor;Available PRN/intermittently Type of Home: Independent living facility Home Access: Level entry     Home Layout: One level     Bathroom Shower/Tub: Walk-in shower;Door   Bathroom Toilet: Handicapped height Bathroom Accessibility: Yes   Home Equipment: Cane - single point;Shower seat - built in;Grab bars - tub/shower          Prior Functioning/Environment Level of Independence: Independent                 OT Problem List: Impaired balance (sitting and/or standing);Decreased cognition;Pain;Decreased activity tolerance;Decreased safety awareness      OT Treatment/Interventions: Self-care/ADL training;DME and/or AE instruction;Therapeutic activities;Balance training;Therapeutic exercise;Patient/family education    OT Goals(Current goals can be found in the care plan section) Acute Rehab OT Goals Patient Stated Goal: get home to dog OT Goal Formulation: With patient Time For Goal Achievement: 07/10/18 Potential to Achieve Goals: Good  OT Frequency: Min 2X/week   Barriers to D/C:            Co-evaluation              AM-PAC PT "6 Clicks" Daily Activity     Outcome Measure Help from another person eating meals?: None Help from another person taking care of personal grooming?: None Help from another person toileting, which includes using toliet, bedpan, or urinal?: A Little Help from another person  bathing (including washing, rinsing, drying)?: A Little Help from another person to put on and taking off regular upper body clothing?: None Help from another person to put on and taking off regular lower body clothing?: A Little 6 Click Score: 21   End of Session Nurse Communication: Mobility status  Activity Tolerance: Patient tolerated treatment well Patient left: in chair;with call bell/phone within reach  OT Visit Diagnosis: History of falling (Z91.81);Pain Pain - Right/Left: Left Pain - part of body: (ribs)                Time: 3532-9924 OT Time Calculation (min): 19 min Charges:  OT General Charges $OT Visit: 1 Visit OT Evaluation $OT Eval Low Complexity: 1 Low  Lou Cal, OT Pager [phone] 06/26/2018   Raymondo Band 06/26/2018, 8:53 AM

## 2018-06-27 DIAGNOSIS — S2242XD Multiple fractures of ribs, left side, subsequent encounter for fracture with routine healing: Secondary | ICD-10-CM | POA: Diagnosis not present

## 2018-06-27 DIAGNOSIS — R2689 Other abnormalities of gait and mobility: Secondary | ICD-10-CM | POA: Diagnosis not present

## 2018-07-01 ENCOUNTER — Encounter: Payer: Self-pay | Admitting: Internal Medicine

## 2018-07-01 ENCOUNTER — Encounter (INDEPENDENT_AMBULATORY_CARE_PROVIDER_SITE_OTHER): Payer: Self-pay

## 2018-07-01 ENCOUNTER — Ambulatory Visit (INDEPENDENT_AMBULATORY_CARE_PROVIDER_SITE_OTHER): Payer: Medicare Other | Admitting: Internal Medicine

## 2018-07-01 VITALS — BP 116/68 | HR 78 | Ht 67.0 in | Wt 160.0 lb

## 2018-07-01 DIAGNOSIS — Z950 Presence of cardiac pacemaker: Secondary | ICD-10-CM | POA: Diagnosis not present

## 2018-07-01 DIAGNOSIS — I480 Paroxysmal atrial fibrillation: Secondary | ICD-10-CM

## 2018-07-01 DIAGNOSIS — I495 Sick sinus syndrome: Secondary | ICD-10-CM | POA: Diagnosis not present

## 2018-07-01 NOTE — Patient Instructions (Addendum)
Medication Instructions:  Your physician recommends that you continue on your current medications as directed. Please refer to the Current Medication list given to you today.  Labwork: None ordered.  Testing/Procedures: None ordered.  Follow-Up: Your physician wants you to follow-up in: 2 months with Roderic Palau NP at the afib clinic.  Remote monitoring is used to monitor your Pacemaker from home. This monitoring reduces the number of office visits required to check your device to one time per year. It allows [place] to keep an eye on the functioning of your device to ensure it is working properly. You are scheduled for a device check from home on 08/08/2018. You may send your transmission at any time that day. If you have a wireless device, the transmission will be sent automatically. After your physician reviews your transmission, you will receive a postcard with your next transmission date.  Any Other Special Instructions Will Be Listed Below (If Applicable).  If you need a refill on your cardiac medications before your next appointment, please call your pharmacy.

## 2018-07-01 NOTE — Progress Notes (Signed)
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  Since last being seen in our clinic, the patient reports doing reasonably well. She is please with results on amiodarone. She did recently have a traumatic fall. Today, she denies symptoms of palpitations, chest pain, shortness of breath,  lower extremity edema, dizziness, presyncope, or syncope.  The patient is otherwise without complaint today.   Past Medical History:  Diagnosis Date  . CVA    Lacunar and cerebellar infarct  . Depression   . DVT (deep venous thrombosis) ([place])    Details not available in the chart  . Edema   . Mitral valve regurgitation    a. echo 2009 mild to moderate MR  . Paroxysmal atrial fibrillation (HCC)   . Sinoatrial node dysfunction (HCC)   . Unspecified essential hypertension    Past Surgical History:  Procedure Laterality Date  . PACEMAKER INSERTION  09-14-2005   MDT EnRhythm dual chamber pacemaker implanted by Dr Lovena Le for SND  . PPM GENERATOR CHANGEOUT N/A 05/07/2017   Procedure: PPM Generator Changeout;  Surgeon: Deboraha Sprang, MD;  Location: [facility];  Service: Cardiovascular;  Laterality: N/A;    ROS- all systems are reviewed and negative except as per HPI above  Current Outpatient Medications  Medication Sig Dispense Refill  . allopurinol (ZYLOPRIM) 100 MG tablet Take 100 mg by mouth daily.    . Aloe Vera GEL Apply 1 application topically as needed (to affected areas of skin).    . Alpha-D-Galactosidase (BEANO) TABS Take 1 tablet by mouth 3 (three) times daily before meals.    Marland Kitchen amiodarone (PACERONE) 200 MG tablet Take 1 tablet (200 mg total) by mouth daily. 60 tablet 1  . apixaban (ELIQUIS) 5 MG TABS tablet Take 1 tablet (5 mg total) by mouth 2 (two) times daily. 60 tablet 10  . Apoaequorin (PREVAGEN) 10 MG CAPS Take 10 mg by mouth daily.     . Cholecalciferol (VITAMIN D3)  2000 UNITS TABS Take 2,000 Units by mouth daily.     Marland Kitchen desonide (DESOWEN) 0.05 % ointment Apply 1 application topically 2 (two) times daily as needed (for irritation).     Marland Kitchen diltiazem (CARDIZEM CD) 120 MG 24 hr capsule Take 1 capsule (120 mg total) by mouth daily. 90 capsule 3  . diltiazem (CARDIZEM) 30 MG tablet TAKE 1 TABLET ONCE DAILY AS NEEDED FOR PALPATATION. (Patient taking differently: Take 30 mg by mouth as needed for palpitations) 45 tablet 0  . ibuprofen (ADVIL,MOTRIN) 400 MG tablet Take 1 tablet (400 mg total) by mouth every 6 (six) hours as needed for fever or mild pain. 30 tablet 0  . Lactase (LACTAID PO) Take 1 tablet by mouth 3 (three) times daily before meals.    Marland Kitchen loratadine (CLARITIN) 10 MG tablet Take 1 tablet (10 mg total) by mouth daily. For allergies, congestion (Patient taking differently: Take 10 mg by mouth daily. ) 30 tablet 3  . LORazepam (ATIVAN) 0.5 MG tablet Take 0.25 mg by mouth at bedtime as needed for sleep.     . Magnesium Oxide 400 (240 Mg) MG TABS Take 1 tablet (400 mg total) by mouth daily. 90 tablet 3  . Polyvinyl Alcohol-Povidone (REFRESH OP) Place 1 drop into both eyes 2 (two) times daily as needed (for dry eyes).     . sodium chloride (OCEAN) 0.65 % SOLN nasal  spray Place 1 spray into both nostrils as needed for congestion.    . traMADol (ULTRAM) 50 MG tablet Take 1-2 tablets (50-100 mg total) by mouth every 8 (eight) hours as needed for moderate pain or severe pain. 15 tablet 0   No current facility-administered medications for this visit.     Physical Exam: Vitals:   07/01/18 1459  BP: 116/68  Pulse: 78  SpO2: 97%  Weight: 160 lb (72.6 kg)  Height: 5\' 7"  (1.702 m)    GEN- The patient is well appearing, alert and oriented x 3 today.   Head- normocephalic, atraumatic Eyes-  Sclera clear, conjunctiva pink Ears- hearing intact Oropharynx- clear Lungs- Clear to ausculation bilaterally, normal work of breathing Chest- pacemaker pocket is well  healed Heart- Regular rate and rhythm, no murmurs, rubs or gallops, PMI not laterally displaced GI- soft, NT, ND, + BS Extremities- no clubbing, cyanosis, or edema  Pacemaker interrogation- reviewed in detail today,  See PACEART report  ekg tracing ordered today is personally reviewed and shows atrial paced rhythm, RBBB, baseline artifact  Assessment and Plan:  1. Symptomatic  Bradycardia  Normal pacemaker function See Pace Art report No changes today  2. Paroxysmal atrial fibrillation Doing well with amiodarone chads2vasc score is 6.  On eliquis Labs on follow-up in AF clinic  Follow-up in AF clinic in 2 months and with Dr Caryl Comes in 6 months I will see when needed.  Thompson Grayer MD, [facility] 07/01/2018 3:05 PM

## 2018-07-04 NOTE — Addendum Note (Signed)
Addended by: Rose Phi on: 07/04/2018 01:44 PM   Modules accepted: Orders

## 2018-07-08 DIAGNOSIS — N183 Chronic kidney disease, stage 3 (moderate): Secondary | ICD-10-CM | POA: Diagnosis not present

## 2018-07-08 DIAGNOSIS — Z79899 Other long term (current) drug therapy: Secondary | ICD-10-CM | POA: Diagnosis not present

## 2018-07-08 DIAGNOSIS — I480 Paroxysmal atrial fibrillation: Secondary | ICD-10-CM | POA: Diagnosis not present

## 2018-07-08 DIAGNOSIS — S2242XA Multiple fractures of ribs, left side, initial encounter for closed fracture: Secondary | ICD-10-CM | POA: Diagnosis not present

## 2018-07-24 ENCOUNTER — Encounter: Payer: Medicare Other | Admitting: Internal Medicine

## 2018-08-08 ENCOUNTER — Ambulatory Visit (INDEPENDENT_AMBULATORY_CARE_PROVIDER_SITE_OTHER): Payer: Medicare Other | Admitting: *Deleted

## 2018-08-08 DIAGNOSIS — I495 Sick sinus syndrome: Secondary | ICD-10-CM

## 2018-08-08 NOTE — Progress Notes (Signed)
Remote pacemaker transmission.   

## 2018-08-10 IMAGING — DX DG RIBS W/ CHEST 3+V*L*
3 series · 3 of 3 positions shown · non-contrast
Comparison: 12/01/2017

CLINICAL DATA: Fall with rib pain

EXAM:
LEFT RIBS AND CHEST - 3+ VIEW

[chest pa]
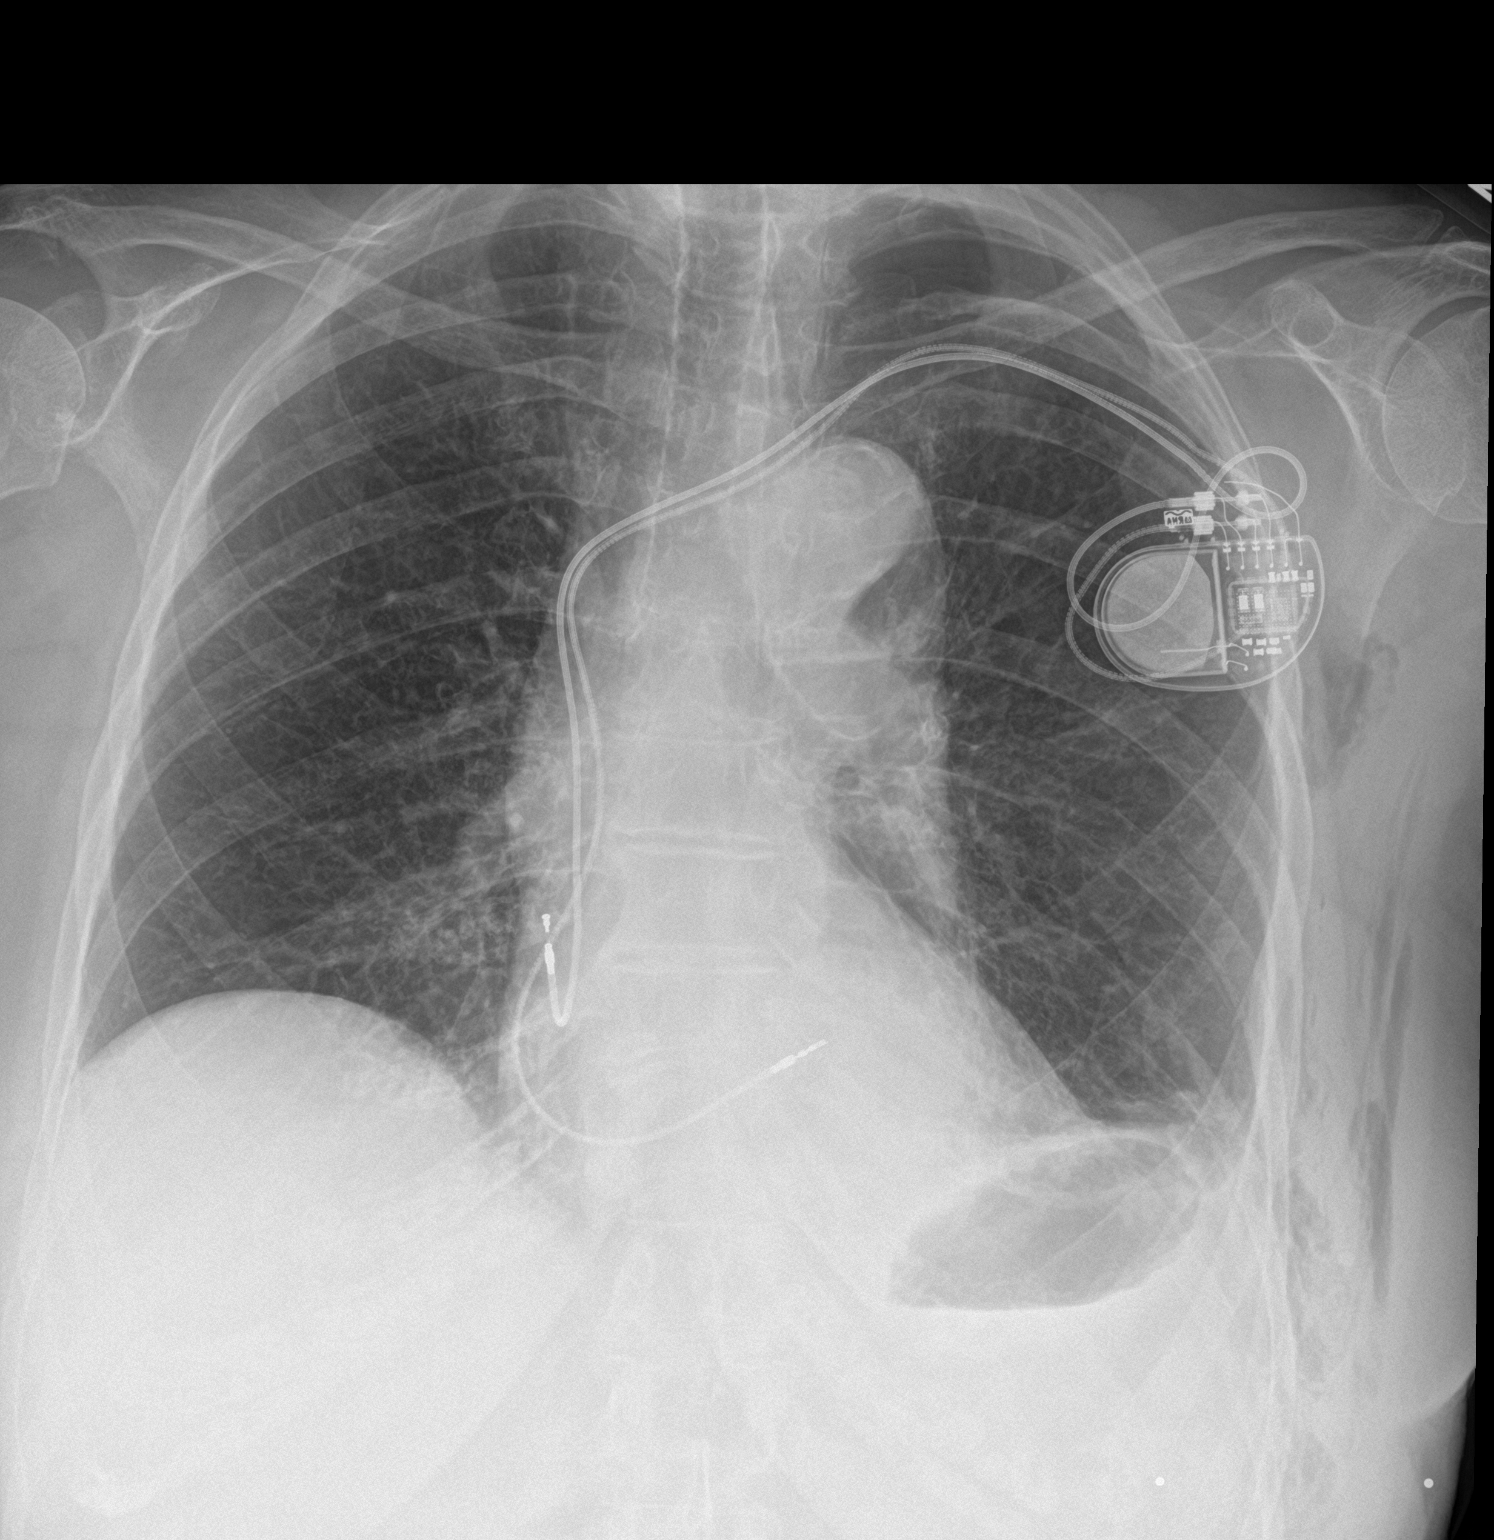

[rib obl (1 of 2)]
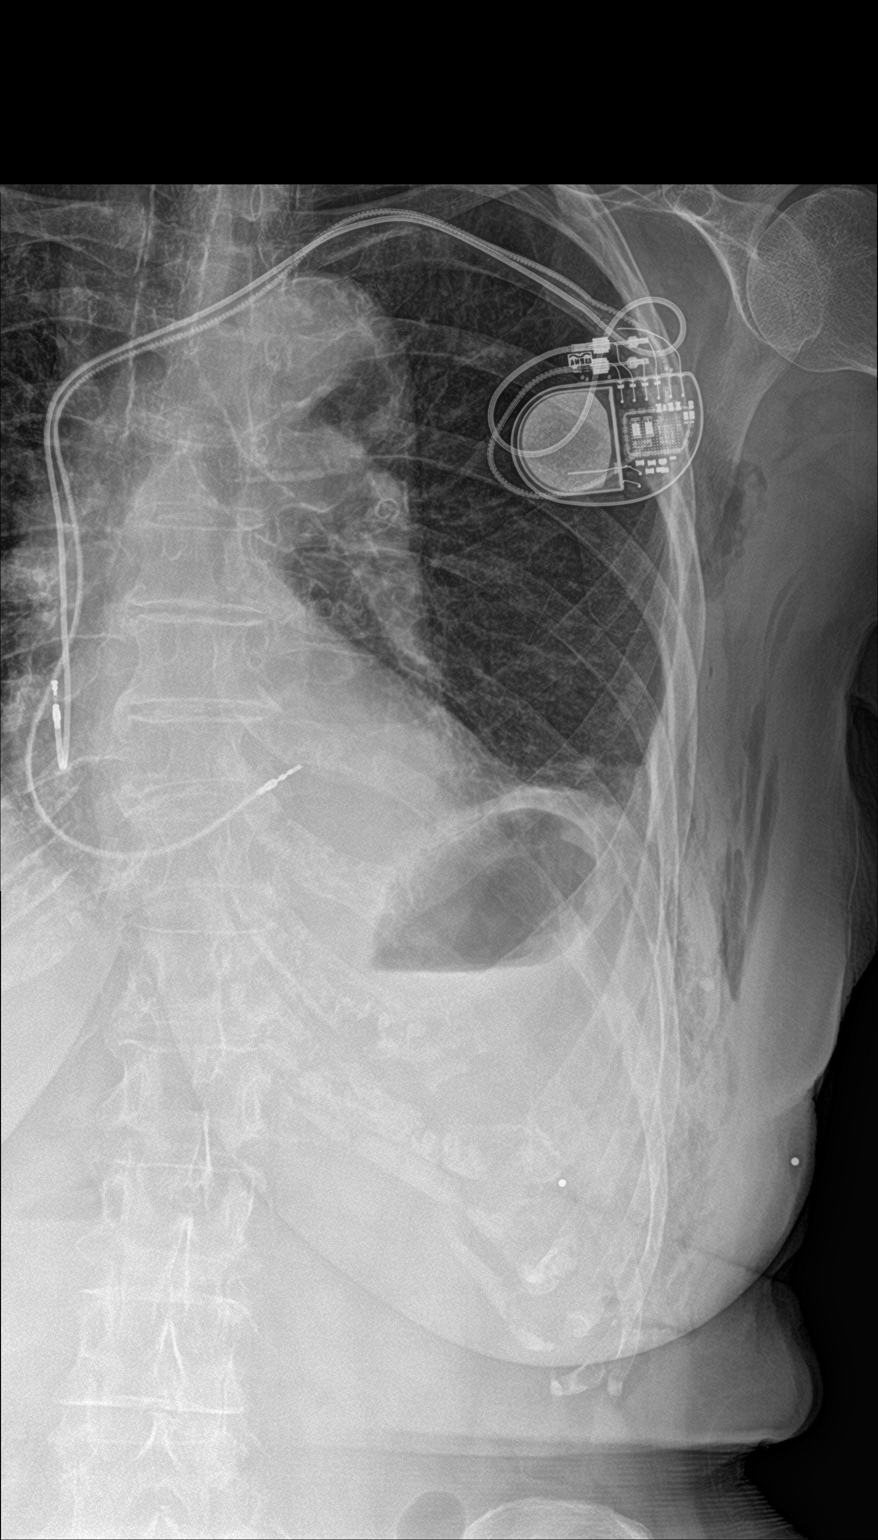

[rib obl (2 of 2)]
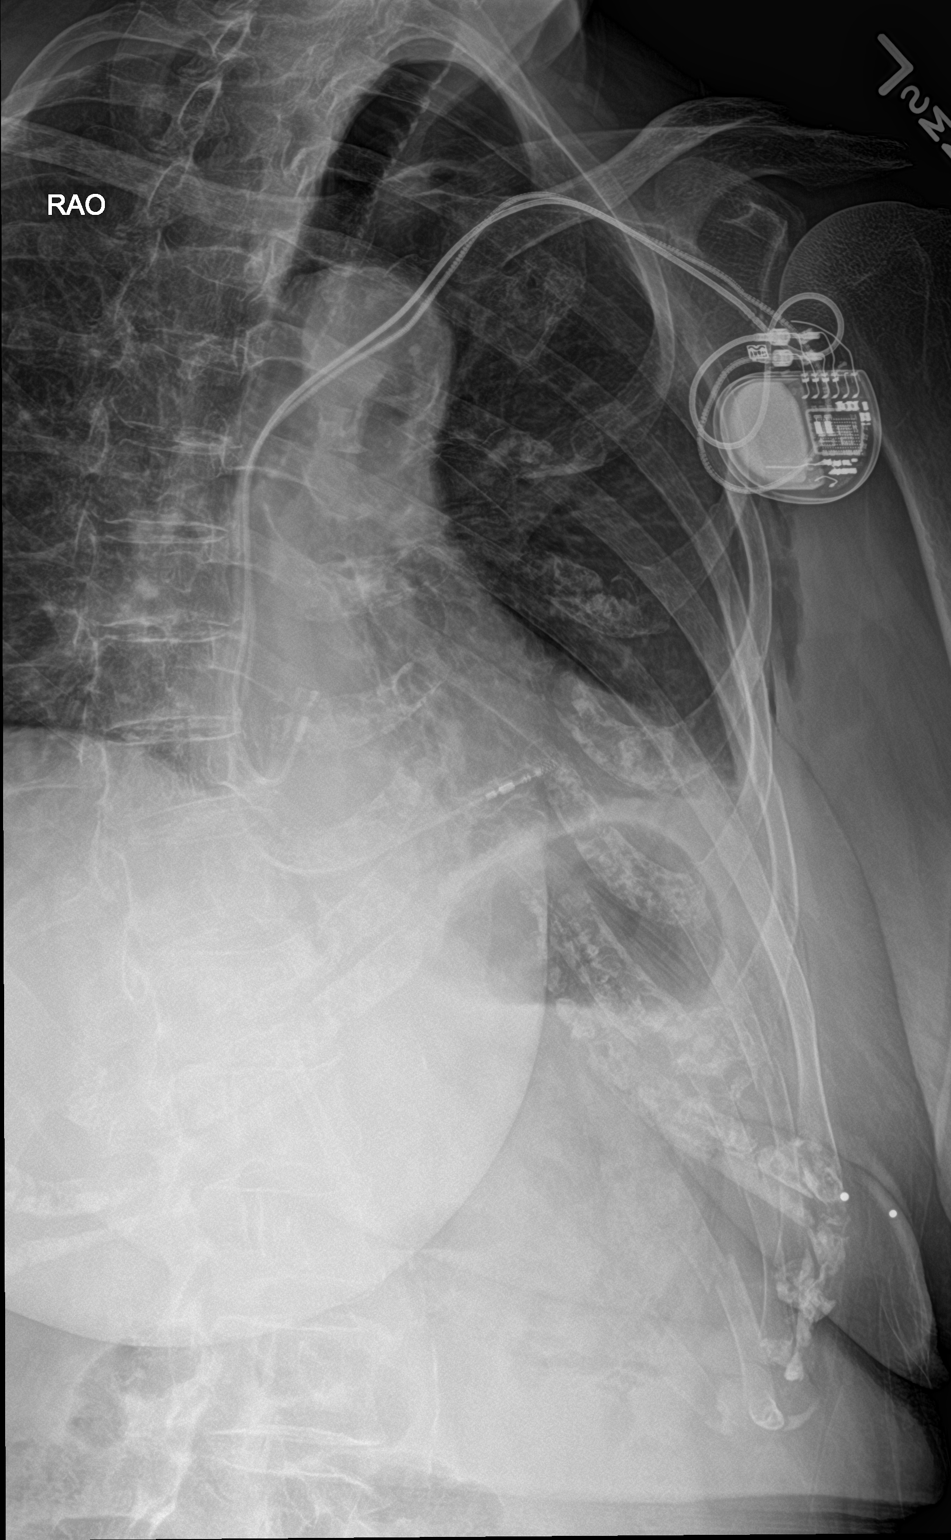

[3 of 3 positions shown; findings below may reference images not displayed]

FINDINGS: Single-view chest demonstrates left-sided pacing device. Small left
pleural effusion with left basilar mild airspace disease. Moderate
subcutaneous emphysema in the left lateral chest wall soft tissues.
Small left apical pneumothorax measuring 15 mm in pleuroparenchymal
separation. No midline shift. Normal heart size. Aortic
atherosclerosis.

Left rib series demonstrates acute left fifth, and sixth rib
fractures, acute displaced eighth ninth and tenth rib fractures.
IMPRESSION: 1. Multiple acute left-sided rib fractures with small left apical
pneumothorax, small amount of left pleural effusion and mild left
basilar airspace disease which may reflect atelectasis or mild
contusion.
2. Moderate soft tissue emphysema within the left chest wall soft
tissues.

Critical Value/emergent results were called by telephone at the time
of interpretation on 06/24/2018 at [DATE] to Dr. BLAINE NOWAK , who
verbally acknowledged these results.

## 2018-08-11 IMAGING — DX DG CHEST 1V PORT
1 series · 1 of 1 positions shown · non-contrast
Comparison: 06/24/2018.

CLINICAL DATA: History of rib fractures and pneumothorax after
fall.

EXAM:
PORTABLE CHEST 1 VIEW

[chest ap]
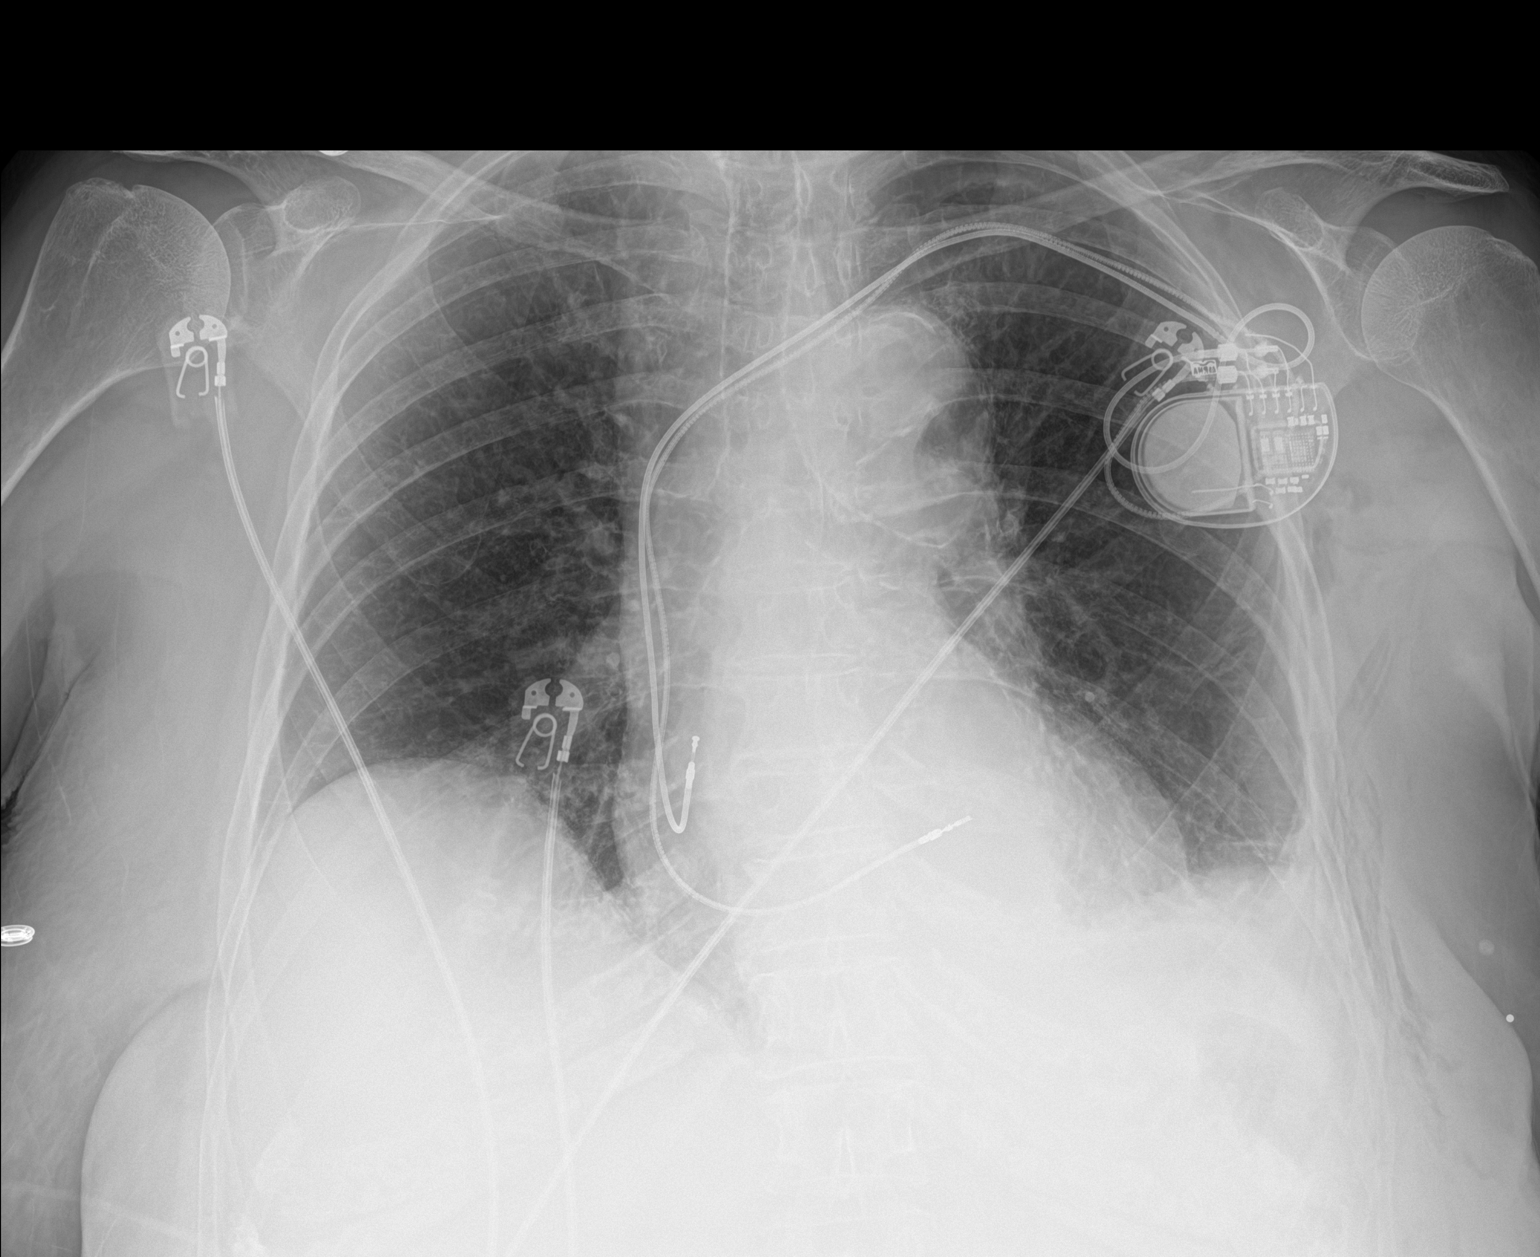

[1 of 1 positions shown; findings below may reference images not displayed]

FINDINGS: Mediastinum hilar structures are stable. Cardiac pacer stable
position. Heart size stable. Mild left base subsegmental
atelectasis. Tiny left pleural effusion. Small left apical
pneumothorax again noted. Interim improvement from prior exam. Left
rib fractures again noted. Left chest wall subcutaneous emphysema.
IMPRESSION: 1. Left rib fractures with small left apical pneumothorax again
noted. Interim improvement of left apical pneumothorax from prior
exam. Left chest wall subcutaneous emphysema again noted.

2. Mild left base subsegmental atelectasis and small left pleural
effusion again noted.

## 2018-08-19 ENCOUNTER — Other Ambulatory Visit: Payer: Self-pay | Admitting: Internal Medicine

## 2018-08-20 LAB — CUP PACEART INCLINIC DEVICE CHECK
Battery Remaining Longevity: 163 mo
Battery Voltage: 3.06 V
Brady Statistic AS VS Percent: 29.42 %
Date Time Interrogation Session: 20190805190334
Implantable Lead Location: 753859
Lead Channel Impedance Value: 323 Ohm
Lead Channel Impedance Value: 380 Ohm
Lead Channel Impedance Value: 532 Ohm
Lead Channel Pacing Threshold Amplitude: 0.5 V
Lead Channel Pacing Threshold Amplitude: 1 V
Lead Channel Pacing Threshold Pulse Width: 0.4 ms
Lead Channel Sensing Intrinsic Amplitude: 2.25 mV
Lead Channel Setting Pacing Amplitude: 2.5 V
Lead Channel Setting Pacing Pulse Width: 0.4 ms
Lead Channel Setting Sensing Sensitivity: 0.9 mV
MDC IDC LEAD IMPLANT DT: 20061019
MDC IDC LEAD IMPLANT DT: 20061019
MDC IDC LEAD LOCATION: 753860
MDC IDC MSMT LEADCHNL RA IMPEDANCE VALUE: 589 Ohm
MDC IDC MSMT LEADCHNL RA PACING THRESHOLD PULSEWIDTH: 0.4 ms
MDC IDC MSMT LEADCHNL RV SENSING INTR AMPL: 3.625 mV
MDC IDC PG IMPLANT DT: 20180611
MDC IDC SET LEADCHNL RA PACING AMPLITUDE: 1.5 V
MDC IDC STAT BRADY AP VP PERCENT: 0.06 %
MDC IDC STAT BRADY AP VS PERCENT: 70.63 %
MDC IDC STAT BRADY AS VP PERCENT: 0.02 %
MDC IDC STAT BRADY RA PERCENT PACED: 52.98 %
MDC IDC STAT BRADY RV PERCENT PACED: 1.13 %

## 2018-08-28 LAB — CUP PACEART REMOTE DEVICE CHECK
Battery Remaining Longevity: 162 mo
Battery Voltage: 3.05 V
Brady Statistic AP VP Percent: 0.08 %
Brady Statistic AS VP Percent: 0.02 %
Brady Statistic RA Percent Paced: 67.83 %
Implantable Lead Implant Date: 20061019
Implantable Lead Location: 753859
Implantable Lead Model: 5076
Implantable Pulse Generator Implant Date: 20180611
Lead Channel Impedance Value: 399 Ohm
Lead Channel Impedance Value: 608 Ohm
Lead Channel Pacing Threshold Amplitude: 0.625 V
Lead Channel Pacing Threshold Pulse Width: 0.4 ms
Lead Channel Pacing Threshold Pulse Width: 0.4 ms
Lead Channel Sensing Intrinsic Amplitude: 1.875 mV
Lead Channel Sensing Intrinsic Amplitude: 1.875 mV
Lead Channel Sensing Intrinsic Amplitude: 2.75 mV
Lead Channel Setting Pacing Amplitude: 1.5 V
Lead Channel Setting Sensing Sensitivity: 0.9 mV
MDC IDC LEAD IMPLANT DT: 20061019
MDC IDC LEAD LOCATION: 753860
MDC IDC MSMT LEADCHNL RA IMPEDANCE VALUE: 665 Ohm
MDC IDC MSMT LEADCHNL RV IMPEDANCE VALUE: 323 Ohm
MDC IDC MSMT LEADCHNL RV PACING THRESHOLD AMPLITUDE: 0.875 V
MDC IDC MSMT LEADCHNL RV SENSING INTR AMPL: 2.75 mV
MDC IDC SESS DTM: 20190912114150
MDC IDC SET LEADCHNL RV PACING AMPLITUDE: 2.5 V
MDC IDC SET LEADCHNL RV PACING PULSEWIDTH: 0.4 ms
MDC IDC STAT BRADY AP VS PERCENT: 83.52 %
MDC IDC STAT BRADY AS VS PERCENT: 16.48 %
MDC IDC STAT BRADY RV PERCENT PACED: 0.67 %

## 2018-08-30 ENCOUNTER — Other Ambulatory Visit: Payer: Self-pay | Admitting: Cardiovascular Disease

## 2018-08-30 NOTE — Telephone Encounter (Signed)
Pt last saw [name] 07/01/18, last labs 06/26/18 Creat 1.00, age 84, weight 72.6kg, based on specified criteria pt is on appropriate dosage of Eliquis 5mg  BID.  Will refill rx.

## 2018-09-02 ENCOUNTER — Ambulatory Visit (HOSPITAL_COMMUNITY)
Admission: RE | Admit: 2018-09-02 | Discharge: 2018-09-02 | Disposition: A | Discharge status: Home / Self Care | Payer: Medicare Other | Source: Ambulatory Visit | Attending: Nurse Practitioner | Admitting: Nurse Practitioner

## 2018-09-02 ENCOUNTER — Encounter (HOSPITAL_COMMUNITY): Payer: Self-pay | Admitting: Nurse Practitioner

## 2018-09-02 VITALS — BP 142/84 | HR 79 | Ht 67.0 in | Wt 158.0 lb

## 2018-09-02 DIAGNOSIS — Z8673 Personal history of transient ischemic attack (TIA), and cerebral infarction without residual deficits: Secondary | ICD-10-CM | POA: Insufficient documentation

## 2018-09-02 DIAGNOSIS — Z86718 Personal history of other venous thrombosis and embolism: Secondary | ICD-10-CM | POA: Insufficient documentation

## 2018-09-02 DIAGNOSIS — Z87891 Personal history of nicotine dependence: Secondary | ICD-10-CM | POA: Diagnosis not present

## 2018-09-02 DIAGNOSIS — I10 Essential (primary) hypertension: Secondary | ICD-10-CM | POA: Insufficient documentation

## 2018-09-02 DIAGNOSIS — Z8249 Family history of ischemic heart disease and other diseases of the circulatory system: Secondary | ICD-10-CM | POA: Insufficient documentation

## 2018-09-02 DIAGNOSIS — Z791 Long term (current) use of non-steroidal anti-inflammatories (NSAID): Secondary | ICD-10-CM | POA: Diagnosis not present

## 2018-09-02 DIAGNOSIS — Z79899 Other long term (current) drug therapy: Secondary | ICD-10-CM | POA: Insufficient documentation

## 2018-09-02 DIAGNOSIS — Z883 Allergy status to other anti-infective agents status: Secondary | ICD-10-CM | POA: Diagnosis not present

## 2018-09-02 DIAGNOSIS — Z888 Allergy status to other drugs, medicaments and biological substances status: Secondary | ICD-10-CM | POA: Diagnosis not present

## 2018-09-02 DIAGNOSIS — F329 Major depressive disorder, single episode, unspecified: Secondary | ICD-10-CM | POA: Insufficient documentation

## 2018-09-02 DIAGNOSIS — Z950 Presence of cardiac pacemaker: Secondary | ICD-10-CM | POA: Diagnosis not present

## 2018-09-02 DIAGNOSIS — I480 Paroxysmal atrial fibrillation: Secondary | ICD-10-CM | POA: Diagnosis not present

## 2018-09-02 DIAGNOSIS — Z7901 Long term (current) use of anticoagulants: Secondary | ICD-10-CM | POA: Insufficient documentation

## 2018-09-02 LAB — TSH: TSH: 4.876 u[IU]/mL — ABNORMAL HIGH (ref 0.350–4.500)

## 2018-09-02 NOTE — Progress Notes (Signed)
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 female with a h/o PPM, CVA, paroxysmal afib that is in the afib clinic for f/u from start of amiodarone by Dr. Rayann Heman. She had been found to have increase in afib burden and her propafenone was ineffective. She is in SR today and has done well with the start of drug. She had a traumatic fall in July  with several rib fractures and subsequent pneumothorax, resolved. EKG shows a paced, v sensed today.  Today, she denies symptoms of palpitations, chest pain, shortness of breath, orthopnea, PND, lower extremity edema, dizziness, presyncope, syncope, or neurologic sequela. The patient is tolerating medications without difficulties and is otherwise without complaint today.   Past Medical History:  Diagnosis Date  . CVA    Lacunar and cerebellar infarct  . Depression   . DVT (deep venous thrombosis) ([place])    Details not available in the chart  . Edema   . Mitral valve regurgitation    a. echo 2009 mild to moderate MR  . Paroxysmal atrial fibrillation (HCC)   . Sinoatrial node dysfunction (HCC)   . Unspecified essential hypertension    Past Surgical History:  Procedure Laterality Date  . PACEMAKER INSERTION  09-14-2005   MDT EnRhythm dual chamber pacemaker implanted by Dr Lovena Le for SND  . PPM GENERATOR CHANGEOUT N/A 05/07/2017   Procedure: PPM Generator Changeout;  Surgeon: Deboraha Sprang, MD;  Location: [facility];  Service: Cardiovascular;  Laterality: N/A;    Current Outpatient Medications  Medication Sig Dispense Refill  . allopurinol (ZYLOPRIM) 100 MG tablet Take 100 mg by mouth daily.    . Aloe Vera GEL Apply 1 application topically as needed (to affected areas of skin).    . Alpha-D-Galactosidase (BEANO) TABS Take 1 tablet by mouth 3 (three) times daily before meals.    Marland Kitchen amiodarone (PACERONE) 200 MG tablet Take 1 tablet (200 mg total) by mouth daily. 90  tablet 3  . Apoaequorin (PREVAGEN) 10 MG CAPS Take 10 mg by mouth daily.     . Cholecalciferol (VITAMIN D3) 2000 UNITS TABS Take 2,000 Units by mouth daily.     Marland Kitchen desonide (DESOWEN) 0.05 % ointment Apply 1 application topically 2 (two) times daily as needed (for irritation).     Marland Kitchen diltiazem (CARDIZEM CD) 120 MG 24 hr capsule Take 1 capsule (120 mg total) by mouth daily. 90 capsule 3  . diltiazem (CARDIZEM) 30 MG tablet TAKE 1 TABLET ONCE DAILY AS NEEDED FOR PALPATATION. (Patient taking differently: Take 30 mg by mouth as needed for palpitations) 45 tablet 0  . ELIQUIS 5 MG TABS tablet TAKE 1 TABLET BY MOUTH TWICE DAILY. 60 tablet 8  . ibuprofen (ADVIL,MOTRIN) 400 MG tablet Take 1 tablet (400 mg total) by mouth every 6 (six) hours as needed for fever or mild pain. 30 tablet 0  . Lactase (LACTAID PO) Take 1 tablet by mouth 3 (three) times daily before meals.    Marland Kitchen loratadine (CLARITIN) 10 MG tablet Take 1 tablet (10 mg total) by mouth daily. For allergies, congestion (Patient taking differently: Take 10 mg by mouth daily. ) 30 tablet 3  . LORazepam (ATIVAN) 0.5 MG tablet Take 0.25 mg by mouth at bedtime as needed for sleep.     . Magnesium Oxide 400 (240 Mg) MG TABS Take 1 tablet (400 mg total) by mouth daily. 90 tablet 3  . Polyvinyl Alcohol-Povidone (  REFRESH OP) Place 1 drop into both eyes 2 (two) times daily as needed (for dry eyes).     . sodium chloride (OCEAN) 0.65 % SOLN nasal spray Place 1 spray into both nostrils as needed for congestion.    . traMADol (ULTRAM) 50 MG tablet Take 1-2 tablets (50-100 mg total) by mouth every 8 (eight) hours as needed for moderate pain or severe pain. 15 tablet 0   No current facility-administered medications for this encounter.     Allergies  Allergen Reactions  . Gluten Meal Diarrhea  . Itraconazole Diarrhea and Itching  . Lactose Intolerance (Gi) Diarrhea and Nausea Only  . Statins Other (See Comments)    Severe muscle aches;,can't tolerate      Social History   Socioeconomic History  . Marital status: Widowed    Spouse name: Not on file  . Number of children: Not on file  . Years of education: Not on file  . Highest education level: Not on file  Occupational History  . Not on file  Social Needs  . Financial resource strain: Not on file  . Food insecurity:    Worry: Not on file    Inability: Not on file  . Transportation needs:    Medical: Not on file    Non-medical: Not on file  Tobacco Use  . Smoking status: Former Smoker    Last attempt to quit: 08/13/1984    Years since quitting: 34.0  . Smokeless tobacco: Never Used  Substance and Sexual Activity  . Alcohol use: Yes    Alcohol/week: 14.0 standard drinks    Types: 14 Glasses of wine per week  . Drug use: No  . Sexual activity: Not Currently  Lifestyle  . Physical activity:    Days per week: Not on file    Minutes per session: Not on file  . Stress: Not on file  Relationships  . Social connections:    Talks on phone: Not on file    Gets together: Not on file    Attends religious service: Not on file    Active member of club or organization: Not on file    Attends meetings of clubs or organizations: Not on file    Relationship status: Not on file  . Intimate partner violence:    Fear of current or ex partner: Not on file    Emotionally abused: Not on file    Physically abused: Not on file    Forced sexual activity: Not on file  Other Topics Concern  . Not on file  Social History Narrative  . Not on file    Family History  Problem Relation Age of Onset  . Cancer Mother   . Mitral valve prolapse Mother   . Diabetes Father   . Hypertension Father   . Coronary artery disease Sister   . Stroke Brother   . Coronary artery disease Sister     ROS- All systems are reviewed and negative except as per the HPI above  Physical Exam: Vitals:   09/02/18 1348  BP: (!) 142/84  Pulse: 79  Weight: 71.7 kg  Height: 5\' 7"  (1.702 m)   Wt Readings  from Last 3 Encounters:  09/02/18 71.7 kg  07/01/18 72.6 kg  06/24/18 71.8 kg    Labs: Lab Results  Component Value Date   NA 141 06/26/2018   K 3.2 (L) 06/26/2018   CL 102 06/26/2018   CO2 24 06/26/2018   GLUCOSE 143 (H) 06/26/2018  BUN 11 06/26/2018   CREATININE 1.00 06/26/2018   CALCIUM 9.1 06/26/2018   PHOS 3.6 06/25/2018   MG 1.9 06/25/2018   Lab Results  Component Value Date   INR 1.0 05/01/2017   Lab Results  Component Value Date   CHOL 219 (H) 12/02/2017   HDL 58 12/02/2017   LDLCALC 137 (H) 12/02/2017   TRIG 119 12/02/2017     GEN- The patient is well appearing, alert and oriented x 3 today.   Head- normocephalic, atraumatic Eyes-  Sclera clear, conjunctiva pink Ears- hearing intact Oropharynx- clear Neck- supple, no JVP Lymph- no cervical lymphadenopathy Lungs- Clear to ausculation bilaterally, normal work of breathing Heart- Regular rate and rhythm, no murmurs, rubs or gallops, PMI not laterally displaced GI- soft, NT, ND, + BS Extremities- no clubbing, cyanosis, or edema MS- no significant deformity or atrophy Skin- no rash or lesion Psych- euthymic mood, full affect Neuro- strength and sensation are intact  EKG-a paced at 79 bpm, pr int 204 ms, qrs int 122 ms, qtc 499 ms Epic records reviewed     Assessment and Plan: 1. Paroxysmal afib  Did not tolerate increase in propafenone 2/2 neurotoxicity Now off drug and has been on amiodarone 200 mg qd for several; months, tolerating well and in SR Feels improved Continue eliquis 5 mg bid for chadsvasc score of at least 5  2. PPM Per Dr. Caryl Comes and device clinc  F/u with Dr. Caryl Comes Dr. Johnsie Cancel per recall afib clinic as needed  Geroge Baseman. Valma Rotenberg, [facility] [address] [place], [place] [id number] [phone]

## 2018-10-07 ENCOUNTER — Encounter: Payer: Self-pay | Admitting: Cardiovascular Disease

## 2018-10-22 NOTE — Progress Notes (Signed)
Patient ID: [name] [name], female   DOB: 10/30/1932, 84 y.o.   MRN: [id number]     [name] [name] is a 84 y.o. female f/u PAF, PPM and chronic anticoagulation Seen most recently by afib clinic and Dr Blima Dessert Was on propafenone but had neuro toxicity with higher doses and now on low dose amiodarone. CHAS2VASC 6  Had symptomatic bradycardia and SSS Medtronic pacer placed. She is now on Eliquis 5 bid for anticoagulation   TTE reviewed from 12/02/17 EF 55-60%  Mild AR, mild LAE     Fell in back yard July  and broke some ribs on left side had small left apical pneumo Taken a while but back to normal now     Past Medical History:  Diagnosis Date  . CVA    Lacunar and cerebellar infarct  . Depression   . DVT (deep venous thrombosis) ([place])    Details not available in the chart  . Edema   . Mitral valve regurgitation    a. echo 2009 mild to moderate MR  . Paroxysmal atrial fibrillation (HCC)   . Sinoatrial node dysfunction (HCC)   . Unspecified essential hypertension     Past Surgical History:  Procedure Laterality Date  . PACEMAKER INSERTION  09-14-2005   MDT EnRhythm dual chamber pacemaker implanted by Dr Lovena Le for SND  . PPM GENERATOR CHANGEOUT N/A 05/07/2017   Procedure: PPM Generator Changeout;  Surgeon: Deboraha Sprang, MD;  Location: [facility];  Service: Cardiovascular;  Laterality: N/A;    Current Outpatient Medications  Medication Sig Dispense Refill  . allopurinol (ZYLOPRIM) 100 MG tablet Take 100 mg by mouth daily.    . Aloe Vera GEL Apply 1 application topically as needed (to affected areas of skin).    . Alpha-D-Galactosidase (BEANO) TABS Take 1 tablet by mouth 3 (three) times daily before meals.    Marland Kitchen amiodarone (PACERONE) 200 MG tablet Take 1 tablet (200 mg total) by mouth daily. 90 tablet 3  . Apoaequorin (PREVAGEN) 10 MG CAPS Take 10 mg by mouth daily.     . Cholecalciferol (VITAMIN D3) 2000 UNITS TABS Take 2,000 Units by mouth daily.       Marland Kitchen desonide (DESOWEN) 0.05 % ointment Apply 1 application topically 2 (two) times daily as needed (for irritation).     Marland Kitchen diltiazem (CARDIZEM CD) 120 MG 24 hr capsule Take 1 capsule (120 mg total) by mouth daily. 90 capsule 3  . diltiazem (CARDIZEM) 30 MG tablet TAKE 1 TABLET ONCE DAILY AS NEEDED FOR PALPATATION. (Patient taking differently: Take 30 mg by mouth as needed for palpitations) 45 tablet 0  . ELIQUIS 5 MG TABS tablet TAKE 1 TABLET BY MOUTH TWICE DAILY. 60 tablet 8  . ibuprofen (ADVIL,MOTRIN) 400 MG tablet Take 1 tablet (400 mg total) by mouth every 6 (six) hours as needed for fever or mild pain. 30 tablet 0  . Lactase (LACTAID PO) Take 1 tablet by mouth 3 (three) times daily before meals.    Marland Kitchen loratadine (CLARITIN) 10 MG tablet Take 1 tablet (10 mg total) by mouth daily. For allergies, congestion (Patient taking differently: Take 10 mg by mouth daily. ) 30 tablet 3  . LORazepam (ATIVAN) 0.5 MG tablet Take 0.25 mg by mouth at bedtime as needed for sleep.     . Magnesium Oxide 400 (240 Mg) MG TABS Take 1 tablet (400 mg total) by mouth daily. 90 tablet 3  . Polyvinyl Alcohol-Povidone (REFRESH OP) Place 1 drop into  both eyes 2 (two) times daily as needed (for dry eyes).     . sodium chloride (OCEAN) 0.65 % SOLN nasal spray Place 1 spray into both nostrils as needed for congestion.    . traMADol (ULTRAM) 50 MG tablet Take 1-2 tablets (50-100 mg total) by mouth every 8 (eight) hours as needed for moderate pain or severe pain. 15 tablet 0   No current facility-administered medications for this visit.     Allergies  Allergen Reactions  . Gluten Meal Diarrhea  . Itraconazole Diarrhea and Itching  . Lactose Intolerance (Gi) Diarrhea and Nausea Only  . Statins Other (See Comments)    Severe muscle aches;,can't tolerate     Review of Systems negative except from HPI and PMH  Physical Exam BP 140/82   Pulse 83   Ht 5\' 7"  (1.702 m)   Wt 161 lb 3.2 oz (73.1 kg)   SpO2 98%   BMI 25.25  kg/m  Well developed and well nourished in no acute distress HENT normal with some left facial bruising  E scleral and icterus clear Neck Supple JVP flat; carotids brisk and full Clear to ausculation Regular rate and rhythm, no murmurs gallops or rub Soft with active bowel sounds No clubbing cyanosis  edema Alert and oriented, grossly normal motor and sensory function Skin Warm and Dry  ECG:  AV pacing rate 79 RBBB 09/02/18    Assessment and  Plan  Atrial fibrillation-paroxysmal :  Improved on low dose amiodarone and eliquis. F/U PFT;s /DLCO TSH 4.8 on 09/02/18 LFTls normal 06/25/18   Hypertension :  Well controlled.  Continue current medications and low sodium Dash type diet.    Elevated TSH: f/U Dr Felipa Eth on amiodarone should check at least twice/year    Pacemaker-Medtronic normal function per PACE ART f/u Dr Caryl Comes   RBBB:  Chronic no change in ECG     Jenkins Rouge

## 2018-10-28 ENCOUNTER — Ambulatory Visit (INDEPENDENT_AMBULATORY_CARE_PROVIDER_SITE_OTHER): Payer: Medicare Other | Admitting: Cardiovascular Disease

## 2018-10-28 ENCOUNTER — Encounter: Payer: Self-pay | Admitting: Cardiovascular Disease

## 2018-10-28 VITALS — BP 140/82 | HR 83 | Ht 67.0 in | Wt 161.2 lb

## 2018-10-28 DIAGNOSIS — I480 Paroxysmal atrial fibrillation: Secondary | ICD-10-CM

## 2018-10-28 DIAGNOSIS — Z79899 Other long term (current) drug therapy: Secondary | ICD-10-CM | POA: Diagnosis not present

## 2018-10-28 DIAGNOSIS — I10 Essential (primary) hypertension: Secondary | ICD-10-CM | POA: Diagnosis not present

## 2018-10-28 NOTE — Patient Instructions (Addendum)
Medication Instructions:   If you need a refill on your cardiac medications before your next appointment, please call your pharmacy.   Lab work:  If you have labs (blood work) drawn today and your tests are completely normal, you will receive your results only by: Marland Kitchen MyChart Message (if you have MyChart) OR . A paper copy in the mail If you have any lab test that is abnormal or we need to change your treatment, we will call you to review the results.  Testing/Procedures: Your physician has recommended that you have a pulmonary function test. Pulmonary Function Tests are a group of tests that measure how well air moves in and out of your lungs.  Follow-Up: At [facility], you and your health needs are our priority.  As part of our continuing mission to provide you with exceptional heart care, we have created designated Provider Care Teams.  These Care Teams include your primary Cardiologist (physician) and Advanced Practice Providers (APPs -  Physician Assistants and Nurse Practitioners) who all work together to provide you with the care you need, when you need it. You will need a follow up appointment in 12 months.  Please call our office 2 months in advance to schedule this appointment.  You may see Virl Axe, MD or one of the following Advanced Practice Providers on your designated Care Team:   Truitt Merle, NP Cecilie Kicks, NP . Kathyrn Drown, NP

## 2018-11-06 DIAGNOSIS — Z23 Encounter for immunization: Secondary | ICD-10-CM | POA: Diagnosis not present

## 2018-11-07 ENCOUNTER — Ambulatory Visit (INDEPENDENT_AMBULATORY_CARE_PROVIDER_SITE_OTHER): Payer: Medicare Other

## 2018-11-07 DIAGNOSIS — I495 Sick sinus syndrome: Secondary | ICD-10-CM

## 2018-11-08 ENCOUNTER — Encounter: Payer: Self-pay | Admitting: Cardiology

## 2018-11-08 NOTE — Progress Notes (Signed)
Remote pacemaker transmission.   

## 2018-11-09 ENCOUNTER — Encounter (HOSPITAL_COMMUNITY): Payer: Self-pay | Admitting: Emergency Medicine

## 2018-11-09 ENCOUNTER — Emergency Department (HOSPITAL_COMMUNITY): Payer: Medicare Other

## 2018-11-09 ENCOUNTER — Emergency Department (HOSPITAL_COMMUNITY)
Admission: EM | Admit: 2018-11-09 | Discharge: 2018-11-09 | Disposition: A | Discharge status: Home / Self Care | Payer: Medicare Other | Attending: Emergency Medicine | Admitting: Emergency Medicine

## 2018-11-09 DIAGNOSIS — W010XXA Fall on same level from slipping, tripping and stumbling without subsequent striking against object, initial encounter: Secondary | ICD-10-CM | POA: Insufficient documentation

## 2018-11-09 DIAGNOSIS — S52501A Unspecified fracture of the lower end of right radius, initial encounter for closed fracture: Secondary | ICD-10-CM | POA: Diagnosis not present

## 2018-11-09 DIAGNOSIS — Z79899 Other long term (current) drug therapy: Secondary | ICD-10-CM | POA: Diagnosis not present

## 2018-11-09 DIAGNOSIS — Y998 Other external cause status: Secondary | ICD-10-CM | POA: Insufficient documentation

## 2018-11-09 DIAGNOSIS — S52601A Unspecified fracture of lower end of right ulna, initial encounter for closed fracture: Secondary | ICD-10-CM | POA: Insufficient documentation

## 2018-11-09 DIAGNOSIS — Z8673 Personal history of transient ischemic attack (TIA), and cerebral infarction without residual deficits: Secondary | ICD-10-CM | POA: Diagnosis not present

## 2018-11-09 DIAGNOSIS — Z87891 Personal history of nicotine dependence: Secondary | ICD-10-CM | POA: Insufficient documentation

## 2018-11-09 DIAGNOSIS — Z86718 Personal history of other venous thrombosis and embolism: Secondary | ICD-10-CM | POA: Insufficient documentation

## 2018-11-09 DIAGNOSIS — Y9389 Activity, other specified: Secondary | ICD-10-CM | POA: Diagnosis not present

## 2018-11-09 DIAGNOSIS — Y92008 Other place in unspecified non-institutional (private) residence as the place of occurrence of the external cause: Secondary | ICD-10-CM | POA: Insufficient documentation

## 2018-11-09 DIAGNOSIS — E782 Mixed hyperlipidemia: Secondary | ICD-10-CM | POA: Diagnosis not present

## 2018-11-09 DIAGNOSIS — Z950 Presence of cardiac pacemaker: Secondary | ICD-10-CM | POA: Insufficient documentation

## 2018-11-09 DIAGNOSIS — M25531 Pain in right wrist: Secondary | ICD-10-CM | POA: Diagnosis not present

## 2018-11-09 DIAGNOSIS — Z7901 Long term (current) use of anticoagulants: Secondary | ICD-10-CM | POA: Diagnosis not present

## 2018-11-09 DIAGNOSIS — I10 Essential (primary) hypertension: Secondary | ICD-10-CM | POA: Insufficient documentation

## 2018-11-09 DIAGNOSIS — S6991XA Unspecified injury of right wrist, hand and finger(s), initial encounter: Secondary | ICD-10-CM | POA: Diagnosis present

## 2018-11-09 DIAGNOSIS — I480 Paroxysmal atrial fibrillation: Secondary | ICD-10-CM | POA: Insufficient documentation

## 2018-11-09 MED ORDER — ACETAMINOPHEN 325 MG PO TABS
650.0000 mg | ORAL_TABLET | Freq: Once | ORAL | Status: AC
  Administered 2018-11-09: 650 mg via ORAL
  Filled 2018-11-09: qty 2

## 2018-11-09 MED ORDER — TRAMADOL HCL 50 MG PO TABS: 50.0000 mg | ORAL_TABLET | Freq: Four times a day (QID) | ORAL | 0 refills | Status: DC | PRN

## 2018-11-09 MED ORDER — TRAMADOL HCL 50 MG PO TABS
50.0000 mg | ORAL_TABLET | Freq: Once | ORAL | Status: AC
  Administered 2018-11-09: 50 mg via ORAL
  Filled 2018-11-09: qty 1

## 2018-11-09 NOTE — ED Provider Notes (Signed)
[facility] EMERGENCY DEPARTMENT Provider Note   CSN: [id number] Arrival date & time: 11/09/18  5188     History   Chief Complaint Chief Complaint  Patient presents with  . Wrist Pain    HPI [name] [name] is a 84 y.o. female.  [name] [name] is a 84 y.o. female with a history of A. fib, CVA, mitral valve regurg, DVT, presents to the emergency department for evaluation of right wrist pain and deformity after a mechanical fall.  She reports that about 515 this morning she woke up after falling asleep in her living room chair, she got up to walk to the restroom but her foot got caught on the rug causing her to fall she tried to catch herself with her arms and has since had deformity and pain to the right wrist with some bruising and swelling noted.  She reports no other injuries from the fall did not hit her head, no LOC, no injuries to her other hand or wrist, no pain in the chest or abdomen, neck or back.  She did not want to worry any of her family members early in the morning, but with some help took a tramadol which significantly helped with her pain and she wrapped the area with a sock and she reports compression and support improved the pain as well, called her friend a little before 8 AM and presented for evaluation.  She reports she is continued to have some mild pain over the wrist but has no numbness or tingling, she reports swelling and bruising has worsened somewhat, she is able to move her fingers with some discomfort.     Past Medical History:  Diagnosis Date  . CVA    Lacunar and cerebellar infarct  . Depression   . DVT (deep venous thrombosis) ([place])    Details not available in the chart  . Edema   . Mitral valve regurgitation    a. echo 2009 mild to moderate MR  . Paroxysmal atrial fibrillation (HCC)   . Sinoatrial node dysfunction (HCC)   . Unspecified essential hypertension     Patient Active Problem List   Diagnosis Date Noted   . Atrial fibrillation with RVR ([place]) 06/24/2018  . Multiple rib fractures 06/24/2018  . Leukocytosis 06/24/2018  . Hyperkalemia 06/24/2018  . Pneumothorax, closed, traumatic 06/24/2018  . Atrophic vulva 05/20/2018  . Candidal vulvovaginitis 05/20/2018  . Lichen sclerosus et atrophicus 05/20/2018  . Postmenopausal bleeding 05/20/2018  . Palpitations 12/02/2017  . TIA (transient ischemic attack) 12/02/2017  . PAF (paroxysmal atrial fibrillation) ([place]) 06/29/2014  . SVT (supraventricular tachycardia) ([place]) 06/29/2014  . Frequent falls 06/29/2014  . Chronic anticoagulation 06/29/2014  . Encounter for therapeutic drug monitoring 12/31/2013  . Edema 10/21/2013  . Hair thinning 09/26/2012  . Mixed hyperlipidemia 02/15/2010  . PACEMAKER-Medtronic 08/12/2009  . MITRAL INSUFFICIENCY 02/17/2009  . Essential hypertension 02/17/2009  . SICK SINUS SYNDROME 02/17/2009  . DVT 02/17/2009    Past Surgical History:  Procedure Laterality Date  . PACEMAKER INSERTION  09-14-2005   MDT EnRhythm dual chamber pacemaker implanted by Dr Lovena Le for SND  . PPM GENERATOR CHANGEOUT N/A 05/07/2017   Procedure: PPM Generator Changeout;  Surgeon: Deboraha Sprang, MD;  Location: [facility];  Service: Cardiovascular;  Laterality: N/A;     OB History   No obstetric history on file.      Home Medications    Prior to Admission medications   Medication Sig Start Date  End Date Taking? Authorizing Provider  allopurinol (ZYLOPRIM) 100 MG tablet Take 100 mg by mouth daily.    [provider]  Aloe Vera GEL Apply 1 application topically as needed (to affected areas of skin).    [provider]  Alpha-D-Galactosidase Satira Mccallum) TABS Take 1 tablet by mouth 3 (three) times daily before meals.    [provider]  amiodarone (PACERONE) 200 MG tablet Take 1 tablet (200 mg total) by mouth daily. 08/19/18   Allred, Jeneen Rinks, MD  Apoaequorin (PREVAGEN) 10 MG CAPS Take 10 mg by mouth daily.      [provider]  Cholecalciferol (VITAMIN D3) 2000 UNITS TABS Take 2,000 Units by mouth daily.     [provider]  desonide (DESOWEN) 0.05 % ointment Apply 1 application topically 2 (two) times daily as needed (for irritation).     [provider]  diltiazem (CARDIZEM CD) 120 MG 24 hr capsule Take 1 capsule (120 mg total) by mouth daily. 05/20/18 10/28/18  Allred, Jeneen Rinks, MD  diltiazem (CARDIZEM) 30 MG tablet TAKE 1 TABLET ONCE DAILY AS NEEDED FOR PALPATATION. Patient taking differently: Take 30 mg by mouth as needed for palpitations 04/11/18   Sherran Needs, NP  ELIQUIS 5 MG TABS tablet TAKE 1 TABLET BY MOUTH TWICE DAILY. 08/30/18   Josue Hector, MD  ibuprofen (ADVIL,MOTRIN) 400 MG tablet Take 1 tablet (400 mg total) by mouth every 6 (six) hours as needed for fever or mild pain. 06/26/18   Patrecia Pour, MD  Lactase (LACTAID PO) Take 1 tablet by mouth 3 (three) times daily before meals.    [provider]  loratadine (CLARITIN) 10 MG tablet Take 1 tablet (10 mg total) by mouth daily. For allergies, congestion Patient taking differently: Take 10 mg by mouth daily.  12/03/17   Rai, Ripudeep K, MD  LORazepam (ATIVAN) 0.5 MG tablet Take 0.25 mg by mouth at bedtime as needed for sleep.     [provider]  Magnesium Oxide 400 (240 Mg) MG TABS Take 1 tablet (400 mg total) by mouth daily. 01/16/18   Deboraha Sprang, MD  Polyvinyl Alcohol-Povidone (REFRESH OP) Place 1 drop into both eyes 2 (two) times daily as needed (for dry eyes).     [provider]  sodium chloride (OCEAN) 0.65 % SOLN nasal spray Place 1 spray into both nostrils as needed for congestion.    [provider]  traMADol (ULTRAM) 50 MG tablet Take 1-2 tablets (50-100 mg total) by mouth every 8 (eight) hours as needed for moderate pain or severe pain. 06/26/18   Patrecia Pour, MD    Family History Family History  Problem Relation Age of Onset  . Cancer Mother   . Mitral valve  prolapse Mother   . Diabetes Father   . Hypertension Father   . Coronary artery disease Sister   . Stroke Brother   . Coronary artery disease Sister     Social History Social History   Tobacco Use  . Smoking status: Former Smoker    Last attempt to quit: 08/13/1984    Years since quitting: 34.2  . Smokeless tobacco: Never Used  Substance Use Topics  . Alcohol use: Yes    Alcohol/week: 14.0 standard drinks    Types: 14 Glasses of wine per week  . Drug use: No     Allergies   Gluten meal; Itraconazole; Lactose intolerance (gi); and Statins   Review of Systems Review of Systems  Constitutional: Negative  for chills and fever.  Musculoskeletal: Positive for arthralgias and joint swelling.  Skin: Positive for color change. Negative for rash and wound.  Neurological: Negative for weakness and numbness.     Physical Exam Updated Vital Signs BP (!) 157/82 (BP Location: Left Arm)   Pulse 82   Temp 97.7 F (36.5 C) (Oral)   Resp 16   SpO2 98%   Physical Exam Vitals signs and nursing note reviewed.  Constitutional:      General: She is not in acute distress.    Appearance: She is well-developed. She is not diaphoretic.  HENT:     Head: Normocephalic and atraumatic.  Eyes:     General:        Right eye: No discharge.        Left eye: No discharge.  Pulmonary:     Effort: Pulmonary effort is normal. No respiratory distress.  Musculoskeletal:     Comments: Right wrist with obvious deformity with some angulation, surrounding swelling and ecchymosis especially over the volar aspect, 2+ radial and ulnar pulses and good capillary refill, sensation intact throughout the hand, patient able to move all fingers and cardinal hand movements intact. All compartments soft.  All other joints supple and easily movable.  Neurological:     Mental Status: She is alert.     Coordination: Coordination normal.  Psychiatric:        Behavior: Behavior normal.      ED Treatments /  Results  Labs (all labs ordered are listed, but only abnormal results are displayed) Labs Reviewed - No data to display  EKG None  Radiology Dg Wrist Complete Right  Result Date: 11/09/2018 CLINICAL DATA:  Right wrist pain after fall today. EXAM: RIGHT WRIST - COMPLETE 3+ VIEW COMPARISON:  None. FINDINGS: Moderately displaced and probably comminuted fracture is seen involving the distal right radius. Moderately angulated distal right ulnar fracture is noted. Severe osteoarthritis of first carpometacarpal joint is noted. No soft tissue abnormality is noted. IMPRESSION: Moderately displaced and comminuted distal right radial fracture, with moderately angulated distal right ulnar fracture. Electronically Signed   By: Marijo Conception, M.D.   On: 11/09/2018 09:51    Procedures Procedures (including critical care time)  Medications Ordered in ED Medications - No data to display   Initial Impression / Assessment and Plan / ED Course  I have reviewed the triage vital signs and the nursing notes.  Pertinent labs & imaging results that were available during my care of the patient were reviewed by me and considered in my medical decision making (see chart for details).  Patient presents for evaluation of left wrist injury after mechanical fall.  Obvious deformity with slight angulation on exam with associated swelling and bruising.  Neurovascularly intact with good pulses, ulnar, median and radial nerve appear intact.  Patient reports good pain control with tramadol she took just prior to arrival.  No other injuries from fall.  Did not hit her head.  X-ray shows moderately displaced and comminuted right distal radial fracture with moderately angulated distal right ulnar fracture.  Will discuss with Dr. Burney Gauze with hand surgery. Dr. Francia Greaves has seen evaluated the patient as well other than reviewed films, patient may benefit from minimal reduction here in the emergency department, but with  comminuted fracture, significant reduction will be difficult.  11:09 AM Case discussed with Dr. Burney Gauze who recommends slight manipulation if there appears to be any gross angulation on exam or any concern for compromise of the  skin, and then splinting with close follow-up with him in the office next week.  Fracture manipulated at bedside with assistance of Dr. Laverta Baltimore and ortho tech and volar splint applied with grossly improved allignment, patient neurovascularly intact after splinting.  Will have patient call to schedule follow-up with Dr. Burney Gauze, provided prescription for tramadol encouraged to use Tylenol as well as ice and elevation.  Return precautions have been discussed.  Patient expresses understanding and is in agreement with plan.  Stable for discharge home at this time in good condition.  Final Clinical Impressions(s) / ED Diagnoses   Final diagnoses:  Closed fracture of distal end of right radius, unspecified fracture morphology, initial encounter  Closed fracture of distal end of right ulna, unspecified fracture morphology, initial encounter    ED Discharge Orders         Ordered    traMADol (ULTRAM) 50 MG tablet  Every 6 hours PRN     11/09/18 1212           Jacqlyn Larsen, [place] 11/09/18 1213    Valarie Merino, MD 11/10/18 [phone]

## 2018-11-09 NOTE — Discharge Instructions (Signed)
You have fractured both bones in the wrist from your fall, you will need to follow-up with Dr. Burney Gauze with hand surgery please remain in your splint and keep it clean and dry until you are seen by him, ice and elevate the wrist, use Tylenol every 6 hours and tramadol as needed for pain.  Return to the emergency department if you have significantly worsened pain, numbness or tingling in your fingers, discoloration of the fingers or any other new or concerning symptoms.

## 2018-11-09 NOTE — ED Notes (Signed)
Pt stable, ambulatory, states understanding of discharge instructions 

## 2018-11-09 NOTE — Progress Notes (Signed)
Orthopedic Tech Progress Note Patient Details:  [name] [name] 06-Mar-1932 [id number]  Ortho Devices Type of Ortho Device: Ace wrap, Volar splint, Arm sling Ortho Device/Splint Interventions: Application   Post Interventions Patient Tolerated: Well Instructions Provided: Care of device   [name] 11/09/2018, 12:29 PM

## 2018-11-09 NOTE — ED Triage Notes (Addendum)
Pt woke up at 5am and when walking she caught her foot on a rug and fell onto her right out stretched wrist. Pt has swelling to right wrist- radial pulses and sensation intact. NO LOC

## 2018-11-11 ENCOUNTER — Ambulatory Visit (INDEPENDENT_AMBULATORY_CARE_PROVIDER_SITE_OTHER): Payer: Medicare Other | Admitting: Internal Medicine

## 2018-11-11 DIAGNOSIS — I10 Essential (primary) hypertension: Secondary | ICD-10-CM

## 2018-11-11 DIAGNOSIS — Z79899 Other long term (current) drug therapy: Secondary | ICD-10-CM

## 2018-11-11 DIAGNOSIS — I480 Paroxysmal atrial fibrillation: Secondary | ICD-10-CM | POA: Diagnosis not present

## 2018-11-11 LAB — PULMONARY FUNCTION TEST
DL/VA % pred: 57 %
DL/VA: 2.95 ml/min/mmHg/L
DLCO unc % pred: 38 %
DLCO unc: 10.91 ml/min/mmHg
FEF 25-75 Post: 1.23 L/s
FEF 25-75 Pre: 1.05 L/s
FEF2575-%Change-Post: 16 %
FEF2575-%Pred-Post: 89 %
FEF2575-%Pred-Pre: 76 %
FEV1-%Change-Post: 3 %
FEV1-%Pred-Post: 98 %
FEV1-%Pred-Pre: 95 %
FEV1-Post: 2.06 L
FEV1-Pre: 1.99 L
FEV1FVC-%Change-Post: 3 %
FEV1FVC-%Pred-Pre: 91 %
FEV6-%Change-Post: 1 %
FEV6-%Pred-Post: 111 %
FEV6-%Pred-Pre: 109 %
FEV6-Post: 2.95 L
FEV6-Pre: 2.91 L
FEV6FVC-%Change-Post: 1 %
FEV6FVC-%Pred-Post: 105 %
FEV6FVC-%Pred-Pre: 103 %
FVC-%Change-Post: 0 %
FVC-%Pred-Post: 105 %
FVC-%Pred-Pre: 105 %
FVC-Post: 2.97 L
FVC-Pre: 2.98 L
Post FEV1/FVC ratio: 69 %
Post FEV6/FVC ratio: 99 %
Pre FEV1/FVC ratio: 67 %
Pre FEV6/FVC Ratio: 98 %
RV % pred: 55 %
RV: 1.46 L
TLC % pred: 80 %
TLC: 4.45 L

## 2018-11-11 NOTE — Progress Notes (Signed)
PFT done today. 

## 2018-11-12 ENCOUNTER — Telehealth: Payer: Self-pay

## 2018-11-12 ENCOUNTER — Other Ambulatory Visit: Payer: Self-pay

## 2018-11-12 DIAGNOSIS — I480 Paroxysmal atrial fibrillation: Secondary | ICD-10-CM

## 2018-11-12 DIAGNOSIS — S52691A Other fracture of lower end of right ulna, initial encounter for closed fracture: Secondary | ICD-10-CM | POA: Diagnosis not present

## 2018-11-12 DIAGNOSIS — S52551A Other extraarticular fracture of lower end of right radius, initial encounter for closed fracture: Secondary | ICD-10-CM | POA: Diagnosis not present

## 2018-11-12 DIAGNOSIS — Z79899 Other long term (current) drug therapy: Secondary | ICD-10-CM

## 2018-11-12 NOTE — Telephone Encounter (Signed)
Pt advised PFT results and will hold Amiodarone... will call and let is know if any problems with her palpitations.. she reports that she was doing well lately on the Amio but will continue to use her Diltiazem 30mg  prn. Pt fell and broke her wrist over the weekend and seeing Dr. Burney Gauze today for possible surgery.Marland Kitchen she will have them fax [place] a  request for clearance/ Eliquis.Marland Kitchen if he needs cardiac input.

## 2018-11-12 NOTE — Telephone Encounter (Signed)
-----   Message from Josue Hector, MD sent at 11/12/2018  7:07 AM EST ----- Stop amiodarone diffusion capacity abnormal f/u PFT;s with [facility]CO in 6 months

## 2018-11-14 ENCOUNTER — Encounter (HOSPITAL_COMMUNITY): Payer: Self-pay | Admitting: *Deleted

## 2018-11-14 ENCOUNTER — Telehealth: Payer: Self-pay | Admitting: *Deleted

## 2018-11-14 ENCOUNTER — Other Ambulatory Visit: Payer: Self-pay

## 2018-11-14 ENCOUNTER — Other Ambulatory Visit: Payer: Self-pay | Admitting: Orthopedic Surgery

## 2018-11-14 ENCOUNTER — Telehealth: Payer: Self-pay | Admitting: Cardiovascular Disease

## 2018-11-14 NOTE — Telephone Encounter (Signed)
New message     Pt c/o medication issue:  1. Name of Medication: Amiodarone   2. How are you currently taking this medication (dosage and times per day)?   3. Are you having a reaction (difficulty breathing--STAT)? no  4. What is your medication issue? Pt is having sx for broken wrist tomorrow at 2:45 and is in afib. Pt wants to know if she can take Amiodarone.

## 2018-11-14 NOTE — Progress Notes (Signed)
I called pt back this evening to see if she was able to talk to anyone at Dr. Kyla Balzarine office. She states yes, that a "man" called her back and that Dr. Johnsie Cancel wanted her to have an EKG done. She is to see Dr. Johnsie Cancel at 9:00 AM Friday. She states she took her evening dose of Diltiazem and states she is no longer having the palpitations.

## 2018-11-14 NOTE — Telephone Encounter (Signed)
   Primary Cardiologist: Virl Axe, MD  Chart reviewed as part of pre-operative protocol coverage. Given past medical history and time since last visit, based on ACC/AHA guidelines, [name] [name] would be at acceptable risk for the planned procedure without further cardiovascular testing.   Per pharmacy, Pt takes Eliquis for afib with CHADS2VASc score of 6 (age x2, sex, HTN, TIA), also with hx of DVT. Renal function is normal. Ok to hold Eliquis for 24 hours prior to procedure tomorrow. Resume as soon as safely possible due to elevated cardiac risk.  I will route this recommendation to the requesting party via Epic fax function and remove from pre-op pool.  Please call with questions.  Lyda Jester, PA-C 11/14/2018, 3:02 PM

## 2018-11-14 NOTE — Telephone Encounter (Signed)
   [facility] Pre-operative Risk Assessment    Request for surgical clearance:  1. What type of surgery is being performed? OPEN REDUCTION INTERNAL FIXATION OF RIGHT DISTAL RADIUS, CLOSED REDUCTION RIGHT DISTAL ULNA   2. When is this surgery scheduled? 11/15/18    3. What type of clearance is required (medical clearance vs. Pharmacy clearance to hold med vs. Both)? BOTH  4. Are there any medications that need to be held prior to surgery and how long?ELIQUIS   5. Practice name and name of physician performing surgery? [facility]   6. What is your office phone number [phone]    7.   What is your office fax number       [phone]  8.   Anesthesia type (None, local, MAC, general) ? GENERAL AND [name] [name] 11/14/2018, 11:56 AM  _________________________________________________________________   (provider comments below)

## 2018-11-14 NOTE — Telephone Encounter (Signed)
Pt takes Eliquis for afib with CHADS2VASc score of 6 (age x2, sex, HTN, TIA), also with hx of DVT. Renal function is normal. Ok to hold Eliquis for 24 hours prior to procedure tomorrow. Resume as soon as safely possible due to elevated cardiac risk.

## 2018-11-14 NOTE — Progress Notes (Signed)
Spoke with [name] [name] for pre-op call. [name] [name] has hx of A-fib. Dr. Johnsie Cancel is her cardiologist and she states he stopped her Amiodarone 2 days ago. She states she has gone into A-fib this afternoon. She states she has called Dr. Kyla Balzarine office to ask if she can restart the Amiodarone since she is having surgery tomorrow. She asked me if she could start it and I told her those instructions would have to come from Dr. Kyla Balzarine office. I did instruct her that if Dr. Johnsie Cancel restarts the Amiodarone, she is to take it tomorrow prior to surgery. [name] [name] has a pacemaker also. I spoke with Jeneen Rinks, [place] for Anesthesiology. He asked that I call Dr. Burney Gauze and make sure that he is aware of [name] [name] being in A-fib. I called his office, spoke with his scheduler, Elmo Putt and she states Dr. Burney Gauze has been made aware and that she was the one to tell [name] [name] to call Dr. Kyla Balzarine office.

## 2018-11-14 NOTE — Telephone Encounter (Signed)
Spoke with Dr. Johnsie Cancel, he advised the patient come in for EKG tomorrow to determine her rhythm before the surgery. She expressed understanding and is schedule at 9 am tomorrow.

## 2018-11-14 NOTE — Telephone Encounter (Signed)
Routing to PharmD for [employer].

## 2018-11-15 ENCOUNTER — Ambulatory Visit (HOSPITAL_COMMUNITY): Payer: Medicare Other | Admitting: Physician Assistant

## 2018-11-15 ENCOUNTER — Encounter (HOSPITAL_COMMUNITY): Payer: Self-pay

## 2018-11-15 ENCOUNTER — Ambulatory Visit (INDEPENDENT_AMBULATORY_CARE_PROVIDER_SITE_OTHER): Payer: Medicare Other

## 2018-11-15 ENCOUNTER — Ambulatory Visit (HOSPITAL_COMMUNITY)
Admission: RE | Admit: 2018-11-15 | Discharge: 2018-11-15 | Disposition: A | Discharge status: Home / Self Care | Payer: Medicare Other | Attending: Orthopedic Surgery | Admitting: Orthopedic Surgery

## 2018-11-15 ENCOUNTER — Encounter (HOSPITAL_COMMUNITY)
Admission: RE | Disposition: A | Discharge status: Home / Self Care | Payer: Self-pay | Source: Home / Self Care | Attending: Orthopedic Surgery

## 2018-11-15 ENCOUNTER — Other Ambulatory Visit: Payer: Self-pay

## 2018-11-15 VITALS — HR 67 | Ht 66.0 in | Wt 161.2 lb

## 2018-11-15 DIAGNOSIS — Z79899 Other long term (current) drug therapy: Secondary | ICD-10-CM | POA: Diagnosis not present

## 2018-11-15 DIAGNOSIS — I10 Essential (primary) hypertension: Secondary | ICD-10-CM | POA: Insufficient documentation

## 2018-11-15 DIAGNOSIS — K9041 Non-celiac gluten sensitivity: Secondary | ICD-10-CM | POA: Diagnosis not present

## 2018-11-15 DIAGNOSIS — S52551A Other extraarticular fracture of lower end of right radius, initial encounter for closed fracture: Secondary | ICD-10-CM | POA: Insufficient documentation

## 2018-11-15 DIAGNOSIS — Z8673 Personal history of transient ischemic attack (TIA), and cerebral infarction without residual deficits: Secondary | ICD-10-CM | POA: Insufficient documentation

## 2018-11-15 DIAGNOSIS — S52601A Unspecified fracture of lower end of right ulna, initial encounter for closed fracture: Secondary | ICD-10-CM | POA: Diagnosis not present

## 2018-11-15 DIAGNOSIS — E739 Lactose intolerance, unspecified: Secondary | ICD-10-CM | POA: Insufficient documentation

## 2018-11-15 DIAGNOSIS — F329 Major depressive disorder, single episode, unspecified: Secondary | ICD-10-CM | POA: Insufficient documentation

## 2018-11-15 DIAGNOSIS — I480 Paroxysmal atrial fibrillation: Secondary | ICD-10-CM | POA: Diagnosis not present

## 2018-11-15 DIAGNOSIS — Z8249 Family history of ischemic heart disease and other diseases of the circulatory system: Secondary | ICD-10-CM | POA: Diagnosis not present

## 2018-11-15 DIAGNOSIS — Z888 Allergy status to other drugs, medicaments and biological substances status: Secondary | ICD-10-CM | POA: Diagnosis not present

## 2018-11-15 DIAGNOSIS — Z86718 Personal history of other venous thrombosis and embolism: Secondary | ICD-10-CM | POA: Insufficient documentation

## 2018-11-15 DIAGNOSIS — Z87891 Personal history of nicotine dependence: Secondary | ICD-10-CM | POA: Insufficient documentation

## 2018-11-15 DIAGNOSIS — W19XXXA Unspecified fall, initial encounter: Secondary | ICD-10-CM | POA: Diagnosis not present

## 2018-11-15 DIAGNOSIS — M1990 Unspecified osteoarthritis, unspecified site: Secondary | ICD-10-CM | POA: Insufficient documentation

## 2018-11-15 DIAGNOSIS — Z7901 Long term (current) use of anticoagulants: Secondary | ICD-10-CM | POA: Insufficient documentation

## 2018-11-15 DIAGNOSIS — Z791 Long term (current) use of non-steroidal anti-inflammatories (NSAID): Secondary | ICD-10-CM | POA: Insufficient documentation

## 2018-11-15 DIAGNOSIS — Z950 Presence of cardiac pacemaker: Secondary | ICD-10-CM | POA: Insufficient documentation

## 2018-11-15 DIAGNOSIS — Z823 Family history of stroke: Secondary | ICD-10-CM | POA: Insufficient documentation

## 2018-11-15 DIAGNOSIS — Z809 Family history of malignant neoplasm, unspecified: Secondary | ICD-10-CM | POA: Insufficient documentation

## 2018-11-15 DIAGNOSIS — I340 Nonrheumatic mitral (valve) insufficiency: Secondary | ICD-10-CM | POA: Diagnosis not present

## 2018-11-15 DIAGNOSIS — S52501A Unspecified fracture of the lower end of right radius, initial encounter for closed fracture: Secondary | ICD-10-CM | POA: Diagnosis not present

## 2018-11-15 HISTORY — PX: CLOSED REDUCTION ULNAR SHAFT: SHX5775

## 2018-11-15 HISTORY — PX: OPEN REDUCTION INTERNAL FIXATION (ORIF) DISTAL RADIAL FRACTURE: SHX5989

## 2018-11-15 HISTORY — DX: Presence of cardiac pacemaker: Z95.0

## 2018-11-15 HISTORY — DX: Pneumonia, unspecified organism: J18.9

## 2018-11-15 HISTORY — DX: Unspecified osteoarthritis, unspecified site: M19.90

## 2018-11-15 LAB — BASIC METABOLIC PANEL
Anion gap: 15 (ref 5–15)
BUN: 23 mg/dL (ref 8–23)
CALCIUM: 9.1 mg/dL (ref 8.9–10.3)
CO2: 18 mmol/L — ABNORMAL LOW (ref 22–32)
Chloride: 109 mmol/L (ref 98–111)
Creatinine, Ser: 1.03 mg/dL — ABNORMAL HIGH (ref 0.44–1.00)
GFR calc Af Amer: 58 mL/min — ABNORMAL LOW (ref >60)
GFR calc non Af Amer: 50 mL/min — ABNORMAL LOW (ref >60)
Glucose, Bld: 114 mg/dL — ABNORMAL HIGH (ref 70–99)
Potassium: 3.7 mmol/L (ref 3.5–5.1)
SODIUM: 142 mmol/L (ref 135–145)

## 2018-11-15 LAB — CBC
HCT: 40.4 % (ref 36.0–46.0)
Hemoglobin: 12.9 g/dL (ref 12.0–15.0)
MCH: 31.9 pg (ref 26.0–34.0)
MCHC: 31.9 g/dL (ref 30.0–36.0)
MCV: 99.8 fL (ref 80.0–100.0)
Platelets: 155 10*3/uL (ref 150–400)
RBC: 4.05 MIL/uL (ref 3.87–5.11)
RDW: 15.8 % — ABNORMAL HIGH (ref 11.5–15.5)
WBC: 6.8 10*3/uL (ref 4.0–10.5)
nRBC: 0 % (ref 0.0–0.2)

## 2018-11-15 LAB — PROTIME-INR
INR: 1.39
Prothrombin Time: 16.9 seconds — ABNORMAL HIGH (ref 11.4–15.2)

## 2018-11-15 SURGERY — OPEN REDUCTION INTERNAL FIXATION (ORIF) DISTAL RADIUS FRACTURE
Anesthesia: General | Site: Wrist | Laterality: Right

## 2018-11-15 MED ORDER — ONDANSETRON HCL 4 MG/2ML IJ SOLN
INTRAMUSCULAR | Status: AC
  Filled 2018-11-15: qty 2

## 2018-11-15 MED ORDER — POVIDONE-IODINE 10 % EX SWAB: 2.0000 "application " | Freq: Once | CUTANEOUS | Status: DC

## 2018-11-15 MED ORDER — BUPIVACAINE HCL (PF) 0.25 % IJ SOLN
INTRAMUSCULAR | Status: AC
  Filled 2018-11-15: qty 30

## 2018-11-15 MED ORDER — MIDAZOLAM HCL 2 MG/2ML IJ SOLN
INTRAMUSCULAR | Status: AC
  Filled 2018-11-15: qty 2

## 2018-11-15 MED ORDER — LIDOCAINE 2% (20 MG/ML) 5 ML SYRINGE
INTRAMUSCULAR | Status: AC
  Filled 2018-11-15: qty 5

## 2018-11-15 MED ORDER — HYDROMORPHONE HCL 1 MG/ML IJ SOLN
INTRAMUSCULAR | Status: AC
  Administered 2018-11-15: 0.5 mg via INTRAVENOUS
  Filled 2018-11-15: qty 1

## 2018-11-15 MED ORDER — ONDANSETRON HCL 4 MG/2ML IJ SOLN
INTRAMUSCULAR | Status: DC | PRN
  Administered 2018-11-15: 4 mg via INTRAVENOUS

## 2018-11-15 MED ORDER — CEFAZOLIN SODIUM-DEXTROSE 2-4 GM/100ML-% IV SOLN
2.0000 g | INTRAVENOUS | Status: AC
  Administered 2018-11-15: 2 g via INTRAVENOUS
  Filled 2018-11-15: qty 100

## 2018-11-15 MED ORDER — 0.9 % SODIUM CHLORIDE (POUR BTL) OPTIME
TOPICAL | Status: DC | PRN
  Administered 2018-11-15: 1000 mL

## 2018-11-15 MED ORDER — FENTANYL CITRATE (PF) 100 MCG/2ML IJ SOLN
INTRAMUSCULAR | Status: AC
  Filled 2018-11-15: qty 2

## 2018-11-15 MED ORDER — LACTATED RINGERS IV SOLN
INTRAVENOUS | Status: DC
  Administered 2018-11-15: 15:00:00 via INTRAVENOUS

## 2018-11-15 MED ORDER — DEXAMETHASONE SODIUM PHOSPHATE 10 MG/ML IJ SOLN
INTRAMUSCULAR | Status: AC
  Filled 2018-11-15: qty 1

## 2018-11-15 MED ORDER — HYDROMORPHONE HCL 1 MG/ML IJ SOLN
0.2500 mg | INTRAMUSCULAR | Status: DC | PRN
  Administered 2018-11-15 (×4): 0.5 mg via INTRAVENOUS

## 2018-11-15 MED ORDER — CHLORHEXIDINE GLUCONATE 4 % EX LIQD: 60.0000 mL | Freq: Once | CUTANEOUS | Status: DC

## 2018-11-15 MED ORDER — FENTANYL CITRATE (PF) 250 MCG/5ML IJ SOLN
INTRAMUSCULAR | Status: AC
  Filled 2018-11-15: qty 5

## 2018-11-15 MED ORDER — LIDOCAINE 2% (20 MG/ML) 5 ML SYRINGE
INTRAMUSCULAR | Status: DC | PRN
  Administered 2018-11-15: 100 mg via INTRAVENOUS

## 2018-11-15 MED ORDER — FENTANYL CITRATE (PF) 250 MCG/5ML IJ SOLN
INTRAMUSCULAR | Status: DC | PRN
  Administered 2018-11-15 (×2): 50 ug via INTRAVENOUS

## 2018-11-15 MED ORDER — PROMETHAZINE HCL 25 MG/ML IJ SOLN: 6.2500 mg | INTRAMUSCULAR | Status: DC | PRN

## 2018-11-15 MED ORDER — DEXAMETHASONE SODIUM PHOSPHATE 10 MG/ML IJ SOLN
INTRAMUSCULAR | Status: DC | PRN
  Administered 2018-11-15: 10 mg via INTRAVENOUS

## 2018-11-15 MED ORDER — PROPOFOL 10 MG/ML IV BOLUS
INTRAVENOUS | Status: DC | PRN
  Administered 2018-11-15: 170 mg via INTRAVENOUS

## 2018-11-15 SURGICAL SUPPLY — 52 items
BANDAGE ACE 3X5.8 VEL STRL LF (GAUZE/BANDAGES/DRESSINGS) ×3 IMPLANT
BANDAGE ACE 4X5 VEL STRL LF (GAUZE/BANDAGES/DRESSINGS) ×3 IMPLANT
BIT DRILL 2 FAST STEP (BIT) ×2 IMPLANT
BIT DRILL 2.5X4 QC (BIT) ×2 IMPLANT
BNDG CMPR 9X4 STRL LF SNTH (GAUZE/BANDAGES/DRESSINGS) ×1
BNDG ESMARK 4X9 LF (GAUZE/BANDAGES/DRESSINGS) ×3 IMPLANT
BNDG GAUZE ELAST 4 BULKY (GAUZE/BANDAGES/DRESSINGS) ×6 IMPLANT
CORDS BIPOLAR (ELECTRODE) IMPLANT
COVER SURGICAL LIGHT HANDLE (MISCELLANEOUS) ×3 IMPLANT
COVER WAND RF STERILE (DRAPES) ×3 IMPLANT
CUFF TOURNIQUET SINGLE 18IN (TOURNIQUET CUFF) ×3 IMPLANT
CUFF TOURNIQUET SINGLE 24IN (TOURNIQUET CUFF) IMPLANT
DRAPE C-ARM MINI 42X72 WSTRAPS (DRAPES) ×3 IMPLANT
DRAPE OEC MINIVIEW 54X84 (DRAPES) IMPLANT
DRAPE SURG 17X23 STRL (DRAPES) ×3 IMPLANT
DURAPREP 26ML APPLICATOR (WOUND CARE) ×3 IMPLANT
GAUZE SPONGE 4X4 12PLY STRL (GAUZE/BANDAGES/DRESSINGS) ×3 IMPLANT
GAUZE XEROFORM 1X8 LF (GAUZE/BANDAGES/DRESSINGS) ×3 IMPLANT
GLOVE SURG SYN 8.0 (GLOVE) ×3 IMPLANT
GLOVE SURG SYN 8.0 PF PI (GLOVE) ×1 IMPLANT
GOWN STRL REUS W/ TWL LRG LVL3 (GOWN DISPOSABLE) ×1 IMPLANT
GOWN STRL REUS W/ TWL XL LVL3 (GOWN DISPOSABLE) ×1 IMPLANT
GOWN STRL REUS W/TWL LRG LVL3 (GOWN DISPOSABLE) ×3
GOWN STRL REUS W/TWL XL LVL3 (GOWN DISPOSABLE) ×3
KIT BASIN OR (CUSTOM PROCEDURE TRAY) ×3 IMPLANT
KIT TURNOVER KIT B (KITS) ×3 IMPLANT
MANIFOLD NEPTUNE II (INSTRUMENTS) ×3 IMPLANT
NDL HYPO 25GX1X1/2 BEV (NEEDLE) IMPLANT
NEEDLE HYPO 25GX1X1/2 BEV (NEEDLE) IMPLANT
NS IRRIG 1000ML POUR BTL (IV SOLUTION) ×3 IMPLANT
PACK ORTHO EXTREMITY (CUSTOM PROCEDURE TRAY) ×3 IMPLANT
PAD ARMBOARD 7.5X6 YLW CONV (MISCELLANEOUS) ×3 IMPLANT
PAD CAST 4YDX4 CTTN HI CHSV (CAST SUPPLIES) ×1 IMPLANT
PADDING CAST COTTON 4X4 STRL (CAST SUPPLIES) ×3
PEG SUBCHONDRAL SMOOTH 2.0X18 (Peg) ×2 IMPLANT
PEG SUBCHONDRAL SMOOTH 2.0X20 (Peg) ×6 IMPLANT
PEG SUBCHONDRAL SMOOTH 2.0X22 (Peg) ×6 IMPLANT
PENCIL BUTTON HOLSTER BLD 10FT (ELECTRODE) IMPLANT
PLATE STAN 24.4X59.5 RT (Plate) ×2 IMPLANT
SCREW BN 12X3.5XNS CORT TI (Screw) IMPLANT
SCREW CORT 3.5X10 LNG (Screw) ×2 IMPLANT
SCREW CORT 3.5X12 (Screw) ×6 IMPLANT
SPONGE LAP 4X18 RFD (DISPOSABLE) IMPLANT
SUT PROLENE 3 0 PS 2 (SUTURE) ×2 IMPLANT
SUT VIC AB 3-0 FS2 27 (SUTURE) ×2 IMPLANT
SUT VICRYL 4-0 PS2 18IN ABS (SUTURE) IMPLANT
SYR CONTROL 10ML LL (SYRINGE) IMPLANT
TOWEL OR 17X24 6PK STRL BLUE (TOWEL DISPOSABLE) ×3 IMPLANT
TOWEL OR 17X26 10 PK STRL BLUE (TOWEL DISPOSABLE) ×3 IMPLANT
TUBE CONNECTING 12'X1/4 (SUCTIONS) ×1
TUBE CONNECTING 12X1/4 (SUCTIONS) ×2 IMPLANT
UNDERPAD 30X30 (UNDERPADS AND DIAPERS) ×3 IMPLANT

## 2018-11-15 NOTE — Op Note (Signed)
Please see dictated report [phone]

## 2018-11-15 NOTE — Transfer of Care (Signed)
Immediate Anesthesia Transfer of Care Note  Patient: [name] [name]  Procedure(s) Performed: OPEN REDUCTION INTERNAL FIXATION (ORIF) RIGHT DISTAL RADIAL FRACTURE (Right Wrist) CLOSED REDUCTION RIGHT DISTAL ULNAR FRACTURE (Right Wrist)  Patient Location: PACU  Anesthesia Type:General  Level of Consciousness: drowsy  Airway & Oxygen Therapy: Patient Spontanous Breathing and Patient connected to nasal cannula oxygen  Post-op Assessment: Report given to RN and Post -op Vital signs reviewed and stable  Post vital signs: Reviewed and stable  Last Vitals:  Vitals Value Taken Time  BP 152/84 11/15/2018  4:45 PM  Temp 36.4 C 11/15/2018  4:45 PM  Pulse 62 11/15/2018  4:50 PM  Resp 10 11/15/2018  4:50 PM  SpO2 96 % 11/15/2018  4:50 PM  Vitals shown include unvalidated device data.  Last Pain:  Vitals:   11/15/18 1645  TempSrc:   PainSc: Asleep         Complications: No apparent anesthesia complications

## 2018-11-15 NOTE — Anesthesia Preprocedure Evaluation (Addendum)
Anesthesia Evaluation  Patient identified by MRN, date of birth, ID band Patient awake    Reviewed: Allergy & Precautions, NPO status , Patient's Chart, lab work & pertinent test results  Airway Mallampati: II  TM Distance: >3 FB Neck ROM: Full    Dental  (+) Dental Advisory Given   Pulmonary former smoker,    breath sounds clear to auscultation       Cardiovascular hypertension, Pt. on medications + dysrhythmias + pacemaker  Rhythm:Regular Rate:Normal     Neuro/Psych TIA   GI/Hepatic negative GI ROS, Neg liver ROS,   Endo/Other  negative endocrine ROS  Renal/GU negative Renal ROS     Musculoskeletal  (+) Arthritis ,   Abdominal   Peds  Hematology negative hematology ROS (+)   Anesthesia Other Findings   Reproductive/Obstetrics                             Anesthesia Physical Anesthesia Plan  ASA: II  Anesthesia Plan: General   Post-op Pain Management:    Induction: Intravenous  PONV Risk Score and Plan: 3 and Ondansetron, Dexamethasone and Treatment may vary due to age or medical condition  Airway Management Planned: LMA  Additional Equipment:   Intra-op Plan:   Post-operative Plan: Extubation in OR  Informed Consent: I have reviewed the patients History and Physical, chart, labs and discussed the procedure including the risks, benefits and alternatives for the proposed anesthesia with the patient or authorized representative who has indicated his/her understanding and acceptance.   Dental advisory given  Plan Discussed with: CRNA  Anesthesia Plan Comments: (Pt refused nerve block for post-op pain relief. 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  Deatra Canter, MD)       Anesthesia Quick Evaluation

## 2018-11-15 NOTE — Anesthesia Procedure Notes (Signed)
Procedure Name: LMA Insertion Date/Time: 11/15/2018 3:38 PM Performed by: Valda Favia, CRNA Pre-anesthesia Checklist: Patient identified, Emergency Drugs available, Suction available and Patient being monitored Patient Re-evaluated:Patient Re-evaluated prior to induction Oxygen Delivery Method: Circle System Utilized Preoxygenation: Pre-oxygenation with 100% oxygen Induction Type: IV induction Ventilation: Mask ventilation without difficulty LMA: LMA inserted LMA Size: 4.0 Number of attempts: 1 Airway Equipment and Method: Bite block Placement Confirmation: positive ETCO2 Tube secured with: Tape Dental Injury: Teeth and Oropharynx as per pre-operative assessment

## 2018-11-15 NOTE — H&P (Signed)
[name] [name] is an 84 y.o. female.   Chief Complaint: Right forearm pain, swelling, and deformity HPI: Patient is a very pleasant 84 year old right-hand-dominant female status post a fall onto an outstretched upper extremity on the right with displaced distal radius and distal ulna fractures with pain and swelling.  Past Medical History:  Diagnosis Date  . Arthritis   . CVA    Lacunar and cerebellar infarct  . Depression   . DVT (deep venous thrombosis) ([place])    Details not available in the chart  . Edema   . Mitral valve regurgitation    a. echo 2009 mild to moderate MR  . Paroxysmal atrial fibrillation (HCC)   . Pneumonia    as a child  . Presence of permanent cardiac pacemaker   . Sinoatrial node dysfunction (HCC)   . Unspecified essential hypertension     Past Surgical History:  Procedure Laterality Date  . PACEMAKER INSERTION  09-14-2005   MDT EnRhythm dual chamber pacemaker implanted by Dr Lovena Le for SND  . PPM GENERATOR CHANGEOUT N/A 05/07/2017   Procedure: PPM Generator Changeout;  Surgeon: Deboraha Sprang, MD;  Location: [facility];  Service: Cardiovascular;  Laterality: N/A;  . TONSILLECTOMY      Family History  Problem Relation Age of Onset  . Cancer Mother   . Mitral valve prolapse Mother   . Diabetes Father   . Hypertension Father   . Coronary artery disease Sister   . Stroke Brother   . Coronary artery disease Sister    Social History:  reports that she quit smoking about 34 years ago. She has never used smokeless tobacco. She reports current alcohol use of about 14.0 standard drinks of alcohol per week. She reports that she does not use drugs.  Allergies:  Allergies  Allergen Reactions  . Gluten Meal Diarrhea  . Itraconazole Diarrhea and Itching  . Lactose Intolerance (Gi) Diarrhea and Nausea Only  . Statins Other (See Comments)    Myalgias    Medications Prior to Admission  Medication Sig Dispense Refill  . allopurinol (ZYLOPRIM)  100 MG tablet Take 100 mg by mouth daily.    . Alpha-D-Galactosidase (BEANO) TABS Take 1 tablet by mouth 3 (three) times daily before meals.    Marland Kitchen Apoaequorin (PREVAGEN) 10 MG CAPS Take 10 mg by mouth daily.     . Cholecalciferol (VITAMIN D3) 2000 UNITS TABS Take 2,000 Units by mouth daily.     Marland Kitchen desonide (DESOWEN) 0.05 % ointment Apply 1 application topically 2 (two) times daily as needed (for irritation).     Marland Kitchen diltiazem (CARDIZEM CD) 120 MG 24 hr capsule Take 1 capsule (120 mg total) by mouth daily. 90 capsule 3  . diltiazem (CARDIZEM) 30 MG tablet TAKE 1 TABLET ONCE DAILY AS NEEDED FOR PALPATATION. (Patient taking differently: Take 30 mg by mouth daily as needed (afib). ) 45 tablet 0  . ELIQUIS 5 MG TABS tablet TAKE 1 TABLET BY MOUTH TWICE DAILY. (Patient taking differently: Take 5 mg by mouth 2 (two) times daily. ) 60 tablet 8  . ibuprofen (ADVIL,MOTRIN) 400 MG tablet Take 1 tablet (400 mg total) by mouth every 6 (six) hours as needed for fever or mild pain. 30 tablet 0  . Lactase (LACTAID PO) Take 1 tablet by mouth 3 (three) times daily before meals.    Marland Kitchen loratadine (CLARITIN) 10 MG tablet Take 1 tablet (10 mg total) by mouth daily. For allergies, congestion (Patient taking differently: Take 10  mg by mouth daily. ) 30 tablet 3  . LORazepam (ATIVAN) 0.5 MG tablet Take 0.5 mg by mouth daily as needed for anxiety or sleep.     . Magnesium Oxide 400 (240 Mg) MG TABS Take 1 tablet (400 mg total) by mouth daily. 90 tablet 3  . Polyvinyl Alcohol-Povidone (REFRESH OP) Place 1 drop into both eyes 2 (two) times daily as needed (for dry eyes).     . sodium chloride (OCEAN) 0.65 % SOLN nasal spray Place 1 spray into both nostrils as needed for congestion.    . traMADol (ULTRAM) 50 MG tablet Take 1 tablet (50 mg total) by mouth every 6 (six) hours as needed. (Patient taking differently: Take 50 mg by mouth every 6 (six) hours as needed for moderate pain. ) 12 tablet 0    Results for orders placed or  performed during the hospital encounter of 11/15/18 (from the past 48 hour(s))  Basic metabolic panel     Status: Abnormal   Collection Time: 11/15/18 12:39 PM  Result Value Ref Range   Sodium 142 135 - 145 mmol/L   Potassium 3.7 3.5 - 5.1 mmol/L   Chloride 109 98 - 111 mmol/L   CO2 18 (L) 22 - 32 mmol/L   Glucose, Bld 114 (H) 70 - 99 mg/dL   BUN 23 8 - 23 mg/dL   Creatinine, Ser 1.03 (H) 0.44 - 1.00 mg/dL   Calcium 9.1 8.9 - 10.3 mg/dL   GFR calc non Af Amer 50 (L) >60 mL/min   GFR calc Af Amer 58 (L) >60 mL/min   Anion gap 15 5 - 15    Comment: Performed at [facility], [place] [address] [place], [place] [address]  CBC     Status: Abnormal   Collection Time: 11/15/18 12:39 PM  Result Value Ref Range   WBC 6.8 4.0 - 10.5 K/uL   RBC 4.05 3.87 - 5.11 MIL/uL   Hemoglobin 12.9 12.0 - 15.0 g/dL   HCT 40.4 36.0 - 46.0 %   MCV 99.8 80.0 - 100.0 fL   MCH 31.9 26.0 - 34.0 pg   MCHC 31.9 30.0 - 36.0 g/dL   RDW 15.8 (H) 11.5 - 15.5 %   Platelets 155 150 - 400 K/uL   nRBC 0.0 0.0 - 0.2 %    Comment: Performed at [facility] [address], [place], [place] [address]  PT- INR Day of Surgery     Status: Abnormal   Collection Time: 11/15/18 12:39 PM  Result Value Ref Range   Prothrombin Time 16.9 (H) 11.4 - 15.2 seconds   INR 1.39     Comment: Performed at [facility], [place] [address] [place], [place] [address]   No results found.  Review of Systems  All other systems reviewed and are negative.   Blood pressure (!) 163/72, pulse 75, temperature 97.8 F (36.6 C), temperature source Oral, resp. rate 18, height 5\' 6"  (1.676 m), weight 73.1 kg, SpO2 98 %. Physical Exam  Constitutional: She is oriented to person, place, and time. She appears well-developed and well-nourished.  HENT:  Head: Normocephalic and atraumatic.  Neck: Normal range of motion.  Cardiovascular: Normal rate.  Respiratory: Effort normal.  Musculoskeletal:     Right forearm: She exhibits  tenderness, bony tenderness and deformity.     Comments: Right distal third both bone forearm fracture with pain, swelling, and deformity  Neurological: She is alert and oriented to person, place, and  time.  Skin: Skin is warm.  Psychiatric: She has a normal mood and affect. Her behavior is normal. Judgment and thought content normal.     Assessment/Plan 84 year old female with displaced distal third forearm fracture right side.  Have discussed the role of internal fixation of these fractures as an outpatient to allow healing and a functional position.  She understands the risks and benefits and wishes to proceed  Schuyler Amor, MD 11/15/2018, 2:53 PM

## 2018-11-15 NOTE — Op Note (Signed)
NAME: [name] [name], BOOT MEDICAL RECORD [id number] ACCOUNT [id number] DATE OF BIRTH:August 27, 1932 FACILITY: [facility] LOCATION: [facility] PHYSICIAN:Tyshauna Finkbiner A. Burney Gauze, MD  OPERATIVE REPORT  DATE OF PROCEDURE:  11/15/2018  PREOPERATIVE DIAGNOSIS:  Displaced distal third forearm fracture, right side.  POSTOPERATIVE DIAGNOSIS:  Displaced distal third forearm fracture, right side.  PROCEDURE:  Open treatment fracture, distal end of the radius, right side and closed reduction of ulna fracture.  SURGEON:  Charlotte Crumb, MD  ASSISTANT:  Dasnoit, PA.  ANESTHESIA:  General.  COMPLICATIONS:  No complications.  DRAINS:  No drains.  DESCRIPTION OF PROCEDURE:  The patient was taken to the operating suite after the induction of adequate general anesthetic.  The right upper extremity was prepped and draped in the usual sterile fashion.  An Esmarch was used to exsanguinate the limb and  the tourniquet was inflated to 250 mmHg.  At this point in time, incision was made on the palmar aspect of the distal forearm and wrist area on the right side.  The skin was incised and dissection was carried down to the interval between the flexor carpi  radialis tendon and the radial artery.  We dissected down to the level of the pronator quadratus and stripped this off the volar radius revealing a Galeazzi type fracture of the distal radius.  The fracture was reduced with longitudinal traction,  flexion and slight ulnar deviation.  We then placed a standard DVR plate palmarly and under direct and fluoroscopic guidance, placed 3 cortical screws proximally followed by smooth pegs distally x7.  Fluoroscopic imaging revealed adequate reduction on  both the AP, lateral and oblique view.  We then gently manipulated the distal ulna into a near anatomic reduction.  The wound was thoroughly irrigated and closed in layers of 2-0 undyed Vicryl and a 3-0 Prolene subcuticular stitch on the skin.   Steri-Strips, 4 x 4 fluffs  and a sugar tong splint was applied.  The patient tolerated these procedures well and went to recovery room in stable fashion.  TN/NUANCE  D:11/15/2018 T:11/15/2018 JOB:[id number]

## 2018-11-15 NOTE — Progress Notes (Signed)
Patient with [employer] pacemaker- no orders from device clinic. Called Dr. Ola Spurr to find out if he wanted emergency orders or to contact [employer]. Advised to contract [employer]. Spoke with Erlene Quan from [employer], advised procedure should not interfere with pacemaker but he will come and evaluate patient.

## 2018-11-15 NOTE — Progress Notes (Signed)
1.) Reason for visit: EKG, Afib  2.) Name of MD requesting visit: Dr. Johnsie Cancel  3.) H&P: Paroxysmal atrial fibrillation  4.) ROS related to problem: Patient feels good and does not have concerns.  5.) Assessment and plan per MD: Spoke with Dr. Johnsie Cancel, he stated the patient is in NSR and she should continue her current therapy.

## 2018-11-18 ENCOUNTER — Encounter (HOSPITAL_COMMUNITY): Payer: Self-pay | Admitting: Orthopedic Surgery

## 2018-11-18 NOTE — Anesthesia Postprocedure Evaluation (Signed)
Anesthesia Post Note  Patient: Charity fundraiser  Procedure(s) Performed: OPEN REDUCTION INTERNAL FIXATION (ORIF) RIGHT DISTAL RADIAL FRACTURE (Right Wrist) CLOSED REDUCTION RIGHT DISTAL ULNAR FRACTURE (Right Wrist)     Patient location during evaluation: PACU Anesthesia Type: General Level of consciousness: awake and alert Pain management: pain level controlled Vital Signs Assessment: post-procedure vital signs reviewed and stable Respiratory status: spontaneous breathing, nonlabored ventilation, respiratory function stable and patient connected to nasal cannula oxygen Cardiovascular status: blood pressure returned to baseline and stable Postop Assessment: no apparent nausea or vomiting Anesthetic complications: no    Last Vitals:  Vitals:   11/15/18 1830 11/15/18 1835  BP: (!) 174/87 (!) 157/69  Pulse: 60 64  Resp: 14 15  Temp:  36.4 C  SpO2: 100% 100%    Last Pain:  Vitals:   11/15/18 1835  TempSrc:   PainSc: 0-No pain                 Tiajuana Amass

## 2018-11-19 DIAGNOSIS — S52551A Other extraarticular fracture of lower end of right radius, initial encounter for closed fracture: Secondary | ICD-10-CM | POA: Diagnosis not present

## 2018-11-19 DIAGNOSIS — S52691A Other fracture of lower end of right ulna, initial encounter for closed fracture: Secondary | ICD-10-CM | POA: Diagnosis not present

## 2018-12-02 DIAGNOSIS — Z961 Presence of intraocular lens: Secondary | ICD-10-CM | POA: Diagnosis not present

## 2018-12-02 DIAGNOSIS — H26493 Other secondary cataract, bilateral: Secondary | ICD-10-CM | POA: Diagnosis not present

## 2018-12-10 DIAGNOSIS — S52691A Other fracture of lower end of right ulna, initial encounter for closed fracture: Secondary | ICD-10-CM | POA: Diagnosis not present

## 2018-12-10 DIAGNOSIS — S52551A Other extraarticular fracture of lower end of right radius, initial encounter for closed fracture: Secondary | ICD-10-CM | POA: Diagnosis not present

## 2018-12-17 DIAGNOSIS — S52551A Other extraarticular fracture of lower end of right radius, initial encounter for closed fracture: Secondary | ICD-10-CM | POA: Diagnosis not present

## 2018-12-17 DIAGNOSIS — S52691A Other fracture of lower end of right ulna, initial encounter for closed fracture: Secondary | ICD-10-CM | POA: Diagnosis not present

## 2018-12-21 LAB — CUP PACEART REMOTE DEVICE CHECK
Battery Remaining Longevity: 159 mo
Battery Voltage: 3.04 V
Brady Statistic AP VP Percent: 0.09 %
Brady Statistic AS VP Percent: 0.01 %
Brady Statistic AS VS Percent: 5.51 %
Brady Statistic RA Percent Paced: 82.59 %
Brady Statistic RV Percent Paced: 0.58 %
Date Time Interrogation Session: 20191212054815
Implantable Lead Implant Date: 20061019
Implantable Lead Implant Date: 20061019
Implantable Lead Location: 753859
Implantable Lead Location: 753860
Implantable Lead Model: 5076
Implantable Pulse Generator Implant Date: 20180611
Lead Channel Impedance Value: 342 Ohm
Lead Channel Impedance Value: 399 Ohm
Lead Channel Impedance Value: 627 Ohm
Lead Channel Impedance Value: 703 Ohm
Lead Channel Pacing Threshold Amplitude: 0.5 V
Lead Channel Pacing Threshold Amplitude: 0.75 V
Lead Channel Pacing Threshold Pulse Width: 0.4 ms
Lead Channel Sensing Intrinsic Amplitude: 1.75 mV
Lead Channel Sensing Intrinsic Amplitude: 3.125 mV
Lead Channel Setting Pacing Amplitude: 1.5 V
Lead Channel Setting Pacing Amplitude: 2.5 V
Lead Channel Setting Pacing Pulse Width: 0.4 ms
Lead Channel Setting Sensing Sensitivity: 0.9 mV
MDC IDC MSMT LEADCHNL RA SENSING INTR AMPL: 1.75 mV
MDC IDC MSMT LEADCHNL RV PACING THRESHOLD PULSEWIDTH: 0.4 ms
MDC IDC MSMT LEADCHNL RV SENSING INTR AMPL: 3.125 mV
MDC IDC STAT BRADY AP VS PERCENT: 94.46 %

## 2018-12-24 DIAGNOSIS — M25631 Stiffness of right wrist, not elsewhere classified: Secondary | ICD-10-CM | POA: Diagnosis not present

## 2018-12-24 DIAGNOSIS — S52691D Other fracture of lower end of right ulna, subsequent encounter for closed fracture with routine healing: Secondary | ICD-10-CM | POA: Diagnosis not present

## 2018-12-24 DIAGNOSIS — S52551D Other extraarticular fracture of lower end of right radius, subsequent encounter for closed fracture with routine healing: Secondary | ICD-10-CM | POA: Diagnosis not present

## 2018-12-24 DIAGNOSIS — M7989 Other specified soft tissue disorders: Secondary | ICD-10-CM | POA: Diagnosis not present

## 2018-12-24 DIAGNOSIS — R29898 Other symptoms and signs involving the musculoskeletal system: Secondary | ICD-10-CM | POA: Diagnosis not present

## 2018-12-24 DIAGNOSIS — M25531 Pain in right wrist: Secondary | ICD-10-CM | POA: Diagnosis not present

## 2018-12-31 DIAGNOSIS — S52551A Other extraarticular fracture of lower end of right radius, initial encounter for closed fracture: Secondary | ICD-10-CM | POA: Diagnosis not present

## 2018-12-31 DIAGNOSIS — S52691A Other fracture of lower end of right ulna, initial encounter for closed fracture: Secondary | ICD-10-CM | POA: Diagnosis not present

## 2019-01-10 DIAGNOSIS — S52551D Other extraarticular fracture of lower end of right radius, subsequent encounter for closed fracture with routine healing: Secondary | ICD-10-CM | POA: Diagnosis not present

## 2019-01-10 DIAGNOSIS — R29898 Other symptoms and signs involving the musculoskeletal system: Secondary | ICD-10-CM | POA: Diagnosis not present

## 2019-01-10 DIAGNOSIS — M25631 Stiffness of right wrist, not elsewhere classified: Secondary | ICD-10-CM | POA: Diagnosis not present

## 2019-01-10 DIAGNOSIS — S52691D Other fracture of lower end of right ulna, subsequent encounter for closed fracture with routine healing: Secondary | ICD-10-CM | POA: Diagnosis not present

## 2019-01-10 DIAGNOSIS — M25531 Pain in right wrist: Secondary | ICD-10-CM | POA: Diagnosis not present

## 2019-01-23 DIAGNOSIS — S52551A Other extraarticular fracture of lower end of right radius, initial encounter for closed fracture: Secondary | ICD-10-CM | POA: Diagnosis not present

## 2019-02-06 ENCOUNTER — Ambulatory Visit (INDEPENDENT_AMBULATORY_CARE_PROVIDER_SITE_OTHER): Payer: Medicare Other | Admitting: *Deleted

## 2019-02-06 DIAGNOSIS — I495 Sick sinus syndrome: Secondary | ICD-10-CM

## 2019-02-07 LAB — CUP PACEART REMOTE DEVICE CHECK
Battery Remaining Longevity: 154 mo
Battery Voltage: 3.03 V
Brady Statistic AP VP Percent: 0.06 %
Brady Statistic AP VS Percent: 79.41 %
Brady Statistic AS VP Percent: 0.02 %
Brady Statistic AS VS Percent: 20.58 %
Brady Statistic RA Percent Paced: 67.51 %
Brady Statistic RV Percent Paced: 0.51 %
Date Time Interrogation Session: 20200312113913
Implantable Lead Implant Date: 20061019
Implantable Lead Location: 753860
Implantable Lead Model: 5076
Lead Channel Impedance Value: 323 Ohm
Lead Channel Impedance Value: 380 Ohm
Lead Channel Impedance Value: 570 Ohm
Lead Channel Impedance Value: 627 Ohm
Lead Channel Pacing Threshold Amplitude: 0.5 V
Lead Channel Pacing Threshold Amplitude: 0.875 V
Lead Channel Pacing Threshold Pulse Width: 0.4 ms
Lead Channel Pacing Threshold Pulse Width: 0.4 ms
Lead Channel Sensing Intrinsic Amplitude: 2.25 mV
Lead Channel Sensing Intrinsic Amplitude: 2.25 mV
Lead Channel Sensing Intrinsic Amplitude: 2.875 mV
Lead Channel Sensing Intrinsic Amplitude: 2.875 mV
Lead Channel Setting Pacing Amplitude: 1.5 V
Lead Channel Setting Pacing Pulse Width: 0.4 ms
MDC IDC LEAD IMPLANT DT: 20061019
MDC IDC LEAD LOCATION: 753859
MDC IDC PG IMPLANT DT: 20180611
MDC IDC SET LEADCHNL RV PACING AMPLITUDE: 2.5 V
MDC IDC SET LEADCHNL RV SENSING SENSITIVITY: 0.9 mV

## 2019-02-13 ENCOUNTER — Encounter: Payer: Self-pay | Admitting: Cardiology

## 2019-02-13 NOTE — Progress Notes (Signed)
Remote pacemaker transmission.   

## 2019-02-25 DIAGNOSIS — S52551A Other extraarticular fracture of lower end of right radius, initial encounter for closed fracture: Secondary | ICD-10-CM | POA: Diagnosis not present

## 2019-03-19 ENCOUNTER — Other Ambulatory Visit: Payer: Self-pay | Admitting: Internal Medicine

## 2019-04-25 DIAGNOSIS — N183 Chronic kidney disease, stage 3 (moderate): Secondary | ICD-10-CM | POA: Diagnosis not present

## 2019-04-25 DIAGNOSIS — I480 Paroxysmal atrial fibrillation: Secondary | ICD-10-CM | POA: Diagnosis not present

## 2019-04-25 DIAGNOSIS — J449 Chronic obstructive pulmonary disease, unspecified: Secondary | ICD-10-CM | POA: Diagnosis not present

## 2019-04-25 DIAGNOSIS — E7800 Pure hypercholesterolemia, unspecified: Secondary | ICD-10-CM | POA: Diagnosis not present

## 2019-05-08 ENCOUNTER — Ambulatory Visit (INDEPENDENT_AMBULATORY_CARE_PROVIDER_SITE_OTHER): Payer: Medicare Other | Admitting: *Deleted

## 2019-05-08 DIAGNOSIS — I495 Sick sinus syndrome: Secondary | ICD-10-CM | POA: Diagnosis not present

## 2019-05-09 ENCOUNTER — Other Ambulatory Visit: Payer: Self-pay | Admitting: Internal Medicine

## 2019-05-09 LAB — CUP PACEART REMOTE DEVICE CHECK
Battery Remaining Longevity: 151 mo
Battery Voltage: 3.03 V
Brady Statistic AP VP Percent: 0.04 %
Brady Statistic AP VS Percent: 76.54 %
Brady Statistic AS VP Percent: 0.02 %
Brady Statistic AS VS Percent: 23.44 %
Brady Statistic RA Percent Paced: 69.62 %
Brady Statistic RV Percent Paced: 0.36 %
Date Time Interrogation Session: 20200612054216
Implantable Lead Implant Date: 20061019
Implantable Lead Implant Date: 20061019
Implantable Lead Location: 753859
Implantable Lead Location: 753860
Implantable Lead Model: 5076
Implantable Pulse Generator Implant Date: 20180611
Lead Channel Impedance Value: 304 Ohm
Lead Channel Impedance Value: 380 Ohm
Lead Channel Impedance Value: 532 Ohm
Lead Channel Impedance Value: 589 Ohm
Lead Channel Pacing Threshold Amplitude: 0.5 V
Lead Channel Pacing Threshold Amplitude: 0.75 V
Lead Channel Pacing Threshold Pulse Width: 0.4 ms
Lead Channel Pacing Threshold Pulse Width: 0.4 ms
Lead Channel Sensing Intrinsic Amplitude: 1.375 mV
Lead Channel Sensing Intrinsic Amplitude: 1.375 mV
Lead Channel Sensing Intrinsic Amplitude: 2.75 mV
Lead Channel Sensing Intrinsic Amplitude: 2.75 mV
Lead Channel Setting Pacing Amplitude: 1.5 V
Lead Channel Setting Pacing Amplitude: 2.5 V
Lead Channel Setting Pacing Pulse Width: 0.4 ms
Lead Channel Setting Sensing Sensitivity: 0.9 mV

## 2019-05-14 ENCOUNTER — Encounter: Payer: Self-pay | Admitting: Cardiology

## 2019-05-14 NOTE — Progress Notes (Signed)
Remote pacemaker transmission.   

## 2019-05-23 DIAGNOSIS — N183 Chronic kidney disease, stage 3 (moderate): Secondary | ICD-10-CM | POA: Diagnosis not present

## 2019-05-23 DIAGNOSIS — J449 Chronic obstructive pulmonary disease, unspecified: Secondary | ICD-10-CM | POA: Diagnosis not present

## 2019-05-23 DIAGNOSIS — E7800 Pure hypercholesterolemia, unspecified: Secondary | ICD-10-CM | POA: Diagnosis not present

## 2019-05-23 DIAGNOSIS — I480 Paroxysmal atrial fibrillation: Secondary | ICD-10-CM | POA: Diagnosis not present

## 2019-06-23 ENCOUNTER — Other Ambulatory Visit: Payer: Self-pay | Admitting: Internal Medicine

## 2019-06-24 DIAGNOSIS — E7800 Pure hypercholesterolemia, unspecified: Secondary | ICD-10-CM | POA: Diagnosis not present

## 2019-06-24 DIAGNOSIS — N183 Chronic kidney disease, stage 3 (moderate): Secondary | ICD-10-CM | POA: Diagnosis not present

## 2019-06-24 DIAGNOSIS — I480 Paroxysmal atrial fibrillation: Secondary | ICD-10-CM | POA: Diagnosis not present

## 2019-06-24 DIAGNOSIS — J449 Chronic obstructive pulmonary disease, unspecified: Secondary | ICD-10-CM | POA: Diagnosis not present

## 2019-06-30 ENCOUNTER — Telehealth: Payer: Self-pay | Admitting: Internal Medicine

## 2019-06-30 NOTE — Telephone Encounter (Signed)
New Message   Patient is calling because she fell into the house on 06/26/19 onto her chest. States that her chest is bruised really bad over the area where the pacemaker is. Patient also states that she was coughing up yellow phlegm and it hurts to cough. Patient is nervous about whether or not she has damaged her pacemaker. Please give her a call back to discuss.

## 2019-06-30 NOTE — Telephone Encounter (Signed)
Patient called b/c she fell and she wants to know if her PPM could have been damaged in the fall. SShe stated that she feels a little worse than usual. She stated that she feels a little short of breath, when she gets up out of the bed the PPM feels a little heavy but then it feels normal. Instructed pt to send a manual transmission. Attempted to help her trouble shoot her monitor error code 3230 with orange screen x2. Instructed pt to call tech support and that I would send a message to the Device Tech RN. Pt verbalized understanding.

## 2019-06-30 NOTE — Telephone Encounter (Signed)
LM at home and cell numbers requesting call back to DC. Gave direct number and office hours.  After unable to reach pt at either number, called back and left detailed message on home machine [facility]) with ED precautions for new or worsening symptoms overnight, otherwise requested call back tomorrow. No other names listed on DPR on file.

## 2019-07-01 NOTE — Telephone Encounter (Signed)
Spoke w/ pt, explained that Raquel Sarna was calling to instruct her to go to the ER if her symptoms worsened. Pt denies hitting her head when she fell last week, states she is feeling much better today. She will call [employer] tech services today to troubleshoot her monitor. We will look for her transmission. Pt would like a call back when the transmission has been received.

## 2019-07-02 NOTE — Telephone Encounter (Signed)
Per [facility], new monitor ordered 07/01/19.   LMOM (DPR) offering DC phone number if she needs assistance setting up monitor when it is received.

## 2019-07-07 ENCOUNTER — Other Ambulatory Visit: Payer: Self-pay | Admitting: Cardiovascular Disease

## 2019-07-07 NOTE — Telephone Encounter (Signed)
Pt last saw Dr Johnsie Cancel 10/28/18, last labs 11/15/18 Creat 1.03, age 85, weight 73.1kg, based on specified criteria pt is on appropriate dosage of Eliquis 5mg  BID.  Will refill rx.

## 2019-07-08 NOTE — Telephone Encounter (Signed)
New monitor ordered per [facility] 07-02-2019

## 2019-07-09 NOTE — Telephone Encounter (Signed)
Spoke with patient. Advised of normal PPM function. Pt reports that on the day of her fall, she was coming into the house with her dog, and she thinks her heavy storm door caused her to fall. She denies syncope, dizziness, or head injury. Pt reports it still hurts to sneeze or cough, but feels fine at rest, minimal ShOB with exertion. Feels like pain isn't the same as when she broke her ribs last year. Taking PRN tramadol, which has helped with the pain. She did not seek medical attention at the time of her fall, but is requesting recommendations as to "what to watch for" during the healing process.  PPM site still somewhat swollen and tender to touch, no drainage or redness at site, though left chest is ecchymotic (faded to green/purple from "black" in the past 2 weeks). No fever/chills. Able to move her arms and is back to working in her garden. Reports that she feels she is improving overall, though she is concerned that PPM site is still swollen. Reports that cough that she reported on 8/3 was due to sinus drainage as this has been ongoing "for a long time," symptoms stable at present, followed by Dr. Felipa Eth.   Encouraged pt to have PPM site assessed to ensure no hematoma, also overdue for 90m f/u with Dr. Caryl Comes as of 12/2018. Discussed current safety precautions in the office--pt feels comfortable coming in. Scheduled with M. Tillery, [place], on 07/11/19 at 8:00am. Pt agrees to call back in the interim with any questions or concerns.  Routed to Dr. Caryl Comes for review.       COVID-19 Pre-Screening Questions:  . In the past 7 to 10 days have you had a cough, shortness of breath, headache, congestion, fever (100 or greater), body aches, chills, sore throat, or sudden loss of taste or sense of smell? . Have you been around anyone with known Covid 19? Marland Kitchen Have you been around anyone who is awaiting Covid 19 test results in the past 7 to 10 days? . Have you been around anyone who has been exposed to Covid  19, or has mentioned symptoms of Covid 19 within the past 7 to 10 days?  Patient answered "no" to all questions (cough/nasal symptoms ongoing for long period of time).

## 2019-07-09 NOTE — Telephone Encounter (Signed)
Pt transmission received. I told her the nurse will give her a call back.

## 2019-07-09 NOTE — Telephone Encounter (Signed)
Patient called back in regards to the fall. She has received the new monitor. I instructed pt to send a manual transmission. She stated that she would do that and call back.

## 2019-07-09 NOTE — Telephone Encounter (Signed)
Transmission received. Normal PPM function. Lead trends stable. Presenting rhythm As, Ap/Vs @ 61bpm. 37 fast A&V episodes, EGMs show AF w/RVR. 154 AT/AF episodes (9.1% burden), +Eliquis. No VT episodes. Ap 60.7%, Vp 0.3%. Histograms stable since last remote transmission, V rates not well controlled during AT/AF. Will offer appointment with EP APP as pt is overdue for f/u with Dr. Caryl Comes as of 12/2018.

## 2019-07-10 NOTE — Telephone Encounter (Signed)
Noted! I'll let you both know how it looks on Friday.

## 2019-07-11 ENCOUNTER — Ambulatory Visit (INDEPENDENT_AMBULATORY_CARE_PROVIDER_SITE_OTHER): Payer: Medicare Other | Admitting: Student

## 2019-07-11 ENCOUNTER — Other Ambulatory Visit: Payer: Self-pay

## 2019-07-11 VITALS — BP 142/76 | HR 82 | Ht 66.0 in | Wt 159.0 lb

## 2019-07-11 DIAGNOSIS — I471 Supraventricular tachycardia, unspecified: Secondary | ICD-10-CM

## 2019-07-11 LAB — CUP PACEART INCLINIC DEVICE CHECK
Battery Remaining Longevity: 151 mo
Battery Voltage: 3.02 V
Brady Statistic AP VP Percent: 0.04 %
Brady Statistic AP VS Percent: 74.92 %
Brady Statistic AS VP Percent: 0.02 %
Brady Statistic AS VS Percent: 25.07 %
Brady Statistic RA Percent Paced: 66.55 %
Brady Statistic RV Percent Paced: 0.41 %
Date Time Interrogation Session: 20200814090704
Implantable Lead Implant Date: 20061019
Implantable Lead Implant Date: 20061019
Implantable Lead Location: 753859
Implantable Lead Location: 753860
Implantable Lead Model: 5076
Implantable Pulse Generator Implant Date: 20180611
Lead Channel Impedance Value: 342 Ohm
Lead Channel Impedance Value: 418 Ohm
Lead Channel Impedance Value: 684 Ohm
Lead Channel Impedance Value: 760 Ohm
Lead Channel Pacing Threshold Amplitude: 0.375 V
Lead Channel Pacing Threshold Amplitude: 0.75 V
Lead Channel Pacing Threshold Pulse Width: 0.4 ms
Lead Channel Pacing Threshold Pulse Width: 0.4 ms
Lead Channel Sensing Intrinsic Amplitude: 2.375 mV
Lead Channel Sensing Intrinsic Amplitude: 3 mV
Lead Channel Sensing Intrinsic Amplitude: 3 mV
Lead Channel Sensing Intrinsic Amplitude: 3.75 mV
Lead Channel Setting Pacing Amplitude: 1.5 V
Lead Channel Setting Pacing Amplitude: 2.5 V
Lead Channel Setting Pacing Pulse Width: 0.4 ms
Lead Channel Setting Sensing Sensitivity: 0.9 mV

## 2019-07-11 NOTE — Progress Notes (Addendum)
Electrophysiology Office Note Date: 07/11/2019  ID:  [name] [name], DOB 1932/09/01, MRN [id number]  PCP: Lajean Manes, MD Primary Cardiologist: Virl Axe, MD Electrophysiologist: Virl Axe, MD   CC: Pacemaker follow-up  [name] [name] is a 85 y.o. female seen today for Dr. Caryl Comes. She presents today for routine electrophysiology followup.  Since last being seen in our clinic, the patient reports doing well overall.   She had a fall last month and fell face first, with bruising on her chest. She did not seek care, other than this visit.  She denies exertional chest pain, palpitations, dyspnea, PND, orthopnea, nausea, vomiting, dizziness, syncope, edema, weight gain, or early satiety.  Device History: Medtronic Dual Chamber PPM implanted 08/2005, s/p gen change 05/07/2017 for symptomatic bradycardia.  Past Medical History:  Diagnosis Date  . Arthritis   . CVA    Lacunar and cerebellar infarct  . Depression   . DVT (deep venous thrombosis) ([place])    Details not available in the chart  . Edema   . Mitral valve regurgitation    a. echo 2009 mild to moderate MR  . Paroxysmal atrial fibrillation (HCC)   . Pneumonia    as a child  . Presence of permanent cardiac pacemaker   . Sinoatrial node dysfunction (HCC)   . Unspecified essential hypertension    Past Surgical History:  Procedure Laterality Date  . CLOSED REDUCTION ULNAR SHAFT Right 11/15/2018   Procedure: CLOSED REDUCTION RIGHT DISTAL ULNAR FRACTURE;  Surgeon: Charlotte Crumb, MD;  Location: [facility];  Service: Orthopedics;  Laterality: Right;  . OPEN REDUCTION INTERNAL FIXATION (ORIF) DISTAL RADIAL FRACTURE Right 11/15/2018   Procedure: OPEN REDUCTION INTERNAL FIXATION (ORIF) RIGHT DISTAL RADIAL FRACTURE;  Surgeon: Charlotte Crumb, MD;  Location: [facility];  Service: Orthopedics;  Laterality: Right;  . PACEMAKER INSERTION  09-14-2005   MDT EnRhythm dual chamber pacemaker implanted by Dr Lovena Le for SND  .  PPM GENERATOR CHANGEOUT N/A 05/07/2017   Procedure: PPM Generator Changeout;  Surgeon: Deboraha Sprang, MD;  Location: [facility];  Service: Cardiovascular;  Laterality: N/A;  . TONSILLECTOMY      Current Outpatient Medications  Medication Sig Dispense Refill  . allopurinol (ZYLOPRIM) 100 MG tablet Take 100 mg by mouth daily.    . Alpha-D-Galactosidase (BEANO) TABS Take 1 tablet by mouth 3 (three) times daily before meals.    Marland Kitchen Apoaequorin (PREVAGEN) 10 MG CAPS Take 10 mg by mouth daily.     . Cholecalciferol (VITAMIN D3) 2000 UNITS TABS Take 2,000 Units by mouth daily.     Marland Kitchen desonide (DESOWEN) 0.05 % ointment Apply 1 application topically 2 (two) times daily as needed (for irritation).     Marland Kitchen diltiazem (CARDIZEM CD) 120 MG 24 hr capsule Take 1 capsule (120 mg total) by mouth daily. 90 capsule 0  . diltiazem (CARDIZEM) 30 MG tablet TAKE 1 TABLET ONCE DAILY AS NEEDED FOR PALPATATION. 90 tablet 0  . ELIQUIS 5 MG TABS tablet TAKE 1 TABLET BY MOUTH TWICE DAILY. 60 tablet 5  . ibuprofen (ADVIL,MOTRIN) 400 MG tablet Take 1 tablet (400 mg total) by mouth every 6 (six) hours as needed for fever or mild pain. 30 tablet 0  . Lactase (LACTAID PO) Take 1 tablet by mouth 3 (three) times daily before meals.    Marland Kitchen loratadine (CLARITIN) 10 MG tablet Take 1 tablet (10 mg total) by mouth daily. For allergies, congestion (Patient taking differently: Take 10 mg by mouth daily. ) 30  tablet 3  . LORazepam (ATIVAN) 0.5 MG tablet Take 0.5 mg by mouth daily as needed for anxiety or sleep.     . Magnesium Oxide 400 (240 Mg) MG TABS TAKE 1 TABLET ONCE DAILY. 90 tablet 1  . Polyvinyl Alcohol-Povidone (REFRESH OP) Place 1 drop into both eyes 2 (two) times daily as needed (for dry eyes).     . sodium chloride (OCEAN) 0.65 % SOLN nasal spray Place 1 spray into both nostrils as needed for congestion.    . traMADol (ULTRAM) 50 MG tablet Take 1 tablet (50 mg total) by mouth every 6 (six) hours as needed. (Patient taking  differently: Take 50 mg by mouth every 6 (six) hours as needed for moderate pain. ) 12 tablet 0   No current facility-administered medications for this visit.     Allergies:   Gluten meal, Itraconazole, Lactose intolerance (gi), and Statins   Social History: Social History   Socioeconomic History  . Marital status: Widowed    Spouse name: Not on file  . Number of children: Not on file  . Years of education: Not on file  . Highest education level: Not on file  Occupational History  . Not on file  Social Needs  . Financial resource strain: Not on file  . Food insecurity    Worry: Not on file    Inability: Not on file  . Transportation needs    Medical: Not on file    Non-medical: Not on file  Tobacco Use  . Smoking status: Former Smoker    Quit date: 08/13/1984    Years since quitting: 34.9  . Smokeless tobacco: Never Used  Substance and Sexual Activity  . Alcohol use: Yes    Alcohol/week: 14.0 standard drinks    Types: 14 Glasses of wine per week  . Drug use: No  . Sexual activity: Not Currently  Lifestyle  . Physical activity    Days per week: Not on file    Minutes per session: Not on file  . Stress: Not on file  Relationships  . Social Herbalist on phone: Not on file    Gets together: Not on file    Attends religious service: Not on file    Active member of club or organization: Not on file    Attends meetings of clubs or organizations: Not on file    Relationship status: Not on file  . Intimate partner violence    Fear of current or ex partner: Not on file    Emotionally abused: Not on file    Physically abused: Not on file    Forced sexual activity: Not on file  Other Topics Concern  . Not on file  Social History Narrative  . Not on file   Family History: Family History  Problem Relation Age of Onset  . Cancer Mother   . Mitral valve prolapse Mother   . Diabetes Father   . Hypertension Father   . Coronary artery disease Sister   .  Stroke Brother   . Coronary artery disease Sister     Review of Systems: All other systems reviewed and are otherwise negative except as noted above.  Physical Exam: Vitals:   07/11/19 0818  BP: (!) 142/76  Pulse: 82  Weight: 159 lb (72.1 kg)  Height: 5\' 6"  (1.676 m)     GEN- The patient is well appearing, alert and oriented x 3 today.   HEENT: normocephalic, atraumatic; sclera clear, conjunctiva pink;  hearing intact; oropharynx clear; neck supple  Lungs- Clear to ausculation bilaterally, normal work of breathing.  No wheezes, rales, rhonchi Heart- Regular rate and rhythm, no murmurs, rubs or gallops  GI- soft, non-tender, non-distended, bowel sounds present  Extremities- no clubbing, cyanosis, or edema. Pain in her left posterior shoulder with AROM of raising arm above shoulder or shrugging her shoulder up. Otherwise pain free AROM.  MS- no significant deformity or atrophy Skin- warm and dry, no rash or lesion; PPM pocket well healed. No taching. Pt has ecchymosis along the superior portion of her left breast.  Psych- euthymic mood, full affect Neuro- strength and sensation are intact  PPM Interrogation- reviewed in detail today,  See PACEART report  EKG:  EKG is ordered today. The ekg ordered today shows A paced rhythm at 82 bpm, personally reviewed  Recent Labs: 09/02/2018: TSH 4.876 11/15/2018: BUN 23; Creatinine, Ser 1.03; Hemoglobin 12.9; Platelets 155; Potassium 3.7; Sodium 142   Wt Readings from Last 3 Encounters:  07/11/19 159 lb (72.1 kg)  11/15/18 161 lb 4 oz (73.1 kg)  11/15/18 161 lb 4 oz (73.1 kg)     Other studies Reviewed: Additional studies/ records that were reviewed today include: Previous remote report, previous office visits   Assessment and Plan:  1.  Symptomatic bradycardia s/p Medtronic PPM  Normal PPM function; Communicating for remotes. See Pace Art report No changes today  2. Paroxysmal Atrial fibrillation Stable on amiodarone  CHA2DS2VASC score is 6. No bleeding on eliquis  3. Fall Normal device function, and no evidence of significant trauma to her device.  Expressed concern to patient that she may have injured her rotator cuff in her fall. Recommended follow up with her PCP > Ortho if warranted.    Current medicines are reviewed at length with the patient today.   The patient does not have concerns regarding her medicines.  The following changes were made today:  none  Labs/ tests ordered today include:  Orders Placed This Encounter  Procedures  . CUP PACEART [place]  . EKG 12-Lead     Disposition:   Follow up with Dr. Caryl Comes annually  Signed, Shirley Friar, PA-C  07/11/2019 9:13 AM  [facility] [address] [place] [address] [phone] (office) [phone] (fax)

## 2019-07-11 NOTE — Patient Instructions (Signed)
Medication Instructions:  Your physician recommends that you continue on your current medications as directed. Please refer to the Current Medication list given to you today.  If you need a refill on your cardiac medications before your next appointment, please call your pharmacy.   Lab work: NONE ORDERED  TODAY   If you have labs (blood work) drawn today and your tests are completely normal, you will receive your results only by: Marland Kitchen MyChart Message (if you have MyChart) OR . A paper copy in the mail If you have any lab test that is abnormal or we need to change your treatment, we will call you to review the results.  Testing/Procedures: NONE ORDERED  TODAY  Follow-Up: At [facility], you and your health needs are our priority.  As part of our continuing mission to provide you with exceptional heart care, we have created designated Provider Care Teams.  These Care Teams include your primary Cardiologist (physician) and Advanced Practice Providers (APPs -  Physician Assistants and Nurse Practitioners) who all work together to provide you with the care you need, when you need it. You will need a follow up appointment in 1 years.  Please call our office 2 months in advance to schedule this appointment.  You may see Virl Axe, MD or one of the following Advanced Practice Providers on your designated Care Team:   Chanetta Marshall, NP . Tommye Standard, PA-C . Joesph July PA-C   Any Other Special Instructions Will Be Listed Below (If Applicable).

## 2019-07-14 ENCOUNTER — Telehealth: Payer: Self-pay

## 2019-07-14 NOTE — Telephone Encounter (Signed)
noted 

## 2019-07-14 NOTE — Telephone Encounter (Signed)
Alert received for AF burden and RVR. Presenting AF w/ vent resp 120-140's. AF burden 9.6% since clinic visit 07/11/19. +Eliquis. Will route to Dr. Caryl Comes for review.

## 2019-07-14 NOTE — Telephone Encounter (Signed)
PPM function normal, no inflammatory changes around device. No skin adherence to the top of the device.  She had pain in the back of her shoulder with active range of motion so I recommended she follow up with her PCP to have her rotator cuff assessed.

## 2019-07-15 NOTE — Telephone Encounter (Signed)
Noted  

## 2019-07-16 NOTE — Telephone Encounter (Signed)
Lets increase dilt to 120 bid and arrange for telehealth visit

## 2019-07-18 NOTE — Telephone Encounter (Signed)
LVM for pt to return call and discuss medication adjustments.

## 2019-07-19 NOTE — Telephone Encounter (Signed)
[place] THX so much  Lorren reached out to her also, to discuss If she doesn't want to do telehealth visit  Then app is great idea

## 2019-07-21 MED ORDER — DILTIAZEM HCL ER COATED BEADS 120 MG PO CP24: 120.0000 mg | ORAL_CAPSULE | Freq: Two times a day (BID) | ORAL | 3 refills | Status: DC

## 2019-07-21 NOTE — Telephone Encounter (Signed)
Pt aware of medication increase, Diltiazem 120mg  capsule, bid. She will begin tonight.  She has agreed to see Tommye Standard, PA next week to discuss AF plan. She had no additional questions at this time.

## 2019-07-30 NOTE — Progress Notes (Signed)
Cardiology Office Note Date:  07/30/2019  Patient ID:  [name], [name] 06-07-1932, MRN [id number] PCP:  Lajean Manes, MD  Cardiologist:  Dr. Johnsie Cancel Electrophysiologist: Dr. Caryl Comes    Chief Complaint: increased AF burden noted  History of Present Illness: [name] [name] is a 85 y.o. female with history of PAFib, tcahy-brady w/PPM, HTN, CVA, DVT.  She comes in today to be seen for dr. Caryl Comes.  She was recently seen by A. Albertina Senegal, [place] 07/11/2019, he mentions she had fallen a few weeks prior to her visit did not seek attention, seemed there was some concern she may had suffered injury to her shoulder, also had bruising to her chest wall. In review of the pacer check done, at that visit noted 11.4% AFib burden, rates not very well controlled in AFib, burden appeared similar to that in the last 12 mo. She was in SR/paced at that visit  Device clinic got a AFib burden alert 8/17, with presenting AFib RVR, rates 120's-140's.  Her dilt was increased to BID and asked to come in for evaluation.   The patient today feels well.  Sunday she was very aware of her heart racing, did not particularly feel poorly though was uncomfortable, this as the first time she was really aware of her AFib to that extent.  She denies any CP or SOB, no dizzy spells, no near syncope or syncope  She is tolerating the BID diltiazem so far without issue  She denies any pulmonary concerns, she did not do f/u PFTs in 12mo as recommended, says was very hard and anxiety provoking for her, she does not want to do them, denies any SOB or pulmonary symptoms, does not want to repeat the test     Falls July 2020, fall with chest wall trauma, she is not entirely sure of the mechanism, said she was coming inside, had something in her had, was struggling with the heavy door and her dog was near by, she seems to recall falling, and does not think she fainted, just not sure what tripped her up Dec 2019: a trip/mechanical  fall, broke her R wrist trying to stop herslef Hospitalized 05/2018 after a slip fall accident in her garden resulting in multiple rib fractures and PTX  AFib Hx Rate control and AAD hx Intolerant of metoprolol (unclear why) Intolerant of CCB with edema  Rythmol stopped 2/2 ataxia/neurotoxicity at lower doses recurrent AFib June 2019 discussed AFib ablation with Dr. Rayann Heman, though patient did not want to purse this Amiodarone started June 2018  >> Stpped Dec 2019, she says with pulmonary concerns, I not Dr. Johnsie Cancel stopped with abn PFTs         Device information:  MDT dual chamber PPM, implanted 09/14/05, gen change 05/07/17   Past Medical History:  Diagnosis Date  . Arthritis   . CVA    Lacunar and cerebellar infarct  . Depression   . DVT (deep venous thrombosis) ([place])    Details not available in the chart  . Edema   . Mitral valve regurgitation    a. echo 2009 mild to moderate MR  . Paroxysmal atrial fibrillation (HCC)   . Pneumonia    as a child  . Presence of permanent cardiac pacemaker   . Sinoatrial node dysfunction (HCC)   . Unspecified essential hypertension     Past Surgical History:  Procedure Laterality Date  . CLOSED REDUCTION ULNAR SHAFT Right 11/15/2018   Procedure: CLOSED REDUCTION RIGHT DISTAL ULNAR FRACTURE;  Surgeon: Charlotte Crumb, MD;  Location: [facility];  Service: Orthopedics;  Laterality: Right;  . OPEN REDUCTION INTERNAL FIXATION (ORIF) DISTAL RADIAL FRACTURE Right 11/15/2018   Procedure: OPEN REDUCTION INTERNAL FIXATION (ORIF) RIGHT DISTAL RADIAL FRACTURE;  Surgeon: Charlotte Crumb, MD;  Location: [facility];  Service: Orthopedics;  Laterality: Right;  . PACEMAKER INSERTION  09-14-2005   MDT EnRhythm dual chamber pacemaker implanted by Dr Lovena Le for SND  . PPM GENERATOR CHANGEOUT N/A 05/07/2017   Procedure: PPM Generator Changeout;  Surgeon: Deboraha Sprang, MD;  Location: [place] [facility];  Service: Cardiovascular;  Laterality: N/A;  . TONSILLECTOMY       Current Outpatient Medications  Medication Sig Dispense Refill  . allopurinol (ZYLOPRIM) 100 MG tablet Take 100 mg by mouth daily.    . Alpha-D-Galactosidase (BEANO) TABS Take 1 tablet by mouth 3 (three) times daily before meals.    Marland Kitchen Apoaequorin (PREVAGEN) 10 MG CAPS Take 10 mg by mouth daily.     . Cholecalciferol (VITAMIN D3) 2000 UNITS TABS Take 2,000 Units by mouth daily.     Marland Kitchen desonide (DESOWEN) 0.05 % ointment Apply 1 application topically 2 (two) times daily as needed (for irritation).     Marland Kitchen diltiazem (CARDIZEM CD) 120 MG 24 hr capsule Take 1 capsule (120 mg total) by mouth 2 (two) times daily. 90 capsule 3  . diltiazem (CARDIZEM) 30 MG tablet TAKE 1 TABLET ONCE DAILY AS NEEDED FOR PALPATATION. 90 tablet 0  . ELIQUIS 5 MG TABS tablet TAKE 1 TABLET BY MOUTH TWICE DAILY. 60 tablet 5  . ibuprofen (ADVIL,MOTRIN) 400 MG tablet Take 1 tablet (400 mg total) by mouth every 6 (six) hours as needed for fever or mild pain. 30 tablet 0  . Lactase (LACTAID PO) Take 1 tablet by mouth 3 (three) times daily before meals.    Marland Kitchen loratadine (CLARITIN) 10 MG tablet Take 1 tablet (10 mg total) by mouth daily. For allergies, congestion (Patient taking differently: Take 10 mg by mouth daily. ) 30 tablet 3  . LORazepam (ATIVAN) 0.5 MG tablet Take 0.5 mg by mouth daily as needed for anxiety or sleep.     . Magnesium Oxide 400 (240 Mg) MG TABS TAKE 1 TABLET ONCE DAILY. 90 tablet 1  . Polyvinyl Alcohol-Povidone (REFRESH OP) Place 1 drop into both eyes 2 (two) times daily as needed (for dry eyes).     . sodium chloride (OCEAN) 0.65 % SOLN nasal spray Place 1 spray into both nostrils as needed for congestion.    . traMADol (ULTRAM) 50 MG tablet Take 1 tablet (50 mg total) by mouth every 6 (six) hours as needed. (Patient taking differently: Take 50 mg by mouth every 6 (six) hours as needed for moderate pain. ) 12 tablet 0   No current facility-administered medications for this visit.     Allergies:   Gluten  meal, Itraconazole, Lactose intolerance (gi), and Statins   Social History:  The patient  reports that she quit smoking about 34 years ago. She has never used smokeless tobacco. She reports current alcohol use of about 14.0 standard drinks of alcohol per week. She reports that she does not use drugs.   Family History:  The patient's family history includes Cancer in her mother; Coronary artery disease in her sister and sister; Diabetes in her father; Hypertension in her father; Mitral valve prolapse in her mother; Stroke in her brother.  ROS:  Please see the history of present illness.  All other systems are  reviewed and otherwise negative.   PHYSICAL EXAM:  VS:  There were no vitals taken for this visit. BMI: There is no height or weight on file to calculate BMI. Well nourished, well developed, in no acute distress  HEENT: normocephalic, atraumatic  Neck: no JVD, carotid bruits or masses Cardiac: RRR; no significant murmurs, no rubs, or gallops Lungs:  CTA b/l, no wheezing, rhonchi or rales  Abd: soft, nontender MS: no deformity age appropriate atrophy Ext: no edema, chronic looking skin changes Skin: warm and dry, no rash Neuro:  No gross deficits appreciated Psych: euthymic mood, full affect  PPM site is stable, no tethering or discomfort   EKG:  Not done today PPM interrogation done today by industry and reviewed by myself: battery and lead measurements are ok AF burden is 9% Rates uncontrolled in AF AP 61.5% VP 0.4%   06/30/14: TTE - Left ventricle: The cavity size was normal. Systolic function was normal. The estimated ejection fraction was in the range of 50% to 55%. Wall motion was normal; there were no regional wall motion abnormalities. Left ventricular diastolic function parameters were normal. - Aortic valve: Trileaflet; mildly thickened, mildly calcified leaflets. There was no significant regurgitation. - Aortic root: The aortic root was normal in  size. - Mitral valve: Calcified annulus. Mildly thickened leaflets . There was mild regurgitation. - Left atrium: The atrium was at the upper limits of normal in size. - Right ventricle: Systolic function was normal. - Right atrium: The atrium was normal in size. - Tricuspid valve: There was moderate regurgitation. - Pulmonary arteries: Systolic pressure was mildly increased. PA peak pressure: 42 mm Hg (S) Impressions - Normal biventricular size and function. Mild pulmonary hypertension.    Recent Labs: 09/02/2018: TSH 4.876 11/15/2018: BUN 23; Creatinine, Ser 1.03; Hemoglobin 12.9; Platelets 155; Potassium 3.7; Sodium 142  No results found for requested labs within last 8760 hours.   CrCl cannot be calculated (Patient's most recent lab result is older than the maximum 21 days allowed.).   Wt Readings from Last 3 Encounters:  07/11/19 159 lb (72.1 kg)  11/15/18 161 lb 4 oz (73.1 kg)  11/15/18 161 lb 4 oz (73.1 kg)     Other studies reviewed: Additional studies/records reviewed today include: summarized above  ASSESSMENT AND PLAN:  1. PAFib     CHA2DS2Vasc is 6 on Eliquis, appropriately dosed     AFib 9% burden (appears to wax/wane), minimally symptomatic overall with her AFib though rates are poorly controlled     She does not recall why the metoprolol was stopped, she thought was Dr. Olin Pia idea   2. Tachy-brady w/PPM     Intact pacer function, no programming changes made     She has not been sleeping on the couch (likes to sleep in the same room as her dog) and not ny her monitor     Discussed rational for sleeping by her monitor, she understands and says her dog has been OK again, and she is back to sleeping in her room, starting last night   3. HTN     Looks OK, slightly up   4. Falls     We spoke AT LENGTH about falls and safety and concerns on a/c     She does not at all feel like her fall frequency is significant     We spoke about home safety,  removing any and all trip hazards and importance of paying attention to her surroundings and not multitasking so much  Disposition: Continue dilt 120mg  BID.   At the time of the patient's visit, I misunderstood the timing of the increase in her diltiazem, thinking she had just made the change with plans to monitor her response,   with plans for no changes to her regime to get BMET and CBC today for her Eliquis.   Turns out has been a couple weeks on the BID dilt. Given she has had RVR despite the increase in her dilt, I have asked my MA to call the patient with decision to start metoprolol Succ 50mg  daily.  I think if she tolerates this, we could consider perhaps Sotalol for an AAD if need.  Though I am not sure her AF burden off the amio is much different then it was before.    Current medicines are reviewed at length with the patient today.  The patient did not have any concerns regarding medicines.  Haywood Lasso, PA-C 07/30/2019 7:54 PM     [address] [place] [address] [phone] (office)  [phone] (fax)

## 2019-07-31 ENCOUNTER — Encounter: Payer: Self-pay | Admitting: Physician Assistant

## 2019-07-31 ENCOUNTER — Ambulatory Visit (INDEPENDENT_AMBULATORY_CARE_PROVIDER_SITE_OTHER): Payer: Medicare Other | Admitting: Physician Assistant

## 2019-07-31 ENCOUNTER — Other Ambulatory Visit: Payer: Self-pay

## 2019-07-31 VITALS — BP 147/84 | HR 95 | Ht 66.5 in | Wt 162.0 lb

## 2019-07-31 DIAGNOSIS — Z79899 Other long term (current) drug therapy: Secondary | ICD-10-CM | POA: Diagnosis not present

## 2019-07-31 DIAGNOSIS — Z950 Presence of cardiac pacemaker: Secondary | ICD-10-CM | POA: Diagnosis not present

## 2019-07-31 DIAGNOSIS — I10 Essential (primary) hypertension: Secondary | ICD-10-CM

## 2019-07-31 DIAGNOSIS — I480 Paroxysmal atrial fibrillation: Secondary | ICD-10-CM | POA: Diagnosis not present

## 2019-07-31 MED ORDER — METOPROLOL SUCCINATE ER 25 MG PO TB24: 25.0000 mg | ORAL_TABLET | Freq: Every day | ORAL | 3 refills | Status: DC

## 2019-07-31 MED ORDER — DILTIAZEM HCL 30 MG PO TABS: ORAL_TABLET | ORAL | 0 refills | Status: DC

## 2019-07-31 MED ORDER — DILTIAZEM HCL ER COATED BEADS 120 MG PO CP24: 120.0000 mg | ORAL_CAPSULE | Freq: Two times a day (BID) | ORAL | 1 refills | Status: DC

## 2019-07-31 NOTE — Patient Instructions (Addendum)
Medication Instructions:   Your physician recommends that you continue on your current medications as directed. Please refer to the Current Medication list given to you today.  If you need a refill on your cardiac medications before your next appointment, please call your pharmacy.   Lab work:  BMET AND CBC    If you have labs (blood work) drawn today and your tests are completely normal, you will receive your results only by: Marland Kitchen MyChart Message (if you have MyChart) OR . A paper copy in the mail If you have any lab test that is abnormal or we need to change your treatment, we will call you to review the results.  Testing/Procedures: NONE ORDERED  TODAY    Follow-Up: IN  [place] OR APP  URUSY , TILLERY . SEILER  Any Other Special Instructions Will Be Listed Below (If Applicable).

## 2019-08-01 LAB — BASIC METABOLIC PANEL
BUN/Creatinine Ratio: 28 (ref 12–28)
BUN: 32 mg/dL — ABNORMAL HIGH (ref 8–27)
CO2: 22 mmol/L (ref 20–29)
Calcium: 9.6 mg/dL (ref 8.7–10.3)
Chloride: 105 mmol/L (ref 96–106)
Creatinine, Ser: 1.16 mg/dL — ABNORMAL HIGH (ref 0.57–1.00)
GFR calc Af Amer: 50 mL/min/{1.73_m2} — ABNORMAL LOW (ref >59)
GFR calc non Af Amer: 43 mL/min/{1.73_m2} — ABNORMAL LOW (ref >59)
Glucose: 102 mg/dL — ABNORMAL HIGH (ref 65–99)
Potassium: 4.3 mmol/L (ref 3.5–5.2)
Sodium: 143 mmol/L (ref 134–144)

## 2019-08-01 LAB — CBC
Hematocrit: 42.6 % (ref 34.0–46.6)
Hemoglobin: 14.7 g/dL (ref 11.1–15.9)
MCH: 34.3 pg — ABNORMAL HIGH (ref 26.6–33.0)
MCHC: 34.5 g/dL (ref 31.5–35.7)
MCV: 99 fL — ABNORMAL HIGH (ref 79–97)
Platelets: 142 10*3/uL — ABNORMAL LOW (ref 150–450)
RBC: 4.29 x10E6/uL (ref 3.77–5.28)
RDW: 14.4 % (ref 11.7–15.4)
WBC: 7.7 10*3/uL (ref 3.4–10.8)

## 2019-08-02 LAB — CUP PACEART REMOTE DEVICE CHECK
Battery Remaining Longevity: 150 mo
Battery Voltage: 3.02 V
Brady Statistic AP VP Percent: 0.05 %
Brady Statistic AP VS Percent: 73.09 %
Brady Statistic AS VP Percent: 0.02 %
Brady Statistic AS VS Percent: 26.95 %
Brady Statistic RA Percent Paced: 56.56 %
Brady Statistic RV Percent Paced: 0.53 %
Date Time Interrogation Session: 20200905000457
Implantable Lead Implant Date: 20061019
Implantable Lead Implant Date: 20061019
Implantable Lead Location: 753859
Implantable Lead Location: 753860
Implantable Lead Model: 5076
Implantable Pulse Generator Implant Date: 20180611
Lead Channel Impedance Value: 323 Ohm
Lead Channel Impedance Value: 399 Ohm
Lead Channel Impedance Value: 646 Ohm
Lead Channel Impedance Value: 722 Ohm
Lead Channel Pacing Threshold Amplitude: 0.5 V
Lead Channel Pacing Threshold Amplitude: 0.75 V
Lead Channel Pacing Threshold Pulse Width: 0.4 ms
Lead Channel Pacing Threshold Pulse Width: 0.4 ms
Lead Channel Sensing Intrinsic Amplitude: 2.25 mV
Lead Channel Sensing Intrinsic Amplitude: 2.25 mV
Lead Channel Sensing Intrinsic Amplitude: 3 mV
Lead Channel Sensing Intrinsic Amplitude: 3 mV
Lead Channel Setting Pacing Amplitude: 1.5 V
Lead Channel Setting Pacing Amplitude: 2.5 V
Lead Channel Setting Pacing Pulse Width: 0.4 ms
Lead Channel Setting Sensing Sensitivity: 0.9 mV

## 2019-08-07 ENCOUNTER — Ambulatory Visit (INDEPENDENT_AMBULATORY_CARE_PROVIDER_SITE_OTHER): Payer: Medicare Other | Admitting: *Deleted

## 2019-08-07 DIAGNOSIS — I495 Sick sinus syndrome: Secondary | ICD-10-CM

## 2019-08-07 DIAGNOSIS — I4891 Unspecified atrial fibrillation: Secondary | ICD-10-CM

## 2019-08-07 LAB — CUP PACEART REMOTE DEVICE CHECK
Battery Remaining Longevity: 149 mo
Battery Voltage: 3.02 V
Brady Statistic AP VP Percent: 0.03 %
Brady Statistic AP VS Percent: 97.41 %
Brady Statistic AS VP Percent: 0.01 %
Brady Statistic AS VS Percent: 2.57 %
Brady Statistic RA Percent Paced: 93.17 %
Brady Statistic RV Percent Paced: 0.52 %
Date Time Interrogation Session: 20200910041517
Implantable Lead Implant Date: 20061019
Implantable Lead Implant Date: 20061019
Implantable Lead Location: 753859
Implantable Lead Location: 753860
Implantable Lead Model: 5076
Implantable Pulse Generator Implant Date: 20180611
Lead Channel Impedance Value: 342 Ohm
Lead Channel Impedance Value: 399 Ohm
Lead Channel Impedance Value: 665 Ohm
Lead Channel Impedance Value: 722 Ohm
Lead Channel Pacing Threshold Amplitude: 0.5 V
Lead Channel Pacing Threshold Amplitude: 0.75 V
Lead Channel Pacing Threshold Pulse Width: 0.4 ms
Lead Channel Pacing Threshold Pulse Width: 0.4 ms
Lead Channel Sensing Intrinsic Amplitude: 2.625 mV
Lead Channel Sensing Intrinsic Amplitude: 3.125 mV
Lead Channel Setting Pacing Amplitude: 1.5 V
Lead Channel Setting Pacing Amplitude: 2.5 V
Lead Channel Setting Pacing Pulse Width: 0.4 ms
Lead Channel Setting Sensing Sensitivity: 0.9 mV

## 2019-08-18 NOTE — Progress Notes (Signed)
Remote pacemaker transmission.   

## 2019-08-19 NOTE — Progress Notes (Signed)
Electrophysiology Office Note Date: 08/20/2019  ID:  [name] [name], DOB 21-Oct-1932, MRN [id number]  PCP: Lajean Manes, MD Primary Cardiologist: Virl Axe, MD Electrophysiologist: Virl Axe, MD  CC: Pacemaker follow-up  [name] [name] is a 85 y.o. female with a history of PAFib, tcahy-brady w/PPM, HTN, CVA, and DVT who presents today for routine electrophysiology followup seen today for Dr. Caryl Comes. She presents today for routine electrophysiology followup.  Since last being seen in our clinic, the patient reports doing very well. She has felt better on Toprol. She denies chest pain, palpitations, dyspnea, PND, orthopnea, nausea, vomiting, dizziness, syncope, edema, weight gain, or early satiety.  Device History: Medtronic Dual Chamber PPM implanted 09/14/05 -> gen change 05/07/17 for tachy-brady syndrome  Past Medical History:  Diagnosis Date  . Arthritis   . CVA    Lacunar and cerebellar infarct  . Depression   . DVT (deep venous thrombosis) ([place])    Details not available in the chart  . Edema   . Mitral valve regurgitation    a. echo 2009 mild to moderate MR  . Paroxysmal atrial fibrillation (HCC)   . Pneumonia    as a child  . Presence of permanent cardiac pacemaker   . Sinoatrial node dysfunction (HCC)   . Unspecified essential hypertension    Past Surgical History:  Procedure Laterality Date  . CLOSED REDUCTION ULNAR SHAFT Right 11/15/2018   Procedure: CLOSED REDUCTION RIGHT DISTAL ULNAR FRACTURE;  Surgeon: Charlotte Crumb, MD;  Location: [facility] [facility];  Service: Orthopedics;  Laterality: Right;  . OPEN REDUCTION INTERNAL FIXATION (ORIF) DISTAL RADIAL FRACTURE Right 11/15/2018   Procedure: OPEN REDUCTION INTERNAL FIXATION (ORIF) RIGHT DISTAL RADIAL FRACTURE;  Surgeon: Charlotte Crumb, MD;  Location: [facility];  Service: Orthopedics;  Laterality: Right;  . PACEMAKER INSERTION  09-14-2005   MDT EnRhythm dual chamber pacemaker implanted by Dr Lovena Le for SND   . PPM GENERATOR CHANGEOUT N/A 05/07/2017   Procedure: PPM Generator Changeout;  Surgeon: Deboraha Sprang, MD;  Location: [facility];  Service: Cardiovascular;  Laterality: N/A;  . TONSILLECTOMY      Current Outpatient Medications  Medication Sig Dispense Refill  . allopurinol (ZYLOPRIM) 100 MG tablet Take 100 mg by mouth daily.    . Alpha-D-Galactosidase (BEANO) TABS Take 1 tablet by mouth 3 (three) times daily before meals.    Marland Kitchen Apoaequorin (PREVAGEN) 10 MG CAPS Take 10 mg by mouth daily.     . Cholecalciferol (VITAMIN D3) 2000 UNITS TABS Take 2,000 Units by mouth daily.     Marland Kitchen desonide (DESOWEN) 0.05 % ointment Apply 1 application topically 2 (two) times daily as needed (for irritation).     Marland Kitchen diltiazem (CARDIZEM CD) 120 MG 24 hr capsule Take 1 capsule (120 mg total) by mouth 2 (two) times daily. 180 capsule 1  . diltiazem (CARDIZEM) 30 MG tablet TAKE 1 TABLET ONCE DAILY AS NEEDED FOR PALPATATION. 90 tablet 0  . ELIQUIS 5 MG TABS tablet TAKE 1 TABLET BY MOUTH TWICE DAILY. 60 tablet 5  . ibuprofen (ADVIL,MOTRIN) 400 MG tablet Take 1 tablet (400 mg total) by mouth every 6 (six) hours as needed for fever or mild pain. 30 tablet 0  . Lactase (LACTAID PO) Take 1 tablet by mouth 3 (three) times daily before meals.    Marland Kitchen loratadine (CLARITIN) 10 MG tablet Take 1 tablet (10 mg total) by mouth daily. For allergies, congestion (Patient taking differently: Take 10 mg by mouth daily. ) 30 tablet 3  .  LORazepam (ATIVAN) 0.5 MG tablet Take 0.5 mg by mouth daily as needed for anxiety or sleep.     . Magnesium Oxide 400 (240 Mg) MG TABS TAKE 1 TABLET ONCE DAILY. 90 tablet 1  . metoprolol succinate (TOPROL-XL) 50 MG 24 hr tablet Take 1 tablet (50 mg total) by mouth daily. 90 tablet 3  . Polyvinyl Alcohol-Povidone (REFRESH OP) Place 1 drop into both eyes 2 (two) times daily as needed (for dry eyes).     . sodium chloride (OCEAN) 0.65 % SOLN nasal spray Place 1 spray into both nostrils as needed for  congestion.    . traMADol (ULTRAM) 50 MG tablet Take 1 tablet (50 mg total) by mouth every 6 (six) hours as needed. (Patient taking differently: Take 50 mg by mouth every 6 (six) hours as needed for moderate pain. ) 12 tablet 0   No current facility-administered medications for this visit.     Allergies:   Gluten meal, Itraconazole, Lactose intolerance (gi), and Statins   Social History: Social History   Socioeconomic History  . Marital status: Widowed    Spouse name: Not on file  . Number of children: Not on file  . Years of education: Not on file  . Highest education level: Not on file  Occupational History  . Not on file  Social Needs  . Financial resource strain: Not on file  . Food insecurity    Worry: Not on file    Inability: Not on file  . Transportation needs    Medical: Not on file    Non-medical: Not on file  Tobacco Use  . Smoking status: Former Smoker    Quit date: 08/13/1984    Years since quitting: 35.0  . Smokeless tobacco: Never Used  Substance and Sexual Activity  . Alcohol use: Yes    Alcohol/week: 14.0 standard drinks    Types: 14 Glasses of wine per week  . Drug use: No  . Sexual activity: Not Currently  Lifestyle  . Physical activity    Days per week: Not on file    Minutes per session: Not on file  . Stress: Not on file  Relationships  . Social Herbalist on phone: Not on file    Gets together: Not on file    Attends religious service: Not on file    Active member of club or organization: Not on file    Attends meetings of clubs or organizations: Not on file    Relationship status: Not on file  . Intimate partner violence    Fear of current or ex partner: Not on file    Emotionally abused: Not on file    Physically abused: Not on file    Forced sexual activity: Not on file  Other Topics Concern  . Not on file  Social History Narrative  . Not on file    Family History: Family History  Problem Relation Age of Onset  .  Cancer Mother   . Mitral valve prolapse Mother   . Diabetes Father   . Hypertension Father   . Coronary artery disease Sister   . Stroke Brother   . Coronary artery disease Sister      Review of Systems: All other systems reviewed and are otherwise negative except as noted above.  Physical Exam: Vitals:   08/20/19 1158  BP: (!) 144/90  Pulse: 90  Weight: 161 lb (73 kg)  Height: 5' 6.5" (1.689 m)     GEN-  The patient is well appearing, alert and oriented x 3 today.   HEENT: normocephalic, atraumatic; sclera clear, conjunctiva pink; hearing intact; oropharynx clear; neck supple  Lungs- Clear to ausculation bilaterally, normal work of breathing.  No wheezes, rales, rhonchi Heart- Regular rate and rhythm, no murmurs, rubs or gallops  GI- soft, non-tender, non-distended, bowel sounds present  Extremities- no clubbing, cyanosis, or edema  MS- no significant deformity or atrophy Skin- warm and dry, no rash or lesion; PPM pocket well healed Psych- euthymic mood, full affect Neuro- strength and sensation are intact  PPM Interrogation- reviewed in detail today,  See PACEART report  EKG:  EKG is not ordered today.  Recent Labs: 09/02/2018: TSH 4.876 07/31/2019: BUN 32; Creatinine, Ser 1.16; Hemoglobin 14.7; Platelets 142; Potassium 4.3; Sodium 143   Wt Readings from Last 3 Encounters:  08/20/19 161 lb (73 kg)  07/31/19 162 lb (73.5 kg)  07/11/19 159 lb (72.1 kg)     Other studies Reviewed: Additional studies/ records that were reviewed today include: Most recent labs  (07/31/2019), Echo 11/2017 (LVEF 55-60%), Most recent office notes, previous device interrogations.   Assessment and Plan:  1.  Tachy-brady syndrome s/p Medtronic PPM  Normal PPM function See Pace Art report No changes today  2. PAFib CHA2DS2VASC is 6 on eliquis, appropriately dosed Afib burden 7.7% which is improvement from 9% earlier this month.  Increase Toprol to 50 mg daily for increased rate control.  Continue diltiazem and lopressor  3. HTN Regiment as above  4. Falls Continue to monitor for appropriateness of [place].   Current medicines are reviewed at length with the patient today.   The patient does not have concerns regarding her medicines.  The following changes were made today:  Increase toprol  Labs/ tests ordered today include:  No orders of the defined types were placed in this encounter.  Disposition:   Follow up with EP APP in 3 months for additional rate/rhythm control consideration if needed.   Jacalyn Lefevre, PA-C  08/20/2019 2:14 PM  [address] [place] [address] [phone] (office) [phone] (fax)

## 2019-08-20 ENCOUNTER — Ambulatory Visit (INDEPENDENT_AMBULATORY_CARE_PROVIDER_SITE_OTHER): Payer: Medicare Other | Admitting: Student

## 2019-08-20 ENCOUNTER — Other Ambulatory Visit: Payer: Self-pay

## 2019-08-20 VITALS — BP 144/90 | HR 90 | Ht 66.5 in | Wt 161.0 lb

## 2019-08-20 DIAGNOSIS — Z79899 Other long term (current) drug therapy: Secondary | ICD-10-CM | POA: Diagnosis not present

## 2019-08-20 DIAGNOSIS — Z950 Presence of cardiac pacemaker: Secondary | ICD-10-CM

## 2019-08-20 DIAGNOSIS — I480 Paroxysmal atrial fibrillation: Secondary | ICD-10-CM

## 2019-08-20 DIAGNOSIS — I10 Essential (primary) hypertension: Secondary | ICD-10-CM | POA: Diagnosis not present

## 2019-08-20 DIAGNOSIS — I495 Sick sinus syndrome: Secondary | ICD-10-CM

## 2019-08-20 LAB — CUP PACEART INCLINIC DEVICE CHECK
Battery Remaining Longevity: 149 mo
Battery Voltage: 3.02 V
Brady Statistic AP VP Percent: 0.05 %
Brady Statistic AP VS Percent: 92.67 %
Brady Statistic AS VP Percent: 0.01 %
Brady Statistic AS VS Percent: 7.31 %
Brady Statistic RA Percent Paced: 85.95 %
Brady Statistic RV Percent Paced: 0.59 %
Date Time Interrogation Session: 20200923141841
Implantable Lead Implant Date: 20061019
Implantable Lead Implant Date: 20061019
Implantable Lead Location: 753859
Implantable Lead Location: 753860
Implantable Lead Model: 5076
Implantable Pulse Generator Implant Date: 20180611
Lead Channel Impedance Value: 342 Ohm
Lead Channel Impedance Value: 418 Ohm
Lead Channel Impedance Value: 646 Ohm
Lead Channel Impedance Value: 703 Ohm
Lead Channel Pacing Threshold Amplitude: 0.5 V
Lead Channel Pacing Threshold Amplitude: 0.75 V
Lead Channel Pacing Threshold Pulse Width: 0.4 ms
Lead Channel Pacing Threshold Pulse Width: 0.4 ms
Lead Channel Sensing Intrinsic Amplitude: 1.375 mV
Lead Channel Sensing Intrinsic Amplitude: 1.375 mV
Lead Channel Sensing Intrinsic Amplitude: 3.375 mV
Lead Channel Sensing Intrinsic Amplitude: 3.5 mV
Lead Channel Setting Pacing Amplitude: 1.5 V
Lead Channel Setting Pacing Amplitude: 2.5 V
Lead Channel Setting Pacing Pulse Width: 0.4 ms
Lead Channel Setting Sensing Sensitivity: 0.9 mV

## 2019-08-20 MED ORDER — METOPROLOL SUCCINATE ER 50 MG PO TB24: 50.0000 mg | ORAL_TABLET | Freq: Every day | ORAL | 3 refills | Status: DC

## 2019-08-20 NOTE — Patient Instructions (Signed)
Medication Instructions:  START TAKING METOPROLOL 50 MG ONCE A DAY   If you need a refill on your cardiac medications before your next appointment, please call your pharmacy.   Lab work: NONE ORDERED  TODAY   If you have labs (blood work) drawn today and your tests are completely normal, you will receive your results only by: Marland Kitchen MyChart Message (if you have MyChart) OR . A paper copy in the mail If you have any lab test that is abnormal or we need to change your treatment, we will call you to review the results.  Testing/Procedures: NONE ORDERED  TODAY'  Follow-Up: At [facility], you and your health needs are our priority.  As part of our continuing mission to provide you with exceptional heart care, we have created designated Provider Care Teams.  These Care Teams include your primary Cardiologist (physician) and Advanced Practice Providers (APPs -  Physician Assistants and Nurse Practitioners) who all work together to provide you with the care you need, when you need it. You will need a follow up appointment in 3 months. [name] July PA-C   Any Other Special Instructions Will Be Listed Below (If Applicable).

## 2019-09-09 DIAGNOSIS — Z23 Encounter for immunization: Secondary | ICD-10-CM | POA: Diagnosis not present

## 2019-09-25 DIAGNOSIS — E7800 Pure hypercholesterolemia, unspecified: Secondary | ICD-10-CM | POA: Diagnosis not present

## 2019-09-25 DIAGNOSIS — N1831 Chronic kidney disease, stage 3a: Secondary | ICD-10-CM | POA: Diagnosis not present

## 2019-09-25 DIAGNOSIS — J449 Chronic obstructive pulmonary disease, unspecified: Secondary | ICD-10-CM | POA: Diagnosis not present

## 2019-09-25 DIAGNOSIS — I480 Paroxysmal atrial fibrillation: Secondary | ICD-10-CM | POA: Diagnosis not present

## 2019-10-28 NOTE — Progress Notes (Signed)
Patient ID: [name] [name], female   DOB: 1932-09-30, 85 y.o.   MRN: [id number]     [name] [name] is a 85 y.o. female f/u PAF, PPM and chronic anticoagulation Seen most recently by afib clinic and Dr Caryl Comes Was on propafenone but had neuro toxicity with higher doses and now only on cardizem and lopressor  CHAS2VASC 6  Had symptomatic bradycardia and SSS Medtronic pacer placed. She is now on Eliquis 5 bid for anticoagulation   TTE reviewed from 12/02/17 EF 55-60%  Mild AR, mild LAE     Fell in back yard July 2019 and broke some ribs on left side had small left apical pneumo December 2019 fell and broke wrist right. July 2020 fall with chest wall trauma  Per EP afib burden < 7% improved   Walking [name] [name] her dog Still getting some symptomatic palpitations Don't think post conversion pauses are causing her falls     Past Medical History:  Diagnosis Date  . Arthritis   . CVA    Lacunar and cerebellar infarct  . Depression   . DVT (deep venous thrombosis) ([place])    Details not available in the chart  . Edema   . Mitral valve regurgitation    a. echo 2009 mild to moderate MR  . Paroxysmal atrial fibrillation (HCC)   . Pneumonia    as a child  . Presence of permanent cardiac pacemaker   . Sinoatrial node dysfunction (HCC)   . Unspecified essential hypertension     Past Surgical History:  Procedure Laterality Date  . CLOSED REDUCTION ULNAR SHAFT Right 11/15/2018   Procedure: CLOSED REDUCTION RIGHT DISTAL ULNAR FRACTURE;  Surgeon: Charlotte Crumb, MD;  Location: [facility];  Service: Orthopedics;  Laterality: Right;  . OPEN REDUCTION INTERNAL FIXATION (ORIF) DISTAL RADIAL FRACTURE Right 11/15/2018   Procedure: OPEN REDUCTION INTERNAL FIXATION (ORIF) RIGHT DISTAL RADIAL FRACTURE;  Surgeon: Charlotte Crumb, MD;  Location: [facility];  Service: Orthopedics;  Laterality: Right;  . PACEMAKER INSERTION  09-14-2005   MDT EnRhythm dual chamber pacemaker implanted by Dr Lovena Le for SND  .  PPM GENERATOR CHANGEOUT N/A 05/07/2017   Procedure: PPM Generator Changeout;  Surgeon: Deboraha Sprang, MD;  Location: [facility];  Service: Cardiovascular;  Laterality: N/A;  . TONSILLECTOMY      Current Outpatient Medications  Medication Sig Dispense Refill  . allopurinol (ZYLOPRIM) 100 MG tablet Take 100 mg by mouth daily.    . Alpha-D-Galactosidase (BEANO) TABS Take 1 tablet by mouth 3 (three) times daily before meals.    Marland Kitchen Apoaequorin (PREVAGEN) 10 MG CAPS Take 10 mg by mouth daily.     . Cholecalciferol (VITAMIN D3) 2000 UNITS TABS Take 2,000 Units by mouth daily.     Marland Kitchen desonide (DESOWEN) 0.05 % ointment Apply 1 application topically 2 (two) times daily as needed (for irritation).     Marland Kitchen diltiazem (CARDIZEM) 30 MG tablet TAKE 1 TABLET ONCE DAILY AS NEEDED FOR PALPATATION. 90 tablet 0  . ELIQUIS 5 MG TABS tablet TAKE 1 TABLET BY MOUTH TWICE DAILY. 60 tablet 5  . ibuprofen (ADVIL,MOTRIN) 400 MG tablet Take 1 tablet (400 mg total) by mouth every 6 (six) hours as needed for fever or mild pain. 30 tablet 0  . Lactase (LACTAID PO) Take 1 tablet by mouth 3 (three) times daily before meals.    Marland Kitchen loratadine (CLARITIN) 10 MG tablet Take 1 tablet (10 mg total) by mouth daily. For allergies, congestion (Patient taking differently: Take  10 mg by mouth daily. ) 30 tablet 3  . LORazepam (ATIVAN) 0.5 MG tablet Take 0.5 mg by mouth daily as needed for anxiety or sleep.     . Magnesium Oxide 400 (240 Mg) MG TABS TAKE 1 TABLET ONCE DAILY. 90 tablet 1  . metoprolol succinate (TOPROL-XL) 50 MG 24 hr tablet Take 1 tablet (50 mg total) by mouth daily. 90 tablet 3  . Polyvinyl Alcohol-Povidone (REFRESH OP) Place 1 drop into both eyes 2 (two) times daily as needed (for dry eyes).     . sodium chloride (OCEAN) 0.65 % SOLN nasal spray Place 1 spray into both nostrils as needed for congestion.    . traMADol (ULTRAM) 50 MG tablet Take 1 tablet (50 mg total) by mouth every 6 (six) hours as needed. (Patient  taking differently: Take 50 mg by mouth every 6 (six) hours as needed for moderate pain. ) 12 tablet 0  . diltiazem (CARDIZEM CD) 120 MG 24 hr capsule Take 1 capsule (120 mg total) by mouth 2 (two) times daily. 180 capsule 1   No current facility-administered medications for this visit.     Allergies  Allergen Reactions  . Gluten Meal Diarrhea  . Itraconazole Diarrhea and Itching  . Lactose Intolerance (Gi) Diarrhea and Nausea Only  . Statins Other (See Comments)    Myalgias    Review of Systems negative except from HPI and PMH  Physical Exam BP 110/72   Pulse 81   Ht 5' 6.5" (1.689 m)   Wt 162 lb (73.5 kg)   SpO2 95%   BMI 25.76 kg/m  Well developed and well nourished in no acute distress HENT normal with some left facial bruising  E scleral and icterus clear Neck Supple JVP flat; carotids brisk and full Clear to ausculation Regular rate and rhythm, no murmurs gallops or rub Soft with active bowel sounds No clubbing cyanosis  edema Alert and oriented, grossly normal motor and sensory function Skin Warm and Dry  ECG:  AV pacing rate 79 RBBB 09/02/18    Assessment and  Plan  Atrial fibrillation-paroxysmal :  Still seems to have significant burden ? Multaq as alternative AAT Will see Dr Caryl Comes next week She has failed propofenone and amiodarone Given age not sure that Ablation is appropriate although she is very "young" for her age   Hypertension :  Well controlled.  Continue current medications and low sodium Dash type diet.    Elevated TSH: f/U Dr Felipa Eth on amiodarone should check at least twice/year    Pacemaker-Medtronic normal function per PACE ART f/u Dr Caryl Comes   RBBB:  Chronic no change in ECG    Falls:  Frequent with trauma to chest, ribs and wrists she indicates these are mechanical falls    Jenkins Rouge

## 2019-10-31 ENCOUNTER — Ambulatory Visit (INDEPENDENT_AMBULATORY_CARE_PROVIDER_SITE_OTHER): Payer: Medicare Other | Admitting: Cardiovascular Disease

## 2019-10-31 ENCOUNTER — Other Ambulatory Visit: Payer: Self-pay

## 2019-10-31 ENCOUNTER — Encounter: Payer: Self-pay | Admitting: Cardiovascular Disease

## 2019-10-31 VITALS — BP 110/72 | HR 81 | Ht 66.5 in | Wt 162.0 lb

## 2019-10-31 DIAGNOSIS — I480 Paroxysmal atrial fibrillation: Secondary | ICD-10-CM

## 2019-10-31 NOTE — Patient Instructions (Signed)
Medication Instructions:   *If you need a refill on your cardiac medications before your next appointment, please call your pharmacy*  Lab Work:  If you have labs (blood work) drawn today and your tests are completely normal, you will receive your results only by: Marland Kitchen MyChart Message (if you have MyChart) OR . A paper copy in the mail If you have any lab test that is abnormal or we need to change your treatment, we will call you to review the results.  Testing/Procedures: None ordered today.  Follow-Up: At [facility], you and your health needs are our priority.  As part of our continuing mission to provide you with exceptional heart care, we have created designated Provider Care Teams.  These Care Teams include your primary Cardiologist (physician) and Advanced Practice Providers (APPs -  Physician Assistants and Nurse Practitioners) who all work together to provide you with the care you need, when you need it.  Your physician recommends that you schedule a follow-up appointment in: 6 month with Dr. Johnsie Cancel.

## 2019-11-06 ENCOUNTER — Ambulatory Visit (INDEPENDENT_AMBULATORY_CARE_PROVIDER_SITE_OTHER): Payer: Medicare Other | Admitting: *Deleted

## 2019-11-06 DIAGNOSIS — Z950 Presence of cardiac pacemaker: Secondary | ICD-10-CM

## 2019-11-06 LAB — CUP PACEART REMOTE DEVICE CHECK
Battery Remaining Longevity: 145 mo
Battery Voltage: 3.02 V
Brady Statistic AP VP Percent: 0.05 %
Brady Statistic AP VS Percent: 97.89 %
Brady Statistic AS VP Percent: 0 %
Brady Statistic AS VS Percent: 2.1 %
Brady Statistic RA Percent Paced: 89.84 %
Brady Statistic RV Percent Paced: 0.94 %
Date Time Interrogation Session: 20201209231614
Implantable Lead Implant Date: 20061019
Implantable Lead Implant Date: 20061019
Implantable Lead Location: 753859
Implantable Lead Location: 753860
Implantable Lead Model: 5076
Implantable Pulse Generator Implant Date: 20180611
Lead Channel Impedance Value: 323 Ohm
Lead Channel Impedance Value: 399 Ohm
Lead Channel Impedance Value: 570 Ohm
Lead Channel Impedance Value: 646 Ohm
Lead Channel Pacing Threshold Amplitude: 0.625 V
Lead Channel Pacing Threshold Amplitude: 0.875 V
Lead Channel Pacing Threshold Pulse Width: 0.4 ms
Lead Channel Pacing Threshold Pulse Width: 0.4 ms
Lead Channel Sensing Intrinsic Amplitude: 2.375 mV
Lead Channel Sensing Intrinsic Amplitude: 2.375 mV
Lead Channel Sensing Intrinsic Amplitude: 3 mV
Lead Channel Sensing Intrinsic Amplitude: 3 mV
Lead Channel Setting Pacing Amplitude: 1.5 V
Lead Channel Setting Pacing Amplitude: 2.5 V
Lead Channel Setting Pacing Pulse Width: 0.4 ms
Lead Channel Setting Sensing Sensitivity: 0.9 mV

## 2019-11-18 ENCOUNTER — Other Ambulatory Visit: Payer: Self-pay | Admitting: Internal Medicine

## 2019-11-19 ENCOUNTER — Other Ambulatory Visit: Payer: Self-pay

## 2019-11-19 ENCOUNTER — Ambulatory Visit (INDEPENDENT_AMBULATORY_CARE_PROVIDER_SITE_OTHER): Payer: Medicare Other | Admitting: Student

## 2019-11-19 ENCOUNTER — Encounter: Payer: Self-pay | Admitting: Student

## 2019-11-19 VITALS — BP 112/70 | HR 76 | Ht 66.0 in | Wt 158.8 lb

## 2019-11-19 DIAGNOSIS — I495 Sick sinus syndrome: Secondary | ICD-10-CM

## 2019-11-19 DIAGNOSIS — Z79899 Other long term (current) drug therapy: Secondary | ICD-10-CM

## 2019-11-19 DIAGNOSIS — I10 Essential (primary) hypertension: Secondary | ICD-10-CM | POA: Diagnosis not present

## 2019-11-19 DIAGNOSIS — I480 Paroxysmal atrial fibrillation: Secondary | ICD-10-CM

## 2019-11-19 LAB — CUP PACEART INCLINIC DEVICE CHECK
Battery Remaining Longevity: 146 mo
Battery Voltage: 3.01 V
Brady Statistic AP VP Percent: 0.05 %
Brady Statistic AP VS Percent: 97.32 %
Brady Statistic AS VP Percent: 0 %
Brady Statistic AS VS Percent: 2.67 %
Brady Statistic RA Percent Paced: 89.55 %
Brady Statistic RV Percent Paced: 0.76 %
Date Time Interrogation Session: 20201223125724
Implantable Lead Implant Date: 20061019
Implantable Lead Implant Date: 20061019
Implantable Lead Location: 753859
Implantable Lead Location: 753860
Implantable Lead Model: 5076
Implantable Pulse Generator Implant Date: 20180611
Lead Channel Impedance Value: 361 Ohm
Lead Channel Impedance Value: 437 Ohm
Lead Channel Impedance Value: 684 Ohm
Lead Channel Impedance Value: 741 Ohm
Lead Channel Pacing Threshold Amplitude: 0.625 V
Lead Channel Pacing Threshold Amplitude: 0.75 V
Lead Channel Pacing Threshold Pulse Width: 0.4 ms
Lead Channel Pacing Threshold Pulse Width: 0.4 ms
Lead Channel Sensing Intrinsic Amplitude: 2.75 mV
Lead Channel Sensing Intrinsic Amplitude: 2.75 mV
Lead Channel Sensing Intrinsic Amplitude: 3 mV
Lead Channel Sensing Intrinsic Amplitude: 3.375 mV
Lead Channel Setting Pacing Amplitude: 1.5 V
Lead Channel Setting Pacing Amplitude: 2.5 V
Lead Channel Setting Pacing Pulse Width: 0.4 ms
Lead Channel Setting Sensing Sensitivity: 0.9 mV

## 2019-11-19 NOTE — Patient Instructions (Signed)
Medication Instructions:  Your physician recommends that you continue on your current medications as directed. Please refer to the Current Medication list given to you today.  *If you need a refill on your cardiac medications before your next appointment, please call your pharmacy*  Lab Work: [place]   If you have labs (blood work) drawn today and your tests are completely normal, you will receive your results only by: Marland Kitchen MyChart Message (if you have MyChart) OR . A paper copy in the mail If you have any lab test that is abnormal or we need to change your treatment, we will call you to review the results.  Testing/Procedures: NONE ORDERED  TODAY   Follow-Up: At [facility], you and your health needs are our priority.  As part of our continuing mission to provide you with exceptional heart care, we have created designated Provider Care Teams.  These Care Teams include your primary Cardiologist (physician) and Advanced Practice Providers (APPs -  Physician Assistants and Nurse Practitioners) who all work together to provide you with the care you need, when you need it.  Your next appointment:   6 months   The format for your next appointment:   In Person  Provider:   You may see Virl Axe, MD or one of the following Advanced Practice Providers on your designated Care Team:    Chanetta Marshall, NP  Tommye Standard, [place]  Legrand Como "Oda Kilts, [place]   Other Instructions

## 2019-11-19 NOTE — Progress Notes (Signed)
Electrophysiology Office Note Date: 11/19/2019  ID:  [name] [name], DOB 27-Dec-1931, MRN [id number]  PCP: Lajean Manes, MD Primary Cardiologist: Virl Axe, MD Electrophysiologist: Dr. Caryl Comes   CC: Pacemaker follow-up  [name] [name] is a 85 y.o. female with a history of PAFib, tachy-brady w/PPM, HTN, CVA, and DVT seen today for Dr. Caryl Comes. she presents today for routine electrophysiology followup.  Since last being seen in our clinic, the patient reports doing very well.  she denies chest pain, dyspnea, PND, orthopnea, nausea, vomiting, dizziness, syncope, edema, weight gain, or early satiety. She occasionally has palpitations, but they are not long standing, limiting, or highly symptomatic.   Device History: Medtronic Dual Chamber PPM implanted 08/2005 -> gen change 05/07/17 for tachy-brady  Past Medical History:  Diagnosis Date  . Arthritis   . CVA    Lacunar and cerebellar infarct  . Depression   . DVT (deep venous thrombosis) ([place])    Details not available in the chart  . Edema   . Mitral valve regurgitation    a. echo 2009 mild to moderate MR  . Paroxysmal atrial fibrillation (HCC)   . Pneumonia    as a child  . Presence of permanent cardiac pacemaker   . Sinoatrial node dysfunction (HCC)   . Unspecified essential hypertension    Past Surgical History:  Procedure Laterality Date  . CLOSED REDUCTION ULNAR SHAFT Right 11/15/2018   Procedure: CLOSED REDUCTION RIGHT DISTAL ULNAR FRACTURE;  Surgeon: Charlotte Crumb, MD;  Location: [facility];  Service: Orthopedics;  Laterality: Right;  . OPEN REDUCTION INTERNAL FIXATION (ORIF) DISTAL RADIAL FRACTURE Right 11/15/2018   Procedure: OPEN REDUCTION INTERNAL FIXATION (ORIF) RIGHT DISTAL RADIAL FRACTURE;  Surgeon: Charlotte Crumb, MD;  Location: [facility];  Service: Orthopedics;  Laterality: Right;  . PACEMAKER INSERTION  09-14-2005   MDT EnRhythm dual chamber pacemaker implanted by Dr Lovena Le for SND  . PPM GENERATOR  CHANGEOUT N/A 05/07/2017   Procedure: PPM Generator Changeout;  Surgeon: Deboraha Sprang, MD;  Location: [facility];  Service: Cardiovascular;  Laterality: N/A;  . TONSILLECTOMY      Current Outpatient Medications  Medication Sig Dispense Refill  . allopurinol (ZYLOPRIM) 100 MG tablet Take 100 mg by mouth daily.    . Alpha-D-Galactosidase (BEANO) TABS Take 1 tablet by mouth 3 (three) times daily before meals.    Marland Kitchen Apoaequorin (PREVAGEN) 10 MG CAPS Take 10 mg by mouth daily.     . Cholecalciferol (VITAMIN D3) 2000 UNITS TABS Take 2,000 Units by mouth daily.     Marland Kitchen desonide (DESOWEN) 0.05 % ointment Apply 1 application topically 2 (two) times daily as needed (for irritation).     Marland Kitchen diltiazem (CARDIZEM CD) 120 MG 24 hr capsule Take 1 capsule (120 mg total) by mouth 2 (two) times daily. 180 capsule 1  . diltiazem (CARDIZEM) 30 MG tablet TAKE 1 TABLET ONCE DAILY AS NEEDED FOR PALPATATION. 90 tablet 0  . ELIQUIS 5 MG TABS tablet TAKE 1 TABLET BY MOUTH TWICE DAILY. 60 tablet 5  . Lactase (LACTAID PO) Take 1 tablet by mouth 3 (three) times daily before meals.    Marland Kitchen loratadine (CLARITIN) 10 MG tablet Take 1 tablet (10 mg total) by mouth daily. For allergies, congestion 30 tablet 3  . LORazepam (ATIVAN) 0.5 MG tablet Take 0.5 mg by mouth daily as needed for anxiety or sleep.     . magnesium oxide (MAG-OX) 400 MG tablet TAKE 1 TABLET ONCE DAILY. 90 tablet 0  .  metoprolol succinate (TOPROL-XL) 50 MG 24 hr tablet Take 1 tablet (50 mg total) by mouth daily. 90 tablet 3  . Polyvinyl Alcohol-Povidone (REFRESH OP) Place 1 drop into both eyes 2 (two) times daily as needed (for dry eyes).     . sodium chloride (OCEAN) 0.65 % SOLN nasal spray Place 1 spray into both nostrils as needed for congestion.     No current facility-administered medications for this visit.    Allergies:   Gluten meal, Itraconazole, Lactose intolerance (gi), and Statins   Social History: Social History   Socioeconomic History    . Marital status: Widowed    Spouse name: Not on file  . Number of children: Not on file  . Years of education: Not on file  . Highest education level: Not on file  Occupational History  . Not on file  Tobacco Use  . Smoking status: Former Smoker    Quit date: 08/13/1984    Years since quitting: 35.2  . Smokeless tobacco: Never Used  Substance and Sexual Activity  . Alcohol use: Yes    Alcohol/week: 14.0 standard drinks    Types: 14 Glasses of wine per week  . Drug use: No  . Sexual activity: Not Currently  Other Topics Concern  . Not on file  Social History Narrative  . Not on file   Social Determinants of Health   Financial Resource Strain:   . Difficulty of Paying Living Expenses: Not on file  Food Insecurity:   . Worried About Charity fundraiser in the Last Year: Not on file  . Ran Out of Food in the Last Year: Not on file  Transportation Needs:   . Lack of Transportation (Medical): Not on file  . Lack of Transportation (Non-Medical): Not on file  Physical Activity:   . Days of Exercise per Week: Not on file  . Minutes of Exercise per Session: Not on file  Stress:   . Feeling of Stress : Not on file  Social Connections:   . Frequency of Communication with Friends and Family: Not on file  . Frequency of Social Gatherings with Friends and Family: Not on file  . Attends Religious Services: Not on file  . Active Member of Clubs or Organizations: Not on file  . Attends Archivist Meetings: Not on file  . Marital Status: Not on file  Intimate Partner Violence:   . Fear of Current or Ex-Partner: Not on file  . Emotionally Abused: Not on file  . Physically Abused: Not on file  . Sexually Abused: Not on file    Family History: Family History  Problem Relation Age of Onset  . Cancer Mother   . Mitral valve prolapse Mother   . Diabetes Father   . Hypertension Father   . Coronary artery disease Sister   . Stroke Brother   . Coronary artery disease  Sister      Review of Systems: All other systems reviewed and are otherwise negative except as noted above.  Physical Exam: Vitals:   11/19/19 1146  BP: 112/70  Pulse: 76  SpO2: 94%  Weight: 158 lb 12.8 oz (72 kg)  Height: 5\' 6"  (1.676 m)     GEN- The patient is well appearing, alert and oriented x 3 today.   HEENT: normocephalic, atraumatic; sclera clear, conjunctiva pink; hearing intact; oropharynx clear; neck supple  Lungs- Clear to ausculation bilaterally, normal work of breathing.  No wheezes, rales, rhonchi Heart- Regular rate and rhythm, no  murmurs, rubs or gallops  GI- soft, non-tender, non-distended, bowel sounds present  Extremities- no clubbing, cyanosis, or edema  MS- no significant deformity or atrophy Skin- warm and dry, no rash or lesion; PPM pocket well healed Psych- euthymic mood, full affect Neuro- strength and sensation are intact  PPM Interrogation- reviewed in detail today,  See PACEART report  EKG:  EKG is not ordered today. Device reviewed  Recent Labs: 07/31/2019: BUN 32; Creatinine, Ser 1.16; Hemoglobin 14.7; Platelets 142; Potassium 4.3; Sodium 143   Wt Readings from Last 3 Encounters:  11/19/19 158 lb 12.8 oz (72 kg)  10/31/19 162 lb (73.5 kg)  08/20/19 161 lb (73 kg)     Other studies Reviewed: Additional studies/ records that were reviewed today include: Echo 11/2017 shows LVEF 55-60%, Previous EP office notes, Previous remote checks, Most recent labwork.   Assessment and Plan:  1. Tachybradycardia syndrome s/p Medtronic PPM  Normal PPM function See Pace Art report No changes today  2. PAFib CHA2DS2VASC of 7 on Eliquis, appropriately dosed   Burden 8.7% by interrogation 11/05/2019, 8.4% today which is relatively stable.  Continue toprol 50 mg daily Continue diltiazem 120 mg BID and 30 mg (short acting) as needed.   3. HTN Meds as above  4. Falls Continue to monitor for appropriateness of [place].    Current medicines are reviewed at  length with the patient today.   The patient does not have concerns regarding her medicines.  The following changes were made today:  none  Labs/ tests ordered today include:  No orders of the defined types were placed in this encounter.  Disposition:   Follow up with Dr. Caryl Comes in 6 Months   Signed, Annamaria Helling  11/19/2019 11:57 AM  [facility] [address] [place] [address] [phone] (office) [phone] (fax)

## 2019-12-13 NOTE — Progress Notes (Signed)
PPM remote 

## 2020-02-05 ENCOUNTER — Ambulatory Visit (INDEPENDENT_AMBULATORY_CARE_PROVIDER_SITE_OTHER): Payer: Medicare Other | Admitting: *Deleted

## 2020-02-05 DIAGNOSIS — Z950 Presence of cardiac pacemaker: Secondary | ICD-10-CM | POA: Diagnosis not present

## 2020-02-05 LAB — CUP PACEART REMOTE DEVICE CHECK
Battery Remaining Longevity: 142 mo
Battery Voltage: 3.01 V
Brady Statistic AP VP Percent: 0.05 %
Brady Statistic AP VS Percent: 86.03 %
Brady Statistic AS VP Percent: 0.01 %
Brady Statistic AS VS Percent: 14.13 %
Brady Statistic RA Percent Paced: 49.29 %
Brady Statistic RV Percent Paced: 3.23 %
Date Time Interrogation Session: 20210310210627
Implantable Lead Implant Date: 20061019
Implantable Lead Implant Date: 20061019
Implantable Lead Location: 753859
Implantable Lead Location: 753860
Implantable Lead Model: 5076
Implantable Pulse Generator Implant Date: 20180611
Lead Channel Impedance Value: 361 Ohm
Lead Channel Impedance Value: 418 Ohm
Lead Channel Impedance Value: 551 Ohm
Lead Channel Impedance Value: 627 Ohm
Lead Channel Pacing Threshold Amplitude: 0.625 V
Lead Channel Pacing Threshold Amplitude: 0.75 V
Lead Channel Pacing Threshold Pulse Width: 0.4 ms
Lead Channel Pacing Threshold Pulse Width: 0.4 ms
Lead Channel Sensing Intrinsic Amplitude: 2.875 mV
Lead Channel Sensing Intrinsic Amplitude: 2.875 mV
Lead Channel Sensing Intrinsic Amplitude: 3.75 mV
Lead Channel Sensing Intrinsic Amplitude: 3.75 mV
Lead Channel Setting Pacing Amplitude: 1.5 V
Lead Channel Setting Pacing Amplitude: 2.5 V
Lead Channel Setting Pacing Pulse Width: 0.4 ms
Lead Channel Setting Sensing Sensitivity: 0.9 mV

## 2020-02-06 NOTE — Progress Notes (Signed)
PPM Remote  

## 2020-03-01 ENCOUNTER — Other Ambulatory Visit: Payer: Self-pay | Admitting: Cardiovascular Disease

## 2020-03-02 ENCOUNTER — Other Ambulatory Visit: Payer: Self-pay | Admitting: Cardiovascular Disease

## 2020-03-02 NOTE — Telephone Encounter (Signed)
Eliquis 5mg  refill request received. Pt is 86 years old, weight-72kg, Crea-1.16 on 07/31/2019, Diagnosis-Afib, and last seen by [place], [place] on 11/19/2019. Dose is appropriate based on dosing criteria. Will send in refill to requested pharmacy.

## 2020-03-02 NOTE — Telephone Encounter (Signed)
Pt last saw Barrington Ellison, [place] on 11/19/19, last labs 07/31/19 Creat 1.16, age 86, weight 72kg, based on specified criteria pt is on appropriate dosage of Eliquis 5mg  BID.  Will refill rx.

## 2020-03-23 ENCOUNTER — Other Ambulatory Visit: Payer: Self-pay | Admitting: Internal Medicine

## 2020-04-06 ENCOUNTER — Other Ambulatory Visit: Payer: Self-pay | Admitting: Geriatric Medicine

## 2020-04-06 DIAGNOSIS — N1831 Chronic kidney disease, stage 3a: Secondary | ICD-10-CM | POA: Diagnosis not present

## 2020-04-06 DIAGNOSIS — R0602 Shortness of breath: Secondary | ICD-10-CM | POA: Diagnosis not present

## 2020-04-06 DIAGNOSIS — R042 Hemoptysis: Secondary | ICD-10-CM | POA: Diagnosis not present

## 2020-04-06 DIAGNOSIS — J449 Chronic obstructive pulmonary disease, unspecified: Secondary | ICD-10-CM | POA: Diagnosis not present

## 2020-04-06 DIAGNOSIS — Z79899 Other long term (current) drug therapy: Secondary | ICD-10-CM | POA: Diagnosis not present

## 2020-04-06 DIAGNOSIS — I480 Paroxysmal atrial fibrillation: Secondary | ICD-10-CM | POA: Diagnosis not present

## 2020-04-14 ENCOUNTER — Other Ambulatory Visit: Payer: Self-pay | Admitting: Physician Assistant

## 2020-04-20 ENCOUNTER — Ambulatory Visit
Admission: RE | Admit: 2020-04-20 | Discharge: 2020-04-20 | Disposition: A | Discharge status: Home / Self Care | Payer: Medicare Other | Source: Ambulatory Visit | Attending: Geriatric Medicine | Admitting: Geriatric Medicine

## 2020-04-20 ENCOUNTER — Other Ambulatory Visit: Payer: Medicare Other

## 2020-04-20 DIAGNOSIS — J984 Other disorders of lung: Secondary | ICD-10-CM | POA: Diagnosis not present

## 2020-04-20 DIAGNOSIS — R0602 Shortness of breath: Secondary | ICD-10-CM

## 2020-04-20 DIAGNOSIS — J432 Centrilobular emphysema: Secondary | ICD-10-CM | POA: Diagnosis not present

## 2020-04-20 DIAGNOSIS — N1831 Chronic kidney disease, stage 3a: Secondary | ICD-10-CM | POA: Diagnosis not present

## 2020-04-20 MED ORDER — IOPAMIDOL (ISOVUE-300) INJECTION 61%
60.0000 mL | Freq: Once | INTRAVENOUS | Status: AC | PRN
  Administered 2020-04-20: 60 mL via INTRAVENOUS

## 2020-05-06 ENCOUNTER — Ambulatory Visit (INDEPENDENT_AMBULATORY_CARE_PROVIDER_SITE_OTHER): Payer: Medicare Other | Admitting: *Deleted

## 2020-05-06 DIAGNOSIS — I495 Sick sinus syndrome: Secondary | ICD-10-CM | POA: Diagnosis not present

## 2020-05-06 LAB — CUP PACEART REMOTE DEVICE CHECK
Battery Remaining Longevity: 139 mo
Battery Voltage: 3.01 V
Brady Statistic AP VP Percent: 0.06 %
Brady Statistic AP VS Percent: 88.5 %
Brady Statistic AS VP Percent: 0.01 %
Brady Statistic AS VS Percent: 11.67 %
Brady Statistic RA Percent Paced: 46.82 %
Brady Statistic RV Percent Paced: 4 %
Date Time Interrogation Session: 20210609215608
Implantable Lead Implant Date: 20061019
Implantable Lead Implant Date: 20061019
Implantable Lead Location: 753859
Implantable Lead Location: 753860
Implantable Lead Model: 5076
Implantable Pulse Generator Implant Date: 20180611
Lead Channel Impedance Value: 323 Ohm
Lead Channel Impedance Value: 399 Ohm
Lead Channel Impedance Value: 570 Ohm
Lead Channel Impedance Value: 627 Ohm
Lead Channel Pacing Threshold Amplitude: 0.625 V
Lead Channel Pacing Threshold Amplitude: 0.875 V
Lead Channel Pacing Threshold Pulse Width: 0.4 ms
Lead Channel Pacing Threshold Pulse Width: 0.4 ms
Lead Channel Sensing Intrinsic Amplitude: 1.5 mV
Lead Channel Sensing Intrinsic Amplitude: 1.5 mV
Lead Channel Sensing Intrinsic Amplitude: 3.125 mV
Lead Channel Sensing Intrinsic Amplitude: 3.125 mV
Lead Channel Setting Pacing Amplitude: 1.5 V
Lead Channel Setting Pacing Amplitude: 2.5 V
Lead Channel Setting Pacing Pulse Width: 0.4 ms
Lead Channel Setting Sensing Sensitivity: 0.9 mV

## 2020-05-07 NOTE — Progress Notes (Signed)
Remote pacemaker transmission.   

## 2020-06-02 ENCOUNTER — Ambulatory Visit: Payer: Medicare Other | Admitting: Cardiovascular Disease

## 2020-06-06 IMAGING — CT CT CHEST W/ CM
2 of 4 series · 9 of 36 positions shown, 11 images · IV contrast (iopamidol)
Comparison: CT chest 05/18/2006

CLINICAL DATA: Cough and occasional shortness of breath, symptoms
for several months, no history of malignancy

EXAM:
CT CHEST WITH CONTRAST
TECHNIQUE: Multidetector CT imaging of the chest was performed during
intravenous contrast administration.
CONTRAST:  60mL UYWQH4-SEE IOPAMIDOL (UYWQH4-SEE) INJECTION 61%

[Series 2: chest 2.00 br40 s3 · axial · 0.54mm/px · z∈[+1453,+1657]mm · 6 of 134 slices shown, 8 images (1 of 2)]
[im 21/134  mediastinal]
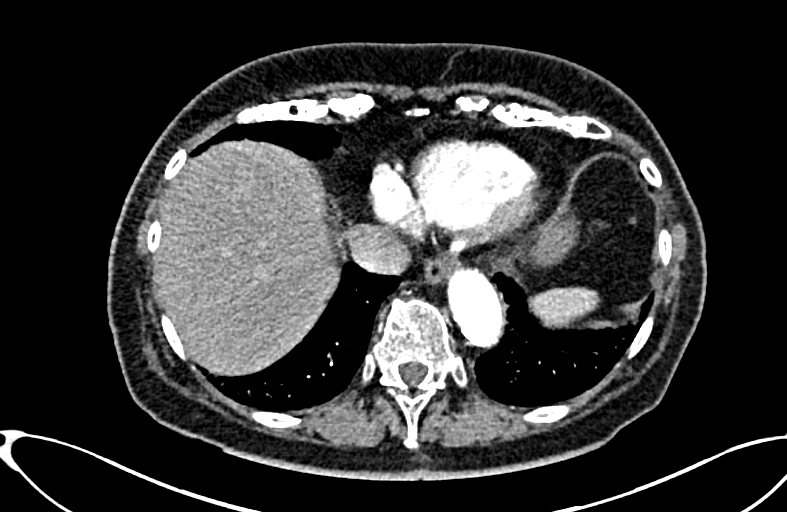
[im 21/134  lung]
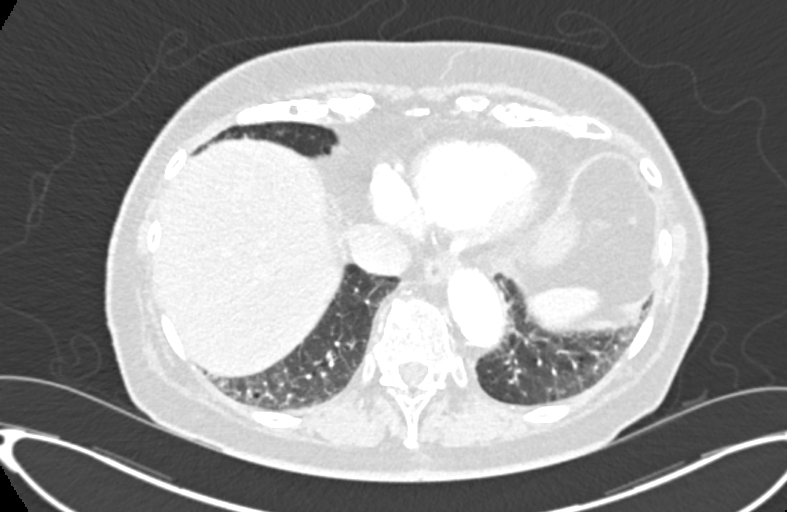
[im 41/134  lung]
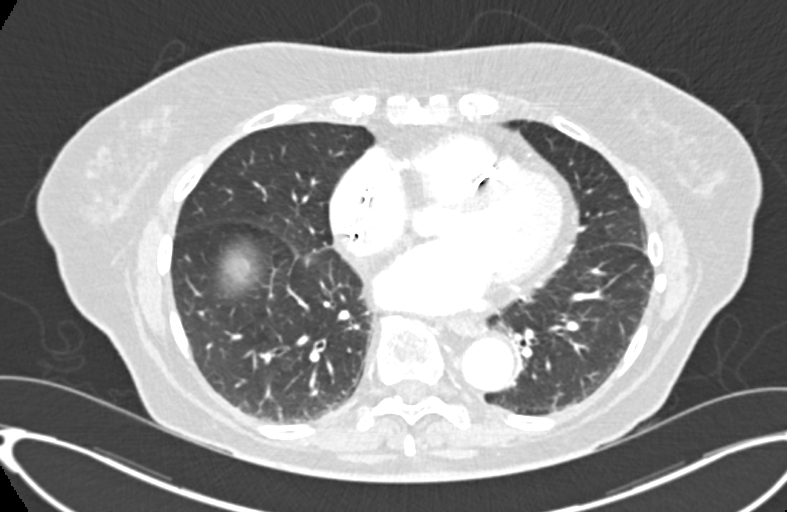
[im 62/134  lung]
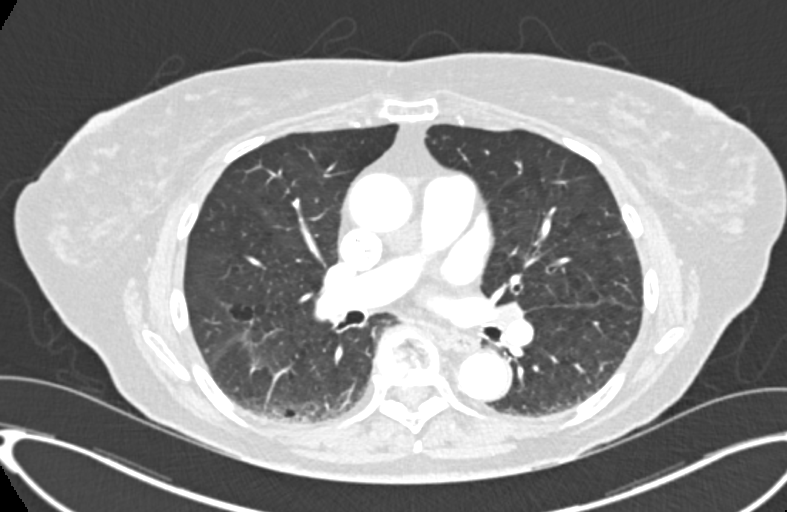
[im 82/134  lung]
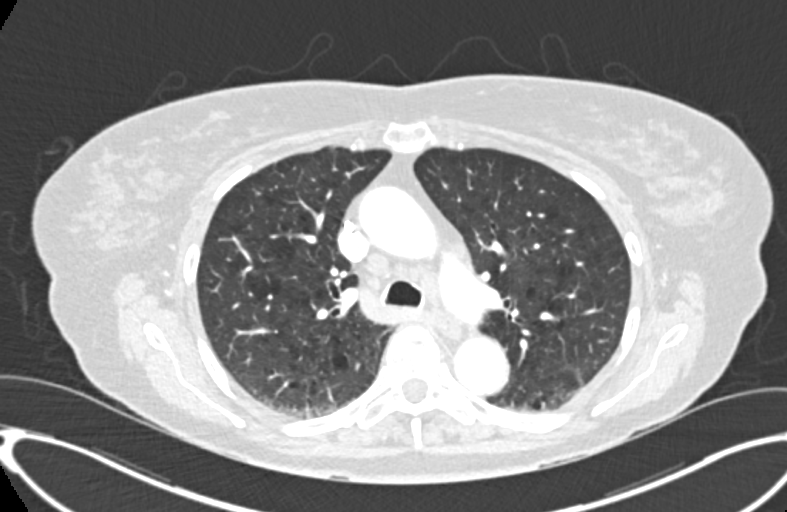
[im 103/134  mediastinal]
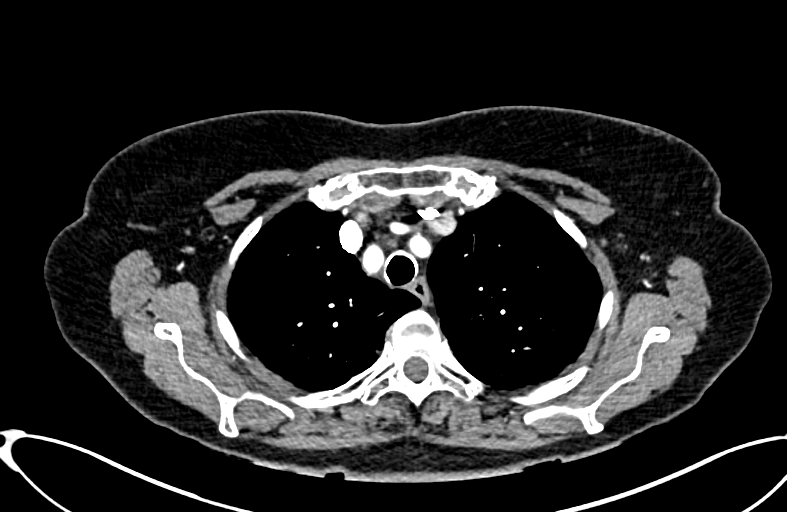
[im 103/134  lung]
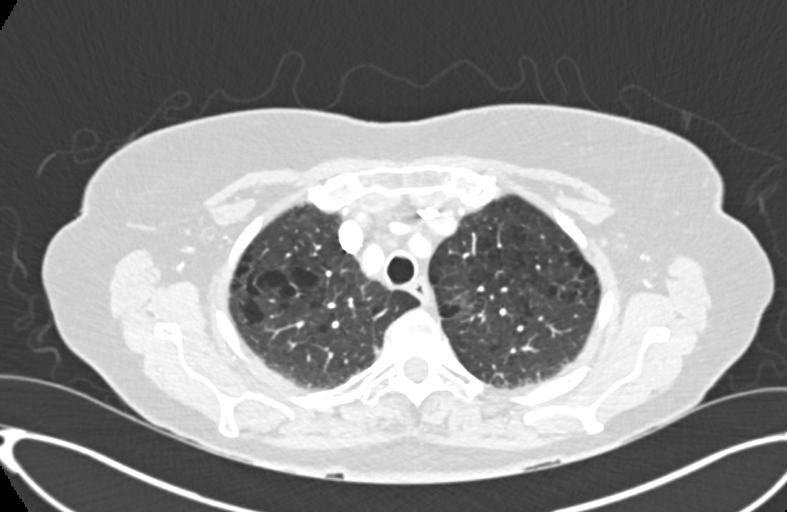
[im 123/134  lung]
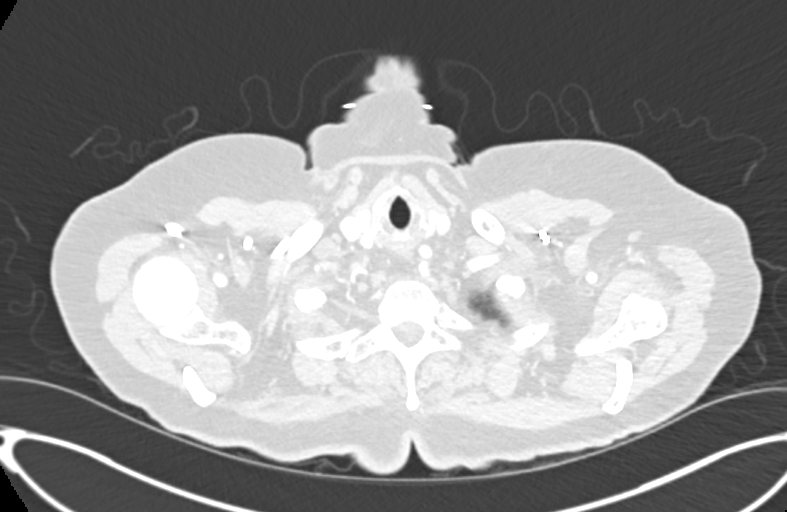

[Series 4: chest 2.00 br40 s3 · coronal · 0.52mm/px · 3 of 138 slices shown (2 of 2)]
[im 28/138  lung]
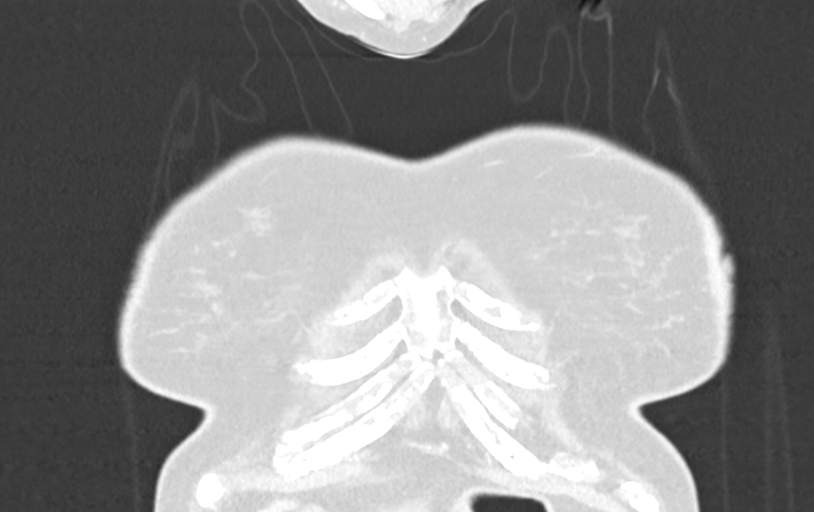
[im 55/138  lung]
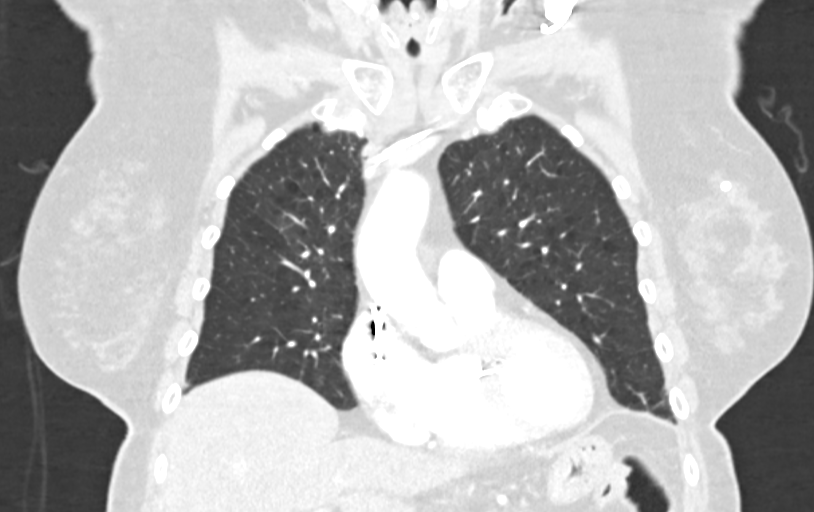
[im 83/138  lung]
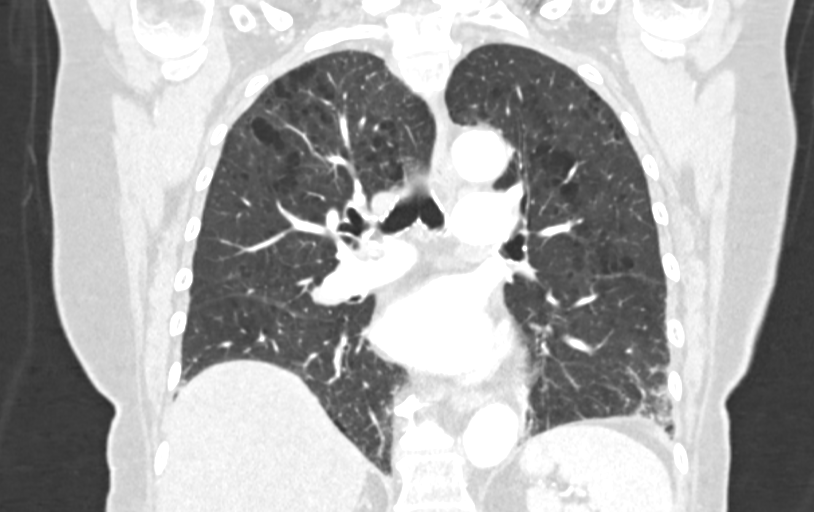

[9 of 36 positions shown; findings below may reference images not displayed]

FINDINGS: Cardiovascular: The heart is enlarged. No pericardial effusion.
Extensive three-vessel coronary artery calcifications are present. A
left chest wall pacer pack is present with leads at the right atrium
and cardiac apex. Atherosclerotic plaque within the normal caliber
aorta. Normal 3 vessel branching of the aortic arch. Mild tortuosity
of the brachiocephalic vasculature on the right. Central pulmonary
arteries are upper limits normal caliber. No central or lobar
filling defects on this non tailored examination of the pulmonary
arteries.

Mediastinum/Nodes: Small amount of fluid in the pericardial
recesses. There are several clustered subcentimeter paratracheal
lymph nodes. The largest is a precarinal lymph node measuring up to
11 mm in size. Similar appearance was present in 0774. No enlarged
hilar adenopathy or significant axillary adenopathy. Thyroid gland
and thoracic inlet are unremarkable. No acute abnormality of the
trachea or esophagus.

Lungs/Pleura: Extensive centrilobular and paraseptal emphysematous
changes are again seen throughout the chest. There is diffuse mild
airways thickening and scattered secretions. There is a stable area
reticular change in the left lower lobe and lingula adjacent the
fissure which likely reflect some scarring and architectural
distortion. Mild mosaic attenuation likely reflect some underlying
air trapping/small airways disease. Fall no focal consolidation,
pneumothorax or effusion. No convincing evidence of edema at this
time. There is a 8 mm nodule in the anterior segment right upper
lobe (8/60). No other concerning nodules or masses.

Upper Abdomen: No acute abnormalities present in the visualized
portions of the upper abdomen.

Musculoskeletal: The osseous structures appear diffusely
demineralized which may limit detection of small or nondisplaced
fractures. Several remote left-sided rib fractures are present. No
definite acute rib fractures. No suspicious osseous lesions. Stable
benign macro calcification present in the left breast. No worrisome
chest wall abnormalities.
IMPRESSION: 1. Severe centrilobular emphysematous change and findings of chronic
bronchitis, may correlate for clinical features of COPD. May reflect
smoking related changes.
2. Numerous prominent mediastinal lymph nodes including several
borderline enlarged nodes are similar to comparison study and
overall nonspecific, possibly reactive to the chronic findings
above.
3. Chronic scarring and architectural distortion in the left base
and lingula, possibly post infectious or inflammatory.
4. 8 mm nodule in the right upper lobe. Non-contrast chest CT at
6-12 months is recommended. If the nodule is stable at time of
repeat CT, then future CT at 18-24 months (from today's scan) is
considered optional for low-risk patients, but is recommended for
high-risk patients. This recommendation follows the consensus
statement: Guidelines for Management of Incidental Pulmonary Nodules
Detected on CT Images: From the [HOSPITAL] 7289; Radiology
5. Cardiomegaly.  Coronary artery disease.
6. Aortic Atherosclerosis (U0WBJ-HCO.O).

## 2020-07-06 NOTE — Progress Notes (Signed)
Electrophysiology Office Note Date: 07/14/2020  ID:  [name] [name], DOB [birth year], MRN [id number]  PCP: Lajean Manes, MD Primary Cardiologist: Johnsie Cancel Electrophysiologist: Dr. Caryl Comes   CC: PPM/PAF   [name] [name] is a 86 y.o. female with a history of PAFib, tachy-brady w/PPM, HTN, CVA, and DVT Since last being seen in our clinic, the patient reports doing very well.  she denies chest pain, dyspnea, PND, orthopnea, nausea, vomiting, dizziness, syncope, edema, weight gain, or early satiety. She occasionally has palpitations, but they are not long standing, limiting, or highly symptomatic.   Thinking of getting another dog Plays piano at [facility]  Dr Caryl Comes saw earlier this week and ordered echo    Device History: Medtronic Dual Chamber PPM implanted 08/2005 -> gen change 05/07/17 for tachy-brady  Past Medical History:  Diagnosis Date  . Arthritis   . CVA    Lacunar and cerebellar infarct  . Depression   . DVT (deep venous thrombosis) ([place])    Details not available in the chart  . Edema   . Mitral valve regurgitation    a. echo 2009 mild to moderate MR  . Paroxysmal atrial fibrillation (HCC)   . Pneumonia    as a child  . Presence of permanent cardiac pacemaker   . Sinoatrial node dysfunction (HCC)   . Unspecified essential hypertension    Past Surgical History:  Procedure Laterality Date  . CLOSED REDUCTION ULNAR SHAFT Right 11/15/2018   Procedure: CLOSED REDUCTION RIGHT DISTAL ULNAR FRACTURE;  Surgeon: Charlotte Crumb, MD;  Location: [facility];  Service: Orthopedics;  Laterality: Right;  . OPEN REDUCTION INTERNAL FIXATION (ORIF) DISTAL RADIAL FRACTURE Right 11/15/2018   Procedure: OPEN REDUCTION INTERNAL FIXATION (ORIF) RIGHT DISTAL RADIAL FRACTURE;  Surgeon: Charlotte Crumb, MD;  Location: [facility];  Service: Orthopedics;  Laterality: Right;  . PACEMAKER INSERTION  09-14-2005   MDT EnRhythm dual chamber pacemaker implanted by Dr Lovena Le  for SND  . PPM GENERATOR CHANGEOUT N/A 05/07/2017   Procedure: PPM Generator Changeout;  Surgeon: Deboraha Sprang, MD;  Location: [facility];  Service: Cardiovascular;  Laterality: N/A;  . TONSILLECTOMY      Current Outpatient Medications  Medication Sig Dispense Refill  . allopurinol (ZYLOPRIM) 100 MG tablet Take 100 mg by mouth daily.    . Alpha-D-Galactosidase (BEANO) TABS Take 1 tablet by mouth 3 (three) times daily before meals.    Marland Kitchen Apoaequorin (PREVAGEN) 10 MG CAPS Take 10 mg by mouth daily.     . Cholecalciferol (VITAMIN D3) 2000 UNITS TABS Take 2,000 Units by mouth daily.     Marland Kitchen desonide (DESOWEN) 0.05 % ointment Apply 1 application topically 2 (two) times daily as needed (for irritation).     Marland Kitchen diltiazem (CARDIZEM CD) 120 MG 24 hr capsule TAKE (1) CAPSULE TWICE DAILY. 180 capsule 1  . diltiazem (CARDIZEM) 30 MG tablet TAKE 1 TABLET ONCE DAILY AS NEEDED FOR PALPATATION. 90 tablet 0  . ELIQUIS 5 MG TABS tablet TAKE 1 TABLET BY MOUTH TWICE DAILY. 60 tablet 5  . Lactase (LACTAID PO) Take 1 tablet by mouth 3 (three) times daily before meals.    Marland Kitchen loratadine (CLARITIN) 10 MG tablet Take 1 tablet (10 mg total) by mouth daily. For allergies, congestion 30 tablet 3  . LORazepam (ATIVAN) 0.5 MG tablet Take 0.5 mg by mouth daily as needed for anxiety or sleep.     . magnesium oxide (MAG-OX) 400 MG tablet Take 1 tablet (400 mg total)  by mouth daily. 90 tablet 1  . metoprolol succinate (TOPROL-XL) 50 MG 24 hr tablet Take 1 tablet (50 mg total) by mouth daily. 90 tablet 3  . Polyvinyl Alcohol-Povidone (REFRESH OP) Place 1 drop into both eyes 2 (two) times daily as needed (for dry eyes).     . sodium chloride (OCEAN) 0.65 % SOLN nasal spray Place 1 spray into both nostrils as needed for congestion.     No current facility-administered medications for this visit.    Allergies:   Gluten meal, Itraconazole, Lactose intolerance (gi), and Statins   Social History: Social History    Socioeconomic History  . Marital status: Widowed    Spouse name: Not on file  . Number of children: Not on file  . Years of education: Not on file  . Highest education level: Not on file  Occupational History  . Not on file  Tobacco Use  . Smoking status: Former Smoker    Quit date: 08/13/1984    Years since quitting: 35.9  . Smokeless tobacco: Never Used  Vaping Use  . Vaping Use: Never used  Substance and Sexual Activity  . Alcohol use: Yes    Alcohol/week: 14.0 standard drinks    Types: 14 Glasses of wine per week  . Drug use: No  . Sexual activity: Not Currently  Other Topics Concern  . Not on file  Social History Narrative  . Not on file   Social Determinants of Health   Financial Resource Strain:   . Difficulty of Paying Living Expenses:   Food Insecurity:   . Worried About Charity fundraiser in the Last Year:   . Arboriculturist in the Last Year:   Transportation Needs:   . Film/video editor (Medical):   Marland Kitchen Lack of Transportation (Non-Medical):   Physical Activity:   . Days of Exercise per Week:   . Minutes of Exercise per Session:   Stress:   . Feeling of Stress :   Social Connections:   . Frequency of Communication with Friends and Family:   . Frequency of Social Gatherings with Friends and Family:   . Attends Religious Services:   . Active Member of Clubs or Organizations:   . Attends Archivist Meetings:   Marland Kitchen Marital Status:   Intimate Partner Violence:   . Fear of Current or Ex-Partner:   . Emotionally Abused:   Marland Kitchen Physically Abused:   . Sexually Abused:     Family History: Family History  Problem Relation Age of Onset  . Cancer Mother   . Mitral valve prolapse Mother   . Diabetes Father   . Hypertension Father   . Coronary artery disease Sister   . Stroke Brother   . Coronary artery disease Sister      Review of Systems: All other systems reviewed and are otherwise negative except as noted above.  Physical  Exam: Vitals:   07/14/20 0952  BP: 110/80  Pulse: 92  SpO2: 97%  Weight: 149 lb (67.6 kg)  Height: 5\' 7"  (1.702 m)     Affect appropriate Healthy:  appears stated age HEENT: normal Neck supple with no adenopathy JVP normal no bruits no thyromegaly Lungs clear with no wheezing and good diaphragmatic motion Heart:  S1/S2 no murmur, no rub, gallop or click PMI normal PPM under left clavicle  Abdomen: benighn, BS positve, no tenderness, no AAA no bruit.  No HSM or HJR Distal pulses intact with no bruits No edema Neuro  non-focal Skin warm and dry No muscular weakness   PPM Interrogation- reviewed in detail today,  See PACEART report  EKG:  EKG is not ordered today. Device reviewed  Recent Labs: 07/31/2019: BUN 32; Creatinine, Ser 1.16; Hemoglobin 14.7; Platelets 142; Potassium 4.3; Sodium 143   Wt Readings from Last 3 Encounters:  07/14/20 149 lb (67.6 kg)  07/12/20 146 lb 6.4 oz (66.4 kg)  11/19/19 158 lb 12.8 oz (72 kg)     Other studies Reviewed: Additional studies/ records that were reviewed today include: Echo 11/2017 shows LVEF 55-60%, Previous EP office notes, Previous remote checks, Most recent labwork.   Assessment and Plan:  1. Tachybradycardia syndrome s/p Medtronic PPM - normal function PACE ART reviewed stable   2. PAFib- CHADVASC 7 on eliquis with no bleeding issues low burden PAF on PPM interrogation continue Toprol and cardizem   3. HTN- Well controlled.  Continue current medications and low sodium Dash type diet.     F/U Dr Caryl Comes for [facility] and me in a year   Signed, Jenkins Rouge, MD  07/14/2020 10:05 AM  [place] [address] [phone] (office) [phone] (fax)

## 2020-07-12 ENCOUNTER — Encounter: Payer: Self-pay | Admitting: Internal Medicine

## 2020-07-12 ENCOUNTER — Other Ambulatory Visit: Payer: Self-pay

## 2020-07-12 ENCOUNTER — Ambulatory Visit (INDEPENDENT_AMBULATORY_CARE_PROVIDER_SITE_OTHER): Payer: Medicare Other | Admitting: Internal Medicine

## 2020-07-12 VITALS — BP 116/80 | HR 75 | Ht 67.0 in | Wt 146.4 lb

## 2020-07-12 DIAGNOSIS — I480 Paroxysmal atrial fibrillation: Secondary | ICD-10-CM | POA: Diagnosis not present

## 2020-07-12 DIAGNOSIS — Z950 Presence of cardiac pacemaker: Secondary | ICD-10-CM

## 2020-07-12 DIAGNOSIS — I495 Sick sinus syndrome: Secondary | ICD-10-CM

## 2020-07-12 NOTE — Patient Instructions (Addendum)
Medication Instructions:   **Begin weaning off your Metoprolol 50mg  - Decrease to 25 mg (1/2 tablet) daily x 4 days then 25mg  (1/2 tablet) every other day x 4 days then stop.  *If you need a refill on your cardiac medications before your next appointment, please call your pharmacy*   Lab Work: None ordered.  If you have labs (blood work) drawn today and your tests are completely normal, you will receive your results only by: Marland Kitchen MyChart Message (if you have MyChart) OR . A paper copy in the mail If you have any lab test that is abnormal or we need to change your treatment, we will call you to review the results.   Testing/Procedures: Your physician has requested that you have an echocardiogram. Echocardiography is a painless test that uses sound waves to create images of your heart. It provides your doctor with information about the size and shape of your heart and how well your heart's chambers and valves are working. This procedure takes approximately one hour. There are no restrictions for this procedure.     Follow-Up: At [facility], you and your health needs are our priority.  As part of our continuing mission to provide you with exceptional heart care, we have created designated Provider Care Teams.  These Care Teams include your primary Cardiologist (physician) and Advanced Practice Providers (APPs -  Physician Assistants and Nurse Practitioners) who all work together to provide you with the care you need, when you need it.  We recommend signing up for the patient portal called "MyChart".  Sign up information is provided on this After Visit Summary.  MyChart is used to connect with patients for Virtual Visits (Telemedicine).  Patients are able to view lab/test results, encounter notes, upcoming appointments, etc.  Non-urgent messages can be sent to your provider as well.   To learn more about what you can do with MyChart, go to [email or web address].    Your next appointment:   Friday, 08/20/2020 at 915am with Dr Caryl Comes.

## 2020-07-12 NOTE — Progress Notes (Signed)
HPI  [name] [name] is a 86 y.o. female seen in followup for atrial fibrillation for which she took Rythmol and bradycardia for which she is status post pacemaker implantation 2006. Underwent generator replacement 5/18  She saw Dr. Greggory Brandy 2019; amiodarone was initiated.  Stopped 12/19 because of pulmonary concerns. Issues of rate control were challenging with up titration of diltiazem and concomitant beta-blockers seeming to do reasonably well.  AV junction ablation previously discussed but at that time deferred  Has noted heart rates faster.  No change in exercise tolerance.  No edema.  Biggest issue is diarrhea.  Occasionally soiling.     DATE TEST EF   8/15 Echo   55-65 %   1/19 Echo   55-65 % Mild LAE           Date Cr K Hgb  6/18 0.89  13.6  1/19  1.01 3.5 13.3  9/20 1.16 4.3 42.7    Thromboembolic risk factors ( age  -2, HTN-1 Gender-1) for a CHADSVASc Score of >=4   Past Medical History:  Diagnosis Date  . Arthritis   . CVA    Lacunar and cerebellar infarct  . Depression   . DVT (deep venous thrombosis) ([place])    Details not available in the chart  . Edema   . Mitral valve regurgitation    a. echo 2009 mild to moderate MR  . Paroxysmal atrial fibrillation (HCC)   . Pneumonia    as a child  . Presence of permanent cardiac pacemaker   . Sinoatrial node dysfunction (HCC)   . Unspecified essential hypertension     Past Surgical History:  Procedure Laterality Date  . CLOSED REDUCTION ULNAR SHAFT Right 11/15/2018   Procedure: CLOSED REDUCTION RIGHT DISTAL ULNAR FRACTURE;  Surgeon: Charlotte Crumb, MD;  Location: [facility];  Service: Orthopedics;  Laterality: Right;  . OPEN REDUCTION INTERNAL FIXATION (ORIF) DISTAL RADIAL FRACTURE Right 11/15/2018   Procedure: OPEN REDUCTION INTERNAL FIXATION (ORIF) RIGHT DISTAL RADIAL FRACTURE;  Surgeon: Charlotte Crumb, MD;  Location: [facility];  Service: Orthopedics;  Laterality: Right;  . PACEMAKER INSERTION  09-14-2005    MDT EnRhythm dual chamber pacemaker implanted by Dr Lovena Le for SND  . PPM GENERATOR CHANGEOUT N/A 05/07/2017   Procedure: PPM Generator Changeout;  Surgeon: Deboraha Sprang, MD;  Location: [facility] [facility];  Service: Cardiovascular;  Laterality: N/A;  . TONSILLECTOMY      Current Outpatient Medications  Medication Sig Dispense Refill  . allopurinol (ZYLOPRIM) 100 MG tablet Take 100 mg by mouth daily.    . Alpha-D-Galactosidase (BEANO) TABS Take 1 tablet by mouth 3 (three) times daily before meals.    Marland Kitchen Apoaequorin (PREVAGEN) 10 MG CAPS Take 10 mg by mouth daily.     . Cholecalciferol (VITAMIN D3) 2000 UNITS TABS Take 2,000 Units by mouth daily.     Marland Kitchen desonide (DESOWEN) 0.05 % ointment Apply 1 application topically 2 (two) times daily as needed (for irritation).     Marland Kitchen diltiazem (CARDIZEM CD) 120 MG 24 hr capsule TAKE (1) CAPSULE TWICE DAILY. 180 capsule 1  . diltiazem (CARDIZEM) 30 MG tablet TAKE 1 TABLET ONCE DAILY AS NEEDED FOR PALPATATION. 90 tablet 0  . ELIQUIS 5 MG TABS tablet TAKE 1 TABLET BY MOUTH TWICE DAILY. 60 tablet 5  . Lactase (LACTAID PO) Take 1 tablet by mouth 3 (three) times daily before meals.    Marland Kitchen loratadine (CLARITIN) 10 MG tablet Take 1 tablet (10 mg total) by mouth daily.  For allergies, congestion 30 tablet 3  . LORazepam (ATIVAN) 0.5 MG tablet Take 0.5 mg by mouth daily as needed for anxiety or sleep.     . magnesium oxide (MAG-OX) 400 MG tablet Take 1 tablet (400 mg total) by mouth daily. 90 tablet 1  . metoprolol succinate (TOPROL-XL) 50 MG 24 hr tablet Take 1 tablet (50 mg total) by mouth daily. 90 tablet 3  . Polyvinyl Alcohol-Povidone (REFRESH OP) Place 1 drop into both eyes 2 (two) times daily as needed (for dry eyes).     . sodium chloride (OCEAN) 0.65 % SOLN nasal spray Place 1 spray into both nostrils as needed for congestion.     No current facility-administered medications for this visit.    Allergies  Allergen Reactions  . Gluten Meal Diarrhea  .  Itraconazole Diarrhea and Itching  . Lactose Intolerance (Gi) Diarrhea and Nausea Only  . Statins Other (See Comments)    Myalgias    Review of Systems negative except from HPI and PMH  Physical Exam BP 116/80   Pulse 75   Ht 5\' 7"  (1.702 m)   Wt 146 lb 6.4 oz (66.4 kg)   SpO2 98%   BMI 22.93 kg/m  Well developed and well nourished in no acute distress HENT normal Neck supple with JVP-flat Clear Device pocket well healed; without hematoma or erythema.  There is no tethering  Regular rate and rhythm, no  murmur Abd-soft with active BS No Clubbing cyanosis *  edema Skin-warm and dry A & Oriented  Grossly normal sensory and motor function  ECG AV pacing at 75      Assessment and  Plan  Atrial fibrillation-paroxysmal  Hypertension   Sinus node dysfunction   Right bundle branch block  Pacemaker-Medtronic   Increasingly frequent episodes of atrial fibrillation.  Heart rates range from average over 100 up to 170-80.  Is aware that most of her daytime rates are about 100.  Surprisingly, no significant changes in exercise tolerance.  We will check an echocardiogram to see if there is untoward effects on left ventricular function.  The diarrhea also may be complicating and that it may be a consequence of the metoprolol.  Given its major impact on her life, we will wean the metoprolol and see if the diarrhea abates.  I have also suggested that it could be an inflammatory lymphocytic colitis and Pepto-Bismol may help.  She is to follow-up with her primary care and perhaps find another GI consultation.  We also discussed again AV junction ablation as a potential alternative for rate control.  We will revisit things in about 6 weeks

## 2020-07-14 ENCOUNTER — Ambulatory Visit (INDEPENDENT_AMBULATORY_CARE_PROVIDER_SITE_OTHER): Payer: Medicare Other | Admitting: Cardiovascular Disease

## 2020-07-14 ENCOUNTER — Other Ambulatory Visit: Payer: Self-pay

## 2020-07-14 ENCOUNTER — Telehealth: Payer: Self-pay

## 2020-07-14 ENCOUNTER — Encounter: Payer: Self-pay | Admitting: Cardiovascular Disease

## 2020-07-14 VITALS — BP 110/80 | HR 92 | Ht 67.0 in | Wt 149.0 lb

## 2020-07-14 DIAGNOSIS — I480 Paroxysmal atrial fibrillation: Secondary | ICD-10-CM

## 2020-07-14 NOTE — Patient Instructions (Addendum)

## 2020-07-14 NOTE — Telephone Encounter (Signed)
Carelink alert received 07/13/20 for AF RVR 188 bpm. Seen in office by Dr. Caryl Comes 07/12/20.   Per Dr. Olin Pia note, hear rate averages over 100 up to 170-180. IS aware that most of fatyime rates are about 100. Has apt. Discussed AV junction ablation, checking echo, decreasing metoprolol, decrease to 25 mg (1/2 tablet) daily x 4 days then 25mg  (1/2 tablet) every other day x 4 days then stop. Has apt. today to  See Dr. Johnsie Cancel at 9:30 AM.   Called to assess patient, no answer. LMOVM.   Will forward to Dr. Caryl Comes for update.

## 2020-07-20 NOTE — Telephone Encounter (Signed)
Noted  

## 2020-07-22 ENCOUNTER — Ambulatory Visit (HOSPITAL_COMMUNITY): Payer: Medicare Other | Attending: Cardiology

## 2020-07-22 ENCOUNTER — Other Ambulatory Visit: Payer: Self-pay

## 2020-07-22 DIAGNOSIS — I480 Paroxysmal atrial fibrillation: Secondary | ICD-10-CM | POA: Diagnosis not present

## 2020-07-22 LAB — ECHOCARDIOGRAM COMPLETE: S' Lateral: 2.7 cm

## 2020-07-26 ENCOUNTER — Telehealth: Payer: Self-pay | Admitting: Internal Medicine

## 2020-07-26 NOTE — Telephone Encounter (Signed)
Pt notified her Echo results and verbalized understanding.

## 2020-07-26 NOTE — Telephone Encounter (Signed)
    Pt is returning call from [facility] to get echo result.

## 2020-08-05 ENCOUNTER — Ambulatory Visit (INDEPENDENT_AMBULATORY_CARE_PROVIDER_SITE_OTHER): Payer: Medicare Other | Admitting: *Deleted

## 2020-08-05 DIAGNOSIS — I495 Sick sinus syndrome: Secondary | ICD-10-CM | POA: Diagnosis not present

## 2020-08-05 LAB — CUP PACEART REMOTE DEVICE CHECK
Battery Remaining Longevity: 135 mo
Battery Voltage: 3 V
Brady Statistic AP VP Percent: 0.06 %
Brady Statistic AP VS Percent: 69.85 %
Brady Statistic AS VP Percent: 0.02 %
Brady Statistic AS VS Percent: 30.43 %
Brady Statistic RA Percent Paced: 27.55 %
Brady Statistic RV Percent Paced: 2.1 %
Date Time Interrogation Session: 20210908221942
Implantable Lead Implant Date: 20061019
Implantable Lead Implant Date: 20061019
Implantable Lead Location: 753859
Implantable Lead Location: 753860
Implantable Lead Model: 5076
Implantable Pulse Generator Implant Date: 20180611
Lead Channel Impedance Value: 323 Ohm
Lead Channel Impedance Value: 399 Ohm
Lead Channel Impedance Value: 532 Ohm
Lead Channel Impedance Value: 589 Ohm
Lead Channel Pacing Threshold Amplitude: 0.625 V
Lead Channel Pacing Threshold Amplitude: 0.875 V
Lead Channel Pacing Threshold Pulse Width: 0.4 ms
Lead Channel Pacing Threshold Pulse Width: 0.4 ms
Lead Channel Sensing Intrinsic Amplitude: 1.875 mV
Lead Channel Sensing Intrinsic Amplitude: 1.875 mV
Lead Channel Sensing Intrinsic Amplitude: 3 mV
Lead Channel Sensing Intrinsic Amplitude: 3 mV
Lead Channel Setting Pacing Amplitude: 1.5 V
Lead Channel Setting Pacing Amplitude: 2.5 V
Lead Channel Setting Pacing Pulse Width: 0.4 ms
Lead Channel Setting Sensing Sensitivity: 0.9 mV

## 2020-08-06 NOTE — Progress Notes (Signed)
Remote pacemaker transmission.   

## 2020-08-18 DIAGNOSIS — M109 Gout, unspecified: Secondary | ICD-10-CM | POA: Diagnosis not present

## 2020-08-18 DIAGNOSIS — R197 Diarrhea, unspecified: Secondary | ICD-10-CM | POA: Diagnosis not present

## 2020-08-18 DIAGNOSIS — I4819 Other persistent atrial fibrillation: Secondary | ICD-10-CM | POA: Diagnosis not present

## 2020-08-19 DIAGNOSIS — R197 Diarrhea, unspecified: Secondary | ICD-10-CM | POA: Diagnosis not present

## 2020-08-20 ENCOUNTER — Ambulatory Visit (INDEPENDENT_AMBULATORY_CARE_PROVIDER_SITE_OTHER): Payer: Medicare Other | Admitting: Internal Medicine

## 2020-08-20 ENCOUNTER — Other Ambulatory Visit: Payer: Self-pay

## 2020-08-20 ENCOUNTER — Encounter: Payer: Self-pay | Admitting: Internal Medicine

## 2020-08-20 VITALS — BP 120/74 | HR 72 | Ht 67.0 in | Wt 145.0 lb

## 2020-08-20 DIAGNOSIS — Z950 Presence of cardiac pacemaker: Secondary | ICD-10-CM | POA: Diagnosis not present

## 2020-08-20 DIAGNOSIS — I480 Paroxysmal atrial fibrillation: Secondary | ICD-10-CM | POA: Diagnosis not present

## 2020-08-20 DIAGNOSIS — I495 Sick sinus syndrome: Secondary | ICD-10-CM | POA: Diagnosis not present

## 2020-08-20 LAB — CUP PACEART INCLINIC DEVICE CHECK
Battery Remaining Longevity: 131 mo
Battery Voltage: 3 V
Brady Statistic AP VP Percent: 0.06 %
Brady Statistic AP VS Percent: 59.97 %
Brady Statistic AS VP Percent: 0.04 %
Brady Statistic AS VS Percent: 40.27 %
Brady Statistic RA Percent Paced: 22.91 %
Brady Statistic RV Percent Paced: 2.13 %
Date Time Interrogation Session: 20210924134345
Implantable Lead Implant Date: 20061019
Implantable Lead Implant Date: 20061019
Implantable Lead Location: 753859
Implantable Lead Location: 753860
Implantable Lead Model: 5076
Implantable Pulse Generator Implant Date: 20180611
Lead Channel Impedance Value: 323 Ohm
Lead Channel Impedance Value: 418 Ohm
Lead Channel Impedance Value: 589 Ohm
Lead Channel Impedance Value: 646 Ohm
Lead Channel Pacing Threshold Amplitude: 1 V
Lead Channel Pacing Threshold Amplitude: 1 V
Lead Channel Pacing Threshold Pulse Width: 0.4 ms
Lead Channel Pacing Threshold Pulse Width: 0.4 ms
Lead Channel Sensing Intrinsic Amplitude: 3 mV
Lead Channel Sensing Intrinsic Amplitude: 3.25 mV
Lead Channel Setting Pacing Amplitude: 2 V
Lead Channel Setting Pacing Amplitude: 2.5 V
Lead Channel Setting Pacing Pulse Width: 0.4 ms
Lead Channel Setting Sensing Sensitivity: 0.9 mV

## 2020-08-20 MED ORDER — METOPROLOL SUCCINATE ER 50 MG PO TB24
50.0000 mg | ORAL_TABLET | Freq: Every day | ORAL | 3 refills | Status: DC
Start: 2020-08-20 — End: 2020-11-11

## 2020-08-20 NOTE — Progress Notes (Signed)
HPI  [name] [name] is a 86 y.o. female seen in followup for atrial fibrillation for which she took Rythmol and bradycardia for which she is status post pacemaker implantation 2006. Underwent generator replacement 5/18  She saw Dr. Greggory Brandy 2019; amiodarone was initiated.  Stopped 12/19 because of pulmonary concerns. Issues of rate control were challenging with up titration of diltiazem and concomitant beta-blockers seeming to do reasonably well.  AV junction ablation previously discussed but at that time deferred    Has noted heart rates faster.  No change in exercise tolerance.  No edema.  Biggest issue is diarrhea.  Occasionally soiling.  This improved following discontinuation of the metoprolol but also with concomitant addition of Pepto-Bismol.  Unfortunately aware atrial fibrillation.  GI evaluation is ongoing consideration of whether the magnesium and the allopurinol could be stopped.     DATE TEST EF   8/15 Echo   55-65 %   1/19 Echo   55-65 % Mild LAE  8/21 Echo  55-60%       Date Cr K Hgb  6/18 0.89  13.6  1/19  1.01 3.5 13.3  9/20 1.16 4.3 74.0    Thromboembolic risk factors ( age  -2, HTN-1 Gender-1) for a CHADSVASc Score of >=4   Past Medical History:  Diagnosis Date  . Arthritis   . CVA    Lacunar and cerebellar infarct  . Depression   . DVT (deep venous thrombosis) ([place])    Details not available in the chart  . Edema   . Mitral valve regurgitation    a. echo 2009 mild to moderate MR  . Paroxysmal atrial fibrillation (HCC)   . Pneumonia    as a child  . Presence of permanent cardiac pacemaker   . Sinoatrial node dysfunction (HCC)   . Unspecified essential hypertension     Past Surgical History:  Procedure Laterality Date  . CLOSED REDUCTION ULNAR SHAFT Right 11/15/2018   Procedure: CLOSED REDUCTION RIGHT DISTAL ULNAR FRACTURE;  Surgeon: Charlotte Crumb, MD;  Location: [facility];  Service: Orthopedics;  Laterality: Right;  . OPEN REDUCTION  INTERNAL FIXATION (ORIF) DISTAL RADIAL FRACTURE Right 11/15/2018   Procedure: OPEN REDUCTION INTERNAL FIXATION (ORIF) RIGHT DISTAL RADIAL FRACTURE;  Surgeon: Charlotte Crumb, MD;  Location: [place] [place];  Service: Orthopedics;  Laterality: Right;  . PACEMAKER INSERTION  09-14-2005   MDT EnRhythm dual chamber pacemaker implanted by Dr Lovena Le for SND  . PPM GENERATOR CHANGEOUT N/A 05/07/2017   Procedure: PPM Generator Changeout;  Surgeon: Deboraha Sprang, MD;  Location: [place] [facility];  Service: Cardiovascular;  Laterality: N/A;  . TONSILLECTOMY      Current Outpatient Medications  Medication Sig Dispense Refill  . allopurinol (ZYLOPRIM) 100 MG tablet Take 100 mg by mouth daily.    . Alpha-D-Galactosidase (BEANO) TABS Take 1 tablet by mouth 3 (three) times daily before meals.    Marland Kitchen Apoaequorin (PREVAGEN) 10 MG CAPS Take 10 mg by mouth daily.     . Cholecalciferol (VITAMIN D3) 2000 UNITS TABS Take 2,000 Units by mouth daily.     Marland Kitchen desonide (DESOWEN) 0.05 % ointment Apply 1 application topically 2 (two) times daily as needed (for irritation).     Marland Kitchen diltiazem (CARDIZEM CD) 120 MG 24 hr capsule TAKE (1) CAPSULE TWICE DAILY. 180 capsule 1  . diltiazem (CARDIZEM) 30 MG tablet TAKE 1 TABLET ONCE DAILY AS NEEDED FOR PALPATATION. 90 tablet 0  . ELIQUIS 5 MG TABS tablet TAKE 1 TABLET BY MOUTH  TWICE DAILY. 60 tablet 5  . Lactase (LACTAID PO) Take 1 tablet by mouth 3 (three) times daily before meals.    Marland Kitchen loratadine (CLARITIN) 10 MG tablet Take 1 tablet (10 mg total) by mouth daily. For allergies, congestion 30 tablet 3  . LORazepam (ATIVAN) 0.5 MG tablet Take 0.5 mg by mouth daily as needed for anxiety or sleep.     . magnesium oxide (MAG-OX) 400 MG tablet Take 1 tablet (400 mg total) by mouth daily. 90 tablet 1  . Polyvinyl Alcohol-Povidone (REFRESH OP) Place 1 drop into both eyes 2 (two) times daily as needed (for dry eyes).     . sodium chloride (OCEAN) 0.65 % SOLN nasal spray Place 1 spray into both  nostrils as needed for congestion.     No current facility-administered medications for this visit.    Allergies  Allergen Reactions  . Gluten Meal Diarrhea  . Itraconazole Diarrhea and Itching  . Lactose Intolerance (Gi) Diarrhea and Nausea Only  . Statins Other (See Comments)    Myalgias    Review of Systems negative except from HPI and PMH  Physical Exam BP 120/74   Pulse 72   Ht 5\' 7"  (1.702 m)   Wt 145 lb (65.8 kg)   SpO2 98%   BMI 22.71 kg/m  Well developed and well nourished in no acute distress HENT normal Neck supple with JVP-flat Clear Device pocket well healed; without hematoma or erythema.  There is no tethering  Regular rate and rhythm, no murmur Abd-soft with active BS No Clubbing cyanosis  edema Skin-warm and dry A & Oriented  Grossly normal sensory and motor function  ECG atrial pacing at 71 Intervals 15/11/44 Incomplete right bundle branch block       Assessment and  Plan  Atrial fibrillation-paroxysmal  Hypertension   Sinus node dysfunction   Right bundle branch block  Pacemaker-Medtronic   Diarrhea question cause  Persistent atrial fibrillation with an increase in the average rate following the holding of the metoprolol such that her 60% atrial fibrillation burden had an average heart rate of over about 110-20.  Given the fact that her diarrhea improved off the metoprolol but concomitant with the Pepto-Bismol, we will resume the metoprolol and see if the diarrhea recurs as we needed for augmented heart rate control.  We will plan to touch base with her about 2 weeks to assess the diarrhea status and to check her blood pressure.  At that point if it allows we would increase her metoprolol from 50--100 mg daily.  We also discussed the possibility of changing her diltiazem--verapamil as the latter is associated with more tendency towards constipation.  We will hold off on the discussion of AV junction ablation for now.  Blood pressure  reasonably controlled

## 2020-08-20 NOTE — Patient Instructions (Signed)
Medication Instructions:  ** Stop Allopurinol  ** Stop Magnesium Oxide  ** Resume Metoprolol Succinate 50mg  - 1 tablet daily by mouth with meals.  *If you need a refill on your cardiac medications before your next appointment, please call your pharmacy*   Lab Work: None ordered.  If you have labs (blood work) drawn today and your tests are completely normal, you will receive your results only by: Marland Kitchen MyChart Message (if you have MyChart) OR . A paper copy in the mail If you have any lab test that is abnormal or we need to change your treatment, we will call you to review the results.   Testing/Procedures: None ordered.    Follow-Up: At [facility], you and your health needs are our priority.  As part of our continuing mission to provide you with exceptional heart care, we have created designated Provider Care Teams.  These Care Teams include your primary Cardiologist (physician) and Advanced Practice Providers (APPs -  Physician Assistants and Nurse Practitioners) who all work together to provide you with the care you need, when you need it.  We recommend signing up for the patient portal called "MyChart".  Sign up information is provided on this After Visit Summary.  MyChart is used to connect with patients for Virtual Visits (Telemedicine).  Patients are able to view lab/test results, encounter notes, upcoming appointments, etc.  Non-urgent messages can be sent to your provider as well.   To learn more about what you can do with MyChart, go to [email or web address].    Your next appointment:   6 month(s)  The format for your next appointment:   In Person  Provider:   Virl Axe, MD   Other Instructions I will call you in about 2 weeks to follow up with blood pressure and symptom of diarrhea.

## 2020-09-03 DIAGNOSIS — Z23 Encounter for immunization: Secondary | ICD-10-CM | POA: Diagnosis not present

## 2020-09-13 DIAGNOSIS — M109 Gout, unspecified: Secondary | ICD-10-CM | POA: Diagnosis not present

## 2020-09-13 DIAGNOSIS — I4819 Other persistent atrial fibrillation: Secondary | ICD-10-CM | POA: Diagnosis not present

## 2020-09-13 DIAGNOSIS — R197 Diarrhea, unspecified: Secondary | ICD-10-CM | POA: Diagnosis not present

## 2020-10-11 ENCOUNTER — Other Ambulatory Visit: Payer: Self-pay | Admitting: Geriatric Medicine

## 2020-10-11 DIAGNOSIS — I4819 Other persistent atrial fibrillation: Secondary | ICD-10-CM | POA: Diagnosis not present

## 2020-10-11 DIAGNOSIS — M109 Gout, unspecified: Secondary | ICD-10-CM | POA: Diagnosis not present

## 2020-10-11 DIAGNOSIS — R911 Solitary pulmonary nodule: Secondary | ICD-10-CM

## 2020-10-11 DIAGNOSIS — R197 Diarrhea, unspecified: Secondary | ICD-10-CM | POA: Diagnosis not present

## 2020-11-04 ENCOUNTER — Ambulatory Visit (INDEPENDENT_AMBULATORY_CARE_PROVIDER_SITE_OTHER): Payer: Medicare Other

## 2020-11-04 DIAGNOSIS — I495 Sick sinus syndrome: Secondary | ICD-10-CM

## 2020-11-05 ENCOUNTER — Telehealth: Payer: Self-pay

## 2020-11-05 LAB — CUP PACEART REMOTE DEVICE CHECK
Battery Remaining Longevity: 132 mo
Battery Voltage: 3 V
Brady Statistic AP VP Percent: 0.05 %
Brady Statistic AP VS Percent: 61.3 %
Brady Statistic AS VP Percent: 0.03 %
Brady Statistic AS VS Percent: 38.95 %
Brady Statistic RA Percent Paced: 23.3 %
Brady Statistic RV Percent Paced: 3.31 %
Date Time Interrogation Session: 20211208224547
Implantable Lead Implant Date: 20061019
Implantable Lead Implant Date: 20061019
Implantable Lead Location: 753859
Implantable Lead Location: 753860
Implantable Lead Model: 5076
Implantable Pulse Generator Implant Date: 20180611
Lead Channel Impedance Value: 304 Ohm
Lead Channel Impedance Value: 380 Ohm
Lead Channel Impedance Value: 532 Ohm
Lead Channel Impedance Value: 570 Ohm
Lead Channel Pacing Threshold Amplitude: 0.625 V
Lead Channel Pacing Threshold Amplitude: 0.875 V
Lead Channel Pacing Threshold Pulse Width: 0.4 ms
Lead Channel Pacing Threshold Pulse Width: 0.4 ms
Lead Channel Sensing Intrinsic Amplitude: 1.75 mV
Lead Channel Sensing Intrinsic Amplitude: 1.75 mV
Lead Channel Sensing Intrinsic Amplitude: 3.25 mV
Lead Channel Sensing Intrinsic Amplitude: 3.25 mV
Lead Channel Setting Pacing Amplitude: 1.5 V
Lead Channel Setting Pacing Amplitude: 2.5 V
Lead Channel Setting Pacing Pulse Width: 0.4 ms
Lead Channel Setting Sensing Sensitivity: 0.9 mV

## 2020-11-05 NOTE — Telephone Encounter (Signed)
Carelink alert received 11/03/20 for AF burden 62.3%,  longest logged 50 hours; EGMs suggests A. Fib - ongoing; V rate histogram shows ~60% binned >100 bpm. History of PAF.  233 HVR events; longest logged 1 hour 21 minutes w/ rates 170's to 200's bpm; EGMs suggest SVT and AF w/ RVR.  Patient reports she has experienced increased palpitations over the past few months. Per last OV note 08/20/20, discussion was possibly to increase her metoprolol from 50--100 mg daily. Patient states she is not taking any metoprolol or Toprol XL (on EMR). States a Midwife Dr. Caryl Comes or her GI doctor stopped it.Reports compliance medications including Cardizem 30 mg 1 tablet daily prn for palpitations, Cardizem CD 120 mg BID, Eliquis 5 mg BID. Patient reports her GI issues have resolved and she has done well. Advised to call with any questions or concerns. Verbalized understanding.   Advised I will forward to Dr. Caryl Comes and we will call with changes.

## 2020-11-09 ENCOUNTER — Ambulatory Visit
Admission: RE | Admit: 2020-11-09 | Discharge: 2020-11-09 | Disposition: A | Discharge status: Home / Self Care | Payer: Medicare Other | Source: Ambulatory Visit | Attending: Geriatric Medicine | Admitting: Geriatric Medicine

## 2020-11-09 DIAGNOSIS — I10 Essential (primary) hypertension: Secondary | ICD-10-CM | POA: Diagnosis not present

## 2020-11-09 DIAGNOSIS — R0600 Dyspnea, unspecified: Secondary | ICD-10-CM | POA: Diagnosis not present

## 2020-11-09 DIAGNOSIS — I712 Thoracic aortic aneurysm, without rupture: Secondary | ICD-10-CM | POA: Diagnosis not present

## 2020-11-09 DIAGNOSIS — J432 Centrilobular emphysema: Secondary | ICD-10-CM | POA: Diagnosis not present

## 2020-11-09 DIAGNOSIS — R911 Solitary pulmonary nodule: Secondary | ICD-10-CM

## 2020-11-09 NOTE — Telephone Encounter (Signed)
Can we get her in next week or two with one of the APPs to agument rate control

## 2020-11-10 NOTE — Progress Notes (Addendum)
Cardiology Office Note Date:  11/10/2020  Patient ID:  [name] [name] 1932/05/10, MRN [id number] PCP:  Lajean Manes, MD  Cardiologist:  Dr. Johnsie Cancel Electrophysiologist: Dr. Caryl Comes    Chief Complaint: evaluate rate control, management  History of Present Illness: [name] [name] is a 86 y.o. female with history of PAFib, tcahy-brady w/PPM, HTN, CVA, DVT.   Falls July 2020, fall with chest wall trauma, she is not entirely sure of the mechanism, said she was coming inside, had something in her had, was struggling with the heavy door and her dog was near by, she seems to recall falling, and does not think she fainted, just not sure what tripped her up Dec 2019: a trip/mechanical fall, broke her R wrist trying to stop herslef Hospitalized 05/2018 after a slip fall accident in her garden resulting in multiple rib fractures and PTX   I saw her Sept 2020 She comes in today to be seen for dr. Caryl Comes.  She was recently seen by A. Albertina Senegal, [place] 07/11/2019, he mentions she had fallen a few weeks prior to her visit did not seek attention, seemed there was some concern she may had suffered injury to her shoulder, also had bruising to her chest wall. In review of the pacer check done, at that visit noted 11.4% AFib burden, rates not very well controlled in AFib, burden appeared similar to that in the last 12 mo. She was in SR/paced at that visit Device clinic got a AFib burden alert 8/17, with presenting AFib RVR, rates 120's-140's.  Her dilt was increased to BID and asked to come in for evaluation. The patient today feels well.  Sunday she was very aware of her heart racing, did not particularly feel poorly though was uncomfortable, this as the first time she was really aware of her AFib to that extent.  She denies any CP or SOB, no dizzy spells, no near syncope or syncope She is tolerating the BID diltiazem so far without issue She denies any pulmonary concerns, she did not do f/u PFTs  in 105mo as recommended, says was very hard and anxiety provoking for her, she does not want to do them, denies any SOB or pulmonary symptoms, does not want to repeat the test Metoprolol was added back to her regime She denied concerns about her falls, we discussed importance of care/safety.  She saw Dr. Johnsie Cancel Aug 2021, doing well, minimal palpitations, no changes were made.  She last saw Dr. Caryl Comes Sept 2021, GI issues w/diarrhea that improved off metoprolol and on Pepto.  Her HRs off the BB notable for AF rates increased to about 110-120 and the metoprolol resumed.  If GI tolerated, planned to increase dose to 100mg  daily.  Device remote Dec 2021 noted significant up tick in AFib burden and AFib rates as well.  TODAY She feels quite well. Her diarrhea has stopped and this has been a real burden off her. No CP, denies SOB or DOE. She is quite happy with how she feels of late, including today. She is aware of increased AFib, can feel her heart irregular more oftne, does not perceive it racing can feel "a little fast, but not for long" She denies any symptoms of PND or orthopnea, no edema.  Denies any more falls No bleeding or signs of bleeding States her PMD manages her cholesterol and recently told was high but can not tolerate the statins, and does not want to take any medicine for it.    She  is very irregular about taking her medicines.  Tends to "get to them" when she thinks abut them.  She will sometimes take her morning medicines late morning, and sometimes falls asleep without taking her nighttime meds, other times, has insomnia and will take her AM meds at 2-3AM and PM again, when she thinks to. She feels like for the most part gets them in eventually She is not taking metoprolol at all, this she is certain of, states that at her last visit Dr. Caryl Comes told her to stop the metoprolol and magnesium   AFib Hx Rate control and AAD hx Intolerant of metoprolol (unclear why) Intolerant of  CCB with edema  Rythmol stopped 2/2 ataxia/neurotoxicity at lower doses recurrent AFib June 2019 discussed AFib ablation with Dr. Rayann Heman, though patient did not want to purse this Amiodarone started June 2018  >> Stopped Dec 2019, she says with pulmonary concerns, Noted Dr. Johnsie Cancel stopped with abn PFTs         Device information:  MDT dual chamber PPM, implanted 09/14/05, gen change 05/07/17   Past Medical History:  Diagnosis Date  . Arthritis   . CVA    Lacunar and cerebellar infarct  . Depression   . DVT (deep venous thrombosis) ([facility])    Details not available in the chart  . Edema   . Mitral valve regurgitation    a. echo 2009 mild to moderate MR  . Paroxysmal atrial fibrillation (HCC)   . Pneumonia    as a child  . Presence of permanent cardiac pacemaker   . Sinoatrial node dysfunction (HCC)   . Unspecified essential hypertension     Past Surgical History:  Procedure Laterality Date  . CLOSED REDUCTION ULNAR SHAFT Right 11/15/2018   Procedure: CLOSED REDUCTION RIGHT DISTAL ULNAR FRACTURE;  Surgeon: Charlotte Crumb, MD;  Location: [facility];  Service: Orthopedics;  Laterality: Right;  . OPEN REDUCTION INTERNAL FIXATION (ORIF) DISTAL RADIAL FRACTURE Right 11/15/2018   Procedure: OPEN REDUCTION INTERNAL FIXATION (ORIF) RIGHT DISTAL RADIAL FRACTURE;  Surgeon: Charlotte Crumb, MD;  Location: [facility];  Service: Orthopedics;  Laterality: Right;  . PACEMAKER INSERTION  09-14-2005   MDT EnRhythm dual chamber pacemaker implanted by Dr Lovena Le for SND  . PPM GENERATOR CHANGEOUT N/A 05/07/2017   Procedure: PPM Generator Changeout;  Surgeon: Deboraha Sprang, MD;  Location: [facility];  Service: Cardiovascular;  Laterality: N/A;  . TONSILLECTOMY      Current Outpatient Medications  Medication Sig Dispense Refill  . Alpha-D-Galactosidase (BEANO) TABS Take 1 tablet by mouth 3 (three) times daily before meals.    Marland Kitchen Apoaequorin (PREVAGEN) 10 MG CAPS Take 10 mg by mouth daily.      . Cholecalciferol (VITAMIN D3) 2000 UNITS TABS Take 2,000 Units by mouth daily.     Marland Kitchen desonide (DESOWEN) 0.05 % ointment Apply 1 application topically 2 (two) times daily as needed (for irritation).     Marland Kitchen diltiazem (CARDIZEM CD) 120 MG 24 hr capsule TAKE (1) CAPSULE TWICE DAILY. 180 capsule 1  . diltiazem (CARDIZEM) 30 MG tablet TAKE 1 TABLET ONCE DAILY AS NEEDED FOR PALPATATION. 90 tablet 0  . ELIQUIS 5 MG TABS tablet TAKE 1 TABLET BY MOUTH TWICE DAILY. 60 tablet 5  . Lactase (LACTAID PO) Take 1 tablet by mouth 3 (three) times daily before meals.    Marland Kitchen loratadine (CLARITIN) 10 MG tablet Take 1 tablet (10 mg total) by mouth daily. For allergies, congestion 30 tablet 3  . LORazepam (ATIVAN) 0.5  MG tablet Take 0.5 mg by mouth daily as needed for anxiety or sleep.     . metoprolol succinate (TOPROL-XL) 50 MG 24 hr tablet Take 1 tablet (50 mg total) by mouth daily. Take with or immediately following a meal. 90 tablet 3  . Polyvinyl Alcohol-Povidone (REFRESH OP) Place 1 drop into both eyes 2 (two) times daily as needed (for dry eyes).     . sodium chloride (OCEAN) 0.65 % SOLN nasal spray Place 1 spray into both nostrils as needed for congestion.     No current facility-administered medications for this visit.    Allergies:   Gluten meal, Itraconazole, Lactose intolerance (gi), and Statins   Social History:  The patient  reports that she quit smoking about 36 years ago. She has never used smokeless tobacco. She reports current alcohol use of about 14.0 standard drinks of alcohol per week. She reports that she does not use drugs.   Family History:  The patient's family history includes Cancer in her mother; Coronary artery disease in her sister and sister; Diabetes in her father; Hypertension in her father; Mitral valve prolapse in her mother; Stroke in her brother.  ROS:  Please see the history of present illness.  All other systems are reviewed and otherwise negative.   PHYSICAL EXAM:  VS:   There were no vitals taken for this visit. BMI: There is no height or weight on file to calculate BMI. Well nourished, well developed, in no acute distress  HEENT: normocephalic, atraumatic  Neck: no JVD, carotid bruits or masses Cardiac: irreg-irreg, tachycardic; no significant murmurs, no rubs, or gallops Lungs:  CTA b/l, no wheezing, rhonchi or rales  Abd: soft, nontender MS: no deformity age appropriate atrophy Ext:  no edema, chronic looking skin changes, spider veins Skin: warm and dry, no rash Neuro:  No gross deficits appreciated Psych: euthymic mood, full affect  PPM site is stable, no tethering or discomfort   EKG:  Done today and reviewed by myself AFib, RVR 312bpm, RBBB HR while I am checking her device a bit better 110's-120's  PPM interrogation done today by industry and reviewed by myself: battery and lead measurements are ok Battery and lead measurements are good NSVT episodes Available EGMs reviewed, are brief 1:1 conduction  AF burden is 63%, uncontrolled rates V rates during hr interrogation 110's-120's, RV thresholds not done, the device has threshold testing completed 11/09/20 of 0.875V  06/30/14: TTE - Left ventricle: The cavity size was normal. Systolic function was normal. The estimated ejection fraction was in the range of 50% to 55%. Wall motion was normal; there were no regional wall motion abnormalities. Left ventricular diastolic function parameters were normal. - Aortic valve: Trileaflet; mildly thickened, mildly calcified leaflets. There was no significant regurgitation. - Aortic root: The aortic root was normal in size. - Mitral valve: Calcified annulus. Mildly thickened leaflets . There was mild regurgitation. - Left atrium: The atrium was at the upper limits of normal in size. - Right ventricle: Systolic function was normal. - Right atrium: The atrium was normal in size. - Tricuspid valve: There was moderate regurgitation. -  Pulmonary arteries: Systolic pressure was mildly increased. PA peak pressure: 42 mm Hg (S) Impressions - Normal biventricular size and function. Mild pulmonary hypertension.    Recent Labs: No results found for requested labs within last 8760 hours.  No results found for requested labs within last 8760 hours.   CrCl cannot be calculated (Patient's most recent lab result is older than  the maximum 21 days allowed.).   Wt Readings from Last 3 Encounters:  08/20/20 145 lb (65.8 kg)  07/14/20 149 lb (67.6 kg)  07/12/20 146 lb 6.4 oz (66.4 kg)     Other studies reviewed: Additional studies/records reviewed today include: summarized above  ASSESSMENT AND PLAN:  1. PAFib     CHA2DS2Vasc is 6 on Eliquis, appropriately dosed      Rates are uncontrolled She is very irregular about taking her medicines, misses as well. She has not yet taken her AM medicines, she does have with her her PRN dilt and went ahead and took it. I have asked her to take her usual Am diltiazem as soon as she gets home, and take her PMD diltiazem as well at the usual time She is taking no metoprolol at all and will start Toprol 50mg  dialy.  I discussed with her the importance of taking her medicines at about the same time each day, discussed importance of not missing any doses, and discussed Eliquis and stroke risk reduction and missing doses puts her at risk. We discussed this at length, and asked her to create a routine for herself, make an alarm, reminnder of some kind.  Thankfully she is completely asymptomatic with only a slight awareness of palpitations. She has missed some eliquis, unable to consider DCCV   2. PPM     Intact function, no programming changes made  3. HTN     Should tolerate the metoprolol   4. Falls     Not an ongoing problem  Disposition: see her back in 1-2 weeks, sooner if needed.   Current medicines are reviewed at length with the patient today.  The patient did not have  any concerns regarding medicines.  Haywood Lasso, PA-C 11/10/2020 1:41 PM     [place] [phone] (office)  [phone] (fax)

## 2020-11-11 ENCOUNTER — Encounter: Payer: Self-pay | Admitting: Physician Assistant

## 2020-11-11 ENCOUNTER — Ambulatory Visit (INDEPENDENT_AMBULATORY_CARE_PROVIDER_SITE_OTHER): Payer: Medicare Other | Admitting: Physician Assistant

## 2020-11-11 ENCOUNTER — Other Ambulatory Visit: Payer: Self-pay

## 2020-11-11 VITALS — BP 122/70 | HR 68 | Ht 66.0 in | Wt 143.0 lb

## 2020-11-11 DIAGNOSIS — I480 Paroxysmal atrial fibrillation: Secondary | ICD-10-CM

## 2020-11-11 DIAGNOSIS — I10 Essential (primary) hypertension: Secondary | ICD-10-CM

## 2020-11-11 DIAGNOSIS — Z950 Presence of cardiac pacemaker: Secondary | ICD-10-CM

## 2020-11-11 MED ORDER — METOPROLOL SUCCINATE ER 50 MG PO TB24: 50.0000 mg | ORAL_TABLET | Freq: Every evening | ORAL | 0 refills | Status: DC

## 2020-11-11 NOTE — Patient Instructions (Addendum)
Medication Instructions:  START TOPROL XL 50 MG IN THE EVENING   TAKE DILTIAZEM 120 MG  DOSE AS SOON AS YOU GET HOME   *If you need a refill on your cardiac medications before your next appointment, please call your pharmacy*   Lab Work:  NONE ORDERED  TODAY   If you have labs (blood work) drawn today and your tests are completely normal, you will receive your results only by: Marland Kitchen MyChart Message (if you have MyChart) OR . A paper copy in the mail If you have any lab test that is abnormal or we need to change your treatment, we will call you to review the results.   Testing/Procedures:NONE ORDERED  TODAY   Follow-Up: At [facility], you and your health needs are our priority.  As part of our continuing mission to provide you with exceptional heart care, we have created designated Provider Care Teams.  These Care Teams include your primary Cardiologist (physician) and Advanced Practice Providers (APPs -  Physician Assistants and Nurse Practitioners) who all work together to provide you with the care you need, when you need it.  We recommend signing up for the patient portal called "MyChart".  Sign up information is provided on this After Visit Summary.  MyChart is used to connect with patients for Virtual Visits (Telemedicine).  Patients are able to view lab/test results, encounter notes, upcoming appointments, etc.  Non-urgent messages can be sent to your provider as well.   To learn more about what you can do with MyChart, go to [email or web address].    Your next appointment:   7-10  day(s)  The format for your next appointment:   In Person  Provider:   You may see Virl Axe, MD or one of the following Advanced Practice Providers on your designated Care Team:    Chanetta Marshall, NP  Tommye Standard, [place]  Legrand Como "Oda Kilts, [place]    Other Instructions

## 2020-11-11 NOTE — Telephone Encounter (Signed)
Apt. Made 11/11/20 with APP.

## 2020-11-17 NOTE — Progress Notes (Signed)
Remote pacemaker transmission.   

## 2020-11-21 NOTE — Progress Notes (Signed)
Cardiology Office Note Date:  11/23/2020  Patient ID:  [name] [name], [name] [name] [birth year], MRN [id number] PCP:  Lajean Manes, MD  Cardiologist:  Dr. Johnsie Cancel Electrophysiologist: Dr. Caryl Comes    Chief Complaint:   f/u rate control, management  History of Present Illness: [name] [name] is a 86 y.o. female with history of PAFib, tcahy-brady w/PPM, HTN, CVA, DVT.   Falls July 2020, fall with chest wall trauma, she is not entirely sure of the mechanism, said she was coming inside, had something in her had, was struggling with the heavy door and her dog was near by, she seems to recall falling, and does not think she fainted, just not sure what tripped her up Dec 2019: a trip/mechanical fall, broke her R wrist trying to stop herslef Hospitalized 05/2018 after a slip fall accident in her garden resulting in multiple rib fractures and PTX   I saw her Sept 2020 She comes in today to be seen for dr. Caryl Comes.  She was recently seen by A. Albertina Sen[place], [place] 07/11/2019, he mentions she had fallen a few weeks prior to her visit did not seek attention, seemed there was some concern she may had suffered injury to her shoulder, also had bruising to her chest wall. In review of the pacer check done, at that visit noted 11.4% AFib burden, rates not very well controlled in AFib, burden appeared similar to that in the last 12 mo. She was in SR/paced at that visit Device clinic got a AFib burden alert 8/17, with presenting AFib RVR, rates 120's-140's.  Her dilt was increased to BID and asked to come in for evaluation. The patient today feels well.  Sunday she was very aware of her heart racing, did not particularly feel poorly though was uncomfortable, this as the first time she was really aware of her AFib to that extent.  She denies any CP or SOB, no dizzy spells, no near syncope or syncope She is tolerating the BID diltiazem so far without issue She denies any pulmonary concerns, she did not do f/u PFTs in  42mo as recommended, says was very hard and anxiety provoking for her, she does not want to do them, denies any SOB or pulmonary symptoms, does not want to repeat the test Metoprolol was added back to her regime She denied concerns about her falls, we discussed importance of care/safety.  She saw Dr. Johnsie Cancel Aug 2021, doing well, minimal palpitations, no changes were made.  She last saw Dr. Caryl Comes Sept 2021, GI issues w/diarrhea that improved off metoprolol and on Pepto.  Her HRs off the BB notable for AF rates increased to about 110-120 and the metoprolol resumed.  If GI tolerated, planned to increase dose to 100mg  daily.  Device remote Dec 2021 noted significant up tick in AFib burden and AFib rates as well.  I saw her 11/11/20 She feels quite well. Her diarrhea has stopped and this has been a real burden off her. No CP, denies SOB or DOE. She is quite happy with how she feels of late, including today. She is aware of increased AFib, can feel her heart irregular more oftne, does not perceive it racing can feel "a little fast, but not for long" She denies any symptoms of PND or orthopnea, no edema Denies any more falls No bleeding or signs of bleeding States her PMD manages her cholesterol and recently told was high but can not tolerate the statins, and does not want to take any medicine for it.  She is very irregular about taking her medicines.  Tends to "get to them" when she thinks abut them.  She will sometimes take her morning medicines late morning, and sometimes falls asleep without taking her nighttime meds, other times, has insomnia and will take her AM meds at 2-3AM and PM again, when she thinks to. She feels like for the most part gets them in eventually She is not taking metoprolol at all, this she is certain of, states that at her last visit Dr. Caryl Comes told her to stop the metoprolol and magnesium   Rates 110's-120s and asymptomatic We discussed the importance of taking her  medicinces regularly and every day Toprol XL 50mg  daily started She had missed some Eliquis doses and unable to consider DCCV w/o TEE Given asymptomatic deferred.  TODAY She is doing OK, she is aware of her AFib and knows that in the last few mnths is having more, but does not make her feel bad.  Since she was here last this has settled some, and the episodes seem shorter perhaps. She waits a couple hours to take the PRN dilt has used it a couple times sxince we saw her.  No CP, no SOB or DOE She is taking the metoprolol and has not developed diarrhea. She admits that she can not be certain of no missed doses, this includes her Eliquis. Do difficulties with her ADLs No dizzy spells, near syncope or syncope.  No bleeding or signs of bleeding    AFib Hx Rate control and AAD hx Intolerant of metoprolol (unclear why) Intolerant of CCB with edema  Rythmol stopped 2/2 ataxia/neurotoxicity at lower doses recurrent AFib June 2019 discussed AFib ablation with Dr. Rayann Heman, though patient did not want to purse this Amiodarone started June 2018  >> Stopped Dec 2019, she says with pulmonary concerns, Noted Dr. Johnsie Cancel stopped with abn PFTs         Device information:  MDT dual chamber PPM, implanted 09/14/05, gen change 05/07/17   Past Medical History:  Diagnosis Date  . Arthritis   . CVA    Lacunar and cerebellar infarct  . Depression   . DVT (deep venous thrombosis) ([place])    Details not available in the chart  . Edema   . Mitral valve regurgitation    a. echo 2009 mild to moderate MR  . Paroxysmal atrial fibrillation (HCC)   . Pneumonia    as a child  . Presence of permanent cardiac pacemaker   . Sinoatrial node dysfunction (HCC)   . Unspecified essential hypertension     Past Surgical History:  Procedure Laterality Date  . CLOSED REDUCTION ULNAR SHAFT Right 11/15/2018   Procedure: CLOSED REDUCTION RIGHT DISTAL ULNAR FRACTURE;  Surgeon: Charlotte Crumb, MD;  Location: [place];  Service: Orthopedics;  Laterality: Right;  . OPEN REDUCTION INTERNAL FIXATION (ORIF) DISTAL RADIAL FRACTURE Right 11/15/2018   Procedure: OPEN REDUCTION INTERNAL FIXATION (ORIF) RIGHT DISTAL RADIAL FRACTURE;  Surgeon: Charlotte Crumb, MD;  Location: [place];  Service: Orthopedics;  Laterality: Right;  . PACEMAKER INSERTION  09-14-2005   MDT EnRhythm dual chamber pacemaker implanted by Dr Lovena Le for SND  . PPM GENERATOR CHANGEOUT N/A 05/07/2017   Procedure: PPM Generator Changeout;  Surgeon: Deboraha Sprang, MD;  Location: [place] [facility];  Service: Cardiovascular;  Laterality: N/A;  . TONSILLECTOMY      Current Outpatient Medications  Medication Sig Dispense Refill  . Alpha-D-Galactosidase (BEANO) TABS Take 1 tablet by mouth 3 (three) times  daily before meals.    Marland Kitchen Apoaequorin 10 MG CAPS Take 10 mg by mouth daily.     . Cholecalciferol (VITAMIN D3) 2000 UNITS TABS Take 2,000 Units by mouth daily.    Marland Kitchen desonide (DESOWEN) 0.05 % ointment Apply 1 application topically 2 (two) times daily as needed (for irritation).     Marland Kitchen diltiazem (CARDIZEM CD) 120 MG 24 hr capsule TAKE (1) CAPSULE TWICE DAILY. 180 capsule 1  . diltiazem (CARDIZEM) 30 MG tablet TAKE 1 TABLET ONCE DAILY AS NEEDED FOR PALPATATION. 90 tablet 0  . ELIQUIS 5 MG TABS tablet TAKE 1 TABLET BY MOUTH TWICE DAILY. 60 tablet 5  . Lactase (LACTAID PO) Take 1 tablet by mouth as needed.    . loratadine (CLARITIN) 10 MG tablet Take 1 tablet (10 mg total) by mouth daily. For allergies, congestion 30 tablet 3  . LORazepam (ATIVAN) 0.5 MG tablet Take 0.5 mg by mouth daily as needed for anxiety or sleep.    . metoprolol succinate (TOPROL-XL) 50 MG 24 hr tablet Take 1 tablet (50 mg total) by mouth every evening. Take with or immediately following a meal. 90 tablet 0  . Polyvinyl Alcohol-Povidone (REFRESH OP) Place 1 drop into both eyes 2 (two) times daily as needed (for dry eyes).     . sodium chloride (OCEAN) 0.65 % SOLN nasal spray Place 1  spray into both nostrils as needed for congestion.     No current facility-administered medications for this visit.    Allergies:   Gluten meal, Itraconazole, Lactose intolerance (gi), and Statins   Social History:  The patient  reports that she quit smoking about 36 years ago. She has never used smokeless tobacco. She reports current alcohol use of about 14.0 standard drinks of alcohol per week. She reports that she does not use drugs.   Family History:  The patient's family history includes Cancer in her mother; Coronary artery disease in her sister and sister; Diabetes in her father; Hypertension in her father; Mitral valve prolapse in her mother; Stroke in her brother.  ROS:  Please see the history of present illness.  All other systems are reviewed and otherwise negative.   PHYSICAL EXAM:  VS:  BP 120/70   Pulse 88   Ht 5\' 6"  (1.676 m)   Wt 145 lb (65.8 kg)   SpO2 99%   BMI 23.40 kg/m  BMI: Body mass index is 23.4 kg/m. Well nourished, well developed, in no acute distress  HEENT: normocephalic, atraumatic  Neck: no JVD, carotid bruits or masses Cardiac: irreg-irreg, tachycardic; no significant murmurs, no rubs, or gallops Lungs:  CTA b/l, no wheezing, rhonchi or rales  Abd: soft, nontender MS: no deformity age appropriate atrophy Ext:   no edema, chronic looking skin changes, spider veins Skin: warm and dry, no rash Neuro:  No gross deficits appreciated Psych: euthymic mood, full affect  PPM site is stable, no tethering or discomfort   EKG:  Not done today  PPM interrogation done today and reviewed by myself: battery and lead  Battery and lead measurements are good She arrived in an AP/VS rhythm When doing sensing test she developed Aflutter though rates 90's   06/30/14: TTE - Left ventricle: The cavity size was normal. Systolic function was normal. The estimated ejection fraction was in the range of 50% to 55%. Wall motion was normal; there were no regional  wall motion abnormalities. Left ventricular diastolic function parameters were normal. - Aortic valve: Trileaflet; mildly thickened, mildly calcified  leaflets. There was no significant regurgitation. - Aortic root: The aortic root was normal in size. - Mitral valve: Calcified annulus. Mildly thickened leaflets . There was mild regurgitation. - Left atrium: The atrium was at the upper limits of normal in size. - Right ventricle: Systolic function was normal. - Right atrium: The atrium was normal in size. - Tricuspid valve: There was moderate regurgitation. - Pulmonary arteries: Systolic pressure was mildly increased. PA peak pressure: 42 mm Hg (S) Impressions - Normal biventricular size and function. Mild pulmonary hypertension.    Recent Labs: No results found for requested labs within last 8760 hours.  No results found for requested labs within last 8760 hours.   CrCl cannot be calculated (Patient's most recent lab result is older than the maximum 21 days allowed.).   Wt Readings from Last 3 Encounters:  11/23/20 145 lb (65.8 kg)  11/11/20 143 lb (64.9 kg)  08/20/20 145 lb (65.8 kg)     Other studies reviewed: Additional studies/records reviewed today include: summarized above  ASSESSMENT AND PLAN:  1. PAFib     CHA2DS2Vasc is 6 on Eliquis, appropriately dosed      She was immediately aware of her rhythm change today so I thing her symptoms are rekliable Dr. Lovena Le and I tried to pace terminate the AFlutter >> coarse AFib, V rates 90's hopefuly she will spontaneously convert now  Discussed importance of eliquis and all of her medicines and creating system to remind her to not miss any We may need to start to think about AV node ablation  I will have her see dr. Caryl Comes in a month and revisit her burden rates, and symptoms.  Labs for her eliquis today   2. PPM     Intact function, no programming changes made  3. HTN      Looks OK   4. Falls      Not an ongoing problem    Disposition: as above   Current medicines are reviewed at length with the patient today.  The patient did not have any concerns regarding medicines.  Haywood Lasso, PA-C 11/23/2020 11:47 AM     [facility] [place] [address] [phone] (office)  [phone] (fax)

## 2020-11-23 ENCOUNTER — Ambulatory Visit (INDEPENDENT_AMBULATORY_CARE_PROVIDER_SITE_OTHER): Payer: Medicare Other | Admitting: Physician Assistant

## 2020-11-23 ENCOUNTER — Encounter: Payer: Self-pay | Admitting: Physician Assistant

## 2020-11-23 ENCOUNTER — Other Ambulatory Visit: Payer: Self-pay

## 2020-11-23 DIAGNOSIS — I4891 Unspecified atrial fibrillation: Secondary | ICD-10-CM | POA: Diagnosis not present

## 2020-11-23 DIAGNOSIS — I480 Paroxysmal atrial fibrillation: Secondary | ICD-10-CM

## 2020-11-23 DIAGNOSIS — I10 Essential (primary) hypertension: Secondary | ICD-10-CM | POA: Diagnosis not present

## 2020-11-23 DIAGNOSIS — I495 Sick sinus syndrome: Secondary | ICD-10-CM | POA: Diagnosis not present

## 2020-11-23 DIAGNOSIS — Z950 Presence of cardiac pacemaker: Secondary | ICD-10-CM | POA: Diagnosis not present

## 2020-11-23 NOTE — Patient Instructions (Addendum)
Medication Instructions:   Your physician recommends that you continue on your current medications as directed. Please refer to the Current Medication list given to you today.   *If you need a refill on your cardiac medications before your next appointment, please call your pharmacy*   Lab Work: BMET CBC TODAY   If you have labs (blood work) drawn today and your tests are completely normal, you will receive your results only by: Marland Kitchen MyChart Message (if you have MyChart) OR . A paper copy in the mail If you have any lab test that is abnormal or we need to change your treatment, we will call you to review the results.   Testing/Procedures: NONE ORDERED  TODAY   Follow-Up: At [facility], you and your health needs are our priority.  As part of our continuing mission to provide you with exceptional heart care, we have created designated Provider Care Teams.  These Care Teams include your primary Cardiologist (physician) and Advanced Practice Providers (APPs -  Physician Assistants and Nurse Practitioners) who all work together to provide you with the care you need, when you need it.  We recommend signing up for the patient portal called "MyChart".  Sign up information is provided on this After Visit Summary.  MyChart is used to connect with patients for Virtual Visits (Telemedicine).  Patients are able to view lab/test results, encounter notes, upcoming appointments, etc.  Non-urgent messages can be sent to your provider as well.   To learn more about what you can do with MyChart, go to [email or web address].    Your next appointment:   1 month(s)  The format for your next appointment:   In Person  Provider:   Virl Axe, MD   Other Instructions

## 2020-11-24 LAB — CBC
Hematocrit: 44.7 % (ref 34.0–46.6)
Hemoglobin: 15 g/dL (ref 11.1–15.9)
MCH: 32.5 pg (ref 26.6–33.0)
MCHC: 33.6 g/dL (ref 31.5–35.7)
MCV: 97 fL (ref 79–97)
Platelets: 139 10*3/uL — ABNORMAL LOW (ref 150–450)
RBC: 4.62 x10E6/uL (ref 3.77–5.28)
RDW: 14 % (ref 11.7–15.4)
WBC: 7.4 10*3/uL (ref 3.4–10.8)

## 2020-11-24 LAB — BASIC METABOLIC PANEL
BUN/Creatinine Ratio: 19 (ref 12–28)
BUN: 18 mg/dL (ref 8–27)
CO2: 21 mmol/L (ref 20–29)
Calcium: 9.4 mg/dL (ref 8.7–10.3)
Chloride: 107 mmol/L — ABNORMAL HIGH (ref 96–106)
Creatinine, Ser: 0.94 mg/dL (ref 0.57–1.00)
GFR calc Af Amer: 64 mL/min/{1.73_m2} (ref >59)
GFR calc non Af Amer: 55 mL/min/{1.73_m2} — ABNORMAL LOW (ref >59)
Glucose: 96 mg/dL (ref 65–99)
Potassium: 4.6 mmol/L (ref 3.5–5.2)
Sodium: 144 mmol/L (ref 134–144)

## 2020-12-31 ENCOUNTER — Encounter: Payer: Self-pay | Admitting: Internal Medicine

## 2020-12-31 ENCOUNTER — Other Ambulatory Visit: Payer: Self-pay

## 2020-12-31 ENCOUNTER — Ambulatory Visit (INDEPENDENT_AMBULATORY_CARE_PROVIDER_SITE_OTHER): Payer: Medicare Other | Admitting: Internal Medicine

## 2020-12-31 VITALS — BP 124/80 | HR 147 | Ht 66.0 in | Wt 140.0 lb

## 2020-12-31 DIAGNOSIS — I495 Sick sinus syndrome: Secondary | ICD-10-CM

## 2020-12-31 DIAGNOSIS — I480 Paroxysmal atrial fibrillation: Secondary | ICD-10-CM

## 2020-12-31 DIAGNOSIS — Z950 Presence of cardiac pacemaker: Secondary | ICD-10-CM

## 2020-12-31 NOTE — Patient Instructions (Signed)
Medication Instructions:  Your physician recommends that you continue on your current medications as directed. Please refer to the Current Medication list given to you today. *If you need a refill on your cardiac medications before your next appointment, please call your pharmacy*   Lab Work: None ordered.  If you have labs (blood work) drawn today and your tests are completely normal, you will receive your results only by: Marland Kitchen MyChart Message (if you have MyChart) OR . A paper copy in the mail If you have any lab test that is abnormal or we need to change your treatment, we will call you to review the results.   Testing/Procedures: Your physician has recommended that you have an ablation. Catheter ablation is a medical procedure used to treat some cardiac arrhythmias (irregular heartbeats). During catheter ablation, a long, thin, flexible tube is put into a blood vessel in your groin (upper thigh), or neck. This tube is called an ablation catheter. It is then guided to your heart through the blood vessel. Radio frequency waves destroy small areas of heart tissue where abnormal heartbeats may cause an arrhythmia to start. Please see the instruction sheet given to you today.     Follow-Up: At [facility], you and your health needs are our priority.  As part of our continuing mission to provide you with exceptional heart care, we have created designated Provider Care Teams.  These Care Teams include your primary Cardiologist (physician) and Advanced Practice Providers (APPs -  Physician Assistants and Nurse Practitioners) who all work together to provide you with the care you need, when you need it.  We recommend signing up for the patient portal called "MyChart".  Sign up information is provided on this After Visit Summary.  MyChart is used to connect with patients for Virtual Visits (Telemedicine).  Patients are able to view lab/test results, encounter notes, upcoming appointments, etc.   Non-urgent messages can be sent to your provider as well.   To learn more about what you can do with MyChart, go to [email or web address].    Your next appointment:   These are the dates Dr Caryl Comes currently has available for ablation  02/02/2021, 02/18/2021 02/25/2021, 03/02/2021, 03/09/2021.

## 2020-12-31 NOTE — Progress Notes (Signed)
HPI  [name] [name] is a 87 y.o. female seen in followup for atrial fibrillation for which she took Rythmol and bradycardia for which she is status post pacemaker implantation 2006. Underwent generator replacement 5/18  She saw Dr. Greggory Brandy 2019; amiodarone was initiated.  Stopped 12/19 because of pulmonary concerns. Issues of rate control were challenging with up titration of diltiazem and concomitant beta-blockers seeming to do reasonably well.  AV junction ablation previously discussed but at that time deferred   She been having recurrent episodes of atrial fibrillation with rapid rates.  Sometimes she is aware of her rapid rates associated with dyspnea and lightheadedness and some balance issues.  Oftentimes however, she is unaware of it.  Her device interrogation suggest that she is in atrial fibrillation persistently almost always and that her average heart rate is frequently over 100 and her peak heart rates are over 200.   DATE TEST EF   8/15 Echo   55-65 %   1/19 Echo   55-65 % Mild LAE  8/21 Echo  55-60%    Prior antiarrhythmic drugs include propafenone and amiodarone; dofetilide precluded by QT prolongation   Date Cr K Hgb  6/18 0.89  13.6  1/19  1.01 3.5 13.3  9/20 1.16 4.3 99.8    Thromboembolic risk factors ( age  -2, HTN-1, CVA -2, Gender-1) for a CHADSVASc Score of >=6  Past Medical History:  Diagnosis Date  . Arthritis   . CVA    Lacunar and cerebellar infarct  . Depression   . DVT (deep venous thrombosis) ([place])    Details not available in the chart  . Edema   . Mitral valve regurgitation    a. echo 2009 mild to moderate MR  . Paroxysmal atrial fibrillation (HCC)   . Pneumonia    as a child  . Presence of permanent cardiac pacemaker   . Sinoatrial node dysfunction (HCC)   . Unspecified essential hypertension     Past Surgical History:  Procedure Laterality Date  . CLOSED REDUCTION ULNAR SHAFT Right 11/15/2018   Procedure: CLOSED REDUCTION RIGHT  DISTAL ULNAR FRACTURE;  Surgeon: Charlotte Crumb, MD;  Location: [place];  Service: Orthopedics;  Laterality: Right;  . OPEN REDUCTION INTERNAL FIXATION (ORIF) DISTAL RADIAL FRACTURE Right 11/15/2018   Procedure: OPEN REDUCTION INTERNAL FIXATION (ORIF) RIGHT DISTAL RADIAL FRACTURE;  Surgeon: Charlotte Crumb, MD;  Location: [place];  Service: Orthopedics;  Laterality: Right;  . PACEMAKER INSERTION  09-14-2005   MDT EnRhythm dual chamber pacemaker implanted by Dr Lovena Le for SND  . PPM GENERATOR CHANGEOUT N/A 05/07/2017   Procedure: PPM Generator Changeout;  Surgeon: Deboraha Sprang, MD;  Location: [place] [facility];  Service: Cardiovascular;  Laterality: N/A;  . TONSILLECTOMY      Current Outpatient Medications  Medication Sig Dispense Refill  . Alpha-D-Galactosidase (BEANO) TABS Take 1 tablet by mouth 3 (three) times daily before meals.    Marland Kitchen Apoaequorin 10 MG CAPS Take 10 mg by mouth daily.     . Cholecalciferol (VITAMIN D3) 2000 UNITS TABS Take 2,000 Units by mouth daily.    Marland Kitchen desonide (DESOWEN) 0.05 % ointment Apply 1 application topically 2 (two) times daily as needed (for irritation).     Marland Kitchen diltiazem (CARDIZEM CD) 120 MG 24 hr capsule TAKE (1) CAPSULE TWICE DAILY. 180 capsule 1  . diltiazem (CARDIZEM) 30 MG tablet TAKE 1 TABLET ONCE DAILY AS NEEDED FOR PALPATATION. 90 tablet 0  . ELIQUIS 5 MG TABS tablet TAKE 1  TABLET BY MOUTH TWICE DAILY. 60 tablet 5  . Lactase (LACTAID PO) Take 1 tablet by mouth as needed.    . loratadine (CLARITIN) 10 MG tablet Take 1 tablet (10 mg total) by mouth daily. For allergies, congestion 30 tablet 3  . LORazepam (ATIVAN) 0.5 MG tablet Take 0.5 mg by mouth daily as needed for anxiety or sleep.    . metoprolol succinate (TOPROL-XL) 50 MG 24 hr tablet Take 1 tablet (50 mg total) by mouth every evening. Take with or immediately following a meal. 90 tablet 0  . Polyvinyl Alcohol-Povidone (REFRESH OP) Place 1 drop into both eyes 2 (two) times daily as needed (for dry  eyes).     . sodium chloride (OCEAN) 0.65 % SOLN nasal spray Place 1 spray into both nostrils as needed for congestion.     No current facility-administered medications for this visit.    Allergies  Allergen Reactions  . Gluten Meal Diarrhea  . Itraconazole Diarrhea and Itching  . Lactose Intolerance (Gi) Diarrhea and Nausea Only  . Statins Other (See Comments)    Myalgias    Review of Systems negative except from HPI and PMH  Physical Exam BP 124/80   Pulse (!) 147   Ht 5\' 6"  (1.676 m)   Wt 140 lb (63.5 kg)   SpO2 97%   BMI 22.60 kg/m  Well developed and well nourished in no acute distress HENT normal Neck supple with JVP-flat Clear Device pocket well healed; without hematoma or erythema.  There is no tethering  Irregularly irregular rapid rate and rhythm Abd-soft with active BS No Clubbing cyanosis  edema Skin-warm and dry A & Oriented  Grossly normal sensory and motor function  ECG demonstrates atrial fibrillation at 147 Intervals-/11/36 Incomplete right bundle branch block  ECGs were reviewed from 2021 in sinus rhythm with QTCs of greater than about 460-40 ms      Assessment and  Plan  Atrial fibrillation-paroxysmal  Hypertension   Sinus node dysfunction   Right bundle branch block  Pacemaker-[employer]   Diarrhea question cause  Atrial fibrillation with rapid rates are becoming increasingly dominant.  Hence, we have discussed treatment strategies.  AV nodal blockade is largely maxed with her beta-blockers and her diltiazem; she is not particularly inclined towards digoxin which I think would help a little bit but not a lot.  She is also not a candidate for antiarrhythmic therapy having exhausted amiodarone and propafenone and with QT prolongation not being a candidate for class III.  Hence, in this 87 year old lady, I have suggested AV junction ablation.  We have reviewed its potential benefits as well as the potential risks.  She would like to proceed  but would like to find a time that works with her granddaughter to help in the postprocedural timeframe.  Surprisingly she is euvolemic.

## 2021-01-06 ENCOUNTER — Telehealth: Payer: Self-pay | Admitting: Internal Medicine

## 2021-01-06 DIAGNOSIS — I471 Supraventricular tachycardia: Secondary | ICD-10-CM

## 2021-01-06 DIAGNOSIS — I4891 Unspecified atrial fibrillation: Secondary | ICD-10-CM

## 2021-01-06 DIAGNOSIS — Z7901 Long term (current) use of anticoagulants: Secondary | ICD-10-CM

## 2021-01-06 DIAGNOSIS — Z01812 Encounter for preprocedural laboratory examination: Secondary | ICD-10-CM

## 2021-01-06 NOTE — Telephone Encounter (Signed)
Spoke with pt to let her know that Rosann Auerbach is out of the office today.. Inquired which date worked for her that Rosann Auerbach gave her.  She would like to do her ablation on 3/25.  Advised I will send to [facility] and she will be in contact once everything is scheduled so she can go over instructions.

## 2021-01-06 NOTE — Telephone Encounter (Signed)
New Message:      Please have Rosann Auerbach call pt, she is ready to schedule her Ablation

## 2021-01-11 LAB — CUP PACEART INCLINIC DEVICE CHECK
Battery Remaining Longevity: 131 mo
Battery Voltage: 3 V
Brady Statistic AP VP Percent: 0.07 %
Brady Statistic AP VS Percent: 85.07 %
Brady Statistic AS VP Percent: 0.02 %
Brady Statistic AS VS Percent: 15.23 %
Brady Statistic RA Percent Paced: 35.06 %
Brady Statistic RV Percent Paced: 2.1 %
Date Time Interrogation Session: 20220204151500
Implantable Lead Implant Date: 20061019
Implantable Lead Implant Date: 20061019
Implantable Lead Location: 753859
Implantable Lead Location: 753860
Implantable Lead Model: 5076
Implantable Pulse Generator Implant Date: 20180611
Lead Channel Impedance Value: 342 Ohm
Lead Channel Impedance Value: 418 Ohm
Lead Channel Impedance Value: 551 Ohm
Lead Channel Impedance Value: 608 Ohm
Lead Channel Pacing Threshold Amplitude: 0.625 V
Lead Channel Pacing Threshold Amplitude: 0.875 V
Lead Channel Pacing Threshold Pulse Width: 0.4 ms
Lead Channel Pacing Threshold Pulse Width: 0.4 ms
Lead Channel Sensing Intrinsic Amplitude: 4.125 mV
Lead Channel Sensing Intrinsic Amplitude: 5.125 mV
Lead Channel Setting Pacing Amplitude: 1.5 V
Lead Channel Setting Pacing Amplitude: 2.5 V
Lead Channel Setting Pacing Pulse Width: 0.4 ms
Lead Channel Setting Sensing Sensitivity: 0.9 mV

## 2021-01-11 NOTE — Telephone Encounter (Signed)
Spoke with pt and advised RN will be scheduling procedures 01/12/2021.  Once procedure is scheduled will contact pt to review instructions.  Pt verbalizes understanding and thanked Therapist, sports for the call.

## 2021-01-12 NOTE — Addendum Note (Signed)
Addended by: Thora Lance on: 01/12/2021 05:22 PM   Modules accepted: Orders

## 2021-01-24 DIAGNOSIS — Z961 Presence of intraocular lens: Secondary | ICD-10-CM | POA: Diagnosis not present

## 2021-01-24 DIAGNOSIS — H52203 Unspecified astigmatism, bilateral: Secondary | ICD-10-CM | POA: Diagnosis not present

## 2021-01-24 DIAGNOSIS — H02105 Unspecified ectropion of left lower eyelid: Secondary | ICD-10-CM | POA: Diagnosis not present

## 2021-01-27 ENCOUNTER — Telehealth: Payer: Self-pay

## 2021-01-27 NOTE — Telephone Encounter (Signed)
Spoke with pt and reviewed ablation instruction letter with pt.  See letter for complete details.  Pt verbalizes understanding and agrees with current plan.

## 2021-02-01 NOTE — Telephone Encounter (Signed)
Spoke with pt on 01/27/2021 and reviewed ablation instruction letter with pt.  See letter for complete details.  Pt verbalizes understanding and agrees with current plan.

## 2021-02-03 ENCOUNTER — Ambulatory Visit (INDEPENDENT_AMBULATORY_CARE_PROVIDER_SITE_OTHER): Payer: Medicare Other

## 2021-02-03 DIAGNOSIS — I495 Sick sinus syndrome: Secondary | ICD-10-CM

## 2021-02-06 LAB — CUP PACEART REMOTE DEVICE CHECK
Battery Remaining Longevity: 130 mo
Battery Voltage: 2.99 V
Brady Statistic AP VP Percent: 0.07 %
Brady Statistic AP VS Percent: 85.93 %
Brady Statistic AS VP Percent: 0.01 %
Brady Statistic AS VS Percent: 14.4 %
Brady Statistic RA Percent Paced: 30.7 %
Brady Statistic RV Percent Paced: 3.09 %
Date Time Interrogation Session: 20220310045420
Implantable Lead Implant Date: 20061019
Implantable Lead Implant Date: 20061019
Implantable Lead Location: 753859
Implantable Lead Location: 753860
Implantable Lead Model: 5076
Implantable Pulse Generator Implant Date: 20180611
Lead Channel Impedance Value: 323 Ohm
Lead Channel Impedance Value: 380 Ohm
Lead Channel Impedance Value: 532 Ohm
Lead Channel Impedance Value: 589 Ohm
Lead Channel Pacing Threshold Amplitude: 0.75 V
Lead Channel Pacing Threshold Amplitude: 1 V
Lead Channel Pacing Threshold Pulse Width: 0.4 ms
Lead Channel Pacing Threshold Pulse Width: 0.4 ms
Lead Channel Sensing Intrinsic Amplitude: 1.875 mV
Lead Channel Sensing Intrinsic Amplitude: 1.875 mV
Lead Channel Sensing Intrinsic Amplitude: 3.25 mV
Lead Channel Sensing Intrinsic Amplitude: 3.25 mV
Lead Channel Setting Pacing Amplitude: 1.5 V
Lead Channel Setting Pacing Amplitude: 2.5 V
Lead Channel Setting Pacing Pulse Width: 0.4 ms
Lead Channel Setting Sensing Sensitivity: 0.9 mV

## 2021-02-11 ENCOUNTER — Telehealth: Payer: Self-pay | Admitting: Internal Medicine

## 2021-02-11 NOTE — Telephone Encounter (Signed)
Spoke with pt who states she has not received her letter re: ablation instructions.  Pt advised letter was mailed on 01/27/2021.  Reviewed instructions (see letter for complete details)  A copy of letter has been placed at front desk for pt to pick up when she comes for labs on 02/16/2021.  Pt verbalized understanding of all instructions provided and agrees with current plan.

## 2021-02-11 NOTE — Progress Notes (Signed)
Remote pacemaker transmission.   

## 2021-02-11 NOTE — Telephone Encounter (Signed)
Patient called to talk with Dr. Caryl Comes or nurse. Please call back

## 2021-02-15 ENCOUNTER — Other Ambulatory Visit: Payer: Self-pay | Admitting: Cardiovascular Disease

## 2021-02-15 NOTE — Telephone Encounter (Signed)
Eliquis 5mg  refill request received. Patient is 87 years old, weight-63.5kg, Crea-0.94 on 11/23/2020, Diagnosis-Afib, and last seen by Dr. Caryl Comes on 12/31/2020. Dose is appropriate based on dosing criteria. Will send in refill to requested pharmacy.

## 2021-02-16 ENCOUNTER — Other Ambulatory Visit: Payer: Self-pay

## 2021-02-16 ENCOUNTER — Telehealth: Payer: Self-pay | Admitting: Internal Medicine

## 2021-02-16 ENCOUNTER — Encounter: Payer: Self-pay | Admitting: Internal Medicine

## 2021-02-16 ENCOUNTER — Other Ambulatory Visit (HOSPITAL_COMMUNITY): Payer: Medicare Other

## 2021-02-16 ENCOUNTER — Other Ambulatory Visit: Payer: Medicare Other | Admitting: *Deleted

## 2021-02-16 DIAGNOSIS — I4891 Unspecified atrial fibrillation: Secondary | ICD-10-CM

## 2021-02-16 DIAGNOSIS — Z7901 Long term (current) use of anticoagulants: Secondary | ICD-10-CM | POA: Diagnosis not present

## 2021-02-16 DIAGNOSIS — Z01812 Encounter for preprocedural laboratory examination: Secondary | ICD-10-CM

## 2021-02-16 DIAGNOSIS — I471 Supraventricular tachycardia: Secondary | ICD-10-CM

## 2021-02-16 LAB — BASIC METABOLIC PANEL
BUN/Creatinine Ratio: 24 (ref 12–28)
BUN: 21 mg/dL (ref 8–27)
CO2: 23 mmol/L (ref 20–29)
Calcium: 9.7 mg/dL (ref 8.7–10.3)
Chloride: 109 mmol/L — ABNORMAL HIGH (ref 96–106)
Creatinine, Ser: 0.89 mg/dL (ref 0.57–1.00)
Glucose: 100 mg/dL — ABNORMAL HIGH (ref 65–99)
Potassium: 4.2 mmol/L (ref 3.5–5.2)
Sodium: 143 mmol/L (ref 134–144)
eGFR: 63 mL/min/{1.73_m2} (ref >59)

## 2021-02-16 LAB — CBC
Hematocrit: 43.4 % (ref 34.0–46.6)
Hemoglobin: 14.6 g/dL (ref 11.1–15.9)
MCH: 32.3 pg (ref 26.6–33.0)
MCHC: 33.6 g/dL (ref 31.5–35.7)
MCV: 96 fL (ref 79–97)
Platelets: 127 10*3/uL — ABNORMAL LOW (ref 150–450)
RBC: 4.52 x10E6/uL (ref 3.77–5.28)
RDW: 14.6 % (ref 11.7–15.4)
WBC: 6.5 10*3/uL (ref 3.4–10.8)

## 2021-02-16 NOTE — Telephone Encounter (Signed)
    Pt is returning call, she said she will be at covid testing center tomorrow at 8 am

## 2021-02-16 NOTE — Telephone Encounter (Signed)
error 

## 2021-02-16 NOTE — Telephone Encounter (Signed)
Attempted phone call to pt.  Left voicemail message to contact RN at [phone]. 

## 2021-02-16 NOTE — Telephone Encounter (Signed)
Patient calling to reschedule her covid test for later today. She states she has to have the coivd test today for her procedure. She states she needs a call back as soon as possible.

## 2021-02-17 ENCOUNTER — Telehealth: Payer: Self-pay

## 2021-02-17 ENCOUNTER — Other Ambulatory Visit (HOSPITAL_COMMUNITY)
Admission: RE | Admit: 2021-02-17 | Discharge: 2021-02-17 | Disposition: A | Discharge status: Home / Self Care | Payer: Medicare Other | Source: Ambulatory Visit | Attending: Internal Medicine | Admitting: Internal Medicine

## 2021-02-17 ENCOUNTER — Other Ambulatory Visit (HOSPITAL_COMMUNITY): Admission: RE | Admit: 2021-02-17 | Payer: Medicare Other | Source: Ambulatory Visit

## 2021-02-17 DIAGNOSIS — Z01812 Encounter for preprocedural laboratory examination: Secondary | ICD-10-CM | POA: Diagnosis not present

## 2021-02-17 DIAGNOSIS — Z20822 Contact with and (suspected) exposure to covid-19: Secondary | ICD-10-CM | POA: Insufficient documentation

## 2021-02-17 LAB — SARS CORONAVIRUS 2 (TAT 6-24 HRS): SARS Coronavirus 2: NEGATIVE

## 2021-02-17 NOTE — Telephone Encounter (Signed)
Spoke with pt earlier today and advised of schedule change for pt's AV node ablation.  Pt will need to arrive at [facility] 02/18/2021 at 830am for 1030am procedure.  Pt verbalizes understanding and agrees with current plan.

## 2021-02-17 NOTE — Pre-Procedure Instructions (Signed)
Attempted to call patient regarding procedure instructions for tomorrow.  Left voicemail on the following items: Arrival time 0830 Nothing to eat or drink after midnight No meds AM of procedure Responsible person to drive you home and stay with you for 24 hrs  Have you missed any doses of anti-coagulant Elquis- take both doses today, don't take any in the morning.

## 2021-02-18 ENCOUNTER — Encounter (HOSPITAL_COMMUNITY): Payer: Self-pay | Admitting: Internal Medicine

## 2021-02-18 ENCOUNTER — Ambulatory Visit (HOSPITAL_COMMUNITY)
Admission: RE | Admit: 2021-02-18 | Discharge: 2021-02-19 | Disposition: A | Discharge status: Home / Self Care | Payer: Medicare Other | Attending: Internal Medicine | Admitting: Internal Medicine

## 2021-02-18 ENCOUNTER — Encounter (HOSPITAL_COMMUNITY)
Admission: RE | Disposition: A | Discharge status: Home / Self Care | Payer: Self-pay | Source: Home / Self Care | Attending: Internal Medicine

## 2021-02-18 ENCOUNTER — Other Ambulatory Visit: Payer: Self-pay

## 2021-02-18 DIAGNOSIS — Z823 Family history of stroke: Secondary | ICD-10-CM | POA: Insufficient documentation

## 2021-02-18 DIAGNOSIS — Z883 Allergy status to other anti-infective agents status: Secondary | ICD-10-CM | POA: Diagnosis not present

## 2021-02-18 DIAGNOSIS — Z950 Presence of cardiac pacemaker: Secondary | ICD-10-CM | POA: Diagnosis not present

## 2021-02-18 DIAGNOSIS — Z79899 Other long term (current) drug therapy: Secondary | ICD-10-CM | POA: Diagnosis not present

## 2021-02-18 DIAGNOSIS — Z87891 Personal history of nicotine dependence: Secondary | ICD-10-CM | POA: Insufficient documentation

## 2021-02-18 DIAGNOSIS — Z888 Allergy status to other drugs, medicaments and biological substances status: Secondary | ICD-10-CM | POA: Diagnosis not present

## 2021-02-18 DIAGNOSIS — I495 Sick sinus syndrome: Secondary | ICD-10-CM | POA: Insufficient documentation

## 2021-02-18 DIAGNOSIS — Z8249 Family history of ischemic heart disease and other diseases of the circulatory system: Secondary | ICD-10-CM | POA: Diagnosis not present

## 2021-02-18 DIAGNOSIS — I4510 Unspecified right bundle-branch block: Secondary | ICD-10-CM | POA: Diagnosis not present

## 2021-02-18 DIAGNOSIS — I10 Essential (primary) hypertension: Secondary | ICD-10-CM | POA: Diagnosis not present

## 2021-02-18 DIAGNOSIS — I480 Paroxysmal atrial fibrillation: Secondary | ICD-10-CM | POA: Diagnosis not present

## 2021-02-18 DIAGNOSIS — Z8673 Personal history of transient ischemic attack (TIA), and cerebral infarction without residual deficits: Secondary | ICD-10-CM | POA: Diagnosis not present

## 2021-02-18 DIAGNOSIS — I442 Atrioventricular block, complete: Secondary | ICD-10-CM | POA: Diagnosis not present

## 2021-02-18 DIAGNOSIS — Z86718 Personal history of other venous thrombosis and embolism: Secondary | ICD-10-CM | POA: Insufficient documentation

## 2021-02-18 DIAGNOSIS — I4891 Unspecified atrial fibrillation: Secondary | ICD-10-CM

## 2021-02-18 DIAGNOSIS — I4819 Other persistent atrial fibrillation: Secondary | ICD-10-CM

## 2021-02-18 HISTORY — PX: AV NODE ABLATION: EP1193

## 2021-02-18 SURGERY — AV NODE ABLATION

## 2021-02-18 MED ORDER — VITAMIN B-12 1000 MCG PO TABS
1000.0000 ug | ORAL_TABLET | Freq: Every day | ORAL | Status: DC
  Administered 2021-02-19: 1000 ug via ORAL
  Filled 2021-02-18 (×2): qty 1

## 2021-02-18 MED ORDER — DILTIAZEM HCL ER COATED BEADS 120 MG PO CP24
120.0000 mg | ORAL_CAPSULE | Freq: Every day | ORAL | Status: DC
Start: 2021-02-18 — End: 2021-02-19
  Administered 2021-02-18 – 2021-02-19 (×2): 120 mg via ORAL
  Filled 2021-02-18 (×2): qty 1

## 2021-02-18 MED ORDER — LORAZEPAM 0.5 MG PO TABS: 0.5000 mg | ORAL_TABLET | Freq: Every day | ORAL | Status: DC | PRN

## 2021-02-18 MED ORDER — MIDAZOLAM HCL 5 MG/5ML IJ SOLN
INTRAMUSCULAR | Status: DC | PRN
  Administered 2021-02-18: 1 mg via INTRAVENOUS

## 2021-02-18 MED ORDER — METOPROLOL SUCCINATE ER 50 MG PO TB24
50.0000 mg | ORAL_TABLET | Freq: Every evening | ORAL | Status: DC
  Administered 2021-02-18: 50 mg via ORAL
  Filled 2021-02-18: qty 1

## 2021-02-18 MED ORDER — ONDANSETRON HCL 4 MG/2ML IJ SOLN: 4.0000 mg | Freq: Four times a day (QID) | INTRAMUSCULAR | Status: DC | PRN

## 2021-02-18 MED ORDER — SALINE SPRAY 0.65 % NA SOLN: 1.0000 | NASAL | Status: DC | PRN

## 2021-02-18 MED ORDER — ERYTHROMYCIN 5 MG/GM OP OINT: 1.0000 "application " | TOPICAL_OINTMENT | Freq: Every day | OPHTHALMIC | Status: DC

## 2021-02-18 MED ORDER — HEPARIN (PORCINE) IN NACL 1000-0.9 UT/500ML-% IV SOLN
INTRAVENOUS | Status: AC
  Filled 2021-02-18: qty 500

## 2021-02-18 MED ORDER — APIXABAN 5 MG PO TABS
5.0000 mg | ORAL_TABLET | Freq: Two times a day (BID) | ORAL | Status: DC
  Administered 2021-02-18 – 2021-02-19 (×3): 5 mg via ORAL
  Filled 2021-02-18 (×3): qty 1

## 2021-02-18 MED ORDER — SODIUM CHLORIDE 0.9% FLUSH
3.0000 mL | Freq: Two times a day (BID) | INTRAVENOUS | Status: DC
  Administered 2021-02-18 (×2): 3 mL via INTRAVENOUS

## 2021-02-18 MED ORDER — BUPIVACAINE HCL (PF) 0.25 % IJ SOLN
INTRAMUSCULAR | Status: DC | PRN
  Administered 2021-02-18: 20 mL

## 2021-02-18 MED ORDER — APOAEQUORIN 10 MG PO CAPS: 10.0000 mg | ORAL_CAPSULE | Freq: Every day | ORAL | Status: DC

## 2021-02-18 MED ORDER — SODIUM CHLORIDE 0.9% FLUSH: 3.0000 mL | INTRAVENOUS | Status: DC | PRN

## 2021-02-18 MED ORDER — BUPIVACAINE HCL (PF) 0.25 % IJ SOLN
INTRAMUSCULAR | Status: AC
  Filled 2021-02-18: qty 30

## 2021-02-18 MED ORDER — VITAMIN D 25 MCG (1000 UNIT) PO TABS
2000.0000 [IU] | ORAL_TABLET | Freq: Every day | ORAL | Status: DC
  Administered 2021-02-18 – 2021-02-19 (×2): 2000 [IU] via ORAL
  Filled 2021-02-18 (×2): qty 2

## 2021-02-18 MED ORDER — FENTANYL CITRATE (PF) 100 MCG/2ML IJ SOLN
INTRAMUSCULAR | Status: AC
  Filled 2021-02-18: qty 2

## 2021-02-18 MED ORDER — ACETAMINOPHEN 325 MG PO TABS: 650.0000 mg | ORAL_TABLET | ORAL | Status: DC | PRN

## 2021-02-18 MED ORDER — BEANO PO TABS: 1.0000 | ORAL_TABLET | Freq: Three times a day (TID) | ORAL | Status: DC

## 2021-02-18 MED ORDER — VERAPAMIL HCL 2.5 MG/ML IV SOLN
INTRAVENOUS | Status: DC | PRN
  Administered 2021-02-18: 2.5 mg via INTRAVENOUS

## 2021-02-18 MED ORDER — MIDAZOLAM HCL 5 MG/5ML IJ SOLN
INTRAMUSCULAR | Status: AC
  Filled 2021-02-18: qty 5

## 2021-02-18 MED ORDER — SODIUM CHLORIDE 0.9 % IV SOLN: 250.0000 mL | INTRAVENOUS | Status: DC | PRN

## 2021-02-18 MED ORDER — SODIUM CHLORIDE 0.9 % IV SOLN: INTRAVENOUS | Status: DC

## 2021-02-18 MED ORDER — HEPARIN (PORCINE) IN NACL 1000-0.9 UT/500ML-% IV SOLN
INTRAVENOUS | Status: DC | PRN
  Administered 2021-02-18: 500 mL

## 2021-02-18 SURGICAL SUPPLY — 6 items
CATH THERMISTOR 7FR 4MM (ABLATOR) ×1 IMPLANT
INTRODUCER SWARTZ SRO 8F (SHEATH) ×1 IMPLANT
PACK EP LATEX FREE (CUSTOM PROCEDURE TRAY) ×2
PACK EP LF (CUSTOM PROCEDURE TRAY) ×1 IMPLANT
PAD PRO RADIOLUCENT 2001M-C (PAD) ×2 IMPLANT
SHEATH PINNACLE 8F 10CM (SHEATH) ×1 IMPLANT

## 2021-02-18 NOTE — H&P (Signed)
Patient Care Team: Lajean Manes, MD as PCP - General (Internal Medicine) Deboraha Sprang, MD as PCP - Electrophysiology (Cardiology) Josue Hector, MD as PCP - Cardiology (Cardiology) Key, Nelia Shi, NP as Nurse Practitioner (Gynecology)   HPI  [name] [name] is a 87 y.o. female admitted for AV ablation for increasing burden of Afib with RVR ( incl > 200) assoc with  dyspnea and lightheadedness and some balance issues. Oftentimes however, she is unaware of it.   Her device interrogation suggest that she is in atrial fibrillation persistently almost always and that her average heart rate is frequently over 100 and her peak heart rates are over 200.  The patient denies chest pain, nocturnal dyspnea, orthopnea or peripheral edema or syncope, she has had palpitations dyspnea and assoc LH    DATE TEST EF   8/15 Echo   55-65 %   1/19 Echo   55-65 % Mild LAE  8/21 Echo  55-60%    Prior antiarrhythmic drugs include propafenone and amiodarone; dofetilide precluded by QT prolongation   Date Cr K Hgb  6/18 0.89  13.6  1/19  1.01 3.5 13.3  9/20 1.16 4.3 14.7  3/22 0.89 4.2 81.8    Thromboembolic risk factors ( age  -2, HTN-1, CVA -2, Gender-1) for a CHADSVASc Score of >=6  Records and Results Reviewed   Past Medical History:  Diagnosis Date  . Arthritis   . CVA    Lacunar and cerebellar infarct  . Depression   . DVT (deep venous thrombosis) ([place])    Details not available in the chart  . Edema   . Mitral valve regurgitation    a. echo 2009 mild to moderate MR  . Paroxysmal atrial fibrillation (HCC)   . Pneumonia    as a child  . Presence of permanent cardiac pacemaker   . Sinoatrial node dysfunction (HCC)   . Unspecified essential hypertension     Past Surgical History:  Procedure Laterality Date  . CLOSED REDUCTION ULNAR SHAFT Right 11/15/2018   Procedure: CLOSED REDUCTION RIGHT DISTAL ULNAR FRACTURE;  Surgeon: Charlotte Crumb, MD;  Location:  [facility];  Service: Orthopedics;  Laterality: Right;  . OPEN REDUCTION INTERNAL FIXATION (ORIF) DISTAL RADIAL FRACTURE Right 11/15/2018   Procedure: OPEN REDUCTION INTERNAL FIXATION (ORIF) RIGHT DISTAL RADIAL FRACTURE;  Surgeon: Charlotte Crumb, MD;  Location: [facility];  Service: Orthopedics;  Laterality: Right;  . PACEMAKER INSERTION  09-14-2005   MDT EnRhythm dual chamber pacemaker implanted by Dr Lovena Le for SND  . PPM GENERATOR CHANGEOUT N/A 05/07/2017   Procedure: PPM Generator Changeout;  Surgeon: Deboraha Sprang, MD;  Location: [facility];  Service: Cardiovascular;  Laterality: N/A;  . TONSILLECTOMY      No current facility-administered medications for this encounter.    Allergies  Allergen Reactions  . Gluten Meal Diarrhea  . Itraconazole Diarrhea and Itching  . Lactose Intolerance (Gi) Diarrhea and Nausea Only  . Statins Other (See Comments)    Myalgias      Social History   Tobacco Use  . Smoking status: Former Smoker    Quit date: 08/13/1984    Years since quitting: 36.5  . Smokeless tobacco: Never Used  Vaping Use  . Vaping Use: Never used  Substance Use Topics  . Alcohol use: Yes    Alcohol/week: 14.0 standard drinks    Types: 14 Glasses of wine per week  . Drug use: No     Family  History  Problem Relation Age of Onset  . Cancer Mother   . Mitral valve prolapse Mother   . Diabetes Father   . Hypertension Father   . Coronary artery disease Sister   . Stroke Brother   . Coronary artery disease Sister      Current Meds  Medication Sig  . Alpha-D-Galactosidase (BEANO) TABS Take 1 tablet by mouth 3 (three) times daily before meals.  Marland Kitchen Apoaequorin 10 MG CAPS Take 10 mg by mouth daily.   . Cholecalciferol (VITAMIN D3) 2000 UNITS TABS Take 2,000 Units by mouth daily.  Marland Kitchen desonide (DESOWEN) 0.05 % ointment Apply 1 application topically 2 (two) times daily as needed (for irritation).   Marland Kitchen diltiazem (CARDIZEM CD) 120 MG 24 hr capsule TAKE (1) CAPSULE TWICE  DAILY. (Patient taking differently: Take 120 mg by mouth in the morning and at bedtime.)  . diltiazem (CARDIZEM) 30 MG tablet TAKE 1 TABLET ONCE DAILY AS NEEDED FOR PALPATATION. (Patient taking differently: Take 30 mg by mouth daily as needed (PALPATATION). TAKE 1 TABLET ONCE DAILY AS NEEDED FOR PALPATATION.)  . erythromycin ophthalmic ointment Place 1 application into the left eye at bedtime.  . Lactase (LACTAID PO) Take 1 tablet by mouth daily as needed (Lactose Intolerance).  . loratadine (CLARITIN) 10 MG tablet Take 1 tablet (10 mg total) by mouth daily. For allergies, congestion (Patient taking differently: Take 10 mg by mouth daily as needed for allergies. For allergies, congestion)  . LORazepam (ATIVAN) 0.5 MG tablet Take 0.5 mg by mouth daily as needed for anxiety or sleep.  . metoprolol succinate (TOPROL-XL) 50 MG 24 hr tablet Take 1 tablet (50 mg total) by mouth every evening. Take with or immediately following a meal.  . Polyvinyl Alcohol-Povidone (REFRESH OP) Place 1 drop into both eyes 2 (two) times daily as needed (for dry eyes).   . sodium chloride (OCEAN) 0.65 % SOLN nasal spray Place 1 spray into both nostrils as needed for congestion.  . vitamin B-12 (CYANOCOBALAMIN) 1000 MCG tablet Take 1,000 mcg by mouth daily.  . [DISCONTINUED] ELIQUIS 5 MG TABS tablet TAKE 1 TABLET BY MOUTH TWICE DAILY. (Patient taking differently: Take 5 mg by mouth 2 (two) times daily.)     Review of Systems negative except from HPI and PMH  Physical Exam BP 115/76   Pulse (!) 36   Temp (!) 97.4 F (36.3 C) (Oral)   Ht 5\' 6"  (1.676 m)   Wt 63.5 kg   SpO2 95%   BMI 22.60 kg/m  Well developed and well nourished in no acute distress HENT normal E scleral and icterus clear Neck Supple JVP flat; carotids brisk and full Clear to ausculation Irregularly irregular rate and rhythm, no murmurs gallops or rub Soft with active bowel sounds No clubbing cyanosis none Edema Alert and oriented, grossly  normal motor and sensory function Skin Warm and Dry    Assessment and  Plan  Atrial fibrillation-paroxysmal  Hypertension   Sinus node dysfunction   Right bundle branch block  Pacemaker-Medtronic    Atrial fib with poorly controlled VR despite BB CCB and has no rhythm control issues.  Admitted for AV ablation   Reviewed physiology risks and benefits

## 2021-02-18 NOTE — Progress Notes (Signed)
SITE AREA: right femoral/groin  SITE PRIOR TO REMOVAL:  LEVEL 0  PRESSURE APPLIED FOR: approximately 12 minutes  MANUAL:  YES  PATIENT STATUS DURING PULL: wnl  POST PULL SITE:  LEVEL 0  POST PULL INSTRUCTIONS GIVEN: YES  POST PULL PULSES PRESENT: pedal pulses at +2 bilaterally  DRESSING APPLIED: gauze with tegaderm  BEDREST BEGINS @ 1200  COMMENTS:

## 2021-02-19 DIAGNOSIS — I4891 Unspecified atrial fibrillation: Secondary | ICD-10-CM

## 2021-02-19 DIAGNOSIS — I480 Paroxysmal atrial fibrillation: Secondary | ICD-10-CM | POA: Diagnosis not present

## 2021-02-19 DIAGNOSIS — Z8673 Personal history of transient ischemic attack (TIA), and cerebral infarction without residual deficits: Secondary | ICD-10-CM | POA: Diagnosis not present

## 2021-02-19 DIAGNOSIS — I4510 Unspecified right bundle-branch block: Secondary | ICD-10-CM | POA: Diagnosis not present

## 2021-02-19 DIAGNOSIS — Z86718 Personal history of other venous thrombosis and embolism: Secondary | ICD-10-CM | POA: Diagnosis not present

## 2021-02-19 DIAGNOSIS — Z888 Allergy status to other drugs, medicaments and biological substances status: Secondary | ICD-10-CM | POA: Diagnosis not present

## 2021-02-19 DIAGNOSIS — Z883 Allergy status to other anti-infective agents status: Secondary | ICD-10-CM | POA: Diagnosis not present

## 2021-02-19 DIAGNOSIS — Z87891 Personal history of nicotine dependence: Secondary | ICD-10-CM | POA: Diagnosis not present

## 2021-02-19 DIAGNOSIS — Z950 Presence of cardiac pacemaker: Secondary | ICD-10-CM | POA: Diagnosis not present

## 2021-02-19 DIAGNOSIS — I10 Essential (primary) hypertension: Secondary | ICD-10-CM | POA: Diagnosis not present

## 2021-02-19 DIAGNOSIS — Z79899 Other long term (current) drug therapy: Secondary | ICD-10-CM | POA: Diagnosis not present

## 2021-02-19 DIAGNOSIS — I495 Sick sinus syndrome: Secondary | ICD-10-CM | POA: Diagnosis not present

## 2021-02-19 DIAGNOSIS — I442 Atrioventricular block, complete: Secondary | ICD-10-CM | POA: Diagnosis not present

## 2021-02-19 MED ORDER — DILTIAZEM HCL ER COATED BEADS 120 MG PO CP24: 120.0000 mg | ORAL_CAPSULE | Freq: Every day | ORAL | 2 refills | Status: DC

## 2021-02-19 NOTE — Discharge Summary (Signed)
Discharge Summary    Patient ID: [name] [name] MRN: [id number]; DOB: 1932/08/02  Admit date: 02/18/2021 Discharge date: 02/19/2021  PCP:  Lajean Manes, [place]  Cardiologist:  Jenkins Rouge, MD  Electrophysiologist:  Virl Axe, MD    Discharge Diagnoses    Principal Problem:   SICK SINUS SYNDROME Active Problems:   Essential hypertension   Persistent atrial fibrillation ([place])   PACEMAKER-Medtronic   Heart block AV complete [facility])    Diagnostic Studies/Procedures    AV NODE ABLATION    Procedural Details  Technical Details HLEE FRINGER [id number]  @CSN @  Preop Dx: afib with RVR and previously implanted pacemaker Postop Dx same/   Procedure:His recording and AV ablation  Cx: None  Following the obtaining of informed consent she was brought to the EP lab and placed on the flouroscopic table in the supine position.  After routine prep and drape, lidocaine was infiltrated in the right groin and venous access was obtained without difficulty   An 36F sheath--SRO was placed and through this was placed a 4 mm tip catheter.  The HV was measured at 48 ms.  A total of 45 seconds of RF energy was applied with interruption of antegrade AV conduction.  A total of less than 1 minute of fluoroscopy was used.  The long sheath was replaced by a short 8 [place] sheath and the patient was transferred to the holding area in stable condition for sheath removal.  It was noted that she had ectopy appearing to emanate from the right ventricular outflow tract, left bundle inferior axis, and her heart rate was programmed at 95 instead of 90 as it was associate with about a 50% suppression of the ectopic burden.       Virl Axe, MD 02/18/2021 11:14 AM  Estimated blood loss <50 mL.   During this procedure medications were administered to achieve and maintain moderate conscious sedation while the patient's heart rate, blood pressure,  and oxygen saturation were continuously monitored and I was present face-to-face 100% of this time.      History of Present Illness     [name] [name] is a 87 y.o. female with history of persistent atrial fibrillation, tachybradycardia syndrome s/p pacemaker, hypertension, CVA and DVT presented for A. fib ablation.  Recent device check showed increasing burden of Afib with RVR ( incl > 200) assoc with  dyspnea and lightheadedness and some balance issues.Oftentimes however, she is unaware of it.   Recommended ablation given poorly controlled ventricular response despite calcium channel and beta-blocker  Hospital Course     Consultants: none  Patient presented and underwent successful AV nodal ablation.  No overnight complications.  The patient was seen and examined by Dr. Caryl Comes and felt stable for discharge.  No change in home medication except taking Cardizem CD 120 mg daily from twice daily dose..  Will arrange follow-up in device clinic in 2 weeks.   Discharge Vitals Blood pressure 113/77, pulse 95, temperature 97.9 F (36.6 C), temperature source Oral, resp. rate 18, height 5\' 6"  (1.676 m), weight 63.8 kg, SpO2 92 %.  Filed Weights   02/18/21 0910 02/19/21 0300  Weight: 63.5 kg 63.8 kg    Labs & Radiologic Studies    CBC Recent Labs    02/16/21 1326  WBC 6.5  HGB 14.6  HCT 43.4  MCV 96  PLT 939*   Basic Metabolic Panel Recent Labs    02/16/21 1326  NA 143  K 4.2  CL 109*  CO2 23  GLUCOSE 100*  BUN 21  CREATININE 0.89  CALCIUM 9.7   _____________  CUP PACEART REMOTE DEVICE CHECK  Result Date: 02/06/2021 Scheduled remote reviewed. A burden 64.6%.  EGMs appear a fib, v rate poorly controlled.  1 NSVT appears atrially driven, likely SVT.  Histogram show rate greater than 110bpm ~ 50% of time.  Known PAF.  Meds: Diltiazem, Eliquis, Toprol XL.  Per epic notes planned AV node ablation for 02/18/21 w/ Dr Lequita Halt.  Normal device function.  R. Powers, CVRS Next  remote 91 days.  Disposition   Pt is being discharged home today in good condition.  Follow-up Plans & Appointments     Follow-up Information    Shirley Friar, PA-C. Go on 03/21/2021.   Specialty: Physician Assistant Why: @11 :30 Contact information: [address] [place] [address] [phone]              Discharge Instructions    Diet - low sodium heart healthy   Complete by: As directed    Increase activity slowly   Complete by: As directed       Discharge Medications   Allergies as of 02/19/2021      Reactions   Gluten Meal Diarrhea   Itraconazole Diarrhea, Itching   Lactose Intolerance (gi) Diarrhea, Nausea Only   Statins Other (See Comments)   Myalgias      Medication List    TAKE these medications   Apoaequorin 10 MG Caps Take 10 mg by mouth daily.   Beano Tabs Take 1 tablet by mouth 3 (three) times daily before meals.   desonide 0.05 % ointment Commonly known as: DESOWEN Apply 1 application topically 2 (two) times daily as needed (for irritation).   diltiazem 120 MG 24 hr capsule Commonly known as: CARDIZEM CD Take 1 capsule (120 mg total) by mouth daily. What changed: See the new instructions.   diltiazem 30 MG tablet Commonly known as: CARDIZEM TAKE 1 TABLET ONCE DAILY AS NEEDED FOR PALPATATION. What changed:   how much to take  how to take this  when to take this  reasons to take this   Eliquis 5 MG Tabs tablet Generic drug: apixaban TAKE 1 TABLET BY MOUTH TWICE DAILY.   erythromycin ophthalmic ointment Place 1 application into the left eye at bedtime.   LACTAID PO Take 1 tablet by mouth daily as needed (Lactose Intolerance).   loratadine 10 MG tablet Commonly known as: CLARITIN Take 1 tablet (10 mg total) by mouth daily. For allergies, congestion What changed:   when to take this  reasons to take this   LORazepam 0.5 MG tablet Commonly known as: ATIVAN Take 0.5 mg by mouth daily as needed  for anxiety or sleep.   metoprolol succinate 50 MG 24 hr tablet Commonly known as: TOPROL-XL Take 1 tablet (50 mg total) by mouth every evening. Take with or immediately following a meal.   REFRESH OP Place 1 drop into both eyes 2 (two) times daily as needed (for dry eyes).   sodium chloride 0.65 % Soln nasal spray Commonly known as: OCEAN Place 1 spray into both nostrils as needed for congestion.   vitamin B-12 1000 MCG tablet Commonly known as: CYANOCOBALAMIN Take 1,000 mcg by mouth daily.   Vitamin D3 50 MCG (2000 UT) Tabs Take 2,000 Units by mouth daily.          Outstanding Labs/Studies   Device check in 2  weeks  Duration of Discharge Encounter   Greater than 30 minutes including physician time.  Jarrett Soho, PA 02/19/2021, 11:32 AM

## 2021-02-22 ENCOUNTER — Telehealth: Payer: Self-pay

## 2021-02-22 NOTE — Telephone Encounter (Signed)
Spoke with pt, there are no avail DC appts at a time she can make it.    Scheduled pt with Tommye Standard, PA on 03/03/21 1:45pm.

## 2021-03-03 ENCOUNTER — Other Ambulatory Visit: Payer: Self-pay

## 2021-03-03 ENCOUNTER — Encounter: Payer: Self-pay | Admitting: Physician Assistant

## 2021-03-03 ENCOUNTER — Ambulatory Visit (INDEPENDENT_AMBULATORY_CARE_PROVIDER_SITE_OTHER): Payer: Medicare Other | Admitting: Physician Assistant

## 2021-03-03 VITALS — BP 132/76 | HR 95 | Ht 66.0 in | Wt 134.0 lb

## 2021-03-03 DIAGNOSIS — I4891 Unspecified atrial fibrillation: Secondary | ICD-10-CM | POA: Diagnosis not present

## 2021-03-03 DIAGNOSIS — I4821 Permanent atrial fibrillation: Secondary | ICD-10-CM

## 2021-03-03 DIAGNOSIS — I10 Essential (primary) hypertension: Secondary | ICD-10-CM

## 2021-03-03 DIAGNOSIS — Z950 Presence of cardiac pacemaker: Secondary | ICD-10-CM | POA: Diagnosis not present

## 2021-03-03 LAB — CUP PACEART INCLINIC DEVICE CHECK
Battery Remaining Longevity: 136 mo
Battery Voltage: 2.98 V
Brady Statistic AP VP Percent: 0 %
Brady Statistic AP VS Percent: 0 %
Brady Statistic AS VP Percent: 99.59 %
Brady Statistic AS VS Percent: 0.45 %
Brady Statistic RA Percent Paced: 0 %
Brady Statistic RV Percent Paced: 99.03 %
Date Time Interrogation Session: 20220407162504
Implantable Lead Implant Date: 20061019
Implantable Lead Implant Date: 20061019
Implantable Lead Location: 753859
Implantable Lead Location: 753860
Implantable Lead Model: 5076
Implantable Pulse Generator Implant Date: 20180611
Lead Channel Impedance Value: 361 Ohm
Lead Channel Impedance Value: 418 Ohm
Lead Channel Impedance Value: 646 Ohm
Lead Channel Impedance Value: 703 Ohm
Lead Channel Pacing Threshold Amplitude: 0.625 V
Lead Channel Pacing Threshold Amplitude: 1 V
Lead Channel Pacing Threshold Pulse Width: 0.4 ms
Lead Channel Pacing Threshold Pulse Width: 0.4 ms
Lead Channel Sensing Intrinsic Amplitude: 1.375 mV
Lead Channel Sensing Intrinsic Amplitude: 1.5 mV
Lead Channel Sensing Intrinsic Amplitude: 3.25 mV
Lead Channel Sensing Intrinsic Amplitude: 3.375 mV
Lead Channel Setting Pacing Amplitude: 2.5 V
Lead Channel Setting Pacing Pulse Width: 0.4 ms
Lead Channel Setting Sensing Sensitivity: 0.9 mV

## 2021-03-03 NOTE — Patient Instructions (Addendum)
Medication Instructions:   Your physician recommends that you continue on your current medications as directed. Please refer to the Current Medication list given to you today.  *If you need a refill on your cardiac medications before your next appointment, please call your pharmacy*   Lab Work: NONE ORDERED  TODAY   If you have labs (blood work) drawn today and your tests are completely normal, you will receive your results only by: . MyChart Message (if you have MyChart) OR . A paper copy in the mail If you have any lab test that is abnormal or we need to change your treatment, we will call you to review the results.   Testing/Procedures: NONE ORDERED  TODAY    Follow-Up: At [facility], you and your health needs are our priority.  As part of our continuing mission to provide you with exceptional heart care, we have created designated Provider Care Teams.  These Care Teams include your primary Cardiologist (physician) and Advanced Practice Providers (APPs -  Physician Assistants and Nurse Practitioners) who all work together to provide you with the care you need, when you need it.  We recommend signing up for the patient portal called "MyChart".  Sign up information is provided on this After Visit Summary.  MyChart is used to connect with patients for Virtual Visits (Telemedicine).  Patients are able to view lab/test results, encounter notes, upcoming appointments, etc.  Non-urgent messages can be sent to your provider as well.   To learn more about what you can do with MyChart, go to [email or web address].    Your next appointment:  AS SCHEDULED   Other Instructions   

## 2021-03-03 NOTE — Progress Notes (Signed)
Cardiology Office Note Date:  03/03/2021  Patient ID:  [name], [name] Aug 06, 1932, MRN [id number] PCP:  [name] Manes, MD  Cardiologist:  Dr. Johnsie Cancel Electrophysiologist: Dr. Caryl Comes    Chief Complaint:   pacer programming  History of Present Illness: [name] [name] is a 87 y.o. female with history of PAFib, tcahy-brady w/PPM, HTN, CVA, DVT.   Falls July 2020, fall with chest wall trauma, she is not entirely sure of the mechanism, said she was coming inside, had something in her had, was struggling with the heavy door and her dog was near by, she seems to recall falling, and does not think she fainted, just not sure what tripped her up Dec 2019: a trip/mechanical fall, broke her R wrist trying to stop herslef Hospitalized 05/2018 after a slip fall accident in her garden resulting in multiple rib fractures and PTX   I saw her Sept 2020 She comes in today to be seen for dr. Caryl Comes.  She was recently seen by A. Albertina [place], [place] 07/11/2019, he mentions she had fallen a few weeks prior to her visit did not seek attention, seemed there was some concern she may had suffered injury to her shoulder, also had bruising to her chest wall. In review of the pacer check done, at that visit noted 11.4% AFib burden, rates not very well controlled in AFib, burden appeared similar to that in the last 12 mo. She was in SR/paced at that visit Device clinic got a AFib burden alert 8/17, with presenting AFib RVR, rates 120's-140's.  Her dilt was increased to BID and asked to come in for evaluation. The patient today feels well.  Sunday she was very aware of her heart racing, did not particularly feel poorly though was uncomfortable, this as the first time she was really aware of her AFib to that extent.  She denies any CP or SOB, no dizzy spells, no near syncope or syncope She is tolerating the BID diltiazem so far without issue She denies any pulmonary concerns, she did not do f/u PFTs in 9mo as  recommended, says was very hard and anxiety provoking for her, she does not want to do them, denies any SOB or pulmonary symptoms, does not want to repeat the test Metoprolol was added back to her regime She denied concerns about her falls, we discussed importance of care/safety.  She saw Dr. Johnsie Cancel Aug 2021, doing well, minimal palpitations, no changes were made.  She last saw Dr. Caryl Comes Sept 2021, GI issues w/diarrhea that improved off metoprolol and on Pepto.  Her HRs off the BB notable for AF rates increased to about 110-120 and the metoprolol resumed.  If GI tolerated, planned to increase dose to 100mg  daily.  Device remote Dec 2021 noted significant up tick in AFib burden and AFib rates as well.  I saw her 11/11/20 She feels quite well. Her diarrhea has stopped and this has been a real burden off her. No CP, denies SOB or DOE. She is quite happy with how she feels of late, including today. She is aware of increased AFib, can feel her heart irregular more oftne, does not perceive it racing can feel "a little fast, but not for long" She denies any symptoms of PND or orthopnea, no edema Denies any more falls No bleeding or signs of bleeding States her PMD manages her cholesterol and recently told was high but can not tolerate the statins, and does not want to take any medicine for it. She is  very irregular about taking her medicines.  Tends to "get to them" when she thinks abut them.  She will sometimes take her morning medicines late morning, and sometimes falls asleep without taking her nighttime meds, other times, has insomnia and will take her AM meds at 2-3AM and PM again, when she thinks to. She feels like for the most part gets them in eventually She is not taking metoprolol at all, this she is certain of, states that at her last visit Dr. Caryl Comes told her to stop the metoprolol and magnesium   Rates 110's-120s and asymptomatic We discussed the importance of taking her medicinces  regularly and every day Toprol XL 50mg  daily started She had missed some Eliquis doses and unable to consider DCCV w/o TEE Given asymptomatic deferred.  I saw her 11/23/21 She is doing OK, she is aware of her AFib and knows that in the last few months is having more, but does not make her feel bad.  Since she was here last this has settled some, and the episodes seem shorter perhaps. She waits a couple hours to take the PRN dilt has used it a couple times sxince we saw her. No CP, no SOB or DOE She is taking the metoprolol and has not developed diarrhea. She admits that she can not be certain of no missed doses, this includes her Eliquis. Do difficulties with her ADLs No dizzy spells, near syncope or syncope. No bleeding or signs of bleeding She arrived in SR though during device checked developed AFlutter, attempts to pace terminate resulted in AFib  She saw DR. Caryl Comes 12/31/20 and recommended AV node ablation She underwent AV node ablation 02/18/21, procedure note mentions PVCs from RVOT and pacing rate 95 to try to suppress these (reduced about 50%)  TODAY She is doing well No CP or SOB She had no post procedure groin pain or site issues. She feels like once in a while she is aware of her hearrt rhythm, but nothing like before. NO dizzy spells, near syncope or syncope  She mentions for years intermittent minimal vaginal bleeding none otherwise and unchanged with her Eliquis No bleeding otherwise   AFib Hx Rate control and AAD hx Intolerant of metoprolol (unclear why) Intolerant of CCB with edema  Rythmol stopped 2/2 ataxia/neurotoxicity at lower doses recurrent AFib June 2019 discussed AFib ablation with Dr. Rayann Heman, though patient did not want to purse this Amiodarone started June 2018  >> Stopped Dec 2019, she says with pulmonary concerns, Noted Dr. Johnsie Cancel stopped with abn PFTs  March 2022, AV node ablation         Device information:  MDT dual chamber PPM, implanted  09/14/05, gen change 05/07/17   Past Medical History:  Diagnosis Date  . Arthritis   . CVA    Lacunar and cerebellar infarct  . Depression   . DVT (deep venous thrombosis) ([place])    Details not available in the chart  . Edema   . Mitral valve regurgitation    a. echo 2009 mild to moderate MR  . Paroxysmal atrial fibrillation (HCC)   . Pneumonia    as a child  . Presence of permanent cardiac pacemaker   . Sinoatrial node dysfunction (HCC)   . Unspecified essential hypertension     Past Surgical History:  Procedure Laterality Date  . AV NODE ABLATION N/A 02/18/2021   Procedure: AV NODE ABLATION;  Surgeon: Deboraha Sprang, MD;  Location: [facility];  Service: Cardiovascular;  Laterality:  N/A;  . CLOSED REDUCTION ULNAR SHAFT Right 11/15/2018   Procedure: CLOSED REDUCTION RIGHT DISTAL ULNAR FRACTURE;  Surgeon: Charlotte Crumb, MD;  Location: [facility];  Service: Orthopedics;  Laterality: Right;  . OPEN REDUCTION INTERNAL FIXATION (ORIF) DISTAL RADIAL FRACTURE Right 11/15/2018   Procedure: OPEN REDUCTION INTERNAL FIXATION (ORIF) RIGHT DISTAL RADIAL FRACTURE;  Surgeon: Charlotte Crumb, MD;  Location: [facility];  Service: Orthopedics;  Laterality: Right;  . PACEMAKER INSERTION  09-14-2005   MDT EnRhythm dual chamber pacemaker implanted by Dr Lovena Le for SND  . PPM GENERATOR CHANGEOUT N/A 05/07/2017   Procedure: PPM Generator Changeout;  Surgeon: Deboraha Sprang, MD;  Location: [facility];  Service: Cardiovascular;  Laterality: N/A;  . TONSILLECTOMY      Current Outpatient Medications  Medication Sig Dispense Refill  . Alpha-D-Galactosidase (BEANO) TABS Take 1 tablet by mouth 3 (three) times daily before meals.    Marland Kitchen Apoaequorin 10 MG CAPS Take 10 mg by mouth daily.     . Cholecalciferol (VITAMIN D3) 2000 UNITS TABS Take 2,000 Units by mouth daily.    Marland Kitchen desonide (DESOWEN) 0.05 % ointment Apply 1 application topically 2 (two) times daily as needed (for irritation).     Marland Kitchen  diltiazem (CARDIZEM CD) 120 MG 24 hr capsule Take 1 capsule (120 mg total) by mouth daily. 90 capsule 2  . diltiazem (CARDIZEM) 30 MG tablet TAKE 1 TABLET ONCE DAILY AS NEEDED FOR PALPATATION. (Patient taking differently: Take 30 mg by mouth daily as needed (PALPATATION). TAKE 1 TABLET ONCE DAILY AS NEEDED FOR PALPATATION.) 90 tablet 0  . ELIQUIS 5 MG TABS tablet TAKE 1 TABLET BY MOUTH TWICE DAILY. 60 tablet 5  . erythromycin ophthalmic ointment Place 1 application into the left eye at bedtime.    . Lactase (LACTAID PO) Take 1 tablet by mouth daily as needed (Lactose Intolerance).    . loratadine (CLARITIN) 10 MG tablet Take 1 tablet (10 mg total) by mouth daily. For allergies, congestion (Patient taking differently: Take 10 mg by mouth daily as needed for allergies. For allergies, congestion) 30 tablet 3  . LORazepam (ATIVAN) 0.5 MG tablet Take 0.5 mg by mouth daily as needed for anxiety or sleep.    . metoprolol succinate (TOPROL-XL) 50 MG 24 hr tablet Take 1 tablet (50 mg total) by mouth every evening. Take with or immediately following a meal. 90 tablet 0  . Polyvinyl Alcohol-Povidone (REFRESH OP) Place 1 drop into both eyes 2 (two) times daily as needed (for dry eyes).     . sodium chloride (OCEAN) 0.65 % SOLN nasal spray Place 1 spray into both nostrils as needed for congestion.    . vitamin B-12 (CYANOCOBALAMIN) 1000 MCG tablet Take 1,000 mcg by mouth daily.     No current facility-administered medications for this visit.    Allergies:   Gluten meal, Itraconazole, Lactose intolerance (gi), and Statins   Social History:  The patient  reports that she quit smoking about 36 years ago. She has never used smokeless tobacco. She reports current alcohol use of about 14.0 standard drinks of alcohol per week. She reports that she does not use drugs.   Family History:  The patient's family history includes Cancer in her mother; Coronary artery disease in her sister and sister; Diabetes in her father;  Hypertension in her father; Mitral valve prolapse in her mother; Stroke in her brother.  ROS:  Please see the history of present illness.  All other systems are reviewed and otherwise  negative.   PHYSICAL EXAM:  VS:  There were no vitals taken for this visit. BMI: There is no height or weight on file to calculate BMI. Well nourished, well developed, in no acute distress  HEENT: normocephalic, atraumatic  Neck: no JVD, carotid bruits or masses Cardiac: RRR(paced); no significant murmurs, no rubs, or gallops Lungs: CTA b/l, no wheezing, rhonchi or rales  Abd: soft, nontender MS: no deformity age appropriate atrophy Ext:   no edema, chronic looking skin changes, spider veins Skin: warm and dry, no rash Neuro:  No gross deficits appreciated Psych: euthymic mood, full affect  PPM site is stable, no tethering or discomfort   EKG:  Done today and reviewed by myself AF, V paced  PPM interrogation done today and reviewed by myself:  Battery and lead measurements are good VVI programmed No R waves at 40 today Rate reduced to 85 today, monitored EGMs for a few minutes, no PVCs noted   06/30/14: TTE - Left ventricle: The cavity size was normal. Systolic function was normal. The estimated ejection fraction was in the range of 50% to 55%. Wall motion was normal; there were no regional wall motion abnormalities. Left ventricular diastolic function parameters were normal. - Aortic valve: Trileaflet; mildly thickened, mildly calcified leaflets. There was no significant regurgitation. - Aortic root: The aortic root was normal in size. - Mitral valve: Calcified annulus. Mildly thickened leaflets . There was mild regurgitation. - Left atrium: The atrium was at the upper limits of normal in size. - Right ventricle: Systolic function was normal. - Right atrium: The atrium was normal in size. - Tricuspid valve: There was moderate regurgitation. - Pulmonary arteries: Systolic  pressure was mildly increased. PA peak pressure: 42 mm Hg (S) Impressions - Normal biventricular size and function. Mild pulmonary hypertension.    Recent Labs: 02/16/2021: BUN 21; Creatinine, Ser 0.89; Hemoglobin 14.6; Platelets 127; Potassium 4.2; Sodium 143  No results found for requested labs within last 8760 hours.   Estimated Creatinine Clearance: 42.5 mL/min (by C-G formula based on SCr of 0.89 mg/dL).   Wt Readings from Last 3 Encounters:  02/19/21 140 lb 11.2 oz (63.8 kg)  12/31/20 140 lb (63.5 kg)  11/23/20 145 lb (65.8 kg)     Other studies reviewed: Additional studies/records reviewed today include: summarized above  ASSESSMENT AND PLAN:  1. Persistent >> permanent AFib     CHA2DS2Vasc is 6 on Eliquis, appropriately dosed     S/p AV node ablation      She is urged to discussed her vaginal bleeding wth her PMD (she does not see GYN any longer), she says her daughter has said the same thing   2. PPM     Intact function     Rate reduced 95 > 85 today  3. HTN     Looks OK   4. Falls     Not an ongoing problem    Disposition: f/u with A. Tillery, PA as scheduled for further rate reduction.   Current medicines are reviewed at length with the patient today.  The patient did not have any concerns regarding medicines.  Haywood Lasso, PA-C 03/03/2021 5:28 AM     [address] [place] [id number] [phone] (office)  [phone] (fax)

## 2021-03-21 ENCOUNTER — Encounter: Payer: Medicare Other | Admitting: Student

## 2021-03-22 ENCOUNTER — Encounter: Payer: Medicare Other | Admitting: Student

## 2021-03-27 NOTE — Progress Notes (Signed)
Cardiology Office Note Date:  03/27/2021  Patient ID:  Elide, Stalzer 1932-07-21, MRN [id number] PCP:  Lajean Manes, MD  Cardiologist:  Dr. Johnsie Cancel Electrophysiologist: Dr. Caryl Comes    Chief Complaint:   planned pacer programming  History of Present Illness: [name] [name] is a 87 y.o. female with history of PAFib, tcahy-brady w/PPM, HTN, CVA, DVT.   Falls July 2020, fall with chest wall trauma, she is not entirely sure of the mechanism, said she was coming inside, had something in her had, was struggling with the heavy door and her dog was near by, she seems to recall falling, and does not think she fainted, just not sure what tripped her up Dec 2019: a trip/mechanical fall, broke her R wrist trying to stop herslef Hospitalized 05/2018 after a slip fall accident in her garden resulting in multiple rib fractures and PTX  She saw Dr. Johnsie Cancel Aug 2021, doing well, minimal palpitations, no changes were made.  She saw DR. Caryl Comes 12/31/20 and recommended AV node ablation She underwent AV node ablation 02/18/21, procedure note mentions PVCs from RVOT and pacing rate 95 to try to suppress these (reduced about 50%)   I saw hew 03/03/21 She is doing well No CP or SOB She had no post procedure groin pain or site issues. She feels like once in a while she is aware of her hearrt rhythm, but nothing like before. No dizzy spells, near syncope or syncope She mentions for years intermittent minimal vaginal bleeding none otherwise and unchanged with her Eliquis No bleeding otherwise Rate was reduced for 95 > 85 She was urged to see her PMD regarding intermittent vag bleeding.   TODAY She feels "pretty good" No CP, occasionally aware of palpitations No dizzy spells, near syncope or syncope. No bleeding or signs of bleeding  She had a trip/fall a few weeks ago, turned to walk from her desk and tripped, to injury   She had one evening some weeks ago that she had ahard time  settling her breathing down to sleep, she though was anxiety but did not want to get up to take a lorazepam, eventually did settle and able to go to sleep, has not happened again    AFib Hx Rate control and AAD hx Intolerant of metoprolol (unclear why) Intolerant of CCB with edema  Rythmol stopped 2/2 ataxia/neurotoxicity at lower doses recurrent AFib June 2019 discussed AFib ablation with Dr. Rayann Heman, though patient did not want to purse this Amiodarone started June 2018  >> Stopped Dec 2019, she says with pulmonary concerns, Noted Dr. Johnsie Cancel stopped with abn PFTs  March 2022, AV node ablation         Device information:  MDT dual chamber PPM, implanted 09/14/05, gen change 05/07/17   Past Medical History:  Diagnosis Date  . Arthritis   . CVA    Lacunar and cerebellar infarct  . Depression   . DVT (deep venous thrombosis) ([facility])    Details not available in the chart  . Edema   . Mitral valve regurgitation    a. echo 2009 mild to moderate MR  . Paroxysmal atrial fibrillation ([facility])   . Pneumonia    as a child  . Presence of permanent cardiac pacemaker   . Sinoatrial node dysfunction ([facility])   . Unspecified essential hypertension     Past Surgical History:  Procedure Laterality Date  . AV NODE ABLATION N/A 02/18/2021   Procedure: AV NODE ABLATION;  Surgeon: Deboraha Sprang, MD;  Location: [facility];  Service: Cardiovascular;  Laterality: N/A;  . CLOSED REDUCTION ULNAR SHAFT Right 11/15/2018   Procedure: CLOSED REDUCTION RIGHT DISTAL ULNAR FRACTURE;  Surgeon: Charlotte Crumb, MD;  Location: [facility];  Service: Orthopedics;  Laterality: Right;  . OPEN REDUCTION INTERNAL FIXATION (ORIF) DISTAL RADIAL FRACTURE Right 11/15/2018   Procedure: OPEN REDUCTION INTERNAL FIXATION (ORIF) RIGHT DISTAL RADIAL FRACTURE;  Surgeon: Charlotte Crumb, MD;  Location: [facility];  Service: Orthopedics;  Laterality: Right;  . PACEMAKER INSERTION  09-14-2005   MDT EnRhythm dual chamber pacemaker  implanted by Dr Lovena Le for SND  . PPM GENERATOR CHANGEOUT N/A 05/07/2017   Procedure: PPM Generator Changeout;  Surgeon: Deboraha Sprang, MD;  Location: [facility];  Service: Cardiovascular;  Laterality: N/A;  . TONSILLECTOMY      Current Outpatient Medications  Medication Sig Dispense Refill  . Alpha-D-Galactosidase (BEANO) TABS Take 1 tablet by mouth 3 (three) times daily before meals.    Marland Kitchen Apoaequorin 10 MG CAPS Take 10 mg by mouth daily.     . Cholecalciferol (VITAMIN D3) 2000 UNITS TABS Take 2,000 Units by mouth daily.    Marland Kitchen desonide (DESOWEN) 0.05 % ointment Apply 1 application topically 2 (two) times daily as needed (for irritation).     Marland Kitchen diltiazem (CARDIZEM CD) 120 MG 24 hr capsule Take 1 capsule (120 mg total) by mouth daily. 90 capsule 2  . diltiazem (CARDIZEM) 30 MG tablet TAKE 1 TABLET ONCE DAILY AS NEEDED FOR PALPATATION. (Patient taking differently: Take 30 mg by mouth daily as needed (PALPATATION). TAKE 1 TABLET ONCE DAILY AS NEEDED FOR PALPATATION.) 90 tablet 0  . ELIQUIS 5 MG TABS tablet TAKE 1 TABLET BY MOUTH TWICE DAILY. 60 tablet 5  . erythromycin ophthalmic ointment Place 1 application into the left eye at bedtime.    . Lactase (LACTAID PO) Take 1 tablet by mouth daily as needed (Lactose Intolerance).    . loratadine (CLARITIN) 10 MG tablet Take 1 tablet (10 mg total) by mouth daily. For allergies, congestion (Patient taking differently: Take 10 mg by mouth daily as needed for allergies. For allergies, congestion) 30 tablet 3  . LORazepam (ATIVAN) 0.5 MG tablet Take 0.5 mg by mouth daily as needed for anxiety or sleep.    . metoprolol succinate (TOPROL-XL) 50 MG 24 hr tablet Take 1 tablet (50 mg total) by mouth every evening. Take with or immediately following a meal. 90 tablet 0  . Polyvinyl Alcohol-Povidone (REFRESH OP) Place 1 drop into both eyes 2 (two) times daily as needed (for dry eyes).     . sodium chloride (OCEAN) 0.65 % SOLN nasal spray Place 1 spray into  both nostrils as needed for congestion.    . vitamin B-12 (CYANOCOBALAMIN) 1000 MCG tablet Take 1,000 mcg by mouth daily.     No current facility-administered medications for this visit.    Allergies:   Gluten meal, Itraconazole, Lactose intolerance (gi), and Statins   Social History:  The patient  reports that she quit smoking about 36 years ago. She has never used smokeless tobacco. She reports current alcohol use of about 14.0 standard drinks of alcohol per week. She reports that she does not use drugs.   Family History:  The patient's family history includes Cancer in her mother; Coronary artery disease in her sister and sister; Diabetes in her father; Hypertension in her father; Mitral valve prolapse in her mother; Stroke in her brother.  ROS:  Please see the history of  present illness.  All other systems are reviewed and otherwise negative.   PHYSICAL EXAM:  VS:  There were no vitals taken for this visit. BMI: There is no height or weight on file to calculate BMI. Well nourished, well developed, in no acute distress  HEENT: normocephalic, atraumatic  Neck: no JVD, carotid bruits or masses Cardiac: irreg-irreg; no significant murmurs, no rubs, or gallops Lungs: CTA b/l, no wheezing, rhonchi or rales  Abd: soft, nontender MS: no deformity age appropriate atrophy Ext:    no edema, chronic looking skin changes, spider veins Skin: warm and dry, no rash Neuro:  No gross deficits appreciated Psych: euthymic mood, full affect  PPM site is stable, no tethering or discomfort   EKG:  Done today and reviewed by myself AFib 99bpm, intermittent V paced beats as well, RBBB   PPM interrogation done today and reviewed by myself:  Battery and lead measurements are good She has regained AV conduction though HRs are largely controlled/paced HR histograms look OK VP 79.6%   06/30/14: TTE - Left ventricle: The cavity size was normal. Systolic function was normal. The estimated ejection  fraction was in the range of 50% to 55%. Wall motion was normal; there were no regional wall motion abnormalities. Left ventricular diastolic function parameters were normal. - Aortic valve: Trileaflet; mildly thickened, mildly calcified leaflets. There was no significant regurgitation. - Aortic root: The aortic root was normal in size. - Mitral valve: Calcified annulus. Mildly thickened leaflets . There was mild regurgitation. - Left atrium: The atrium was at the upper limits of normal in size. - Right ventricle: Systolic function was normal. - Right atrium: The atrium was normal in size. - Tricuspid valve: There was moderate regurgitation. - Pulmonary arteries: Systolic pressure was mildly increased. PA peak pressure: 42 mm Hg (S) Impressions - Normal biventricular size and function. Mild pulmonary hypertension.    Recent Labs: 02/16/2021: BUN 21; Creatinine, Ser 0.89; Hemoglobin 14.6; Platelets 127; Potassium 4.2; Sodium 143  No results found for requested labs within last 8760 hours.   CrCl cannot be calculated (Patient's most recent lab result is older than the maximum 21 days allowed.).   Wt Readings from Last 3 Encounters:  03/03/21 134 lb (60.8 kg)  02/19/21 140 lb 11.2 oz (63.8 kg)  12/31/20 140 lb (63.5 kg)     Other studies reviewed: Additional studies/records reviewed today include: summarized above  ASSESSMENT AND PLAN:  1. Persistent >> permanent AFib     CHA2DS2Vasc is 6 on Eliquis, appropriately dosed     S/p AV node ablation though with AV conduction today  HR's are controlled No changes today   2. PPM     Intact function     Rate reduced from 85 > 70 today  3. HTN     Looks OK   4. Falls     One trip and fall a few weeks ago, not happening frequently    Disposition: remotes as usual, will have her see dr. Caryl Comes in 74mo or so, eval rates and perhaps need for re-do ablation if rates up again.   Current medicines are reviewed  at length with the patient today.  The patient did not have any concerns regarding medicines.  Haywood Lasso, PA-C 03/27/2021 6:30 PM     [place] [address] [phone] (office)  [phone] (fax)

## 2021-03-29 ENCOUNTER — Ambulatory Visit (INDEPENDENT_AMBULATORY_CARE_PROVIDER_SITE_OTHER): Payer: Medicare Other | Admitting: Physician Assistant

## 2021-03-29 ENCOUNTER — Encounter: Payer: Self-pay | Admitting: Physician Assistant

## 2021-03-29 ENCOUNTER — Other Ambulatory Visit: Payer: Self-pay

## 2021-03-29 VITALS — BP 130/80 | HR 99 | Ht 66.0 in | Wt 134.8 lb

## 2021-03-29 DIAGNOSIS — Z950 Presence of cardiac pacemaker: Secondary | ICD-10-CM

## 2021-03-29 DIAGNOSIS — I10 Essential (primary) hypertension: Secondary | ICD-10-CM | POA: Diagnosis not present

## 2021-03-29 DIAGNOSIS — I480 Paroxysmal atrial fibrillation: Secondary | ICD-10-CM | POA: Diagnosis not present

## 2021-03-29 LAB — CUP PACEART INCLINIC DEVICE CHECK
Battery Remaining Longevity: 134 mo
Battery Voltage: 2.98 V
Brady Statistic AP VP Percent: 0 %
Brady Statistic AP VS Percent: 0 %
Brady Statistic AS VP Percent: 83.21 %
Brady Statistic AS VS Percent: 16.84 %
Brady Statistic RA Percent Paced: 0 %
Brady Statistic RV Percent Paced: 79.57 %
Date Time Interrogation Session: 20220503163940
Implantable Lead Implant Date: 20061019
Implantable Lead Implant Date: 20061019
Implantable Lead Location: 753859
Implantable Lead Location: 753860
Implantable Lead Model: 5076
Implantable Pulse Generator Implant Date: 20180611
Lead Channel Impedance Value: 342 Ohm
Lead Channel Impedance Value: 418 Ohm
Lead Channel Impedance Value: 741 Ohm
Lead Channel Impedance Value: 798 Ohm
Lead Channel Pacing Threshold Amplitude: 0.625 V
Lead Channel Pacing Threshold Amplitude: 0.875 V
Lead Channel Pacing Threshold Pulse Width: 0.4 ms
Lead Channel Pacing Threshold Pulse Width: 0.4 ms
Lead Channel Sensing Intrinsic Amplitude: 2.125 mV
Lead Channel Sensing Intrinsic Amplitude: 2.625 mV
Lead Channel Sensing Intrinsic Amplitude: 5.375 mV
Lead Channel Sensing Intrinsic Amplitude: 5.5 mV
Lead Channel Setting Pacing Amplitude: 2.5 V
Lead Channel Setting Pacing Pulse Width: 0.4 ms
Lead Channel Setting Sensing Sensitivity: 0.9 mV

## 2021-03-29 NOTE — Patient Instructions (Signed)
Medication Instructions:  Your physician recommends that you continue on your current medications as directed. Please refer to the Current Medication list given to you today.  *If you need a refill on your cardiac medications before your next appointment, please call your pharmacy*   Lab Work: [place]   If you have labs (blood work) drawn today and your tests are completely normal, you will receive your results only by: [place] MyChart Message (if you have MyChart) OR . A paper copy in the mail If you have any lab test that is abnormal or we need to change your treatment, we will call you to review the results.   Testing/Procedures: NONE ORDERED  TODAY   Follow-Up: At [facility], you and your health needs are our priority.  As part of our continuing mission to provide you with exceptional heart care, we have created designated Provider Care Teams.  These Care Teams include your primary Cardiologist (physician) and Advanced Practice Providers (APPs -  Physician Assistants and Nurse Practitioners) who all work together to provide you with the care you need, when you need it.  We recommend signing up for the patient portal called "MyChart".  Sign up information is provided on this After Visit Summary.  MyChart is used to connect with patients for Virtual Visits (Telemedicine).  Patients are able to view lab/test results, encounter notes, upcoming appointments, etc.  Non-urgent messages can be sent to your provider as well.   To learn more about what you can do with MyChart, go to [email or web address].    Your next appointment:   3 month(s)  The format for your next appointment:   In Person  Provider:   Virl Axe, MD    Other Instructions

## 2021-04-01 DIAGNOSIS — S20461A Insect bite (nonvenomous) of right back wall of thorax, initial encounter: Secondary | ICD-10-CM | POA: Diagnosis not present

## 2021-05-05 ENCOUNTER — Ambulatory Visit (INDEPENDENT_AMBULATORY_CARE_PROVIDER_SITE_OTHER): Payer: Medicare Other

## 2021-05-05 DIAGNOSIS — I442 Atrioventricular block, complete: Secondary | ICD-10-CM | POA: Diagnosis not present

## 2021-05-05 LAB — CUP PACEART REMOTE DEVICE CHECK
Battery Remaining Longevity: 131 mo
Battery Voltage: 2.99 V
Brady Statistic AP VP Percent: 0 %
Brady Statistic AP VS Percent: 0 %
Brady Statistic AS VP Percent: 62.93 %
Brady Statistic AS VS Percent: 37.24 %
Brady Statistic RA Percent Paced: 0 %
Brady Statistic RV Percent Paced: 44.37 %
Date Time Interrogation Session: 20220609025731
Implantable Lead Implant Date: 20061019
Implantable Lead Implant Date: 20061019
Implantable Lead Location: 753859
Implantable Lead Location: 753860
Implantable Lead Model: 5076
Implantable Pulse Generator Implant Date: 20180611
Lead Channel Impedance Value: 304 Ohm
Lead Channel Impedance Value: 380 Ohm
Lead Channel Impedance Value: 627 Ohm
Lead Channel Impedance Value: 703 Ohm
Lead Channel Pacing Threshold Amplitude: 0.625 V
Lead Channel Pacing Threshold Amplitude: 0.875 V
Lead Channel Pacing Threshold Pulse Width: 0.4 ms
Lead Channel Pacing Threshold Pulse Width: 0.4 ms
Lead Channel Sensing Intrinsic Amplitude: 3 mV
Lead Channel Sensing Intrinsic Amplitude: 3 mV
Lead Channel Sensing Intrinsic Amplitude: 5.125 mV
Lead Channel Sensing Intrinsic Amplitude: 5.125 mV
Lead Channel Setting Pacing Amplitude: 2.5 V
Lead Channel Setting Pacing Pulse Width: 0.4 ms
Lead Channel Setting Sensing Sensitivity: 0.9 mV

## 2021-05-10 DIAGNOSIS — R634 Abnormal weight loss: Secondary | ICD-10-CM | POA: Diagnosis not present

## 2021-05-10 DIAGNOSIS — D696 Thrombocytopenia, unspecified: Secondary | ICD-10-CM | POA: Diagnosis not present

## 2021-05-10 DIAGNOSIS — J300 Vasomotor rhinitis: Secondary | ICD-10-CM | POA: Diagnosis not present

## 2021-05-10 DIAGNOSIS — J449 Chronic obstructive pulmonary disease, unspecified: Secondary | ICD-10-CM | POA: Diagnosis not present

## 2021-05-10 DIAGNOSIS — N184 Chronic kidney disease, stage 4 (severe): Secondary | ICD-10-CM | POA: Diagnosis not present

## 2021-05-10 DIAGNOSIS — E7800 Pure hypercholesterolemia, unspecified: Secondary | ICD-10-CM | POA: Diagnosis not present

## 2021-05-10 DIAGNOSIS — I700 Atherosclerosis of aorta: Secondary | ICD-10-CM | POA: Diagnosis not present

## 2021-05-10 DIAGNOSIS — R042 Hemoptysis: Secondary | ICD-10-CM | POA: Diagnosis not present

## 2021-05-10 DIAGNOSIS — Z79899 Other long term (current) drug therapy: Secondary | ICD-10-CM | POA: Diagnosis not present

## 2021-05-10 DIAGNOSIS — I480 Paroxysmal atrial fibrillation: Secondary | ICD-10-CM | POA: Diagnosis not present

## 2021-05-10 DIAGNOSIS — N1831 Chronic kidney disease, stage 3a: Secondary | ICD-10-CM | POA: Diagnosis not present

## 2021-05-12 ENCOUNTER — Other Ambulatory Visit: Payer: Self-pay | Admitting: Geriatric Medicine

## 2021-05-12 DIAGNOSIS — R042 Hemoptysis: Secondary | ICD-10-CM

## 2021-05-25 DIAGNOSIS — N1832 Chronic kidney disease, stage 3b: Secondary | ICD-10-CM | POA: Diagnosis not present

## 2021-05-27 NOTE — Progress Notes (Signed)
Remote pacemaker transmission.   

## 2021-05-31 ENCOUNTER — Other Ambulatory Visit: Payer: Self-pay | Admitting: Geriatric Medicine

## 2021-05-31 ENCOUNTER — Ambulatory Visit
Admission: RE | Admit: 2021-05-31 | Discharge: 2021-05-31 | Disposition: A | Discharge status: Home / Self Care | Payer: Medicare Other | Source: Ambulatory Visit | Attending: Geriatric Medicine | Admitting: Geriatric Medicine

## 2021-05-31 DIAGNOSIS — J439 Emphysema, unspecified: Secondary | ICD-10-CM | POA: Diagnosis not present

## 2021-05-31 DIAGNOSIS — R042 Hemoptysis: Secondary | ICD-10-CM | POA: Diagnosis not present

## 2021-05-31 DIAGNOSIS — I700 Atherosclerosis of aorta: Secondary | ICD-10-CM | POA: Diagnosis not present

## 2021-05-31 DIAGNOSIS — I2510 Atherosclerotic heart disease of native coronary artery without angina pectoris: Secondary | ICD-10-CM | POA: Diagnosis not present

## 2021-05-31 DIAGNOSIS — N281 Cyst of kidney, acquired: Secondary | ICD-10-CM

## 2021-05-31 MED ORDER — IOPAMIDOL (ISOVUE-300) INJECTION 61%
50.0000 mL | Freq: Once | INTRAVENOUS | Status: AC | PRN
  Administered 2021-05-31: 50 mL via INTRAVENOUS

## 2021-06-13 ENCOUNTER — Other Ambulatory Visit: Payer: Self-pay | Admitting: Geriatric Medicine

## 2021-06-13 DIAGNOSIS — N281 Cyst of kidney, acquired: Secondary | ICD-10-CM

## 2021-06-21 DIAGNOSIS — J449 Chronic obstructive pulmonary disease, unspecified: Secondary | ICD-10-CM | POA: Diagnosis not present

## 2021-06-21 DIAGNOSIS — N1831 Chronic kidney disease, stage 3a: Secondary | ICD-10-CM | POA: Diagnosis not present

## 2021-06-21 DIAGNOSIS — M10041 Idiopathic gout, right hand: Secondary | ICD-10-CM | POA: Diagnosis not present

## 2021-06-21 DIAGNOSIS — I480 Paroxysmal atrial fibrillation: Secondary | ICD-10-CM | POA: Diagnosis not present

## 2021-07-01 ENCOUNTER — Ambulatory Visit
Admission: RE | Admit: 2021-07-01 | Discharge: 2021-07-01 | Disposition: A | Discharge status: Home / Self Care | Payer: Medicare Other | Source: Ambulatory Visit | Attending: Geriatric Medicine | Admitting: Geriatric Medicine

## 2021-07-01 ENCOUNTER — Other Ambulatory Visit: Payer: Self-pay

## 2021-07-01 DIAGNOSIS — N1330 Unspecified hydronephrosis: Secondary | ICD-10-CM | POA: Diagnosis not present

## 2021-07-01 DIAGNOSIS — K429 Umbilical hernia without obstruction or gangrene: Secondary | ICD-10-CM | POA: Diagnosis not present

## 2021-07-01 DIAGNOSIS — N281 Cyst of kidney, acquired: Secondary | ICD-10-CM | POA: Diagnosis not present

## 2021-07-01 DIAGNOSIS — D259 Leiomyoma of uterus, unspecified: Secondary | ICD-10-CM | POA: Diagnosis not present

## 2021-07-01 MED ORDER — IOPAMIDOL (ISOVUE-300) INJECTION 61%
100.0000 mL | Freq: Once | INTRAVENOUS | Status: AC | PRN
  Administered 2021-07-01: 100 mL via INTRAVENOUS

## 2021-07-04 ENCOUNTER — Other Ambulatory Visit (HOSPITAL_COMMUNITY): Payer: Self-pay | Admitting: Geriatric Medicine

## 2021-07-04 ENCOUNTER — Other Ambulatory Visit: Payer: Self-pay | Admitting: Geriatric Medicine

## 2021-07-04 DIAGNOSIS — N289 Disorder of kidney and ureter, unspecified: Secondary | ICD-10-CM

## 2021-07-05 ENCOUNTER — Other Ambulatory Visit: Payer: Self-pay

## 2021-07-05 ENCOUNTER — Encounter: Payer: Self-pay | Admitting: Internal Medicine

## 2021-07-05 ENCOUNTER — Ambulatory Visit (INDEPENDENT_AMBULATORY_CARE_PROVIDER_SITE_OTHER): Payer: Medicare Other | Admitting: Internal Medicine

## 2021-07-05 VITALS — BP 120/78 | HR 70 | Ht 66.0 in | Wt 125.2 lb

## 2021-07-05 DIAGNOSIS — I4819 Other persistent atrial fibrillation: Secondary | ICD-10-CM

## 2021-07-05 DIAGNOSIS — Z950 Presence of cardiac pacemaker: Secondary | ICD-10-CM

## 2021-07-05 NOTE — Progress Notes (Signed)
Patient ID: [name] [name], female   DOB: 10-31-1932, 87 y.o.   MRN: [id number]  HPI  [name] [name] is a 87 y.o. female seen in followup for atrial fibrillation for which she took Rythmol and bradycardia for which she is status post pacemaker implantation 2006. Underwent generator replacement 5/18  She saw Dr. Greggory Brandy 2019; amiodarone was initiated.  Stopped 12/19 because of pulmonary concerns. Issues of rate control were challenging with up titration of diltiazem and concomitant beta-blockers seeming to do reasonably well.  AV junction ablation previously discussed finally done 3/22     Today, the patient denies chest pain, nocturnal dyspnea, orthopnea or peripheral edema.  There have been no syncope.   Complains of some shortness of breath but overall feels significantly improved. She has mild palpitation at night when resting   Had an episode of significant lightheadedness.  Saw her primary care physician decrease diltiazem from 120--60; metoprolol had previously been discontinued  .  No Bleeding on Eliquis    She notes she got a gout in her L hand after her ablation. She notes having a CT scan on her bladder and diagnosed with a cyst . She will be going for Nuclear test on 07/07/21  DATE TEST EF%   8/15 Echo   55-65 %   1/19 Echo   55-65 % Mild LAE  8/21 Echo  55-60%    Prior antiarrhythmic drugs include propafenone and amiodarone; dofetilide precluded by QT prolongation   Date Cr K Hgb  6/18 0.89  13.6  1/19  1.01 3.5 13.3  9/20 1.16 4.3 14.7  12/21 0.94 4.6 15.0  03/22 0.89 4.2 78.2    Thromboembolic risk factors ( age  -2, HTN-1, CVA -2, Gender-1) for a CHADSVASc Score of >=6  Past Medical History:  Diagnosis Date   Arthritis    CVA    Lacunar and cerebellar infarct   Depression    DVT (deep venous thrombosis) ([place])    Details not available in the chart   Edema    Mitral valve regurgitation    a. echo 2009 mild to moderate MR   Paroxysmal atrial  fibrillation ([place])    Pneumonia    as a child   Presence of permanent cardiac pacemaker    Sinoatrial node dysfunction ([place])    Unspecified essential hypertension     Past Surgical History:  Procedure Laterality Date   AV NODE ABLATION N/A 02/18/2021   Procedure: AV NODE ABLATION;  Surgeon: Deboraha Sprang, MD;  Location: [place] [facility];  Service: Cardiovascular;  Laterality: N/A;   CLOSED REDUCTION ULNAR SHAFT Right 11/15/2018   Procedure: CLOSED REDUCTION RIGHT DISTAL ULNAR FRACTURE;  Surgeon: Charlotte Crumb, MD;  Location: [place];  Service: Orthopedics;  Laterality: Right;   OPEN REDUCTION INTERNAL FIXATION (ORIF) DISTAL RADIAL FRACTURE Right 11/15/2018   Procedure: OPEN REDUCTION INTERNAL FIXATION (ORIF) RIGHT DISTAL RADIAL FRACTURE;  Surgeon: Charlotte Crumb, MD;  Location: [place];  Service: Orthopedics;  Laterality: Right;   PACEMAKER INSERTION  09-14-2005   MDT EnRhythm dual chamber pacemaker implanted by Dr Lovena Le for SND   PPM GENERATOR CHANGEOUT N/A 05/07/2017   Procedure: [facility] Generator Changeout;  Surgeon: Deboraha Sprang, MD;  Location: [place] [facility];  Service: Cardiovascular;  Laterality: N/A;   TONSILLECTOMY      Current Outpatient Medications  Medication Sig Dispense Refill   Alpha-D-Galactosidase (BEANO) TABS Take 1 tablet by mouth 3 (three) times daily before meals.  Apoaequorin 10 MG CAPS Take 10 mg by mouth daily.      Cholecalciferol (VITAMIN D3) 2000 UNITS TABS Take 2,000 Units by mouth daily.     desonide (DESOWEN) 0.05 % ointment Apply 1 application topically 2 (two) times daily as needed (for irritation).      diltiazem (CARDIZEM CD) 120 MG 24 hr capsule Take 1 capsule (120 mg total) by mouth daily. 90 capsule 2   diltiazem (CARDIZEM) 30 MG tablet TAKE 1 TABLET ONCE DAILY AS NEEDED FOR PALPATATION. 90 tablet 0   ELIQUIS 5 MG TABS tablet TAKE 1 TABLET BY MOUTH TWICE DAILY. 60 tablet 5   erythromycin ophthalmic ointment Place 1 application into the  left eye at bedtime.     Lactase (LACTAID PO) Take 1 tablet by mouth daily as needed (Lactose Intolerance).     loratadine (CLARITIN) 10 MG tablet Take 1 tablet (10 mg total) by mouth daily. For allergies, congestion 30 tablet 3   LORazepam (ATIVAN) 0.5 MG tablet Take 0.5 mg by mouth daily as needed for anxiety or sleep.     metoprolol succinate (TOPROL-XL) 50 MG 24 hr tablet Take 1 tablet (50 mg total) by mouth every evening. Take with or immediately following a meal. 90 tablet 0   Polyvinyl Alcohol-Povidone (REFRESH OP) Place 1 drop into both eyes 2 (two) times daily as needed (for dry eyes).      sodium chloride (OCEAN) 0.65 % SOLN nasal spray Place 1 spray into both nostrils as needed for congestion.     vitamin B-12 (CYANOCOBALAMIN) 1000 MCG tablet Take 1,000 mcg by mouth daily.     No current facility-administered medications for this visit.    Allergies  Allergen Reactions   Gluten Meal Diarrhea   Itraconazole Diarrhea and Itching   Lactose Intolerance (Gi) Diarrhea and Nausea Only   Statins Other (See Comments)    Myalgias    Review of Systems negative except from HPI and PMH  Physical Exam: BP 120/78   Pulse 70   Ht 5\' 6"  (1.676 m)   Wt 125 lb 3.2 oz (56.8 kg)   SpO2 100%   BMI 20.21 kg/m  Well developed and well nourished in no acute distress HENT normal Neck supple with JVP-flat Lungs Clear Device pocket well healed; without hematoma or erythema.  There is no tethering  Regular rate and rhythm, no / murmur Abd-soft with active BS No Clubbing cyanosis No edema Skin-warm and dry A & Oriented  Grossly normal sensory and motor function  ECG: Atrial fibrillation with underlying ventricular pacing at 70    Assessment and  Plan  Atrial fibrillation-permanent  Hypertension   AV junction ablation with partial return of conduction  Pacemaker-Medtronic   Atrial fibrillation is permanent.  Rate control has been pharmacologically insufficient.  We will return AV  junction ablation 3/22 with transient complete heart block but now with some recovery.  However, there has been a notwithstanding significant decrease in the mean heart rates from over 100 to the mid 70s.  We will continue her diltiazem at 60 mg daily; blood pressures are quite adequate today and hypertension is clearly not an issue.    At this juncture will not entertain repeat ablation.  It may come to this.  Lengthy discussion reviewing this as well as her poor understanding as to the upcoming renal testing     I,Stephanie Williams,acting as a scribe for Virl Axe, MD.,have documented all relevant documentation on the behalf of Virl Axe, MD,as directed  by  Virl Axe, MD while in the presence of Virl Axe, MD.  ,I, Virl Axe, MD, have reviewed all documentation for this visit. The documentation on 07/05/21 for the exam, diagnosis, procedures, and orders are all accurate and complete.

## 2021-07-05 NOTE — Patient Instructions (Signed)
Medication Instructions:  Your physician recommends that you continue on your current medications as directed. Please refer to the Current Medication list given to you today.  *If you need a refill on your cardiac medications before your next appointment, please call your pharmacy*   Lab Work: None ordered.  If you have labs (blood work) drawn today and your tests are completely normal, you will receive your results only by: . MyChart Message (if you have MyChart) OR . A paper copy in the mail If you have any lab test that is abnormal or we need to change your treatment, we will call you to review the results.   Testing/Procedures: None ordered.    Follow-Up: At [facility], you and your health needs are our priority.  As part of our continuing mission to provide you with exceptional heart care, we have created designated Provider Care Teams.  These Care Teams include your primary Cardiologist (physician) and Advanced Practice Providers (APPs -  Physician Assistants and Nurse Practitioners) who all work together to provide you with the care you need, when you need it.  We recommend signing up for the patient portal called "MyChart".  Sign up information is provided on this After Visit Summary.  MyChart is used to connect with patients for Virtual Visits (Telemedicine).  Patients are able to view lab/test results, encounter notes, upcoming appointments, etc.  Non-urgent messages can be sent to your provider as well.   To learn more about what you can do with MyChart, go to [email or web address].    Your next appointment:   6 month(s)  The format for your next appointment:   In Person  Provider:   Dr Klein    

## 2021-07-08 ENCOUNTER — Encounter (HOSPITAL_COMMUNITY)
Admission: RE | Admit: 2021-07-08 | Discharge: 2021-07-08 | Disposition: A | Discharge status: Home / Self Care | Payer: Medicare Other | Source: Ambulatory Visit | Attending: Geriatric Medicine | Admitting: Geriatric Medicine

## 2021-07-08 ENCOUNTER — Other Ambulatory Visit: Payer: Self-pay

## 2021-07-08 DIAGNOSIS — N289 Disorder of kidney and ureter, unspecified: Secondary | ICD-10-CM | POA: Diagnosis not present

## 2021-07-08 DIAGNOSIS — Q630 Accessory kidney: Secondary | ICD-10-CM | POA: Diagnosis not present

## 2021-07-08 DIAGNOSIS — N281 Cyst of kidney, acquired: Secondary | ICD-10-CM | POA: Diagnosis not present

## 2021-07-08 DIAGNOSIS — Q620 Congenital hydronephrosis: Secondary | ICD-10-CM | POA: Diagnosis not present

## 2021-07-08 MED ORDER — FUROSEMIDE 10 MG/ML IJ SOLN
28.4000 mg | Freq: Once | INTRAMUSCULAR | Status: AC
  Administered 2021-07-08: 28 mg via INTRAVENOUS

## 2021-07-08 MED ORDER — FUROSEMIDE 10 MG/ML IJ SOLN
INTRAMUSCULAR | Status: AC
  Filled 2021-07-08: qty 4

## 2021-07-08 MED ORDER — TECHNETIUM TC 99M MERTIATIDE
5.0000 | Freq: Once | INTRAVENOUS | Status: AC
  Administered 2021-07-08: 5 via INTRAVENOUS

## 2021-08-04 ENCOUNTER — Ambulatory Visit (INDEPENDENT_AMBULATORY_CARE_PROVIDER_SITE_OTHER): Payer: Medicare Other

## 2021-08-04 DIAGNOSIS — I495 Sick sinus syndrome: Secondary | ICD-10-CM

## 2021-08-04 LAB — CUP PACEART REMOTE DEVICE CHECK
Battery Remaining Longevity: 124 mo
Battery Voltage: 2.99 V
Brady Statistic AP VP Percent: 0 %
Brady Statistic AP VS Percent: 0 %
Brady Statistic AS VP Percent: 25.33 %
Brady Statistic AS VS Percent: 74.55 %
Brady Statistic RA Percent Paced: 0 %
Brady Statistic RV Percent Paced: 46.14 %
Date Time Interrogation Session: 20220907232913
Implantable Lead Implant Date: 20061019
Implantable Lead Implant Date: 20061019
Implantable Lead Location: 753859
Implantable Lead Location: 753860
Implantable Lead Model: 5076
Implantable Pulse Generator Implant Date: 20180611
Lead Channel Impedance Value: 342 Ohm
Lead Channel Impedance Value: 399 Ohm
Lead Channel Impedance Value: 551 Ohm
Lead Channel Impedance Value: 608 Ohm
Lead Channel Pacing Threshold Amplitude: 0.625 V
Lead Channel Pacing Threshold Amplitude: 0.875 V
Lead Channel Pacing Threshold Pulse Width: 0.4 ms
Lead Channel Pacing Threshold Pulse Width: 0.4 ms
Lead Channel Sensing Intrinsic Amplitude: 1.875 mV
Lead Channel Sensing Intrinsic Amplitude: 1.875 mV
Lead Channel Sensing Intrinsic Amplitude: 4.25 mV
Lead Channel Sensing Intrinsic Amplitude: 4.25 mV
Lead Channel Setting Pacing Amplitude: 2.5 V
Lead Channel Setting Pacing Pulse Width: 0.4 ms
Lead Channel Setting Sensing Sensitivity: 0.9 mV

## 2021-08-12 NOTE — Progress Notes (Signed)
Remote pacemaker transmission.   

## 2021-09-16 DIAGNOSIS — N130 Hydronephrosis with ureteropelvic junction obstruction: Secondary | ICD-10-CM | POA: Diagnosis not present

## 2021-09-16 DIAGNOSIS — Q625 Duplication of ureter: Secondary | ICD-10-CM | POA: Diagnosis not present

## 2021-10-31 DIAGNOSIS — H02105 Unspecified ectropion of left lower eyelid: Secondary | ICD-10-CM | POA: Diagnosis not present

## 2021-10-31 DIAGNOSIS — H02102 Unspecified ectropion of right lower eyelid: Secondary | ICD-10-CM | POA: Diagnosis not present

## 2021-11-03 ENCOUNTER — Ambulatory Visit (INDEPENDENT_AMBULATORY_CARE_PROVIDER_SITE_OTHER): Payer: Medicare Other

## 2021-11-03 DIAGNOSIS — I495 Sick sinus syndrome: Secondary | ICD-10-CM | POA: Diagnosis not present

## 2021-11-03 LAB — CUP PACEART REMOTE DEVICE CHECK
Battery Remaining Longevity: 112 mo
Battery Voltage: 2.98 V
Brady Statistic AP VP Percent: 0 %
Brady Statistic AP VS Percent: 0 %
Brady Statistic AS VP Percent: 42.63 %
Brady Statistic AS VS Percent: 57.39 %
Brady Statistic RA Percent Paced: 0 %
Brady Statistic RV Percent Paced: 45.99 %
Date Time Interrogation Session: 20221207235847
Implantable Lead Implant Date: 20061019
Implantable Lead Implant Date: 20061019
Implantable Lead Location: 753859
Implantable Lead Location: 753860
Implantable Lead Model: 5076
Implantable Pulse Generator Implant Date: 20180611
Lead Channel Impedance Value: 304 Ohm
Lead Channel Impedance Value: 380 Ohm
Lead Channel Impedance Value: 456 Ohm
Lead Channel Impedance Value: 532 Ohm
Lead Channel Pacing Threshold Amplitude: 0.625 V
Lead Channel Pacing Threshold Amplitude: 1 V
Lead Channel Pacing Threshold Pulse Width: 0.4 ms
Lead Channel Pacing Threshold Pulse Width: 0.4 ms
Lead Channel Sensing Intrinsic Amplitude: 1.625 mV
Lead Channel Sensing Intrinsic Amplitude: 1.625 mV
Lead Channel Sensing Intrinsic Amplitude: 5.125 mV
Lead Channel Sensing Intrinsic Amplitude: 5.125 mV
Lead Channel Setting Pacing Amplitude: 2.5 V
Lead Channel Setting Pacing Pulse Width: 0.4 ms
Lead Channel Setting Sensing Sensitivity: 0.9 mV

## 2021-11-11 NOTE — Progress Notes (Signed)
Remote pacemaker transmission.   

## 2021-11-29 ENCOUNTER — Telehealth: Payer: Self-pay

## 2021-11-29 NOTE — Telephone Encounter (Signed)
Successful telephone encounter to patient to assist with manual transmission per request of Dr. Caryl Comes. Patient remains in AF. She is asymptomatic. Reviewed medications. Patient states she IS NOT taking her diltizam as she feels Dr. Caryl Comes told her to discontinue. She is also not taking her Metropolol for same reasoning. Discussed importance of eliquis 5mg  BID as she admits to not taking her nighttime dose. Will route to Dr. Caryl Comes for advisement and medication reconciliation.

## 2021-11-29 NOTE — Telephone Encounter (Signed)
-----   Message from Deboraha Sprang, MD sent at 11/28/2021  4:46 PM EST ----- Remote reviewed. This remote is abnormal for persistent afib  Can you ladies get her to transmit to see if still in afib

## 2021-12-01 NOTE — Telephone Encounter (Signed)
Per Dr Caryl Comes pt should restart Diltiazem at 60mg  - 1 tablet by mouth daily.  Do not restart Metoprolol and take Eliquis as prescribed.   Spoke with pt and advised of Dr Olin Pia recommendations as above and that she has appointment scheduled with Dr Caryl Comes on 01/09/2022.  Pt verbalizes understanding and agrees with current plan.

## 2021-12-01 NOTE — Telephone Encounter (Signed)
M  good am Could you let MS know that her HR are faster than I would like and would wonder if she is willing to take her dilt 60 bid and if she tolerates it we could rewrite the Rx as 120 daily Thanks SK

## 2021-12-01 NOTE — Telephone Encounter (Signed)
Per Dr Caryl Comes pt should restart Diltiazem at 60mg  - 1 tablet by mouth daily.  Do not restart Metoprolol and take Eliquis as prescribed.  Spoke with pt and advised of Dr Olin Pia recommendations as above and that she has appointment scheduled with Dr Caryl Comes on 01/09/2022.  Pt verbalizes understanding and agrees with current plan.

## 2021-12-01 NOTE — Telephone Encounter (Signed)
Spoke with pt who reports she has not been taking Diltiazem 60mg  or Metoprolol 50mg .  She also has not been consistently taking her Eliquis.  RN stressed importance of taking Eliquis and not missing any doses due to risk of stroke.  Advised pt to restart Diltiazem 60mg  and will confirm with Dr Caryl Comes dosage due to pt not taking medication.  Will follow up with pt after discussing with Dr Caryl Comes. Pt verbalizes understanding and agrees with current plan

## 2021-12-02 NOTE — Telephone Encounter (Signed)
Per Dr Caryl Comes today, pt should restart Diltiazem 60mg  - 1 tablet by mouth twice daily.  Spoke with pt and advised of Dr Olin Pia recommendation.  Pt verbalizes understanding and agrees with current plan.

## 2021-12-08 NOTE — Progress Notes (Signed)
Electrophysiology Office Note Date: 12/08/2021  ID:  [name] [name], DOB [birth year], MRN [id number]  PCP: Lajean Manes, MD Primary Cardiologist: Johnsie Cancel Electrophysiologist: Dr. Caryl Comes   CC: PPM/PAF   [name] [name] is a 88 y.o. female with a history of PAFib, tachy-brady w/PPM, HTN, CVA, and DVT She has mostly been followed by Dr Caryl Comes and EP  She was hospitalized for AV nodal ablation due to symptomatic episodes of tachycardia and this was done 02/18/21  She also  Had RVOT PVCls and PPM programed higher rate to suppress.   She has been intolerant to beta blockers and some CCB She had ataxia and neurotoxicity with Rhythmol. Amiodarone stopped after she developed decreased DLCO 12/01/21 cardizem dose increased to bid per Dr Caryl Comes for high rates  Last PACEART reviewed shows 67% afib burden 11/02/21   Got another multipoo Abigale who keeps her busy  Plays piano at [employer]  Echo 2019 with EF 55-60% mild AR and mild LAE  No cardiac complaints Needs to see EP   Device History: Medtronic Dual Chamber PPM implanted 08/2005 -> gen change 05/07/17 for tachy-brady  Past Medical History:  Diagnosis Date   Arthritis    CVA    Lacunar and cerebellar infarct   Depression    DVT (deep venous thrombosis) ([place])    Details not available in the chart   Edema    Mitral valve regurgitation    a. echo 2009 mild to moderate MR   Paroxysmal atrial fibrillation ([facility])    Pneumonia    as a child   Presence of permanent cardiac pacemaker    Sinoatrial node dysfunction ([place])    Unspecified essential hypertension    Past Surgical History:  Procedure Laterality Date   AV NODE ABLATION N/A 02/18/2021   Procedure: AV NODE ABLATION;  Surgeon: Deboraha Sprang, MD;  Location: [place] [facility];  Service: Cardiovascular;  Laterality: N/A;   CLOSED REDUCTION ULNAR SHAFT Right 11/15/2018   Procedure: CLOSED REDUCTION RIGHT DISTAL ULNAR FRACTURE;  Surgeon: Charlotte Crumb, MD;  Location: [place];  Service: Orthopedics;  Laterality: Right;   OPEN REDUCTION INTERNAL FIXATION (ORIF) DISTAL RADIAL FRACTURE Right 11/15/2018   Procedure: OPEN REDUCTION INTERNAL FIXATION (ORIF) RIGHT DISTAL RADIAL FRACTURE;  Surgeon: Charlotte Crumb, MD;  Location: [place];  Service: Orthopedics;  Laterality: Right;   PACEMAKER INSERTION  09-14-2005   MDT EnRhythm dual chamber pacemaker implanted by Dr Lovena Le for SND   PPM GENERATOR CHANGEOUT N/A 05/07/2017   Procedure: [facility] Generator Changeout;  Surgeon: Deboraha Sprang, MD;  Location: [place] [facility];  Service: Cardiovascular;  Laterality: N/A;   TONSILLECTOMY      Current Outpatient Medications  Medication Sig Dispense Refill   Alpha-D-Galactosidase (BEANO) TABS Take 1 tablet by mouth 3 (three) times daily before meals.     Apoaequorin 10 MG CAPS Take 10 mg by mouth daily.      augmented betamethasone dipropionate (DIPROLENE-AF) 0.05 % cream Apply topically as needed.     Cholecalciferol (VITAMIN D3) 2000 UNITS TABS Take 2,000 Units by mouth daily.     colchicine 0.6 MG tablet Take 0.6 mg by mouth 2 (two) times daily.     desonide (DESOWEN) 0.05 % ointment Apply 1 application topically 2 (two) times daily as needed (for irritation).      diltiazem (CARDIZEM) 30 MG tablet TAKE 1 TABLET ONCE DAILY AS NEEDED FOR PALPATATION. 90 tablet 0   diltiazem (CARDIZEM) 60 MG tablet Take  60 mg by mouth 2 (two) times daily.     ELIQUIS 5 MG TABS tablet TAKE 1 TABLET BY MOUTH TWICE DAILY. 60 tablet 5   erythromycin ophthalmic ointment Place 1 application into the left eye at bedtime.     ipratropium (ATROVENT) 0.03 % nasal spray Place 1 spray into both nostrils as needed.     Lactase (LACTAID PO) Take 1 tablet by mouth daily as needed (Lactose Intolerance).     loratadine (CLARITIN) 10 MG tablet Take 1 tablet (10 mg total) by mouth daily. For allergies, congestion 30 tablet 3   LORazepam (ATIVAN) 0.5 MG tablet Take 0.5 mg by mouth daily  as needed for anxiety or sleep.     metoprolol succinate (TOPROL-XL) 50 MG 24 hr tablet Take 1 tablet (50 mg total) by mouth every evening. Take with or immediately following a meal. 90 tablet 0   Polyvinyl Alcohol-Povidone (REFRESH OP) Place 1 drop into both eyes 2 (two) times daily as needed (for dry eyes).      sodium chloride (OCEAN) 0.65 % SOLN nasal spray Place 1 spray into both nostrils as needed for congestion.     vitamin B-12 (CYANOCOBALAMIN) 1000 MCG tablet Take 1,000 mcg by mouth daily.     No current facility-administered medications for this visit.    Allergies:   Gluten meal, Itraconazole, Lactose intolerance (gi), and Statins   Social History: Social History   Socioeconomic History   Marital status: Widowed    Spouse name: Not on file   Number of children: Not on file   Years of education: Not on file   Highest education level: Not on file  Occupational History   Not on file  Tobacco Use   Smoking status: Former    Types: Cigarettes    Quit date: 08/13/1984    Years since quitting: 37.3   Smokeless tobacco: Never  Vaping Use   Vaping Use: Never used  Substance and Sexual Activity   Alcohol use: Yes    Alcohol/week: 14.0 standard drinks    Types: 14 Glasses of wine per week   Drug use: No   Sexual activity: Not Currently  Other Topics Concern   Not on file  Social History Narrative   Not on file   Social Determinants of Health   Financial Resource Strain: Not on file  Food Insecurity: Not on file  Transportation Needs: Not on file  Physical Activity: Not on file  Stress: Not on file  Social Connections: Not on file  Intimate Partner Violence: Not on file    Family History: Family History  Problem Relation Age of Onset   Cancer Mother    Mitral valve prolapse Mother    Diabetes Father    Hypertension Father    Coronary artery disease Sister    Stroke Brother    Coronary artery disease Sister      Review of Systems: All other systems  reviewed and are otherwise negative except as noted above.  Physical Exam: There were no vitals filed for this visit.    Affect appropriate Healthy:  appears stated age 64: normal Neck supple with no adenopathy JVP normal no bruits no thyromegaly Lungs clear with no wheezing and good diaphragmatic motion Heart:  S1/S2 no murmur, no rub, gallop or click PMI normal PPM under left clavicle  Abdomen: benighn, BS positve, no tenderness, no AAA no bruit.  No HSM or HJR Distal pulses intact with no bruits No edema Neuro non-focal Skin warm and  dry No muscular weakness   PPM Interrogation- reviewed in detail today,  See PACEART report  EKG:  EKG is not ordered today. Device reviewed  Recent Labs: 02/16/2021: BUN 21; Creatinine, Ser 0.89; Hemoglobin 14.6; Platelets 127; Potassium 4.2; Sodium 143   Wt Readings from Last 3 Encounters:  07/05/21 125 lb 3.2 oz (56.8 kg)  03/29/21 134 lb 12.8 oz (61.1 kg)  03/03/21 134 lb (60.8 kg)     Other studies Reviewed: Additional studies/ records that were reviewed today include: Echo 11/2017 shows LVEF 55-60%, Previous EP office notes, Previous remote checks, Most recent labwork.   Assessment and Plan:  1. Tachybradycardia syndrome s/p Medtronic PPM - normal function PACE ART reviewed stable   2. PAFib- CHADVASC 7 on eliquis with no bleeding Post AV node ablation after many drug intolerance issues Continue cardizem and Tooprol Afib burden 67% on PACE ART 11/02/21   3. HTN- Well controlled.  Continue current medications and low sodium Dash type diet.     F/U with Dr Caryl Comes Does not need general cardiology f/u   Signed, Jenkins Rouge, MD  12/08/2021 5:34 PM  [place] [address] [place] [address] [phone] (office) [phone] (fax)

## 2021-12-19 ENCOUNTER — Encounter: Payer: Self-pay | Admitting: Cardiovascular Disease

## 2021-12-19 ENCOUNTER — Other Ambulatory Visit: Payer: Self-pay

## 2021-12-19 ENCOUNTER — Ambulatory Visit (INDEPENDENT_AMBULATORY_CARE_PROVIDER_SITE_OTHER): Payer: Medicare Other | Admitting: Cardiovascular Disease

## 2021-12-19 VITALS — BP 100/70 | HR 68 | Ht 66.0 in | Wt 120.0 lb

## 2021-12-19 DIAGNOSIS — I4891 Unspecified atrial fibrillation: Secondary | ICD-10-CM | POA: Diagnosis not present

## 2021-12-19 DIAGNOSIS — Z950 Presence of cardiac pacemaker: Secondary | ICD-10-CM | POA: Diagnosis not present

## 2021-12-19 DIAGNOSIS — I442 Atrioventricular block, complete: Secondary | ICD-10-CM | POA: Diagnosis not present

## 2021-12-19 DIAGNOSIS — I495 Sick sinus syndrome: Secondary | ICD-10-CM

## 2021-12-19 NOTE — Patient Instructions (Signed)
Medication Instructions:  Your physician recommends that you continue on your current medications as directed. Please refer to the Current Medication list given to you today.  *If you need a refill on your cardiac medications before your next appointment, please call your pharmacy*  Lab Work: If you have labs (blood work) drawn today and your tests are completely normal, you will receive your results only by: [place] (if you have MyChart) OR A paper copy in the mail If you have any lab test that is abnormal or we need to change your treatment, we will call you to review the results.  Testing/Procedures: None ordered today.  Follow-Up: At [facility], you and your health needs are our priority.  As part of our continuing mission to provide you with exceptional heart care, we have created designated Provider Care Teams.  These Care Teams include your primary Cardiologist (physician) and Advanced Practice Providers (APPs -  Physician Assistants and Nurse Practitioners) who all work together to provide you with the care you need, when you need it.  We recommend signing up for the patient portal called "MyChart".  Sign up information is provided on this After Visit Summary.  MyChart is used to connect with patients for Virtual Visits (Telemedicine).  Patients are able to view lab/test results, encounter notes, upcoming appointments, etc.  Non-urgent messages can be sent to your provider as well.   To learn more about what you can do with MyChart, go to [email or web address].    Your next appointment:   Is on January 09, 2022 at 2:15 pm  The format for your next appointment:   In Person  Provider:   You may see Virl Axe, MD or one of the following Advanced Practice Providers on your designated Care Team:   Tommye Standard, [place] "[facility]" [place], [place]

## 2021-12-26 DIAGNOSIS — Z961 Presence of intraocular lens: Secondary | ICD-10-CM | POA: Diagnosis not present

## 2021-12-26 DIAGNOSIS — H02105 Unspecified ectropion of left lower eyelid: Secondary | ICD-10-CM | POA: Diagnosis not present

## 2021-12-26 DIAGNOSIS — H02102 Unspecified ectropion of right lower eyelid: Secondary | ICD-10-CM | POA: Diagnosis not present

## 2022-01-05 DIAGNOSIS — I4821 Permanent atrial fibrillation: Secondary | ICD-10-CM | POA: Insufficient documentation

## 2022-01-09 ENCOUNTER — Encounter: Payer: Self-pay | Admitting: Internal Medicine

## 2022-01-09 ENCOUNTER — Ambulatory Visit (INDEPENDENT_AMBULATORY_CARE_PROVIDER_SITE_OTHER): Payer: Medicare Other | Admitting: Internal Medicine

## 2022-01-09 ENCOUNTER — Other Ambulatory Visit: Payer: Self-pay

## 2022-01-09 VITALS — BP 128/70 | HR 84 | Ht 65.0 in | Wt 117.8 lb

## 2022-01-09 DIAGNOSIS — I4821 Permanent atrial fibrillation: Secondary | ICD-10-CM

## 2022-01-09 DIAGNOSIS — Z950 Presence of cardiac pacemaker: Secondary | ICD-10-CM

## 2022-01-09 NOTE — Progress Notes (Signed)
Patient ID: [name] [name], female   DOB: [birth year], 88 y.o.   MRN: [id number]  HPI  [name] [name] is a 88 y.o. female seen in followup for atrial fibrillation for which she took Rythmol and bradycardia for which she is status post pacemaker implantation 2006. Underwent generator replacement 5/18  She saw Dr. Greggory Brandy 2019; amiodarone was initiated.  Stopped 12/19 because of pulmonary concerns. Issues of rate control were challenging with up titration of diltiazem and concomitant beta-blockers seeming to do reasonably well.  AV junction ablation previously discussed finally done 3/22    The patient denies chest pain, shortness of breath, nocturnal dyspnea, orthopnea or peripheral edema.  There have been no palpitations, lightheadedness or syncope.    No bleeding on Apixoban       DATE TEST EF%   8/15 Echo   55-65 %   1/19 Echo   55-65 % Mild LAE  8/21 Echo  55-60%    Prior antiarrhythmic drugs include propafenone and amiodarone; dofetilide precluded by QT prolongation   Date Cr K Hgb  6/18 0.89  13.6  1/19  1.01 3.5 13.3  9/20 1.16 4.3 14.7  12/21 0.94 4.6 15.0  03/22 0.89 4.2 14.6         Thromboembolic risk factors ( age  -2, HTN-1, CVA -2, Gender-1) for a CHADSVASc Score of >=6  Past Medical History:  Diagnosis Date   Arthritis    CVA    Lacunar and cerebellar infarct   Depression    DVT (deep venous thrombosis) ([place])    Details not available in the chart   Edema    Mitral valve regurgitation    a. echo 2009 mild to moderate MR   Paroxysmal atrial fibrillation ([place])    Pneumonia    as a child   Presence of permanent cardiac pacemaker    Sinoatrial node dysfunction ([place])    Unspecified essential hypertension     Past Surgical History:  Procedure Laterality Date   AV NODE ABLATION N/A 02/18/2021   Procedure: AV NODE ABLATION;  Surgeon: Deboraha Sprang, MD;  Location: [facility];  Service: Cardiovascular;  Laterality: N/A;   CLOSED REDUCTION ULNAR  SHAFT Right 11/15/2018   Procedure: CLOSED REDUCTION RIGHT DISTAL ULNAR FRACTURE;  Surgeon: Charlotte Crumb, MD;  Location: [facility];  Service: Orthopedics;  Laterality: Right;   OPEN REDUCTION INTERNAL FIXATION (ORIF) DISTAL RADIAL FRACTURE Right 11/15/2018   Procedure: OPEN REDUCTION INTERNAL FIXATION (ORIF) RIGHT DISTAL RADIAL FRACTURE;  Surgeon: Charlotte Crumb, MD;  Location: [facility];  Service: Orthopedics;  Laterality: Right;   PACEMAKER INSERTION  09-14-2005   MDT EnRhythm dual chamber pacemaker implanted by Dr Lovena Le for SND   PPM GENERATOR CHANGEOUT N/A 05/07/2017   Procedure: [facility] Generator Changeout;  Surgeon: Deboraha Sprang, MD;  Location: [facility];  Service: Cardiovascular;  Laterality: N/A;   TONSILLECTOMY      Current Outpatient Medications  Medication Sig Dispense Refill   Alpha-D-Galactosidase (BEANO) TABS Take 1 tablet by mouth 3 (three) times daily before meals.     Apoaequorin 10 MG CAPS Take 10 mg by mouth daily.      augmented betamethasone dipropionate (DIPROLENE-AF) 0.05 % cream Apply topically as needed.     Cholecalciferol (VITAMIN D3) 2000 UNITS TABS Take 2,000 Units by mouth daily.     colchicine 0.6 MG tablet Take 0.6 mg by mouth 2 (two) times daily.     desonide (DESOWEN) 0.05 % ointment Apply 1  application topically 2 (two) times daily as needed (for irritation).      diltiazem (CARDIZEM) 30 MG tablet TAKE 1 TABLET ONCE DAILY AS NEEDED FOR PALPATATION. 90 tablet 0   diltiazem (CARDIZEM) 60 MG tablet Take 60 mg by mouth 2 (two) times daily.     ELIQUIS 5 MG TABS tablet TAKE 1 TABLET BY MOUTH TWICE DAILY. 60 tablet 5   erythromycin ophthalmic ointment Place 1 application into the left eye at bedtime.     ipratropium (ATROVENT) 0.03 % nasal spray Place 1 spray into both nostrils as needed.     Lactase (LACTAID PO) Take 1 tablet by mouth daily as needed (Lactose Intolerance).     loratadine (CLARITIN) 10 MG tablet Take 1 tablet (10 mg total) by mouth daily.  For allergies, congestion 30 tablet 3   LORazepam (ATIVAN) 0.5 MG tablet Take 0.5 mg by mouth daily as needed for anxiety or sleep.     Polyvinyl Alcohol-Povidone (REFRESH OP) Place 1 drop into both eyes 2 (two) times daily as needed (for dry eyes).      sodium chloride (OCEAN) 0.65 % SOLN nasal spray Place 1 spray into both nostrils as needed for congestion.     vitamin B-12 (CYANOCOBALAMIN) 1000 MCG tablet Take 1,000 mcg by mouth daily.     No current facility-administered medications for this visit.    Allergies  Allergen Reactions   Gluten Meal Diarrhea   Itraconazole Diarrhea and Itching   Lactose Intolerance (Gi) Diarrhea and Nausea Only   Statins Other (See Comments)    Myalgias    Review of Systems negative except from HPI and PMH  Physical Exam: BP 128/70    Pulse 84    Ht 5\' 5"  (1.651 m)    Wt 117 lb 12.8 oz (53.4 kg)    SpO2 93%    BMI 19.60 kg/m  Well developed and well nourished in no acute distress HENT normal Neck supple with JVP-flat Clear Device pocket well healed; without hematoma or erythema.  There is no tethering  Regular rate and rhythm no murmur Abd-soft with active BS No Clubbing cyanosis  edema Skin-warm and dry A & Oriented  Grossly normal sensory and motor function  ECG V pacing @ 84 -/15/44   Assessment and  Plan  Atrial fibrillation-permanent  Hypertension   AV junction ablation with partial return of conduction  Pacemaker-Medtronic    Afib is permanent and now complete heart block has reemerged and has been stable over the last 3 months.  Rates are now controlled.  No bleeding on the Eliquis.  Blood pressure is well controlled we will continue on Cardizem 60 twice daily.  Seh got lost coming here today recommended she think about a smart phone with voice-recognition technology to help with directions etc.

## 2022-01-09 NOTE — Patient Instructions (Signed)
Medication Instructions:  Your physician recommends that you continue on your current medications as directed. Please refer to the Current Medication list given to you today.  *If you need a refill on your cardiac medications before your next appointment, please call your pharmacy*   Lab Work: None ordered.  If you have labs (blood work) drawn today and your tests are completely normal, you will receive your results only by: [place] (if you have MyChart) OR A paper copy in the mail If you have any lab test that is abnormal or we need to change your treatment, we will call you to review the results.   Testing/Procedures: None ordered.    Follow-Up: At [facility], you and your health needs are our priority.  As part of our continuing mission to provide you with exceptional heart care, we have created designated Provider Care Teams.  These Care Teams include your primary Cardiologist (physician) and Advanced Practice Providers (APPs -  Physician Assistants and Nurse Practitioners) who all work together to provide you with the care you need, when you need it.  We recommend signing up for the patient portal called "MyChart".  Sign up information is provided on this After Visit Summary.  MyChart is used to connect with patients for Virtual Visits (Telemedicine).  Patients are able to view lab/test results, encounter notes, upcoming appointments, etc.  Non-urgent messages can be sent to your provider as well.   To learn more about what you can do with MyChart, go to [email or web address].    Your next appointment:   6 months with Dr Gardner Candle    Other Instructions Consider purchasing an [employer] I-Phone

## 2022-02-02 ENCOUNTER — Ambulatory Visit (INDEPENDENT_AMBULATORY_CARE_PROVIDER_SITE_OTHER): Payer: Medicare Other

## 2022-02-02 DIAGNOSIS — I495 Sick sinus syndrome: Secondary | ICD-10-CM

## 2022-02-02 LAB — CUP PACEART REMOTE DEVICE CHECK
Battery Remaining Longevity: 100 mo
Battery Voltage: 2.97 V
Brady Statistic AP VP Percent: 0 %
Brady Statistic AP VS Percent: 0 %
Brady Statistic AS VP Percent: 99.77 %
Brady Statistic AS VS Percent: 0.42 %
Brady Statistic RA Percent Paced: 0 %
Brady Statistic RV Percent Paced: 99.84 %
Date Time Interrogation Session: 20230309052259
Implantable Lead Implant Date: 20061019
Implantable Lead Implant Date: 20061019
Implantable Lead Location: 753859
Implantable Lead Location: 753860
Implantable Lead Model: 5076
Implantable Pulse Generator Implant Date: 20180611
Lead Channel Impedance Value: 304 Ohm
Lead Channel Impedance Value: 399 Ohm
Lead Channel Impedance Value: 494 Ohm
Lead Channel Impedance Value: 570 Ohm
Lead Channel Pacing Threshold Amplitude: 0.625 V
Lead Channel Pacing Threshold Amplitude: 1.125 V
Lead Channel Pacing Threshold Pulse Width: 0.4 ms
Lead Channel Pacing Threshold Pulse Width: 0.4 ms
Lead Channel Sensing Intrinsic Amplitude: 15.375 mV
Lead Channel Sensing Intrinsic Amplitude: 15.375 mV
Lead Channel Sensing Intrinsic Amplitude: 2.125 mV
Lead Channel Sensing Intrinsic Amplitude: 2.125 mV
Lead Channel Setting Pacing Amplitude: 2.5 V
Lead Channel Setting Pacing Pulse Width: 0.4 ms
Lead Channel Setting Sensing Sensitivity: 0.9 mV

## 2022-02-15 NOTE — Progress Notes (Signed)
Remote pacemaker transmission.   

## 2022-04-07 ENCOUNTER — Other Ambulatory Visit: Payer: Self-pay | Admitting: Internal Medicine

## 2022-04-07 DIAGNOSIS — I4819 Other persistent atrial fibrillation: Secondary | ICD-10-CM

## 2022-04-07 NOTE — Telephone Encounter (Addendum)
Prescription refill request for Eliquis received. ?Indication: Afib  ?Last office visit: 01/09/22 Caryl Comes)  ?Scr: 0.8 (09/16/21 via [facility])  ?Age: 88 ?Weight: 53.4kg ? ?Per Fuller Canada, PharmD, pt's dose should be decreased to 2.'5mg'$  BID. Called pt and made her aware of dose change, no answer. Left message to call back.  ? ?

## 2022-04-10 NOTE — Telephone Encounter (Signed)
Called and spoke with pt. Made her aware of dose change. Verbalized understanding.  ? ?Appropriate dose and refill sent to requested pharmacy.  ?

## 2022-05-04 ENCOUNTER — Ambulatory Visit (INDEPENDENT_AMBULATORY_CARE_PROVIDER_SITE_OTHER): Payer: Medicare Other

## 2022-05-04 DIAGNOSIS — I495 Sick sinus syndrome: Secondary | ICD-10-CM

## 2022-05-04 LAB — CUP PACEART REMOTE DEVICE CHECK
Battery Remaining Longevity: 89 mo
Battery Voltage: 2.97 V
Brady Statistic RA Percent Paced: 0 %
Brady Statistic RV Percent Paced: 99.56 %
Date Time Interrogation Session: 20230607231726
Implantable Lead Implant Date: 20061019
Implantable Lead Implant Date: 20061019
Implantable Lead Location: 753859
Implantable Lead Location: 753860
Implantable Lead Model: 5076
Implantable Pulse Generator Implant Date: 20180611
Lead Channel Impedance Value: 304 Ohm
Lead Channel Impedance Value: 399 Ohm
Lead Channel Impedance Value: 532 Ohm
Lead Channel Impedance Value: 570 Ohm
Lead Channel Pacing Threshold Amplitude: 0.625 V
Lead Channel Pacing Threshold Amplitude: 1.125 V
Lead Channel Pacing Threshold Pulse Width: 0.4 ms
Lead Channel Pacing Threshold Pulse Width: 0.4 ms
Lead Channel Sensing Intrinsic Amplitude: 1.125 mV
Lead Channel Sensing Intrinsic Amplitude: 1.125 mV
Lead Channel Sensing Intrinsic Amplitude: 15.375 mV
Lead Channel Sensing Intrinsic Amplitude: 15.375 mV
Lead Channel Setting Pacing Amplitude: 2.5 V
Lead Channel Setting Pacing Pulse Width: 0.4 ms
Lead Channel Setting Sensing Sensitivity: 0.9 mV

## 2022-05-12 NOTE — Progress Notes (Signed)
Remote pacemaker transmission.   

## 2022-05-15 DIAGNOSIS — S2242XD Multiple fractures of ribs, left side, subsequent encounter for fracture with routine healing: Secondary | ICD-10-CM | POA: Diagnosis not present

## 2022-05-15 DIAGNOSIS — R2689 Other abnormalities of gait and mobility: Secondary | ICD-10-CM | POA: Diagnosis not present

## 2022-05-15 DIAGNOSIS — G3184 Mild cognitive impairment, so stated: Secondary | ICD-10-CM | POA: Diagnosis not present

## 2022-06-23 ENCOUNTER — Other Ambulatory Visit: Payer: Self-pay | Admitting: Geriatric Medicine

## 2022-06-23 DIAGNOSIS — R911 Solitary pulmonary nodule: Secondary | ICD-10-CM

## 2022-06-23 DIAGNOSIS — E7800 Pure hypercholesterolemia, unspecified: Secondary | ICD-10-CM | POA: Diagnosis not present

## 2022-06-23 DIAGNOSIS — Z0000 Encounter for general adult medical examination without abnormal findings: Secondary | ICD-10-CM | POA: Diagnosis not present

## 2022-06-23 DIAGNOSIS — D696 Thrombocytopenia, unspecified: Secondary | ICD-10-CM | POA: Diagnosis not present

## 2022-06-23 DIAGNOSIS — Z79899 Other long term (current) drug therapy: Secondary | ICD-10-CM | POA: Diagnosis not present

## 2022-06-23 DIAGNOSIS — R197 Diarrhea, unspecified: Secondary | ICD-10-CM | POA: Diagnosis not present

## 2022-06-23 DIAGNOSIS — J449 Chronic obstructive pulmonary disease, unspecified: Secondary | ICD-10-CM | POA: Diagnosis not present

## 2022-06-23 DIAGNOSIS — I480 Paroxysmal atrial fibrillation: Secondary | ICD-10-CM | POA: Diagnosis not present

## 2022-06-23 DIAGNOSIS — R413 Other amnesia: Secondary | ICD-10-CM | POA: Diagnosis not present

## 2022-06-23 DIAGNOSIS — Z1331 Encounter for screening for depression: Secondary | ICD-10-CM | POA: Diagnosis not present

## 2022-06-23 DIAGNOSIS — N1831 Chronic kidney disease, stage 3a: Secondary | ICD-10-CM | POA: Diagnosis not present

## 2022-06-23 DIAGNOSIS — I700 Atherosclerosis of aorta: Secondary | ICD-10-CM | POA: Diagnosis not present

## 2022-06-23 DIAGNOSIS — R634 Abnormal weight loss: Secondary | ICD-10-CM | POA: Diagnosis not present

## 2022-08-03 ENCOUNTER — Ambulatory Visit (INDEPENDENT_AMBULATORY_CARE_PROVIDER_SITE_OTHER): Payer: Medicare Other

## 2022-08-03 DIAGNOSIS — I495 Sick sinus syndrome: Secondary | ICD-10-CM | POA: Diagnosis not present

## 2022-08-03 LAB — CUP PACEART REMOTE DEVICE CHECK
Battery Remaining Longevity: 79 mo
Battery Voltage: 2.96 V
Brady Statistic AP VP Percent: 0 %
Brady Statistic AP VS Percent: 0 %
Brady Statistic AS VP Percent: 99.77 %
Brady Statistic AS VS Percent: 0.27 %
Brady Statistic RA Percent Paced: 0 %
Brady Statistic RV Percent Paced: 99.55 %
Date Time Interrogation Session: 20230907001906
Implantable Lead Implant Date: 20061019
Implantable Lead Implant Date: 20061019
Implantable Lead Location: 753859
Implantable Lead Location: 753860
Implantable Lead Model: 5076
Implantable Pulse Generator Implant Date: 20180611
Lead Channel Impedance Value: 285 Ohm
Lead Channel Impedance Value: 361 Ohm
Lead Channel Impedance Value: 475 Ohm
Lead Channel Impedance Value: 532 Ohm
Lead Channel Pacing Threshold Amplitude: 0.625 V
Lead Channel Pacing Threshold Amplitude: 1.125 V
Lead Channel Pacing Threshold Pulse Width: 0.4 ms
Lead Channel Pacing Threshold Pulse Width: 0.4 ms
Lead Channel Sensing Intrinsic Amplitude: 1.75 mV
Lead Channel Sensing Intrinsic Amplitude: 1.75 mV
Lead Channel Sensing Intrinsic Amplitude: 15.375 mV
Lead Channel Sensing Intrinsic Amplitude: 15.375 mV
Lead Channel Setting Pacing Amplitude: 2.5 V
Lead Channel Setting Pacing Pulse Width: 0.4 ms
Lead Channel Setting Sensing Sensitivity: 0.9 mV

## 2022-08-19 NOTE — Progress Notes (Signed)
Remote pacemaker transmission.   

## 2022-09-05 DIAGNOSIS — H1820 Unspecified corneal edema: Secondary | ICD-10-CM | POA: Diagnosis not present

## 2022-09-05 DIAGNOSIS — H02105 Unspecified ectropion of left lower eyelid: Secondary | ICD-10-CM | POA: Diagnosis not present

## 2022-09-05 DIAGNOSIS — H0100B Unspecified blepharitis left eye, upper and lower eyelids: Secondary | ICD-10-CM | POA: Diagnosis not present

## 2022-09-05 DIAGNOSIS — H16202 Unspecified keratoconjunctivitis, left eye: Secondary | ICD-10-CM | POA: Diagnosis not present

## 2022-09-05 DIAGNOSIS — H0100A Unspecified blepharitis right eye, upper and lower eyelids: Secondary | ICD-10-CM | POA: Diagnosis not present

## 2022-09-14 DIAGNOSIS — R413 Other amnesia: Secondary | ICD-10-CM | POA: Diagnosis not present

## 2022-09-14 DIAGNOSIS — F321 Major depressive disorder, single episode, moderate: Secondary | ICD-10-CM | POA: Diagnosis not present

## 2022-09-14 DIAGNOSIS — Z1331 Encounter for screening for depression: Secondary | ICD-10-CM | POA: Diagnosis not present

## 2022-09-14 DIAGNOSIS — Z681 Body mass index (BMI) 19 or less, adult: Secondary | ICD-10-CM | POA: Diagnosis not present

## 2022-09-20 DIAGNOSIS — M6281 Muscle weakness (generalized): Secondary | ICD-10-CM | POA: Diagnosis not present

## 2022-09-20 DIAGNOSIS — R2689 Other abnormalities of gait and mobility: Secondary | ICD-10-CM | POA: Diagnosis not present

## 2022-09-20 DIAGNOSIS — S2242XD Multiple fractures of ribs, left side, subsequent encounter for fracture with routine healing: Secondary | ICD-10-CM | POA: Diagnosis not present

## 2022-09-20 DIAGNOSIS — Z9181 History of falling: Secondary | ICD-10-CM | POA: Diagnosis not present

## 2022-09-20 DIAGNOSIS — G3184 Mild cognitive impairment, so stated: Secondary | ICD-10-CM | POA: Diagnosis not present

## 2022-09-20 DIAGNOSIS — G459 Transient cerebral ischemic attack, unspecified: Secondary | ICD-10-CM | POA: Diagnosis not present

## 2022-09-20 DIAGNOSIS — R41841 Cognitive communication deficit: Secondary | ICD-10-CM | POA: Diagnosis not present

## 2022-09-21 DIAGNOSIS — H26491 Other secondary cataract, right eye: Secondary | ICD-10-CM | POA: Diagnosis not present

## 2022-09-21 DIAGNOSIS — H02105 Unspecified ectropion of left lower eyelid: Secondary | ICD-10-CM | POA: Diagnosis not present

## 2022-09-25 DIAGNOSIS — G459 Transient cerebral ischemic attack, unspecified: Secondary | ICD-10-CM | POA: Diagnosis not present

## 2022-09-25 DIAGNOSIS — S2242XD Multiple fractures of ribs, left side, subsequent encounter for fracture with routine healing: Secondary | ICD-10-CM | POA: Diagnosis not present

## 2022-09-25 DIAGNOSIS — G3184 Mild cognitive impairment, so stated: Secondary | ICD-10-CM | POA: Diagnosis not present

## 2022-09-25 DIAGNOSIS — R41841 Cognitive communication deficit: Secondary | ICD-10-CM | POA: Diagnosis not present

## 2022-09-25 DIAGNOSIS — Z9181 History of falling: Secondary | ICD-10-CM | POA: Diagnosis not present

## 2022-09-25 DIAGNOSIS — M6281 Muscle weakness (generalized): Secondary | ICD-10-CM | POA: Diagnosis not present

## 2022-09-26 DIAGNOSIS — S2242XD Multiple fractures of ribs, left side, subsequent encounter for fracture with routine healing: Secondary | ICD-10-CM | POA: Diagnosis not present

## 2022-09-26 DIAGNOSIS — R41841 Cognitive communication deficit: Secondary | ICD-10-CM | POA: Diagnosis not present

## 2022-09-26 DIAGNOSIS — M6281 Muscle weakness (generalized): Secondary | ICD-10-CM | POA: Diagnosis not present

## 2022-09-26 DIAGNOSIS — G459 Transient cerebral ischemic attack, unspecified: Secondary | ICD-10-CM | POA: Diagnosis not present

## 2022-09-26 DIAGNOSIS — G3184 Mild cognitive impairment, so stated: Secondary | ICD-10-CM | POA: Diagnosis not present

## 2022-09-26 DIAGNOSIS — Z9181 History of falling: Secondary | ICD-10-CM | POA: Diagnosis not present

## 2022-09-28 DIAGNOSIS — M6281 Muscle weakness (generalized): Secondary | ICD-10-CM | POA: Diagnosis not present

## 2022-09-28 DIAGNOSIS — Z9181 History of falling: Secondary | ICD-10-CM | POA: Diagnosis not present

## 2022-09-28 DIAGNOSIS — S2242XD Multiple fractures of ribs, left side, subsequent encounter for fracture with routine healing: Secondary | ICD-10-CM | POA: Diagnosis not present

## 2022-09-28 DIAGNOSIS — R2689 Other abnormalities of gait and mobility: Secondary | ICD-10-CM | POA: Diagnosis not present

## 2022-10-03 DIAGNOSIS — R2689 Other abnormalities of gait and mobility: Secondary | ICD-10-CM | POA: Diagnosis not present

## 2022-10-03 DIAGNOSIS — S2242XD Multiple fractures of ribs, left side, subsequent encounter for fracture with routine healing: Secondary | ICD-10-CM | POA: Diagnosis not present

## 2022-10-03 DIAGNOSIS — Z9181 History of falling: Secondary | ICD-10-CM | POA: Diagnosis not present

## 2022-10-03 DIAGNOSIS — M6281 Muscle weakness (generalized): Secondary | ICD-10-CM | POA: Diagnosis not present

## 2022-10-05 DIAGNOSIS — F320 Major depressive disorder, single episode, mild: Secondary | ICD-10-CM | POA: Diagnosis not present

## 2022-10-05 DIAGNOSIS — R634 Abnormal weight loss: Secondary | ICD-10-CM | POA: Diagnosis not present

## 2022-10-05 DIAGNOSIS — R413 Other amnesia: Secondary | ICD-10-CM | POA: Diagnosis not present

## 2022-10-11 DIAGNOSIS — R2689 Other abnormalities of gait and mobility: Secondary | ICD-10-CM | POA: Diagnosis not present

## 2022-10-11 DIAGNOSIS — Z9181 History of falling: Secondary | ICD-10-CM | POA: Diagnosis not present

## 2022-10-11 DIAGNOSIS — M6281 Muscle weakness (generalized): Secondary | ICD-10-CM | POA: Diagnosis not present

## 2022-10-11 DIAGNOSIS — S2242XD Multiple fractures of ribs, left side, subsequent encounter for fracture with routine healing: Secondary | ICD-10-CM | POA: Diagnosis not present

## 2022-10-12 ENCOUNTER — Other Ambulatory Visit: Payer: Self-pay | Admitting: Internal Medicine

## 2022-10-12 ENCOUNTER — Ambulatory Visit
Admission: RE | Admit: 2022-10-12 | Discharge: 2022-10-12 | Disposition: A | Discharge status: Home / Self Care | Payer: Medicare Other | Source: Ambulatory Visit | Attending: Internal Medicine | Admitting: Internal Medicine

## 2022-10-12 ENCOUNTER — Other Ambulatory Visit (INDEPENDENT_AMBULATORY_CARE_PROVIDER_SITE_OTHER): Payer: Medicare Other

## 2022-10-12 DIAGNOSIS — R634 Abnormal weight loss: Secondary | ICD-10-CM

## 2022-10-12 DIAGNOSIS — R519 Headache, unspecified: Secondary | ICD-10-CM | POA: Diagnosis not present

## 2022-10-12 DIAGNOSIS — R413 Other amnesia: Secondary | ICD-10-CM

## 2022-10-12 DIAGNOSIS — R8290 Unspecified abnormal findings in urine: Secondary | ICD-10-CM

## 2022-10-12 DIAGNOSIS — F320 Major depressive disorder, single episode, mild: Secondary | ICD-10-CM | POA: Diagnosis not present

## 2022-10-12 NOTE — Addendum Note (Signed)
Addended by: Kynzlee Hucker on: 10/12/2022 04:03 PM   Modules accepted: Orders, Level of Service

## 2022-10-13 DIAGNOSIS — R2689 Other abnormalities of gait and mobility: Secondary | ICD-10-CM | POA: Diagnosis not present

## 2022-10-13 DIAGNOSIS — Z9181 History of falling: Secondary | ICD-10-CM | POA: Diagnosis not present

## 2022-10-13 DIAGNOSIS — S2242XD Multiple fractures of ribs, left side, subsequent encounter for fracture with routine healing: Secondary | ICD-10-CM | POA: Diagnosis not present

## 2022-10-13 DIAGNOSIS — M6281 Muscle weakness (generalized): Secondary | ICD-10-CM | POA: Diagnosis not present

## 2022-10-13 LAB — CBC WITH DIFFERENTIAL/PLATELET
Basophils Absolute: 0.1 10*3/uL (ref 0.0–0.2)
Basos: 1 %
EOS (ABSOLUTE): 0.2 10*3/uL (ref 0.0–0.4)
Eos: 3 %
Hematocrit: 48.4 % — ABNORMAL HIGH (ref 34.0–46.6)
Hemoglobin: 15.9 g/dL (ref 11.1–15.9)
Immature Grans (Abs): 0 10*3/uL (ref 0.0–0.1)
Immature Granulocytes: 0 %
Lymphocytes Absolute: 1.5 10*3/uL (ref 0.7–3.1)
Lymphs: 18 %
MCH: 32.3 pg (ref 26.6–33.0)
MCHC: 32.9 g/dL (ref 31.5–35.7)
MCV: 98 fL — ABNORMAL HIGH (ref 79–97)
Monocytes Absolute: 0.7 10*3/uL (ref 0.1–0.9)
Monocytes: 9 %
Neutrophils Absolute: 5.4 10*3/uL (ref 1.4–7.0)
Neutrophils: 69 %
Platelets: 121 10*3/uL — ABNORMAL LOW (ref 150–450)
RBC: 4.93 x10E6/uL (ref 3.77–5.28)
RDW: 13.3 % (ref 11.7–15.4)
WBC: 7.9 10*3/uL (ref 3.4–10.8)

## 2022-10-13 LAB — COMPREHENSIVE METABOLIC PANEL
ALT: 27 IU/L (ref 0–32)
AST: 25 IU/L (ref 0–40)
Albumin/Globulin Ratio: 2 (ref 1.2–2.2)
Albumin: 4.7 g/dL (ref 3.7–4.7)
Alkaline Phosphatase: 106 IU/L (ref 44–121)
BUN/Creatinine Ratio: 15 (ref 12–28)
BUN: 20 mg/dL (ref 8–27)
Bilirubin Total: 1.1 mg/dL (ref 0.0–1.2)
CO2: 23 mmol/L (ref 20–29)
Calcium: 9.8 mg/dL (ref 8.7–10.3)
Chloride: 101 mmol/L (ref 96–106)
Creatinine, Ser: 1.3 mg/dL — ABNORMAL HIGH (ref 0.57–1.00)
Globulin, Total: 2.3 g/dL (ref 1.5–4.5)
Glucose: 157 mg/dL — ABNORMAL HIGH (ref 70–99)
Potassium: 3.9 mmol/L (ref 3.5–5.2)
Sodium: 145 mmol/L — ABNORMAL HIGH (ref 134–144)
Total Protein: 7 g/dL (ref 6.0–8.5)
eGFR: 40 mL/min/{1.73_m2} — ABNORMAL LOW (ref >59)

## 2022-10-13 LAB — B12 AND FOLATE PANEL
Folate: 11.5 ng/mL (ref >3.0)
Vitamin B-12: 565 pg/mL (ref 232–1245)

## 2022-10-13 LAB — RPR: RPR Ser Ql: NONREACTIVE

## 2022-10-13 LAB — TSH: TSH: 4.98 u[IU]/mL — ABNORMAL HIGH (ref 0.450–4.500)

## 2022-10-13 LAB — SPECIMEN STATUS REPORT

## 2022-11-01 ENCOUNTER — Other Ambulatory Visit: Payer: Self-pay

## 2022-11-02 ENCOUNTER — Ambulatory Visit (INDEPENDENT_AMBULATORY_CARE_PROVIDER_SITE_OTHER): Payer: Medicare Other | Admitting: Internal Medicine

## 2022-11-02 ENCOUNTER — Other Ambulatory Visit: Payer: Self-pay | Admitting: Internal Medicine

## 2022-11-02 ENCOUNTER — Ambulatory Visit (INDEPENDENT_AMBULATORY_CARE_PROVIDER_SITE_OTHER): Payer: Medicare Other

## 2022-11-02 VITALS — BP 130/84 | HR 62 | Temp 98.0°F | Resp 18 | Ht 65.0 in | Wt 106.2 lb

## 2022-11-02 DIAGNOSIS — R739 Hyperglycemia, unspecified: Secondary | ICD-10-CM

## 2022-11-02 DIAGNOSIS — F320 Major depressive disorder, single episode, mild: Secondary | ICD-10-CM

## 2022-11-02 DIAGNOSIS — I495 Sick sinus syndrome: Secondary | ICD-10-CM

## 2022-11-02 DIAGNOSIS — R413 Other amnesia: Secondary | ICD-10-CM | POA: Diagnosis not present

## 2022-11-02 DIAGNOSIS — N390 Urinary tract infection, site not specified: Secondary | ICD-10-CM

## 2022-11-02 DIAGNOSIS — R634 Abnormal weight loss: Secondary | ICD-10-CM | POA: Insufficient documentation

## 2022-11-02 DIAGNOSIS — R300 Dysuria: Secondary | ICD-10-CM

## 2022-11-02 LAB — CUP PACEART REMOTE DEVICE CHECK
Battery Remaining Longevity: 72 mo
Battery Voltage: 2.96 V
Brady Statistic AP VP Percent: 0 %
Brady Statistic AP VS Percent: 0 %
Brady Statistic AS VP Percent: 99.61 %
Brady Statistic AS VS Percent: 0.48 %
Brady Statistic RA Percent Paced: 0 %
Brady Statistic RV Percent Paced: 98.25 %
Date Time Interrogation Session: 20231206231132
Implantable Lead Connection Status: 753985
Implantable Lead Connection Status: 753985
Implantable Lead Implant Date: 20061019
Implantable Lead Implant Date: 20061019
Implantable Lead Location: 753859
Implantable Lead Location: 753860
Implantable Lead Model: 5076
Implantable Pulse Generator Implant Date: 20180611
Lead Channel Impedance Value: 304 Ohm
Lead Channel Impedance Value: 361 Ohm
Lead Channel Impedance Value: 513 Ohm
Lead Channel Impedance Value: 551 Ohm
Lead Channel Pacing Threshold Amplitude: 0.625 V
Lead Channel Pacing Threshold Amplitude: 1.125 V
Lead Channel Pacing Threshold Pulse Width: 0.4 ms
Lead Channel Pacing Threshold Pulse Width: 0.4 ms
Lead Channel Sensing Intrinsic Amplitude: 0.625 mV
Lead Channel Sensing Intrinsic Amplitude: 0.625 mV
Lead Channel Sensing Intrinsic Amplitude: 6.375 mV
Lead Channel Sensing Intrinsic Amplitude: 6.375 mV
Lead Channel Setting Pacing Amplitude: 2.5 V
Lead Channel Setting Pacing Pulse Width: 0.4 ms
Lead Channel Setting Sensing Sensitivity: 0.9 mV
Zone Setting Status: 755011
Zone Setting Status: 755011

## 2022-11-02 LAB — POCT URINALYSIS DIPSTICK
Bilirubin, UA: NEGATIVE
Glucose, UA: NEGATIVE
Ketones, UA: 5
Nitrite, UA: POSITIVE
Protein, UA: POSITIVE — AB
Spec Grav, UA: >=1.03 — AB (ref 1.010–1.025)
Urobilinogen, UA: 0.2 E.U./dL
pH, UA: 5 (ref 5.0–8.0)

## 2022-11-02 MED ORDER — CIPROFLOXACIN HCL 500 MG PO TABS: 500.0000 mg | ORAL_TABLET | Freq: Two times a day (BID) | ORAL | 0 refills | Status: DC

## 2022-11-02 MED ORDER — DONEPEZIL HCL 5 MG PO TABS: 5.0000 mg | ORAL_TABLET | Freq: Every day | ORAL | 1 refills | Status: DC

## 2022-11-02 NOTE — Assessment & Plan Note (Signed)
We did a UA today that shows she has a UTI.  We will place her on empiric cipro and send for culture.

## 2022-11-02 NOTE — Assessment & Plan Note (Signed)
Her depression seems to be doing better and is stable.  We will continue to monitor.

## 2022-11-02 NOTE — Assessment & Plan Note (Signed)
She was noted on lab workup for her memory loss to have some elevated glucose.  We will check a HgBA1c to make sure she has not developed diabetes.
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
## 2022-11-02 NOTE — Assessment & Plan Note (Signed)
Her memory has worsened some per her granddaughter but again she has a UTI.  I am going to start her on some aricept at night as well.

## 2022-11-02 NOTE — Assessment & Plan Note (Signed)
She is eating well per her family but still has weight loss.  I reviewed her labs.  I am going to increase her mirtazapine to '15mg'$  qhs and I asked them to start her on glucerna one bottle twice a day.  I have asked them to liberalize her diet.

## 2022-11-05 LAB — URINE CULTURE

## 2022-11-05 LAB — HGB A1C W/O EAG: Hgb A1c MFr Bld: 6.2 % — ABNORMAL HIGH (ref 4.8–5.6)

## 2022-11-14 NOTE — Progress Notes (Signed)
Pts daughter informed she is now in memory care.

## 2022-11-14 NOTE — Progress Notes (Signed)
P/t's daughter informed

## 2022-11-14 NOTE — Progress Notes (Signed)
Per the daughter she will not take a statin. She is currently in the assisted living section now.

## 2022-11-21 DIAGNOSIS — R2681 Unsteadiness on feet: Secondary | ICD-10-CM | POA: Diagnosis not present

## 2022-11-21 DIAGNOSIS — G459 Transient cerebral ischemic attack, unspecified: Secondary | ICD-10-CM | POA: Diagnosis not present

## 2022-11-21 DIAGNOSIS — R2689 Other abnormalities of gait and mobility: Secondary | ICD-10-CM | POA: Diagnosis not present

## 2022-11-21 DIAGNOSIS — S2242XD Multiple fractures of ribs, left side, subsequent encounter for fracture with routine healing: Secondary | ICD-10-CM | POA: Diagnosis not present

## 2022-11-21 DIAGNOSIS — G3184 Mild cognitive impairment, so stated: Secondary | ICD-10-CM | POA: Diagnosis not present

## 2022-11-21 DIAGNOSIS — Z9181 History of falling: Secondary | ICD-10-CM | POA: Diagnosis not present

## 2022-11-21 DIAGNOSIS — M6281 Muscle weakness (generalized): Secondary | ICD-10-CM | POA: Diagnosis not present

## 2022-11-24 NOTE — Progress Notes (Signed)
Remote pacemaker transmission.   

## 2022-11-28 DIAGNOSIS — S2242XD Multiple fractures of ribs, left side, subsequent encounter for fracture with routine healing: Secondary | ICD-10-CM | POA: Diagnosis not present

## 2022-11-28 DIAGNOSIS — R2689 Other abnormalities of gait and mobility: Secondary | ICD-10-CM | POA: Diagnosis not present

## 2022-11-28 DIAGNOSIS — M6281 Muscle weakness (generalized): Secondary | ICD-10-CM | POA: Diagnosis not present

## 2022-11-28 DIAGNOSIS — R2681 Unsteadiness on feet: Secondary | ICD-10-CM | POA: Diagnosis not present

## 2022-11-28 DIAGNOSIS — G3184 Mild cognitive impairment, so stated: Secondary | ICD-10-CM | POA: Diagnosis not present

## 2022-11-28 DIAGNOSIS — R4181 Age-related cognitive decline: Secondary | ICD-10-CM | POA: Diagnosis not present

## 2022-11-28 DIAGNOSIS — Z9181 History of falling: Secondary | ICD-10-CM | POA: Diagnosis not present

## 2022-11-28 DIAGNOSIS — G459 Transient cerebral ischemic attack, unspecified: Secondary | ICD-10-CM | POA: Diagnosis not present

## 2022-11-30 DIAGNOSIS — S2242XD Multiple fractures of ribs, left side, subsequent encounter for fracture with routine healing: Secondary | ICD-10-CM | POA: Diagnosis not present

## 2022-11-30 DIAGNOSIS — R2689 Other abnormalities of gait and mobility: Secondary | ICD-10-CM | POA: Diagnosis not present

## 2022-11-30 DIAGNOSIS — G459 Transient cerebral ischemic attack, unspecified: Secondary | ICD-10-CM | POA: Diagnosis not present

## 2022-11-30 DIAGNOSIS — Z9181 History of falling: Secondary | ICD-10-CM | POA: Diagnosis not present

## 2022-11-30 DIAGNOSIS — G3184 Mild cognitive impairment, so stated: Secondary | ICD-10-CM | POA: Diagnosis not present

## 2022-11-30 DIAGNOSIS — M6281 Muscle weakness (generalized): Secondary | ICD-10-CM | POA: Diagnosis not present

## 2022-12-01 DIAGNOSIS — E860 Dehydration: Secondary | ICD-10-CM

## 2022-12-01 DIAGNOSIS — I480 Paroxysmal atrial fibrillation: Secondary | ICD-10-CM

## 2022-12-01 DIAGNOSIS — A09 Infectious gastroenteritis and colitis, unspecified: Secondary | ICD-10-CM

## 2022-12-01 DIAGNOSIS — R531 Weakness: Secondary | ICD-10-CM

## 2022-12-03 ENCOUNTER — Encounter (HOSPITAL_COMMUNITY): Payer: Self-pay

## 2022-12-03 ENCOUNTER — Other Ambulatory Visit: Payer: Self-pay

## 2022-12-03 ENCOUNTER — Inpatient Hospital Stay (HOSPITAL_COMMUNITY)
Admission: EM | Admit: 2022-12-03 | Discharge: 2022-12-08 | DRG: 391 | Disposition: A | Discharge status: Skilled Nursing Facility | Payer: Medicare Other | Source: Skilled Nursing Facility | Attending: Internal Medicine | Admitting: Internal Medicine

## 2022-12-03 DIAGNOSIS — Z87891 Personal history of nicotine dependence: Secondary | ICD-10-CM | POA: Diagnosis not present

## 2022-12-03 DIAGNOSIS — N2889 Other specified disorders of kidney and ureter: Secondary | ICD-10-CM | POA: Diagnosis not present

## 2022-12-03 DIAGNOSIS — R197 Diarrhea, unspecified: Secondary | ICD-10-CM

## 2022-12-03 DIAGNOSIS — E739 Lactose intolerance, unspecified: Secondary | ICD-10-CM | POA: Diagnosis present

## 2022-12-03 DIAGNOSIS — R41841 Cognitive communication deficit: Secondary | ICD-10-CM | POA: Diagnosis not present

## 2022-12-03 DIAGNOSIS — R7989 Other specified abnormal findings of blood chemistry: Secondary | ICD-10-CM | POA: Diagnosis not present

## 2022-12-03 DIAGNOSIS — Z681 Body mass index (BMI) 19 or less, adult: Secondary | ICD-10-CM | POA: Diagnosis not present

## 2022-12-03 DIAGNOSIS — R630 Anorexia: Secondary | ICD-10-CM | POA: Diagnosis present

## 2022-12-03 DIAGNOSIS — E876 Hypokalemia: Secondary | ICD-10-CM | POA: Diagnosis present

## 2022-12-03 DIAGNOSIS — R7401 Elevation of levels of liver transaminase levels: Secondary | ICD-10-CM | POA: Diagnosis present

## 2022-12-03 DIAGNOSIS — Z66 Do not resuscitate: Secondary | ICD-10-CM | POA: Diagnosis present

## 2022-12-03 DIAGNOSIS — E860 Dehydration: Principal | ICD-10-CM | POA: Diagnosis present

## 2022-12-03 DIAGNOSIS — Z950 Presence of cardiac pacemaker: Secondary | ICD-10-CM | POA: Diagnosis not present

## 2022-12-03 DIAGNOSIS — Z86718 Personal history of other venous thrombosis and embolism: Secondary | ICD-10-CM | POA: Diagnosis not present

## 2022-12-03 DIAGNOSIS — Z7401 Bed confinement status: Secondary | ICD-10-CM | POA: Diagnosis not present

## 2022-12-03 DIAGNOSIS — F32A Depression, unspecified: Secondary | ICD-10-CM | POA: Diagnosis present

## 2022-12-03 DIAGNOSIS — I4819 Other persistent atrial fibrillation: Secondary | ICD-10-CM

## 2022-12-03 DIAGNOSIS — R2689 Other abnormalities of gait and mobility: Secondary | ICD-10-CM | POA: Diagnosis not present

## 2022-12-03 DIAGNOSIS — I442 Atrioventricular block, complete: Secondary | ICD-10-CM | POA: Diagnosis not present

## 2022-12-03 DIAGNOSIS — R278 Other lack of coordination: Secondary | ICD-10-CM | POA: Diagnosis not present

## 2022-12-03 DIAGNOSIS — Z79899 Other long term (current) drug therapy: Secondary | ICD-10-CM

## 2022-12-03 DIAGNOSIS — Z8249 Family history of ischemic heart disease and other diseases of the circulatory system: Secondary | ICD-10-CM

## 2022-12-03 DIAGNOSIS — I480 Paroxysmal atrial fibrillation: Secondary | ICD-10-CM | POA: Diagnosis present

## 2022-12-03 DIAGNOSIS — R531 Weakness: Secondary | ICD-10-CM | POA: Diagnosis present

## 2022-12-03 DIAGNOSIS — E43 Unspecified severe protein-calorie malnutrition: Secondary | ICD-10-CM | POA: Diagnosis present

## 2022-12-03 DIAGNOSIS — J309 Allergic rhinitis, unspecified: Secondary | ICD-10-CM | POA: Diagnosis not present

## 2022-12-03 DIAGNOSIS — M6281 Muscle weakness (generalized): Secondary | ICD-10-CM | POA: Diagnosis not present

## 2022-12-03 DIAGNOSIS — N179 Acute kidney failure, unspecified: Secondary | ICD-10-CM | POA: Diagnosis present

## 2022-12-03 DIAGNOSIS — S2242XD Multiple fractures of ribs, left side, subsequent encounter for fracture with routine healing: Secondary | ICD-10-CM | POA: Diagnosis not present

## 2022-12-03 DIAGNOSIS — Z209 Contact with and (suspected) exposure to unspecified communicable disease: Secondary | ICD-10-CM | POA: Diagnosis not present

## 2022-12-03 DIAGNOSIS — Z9181 History of falling: Secondary | ICD-10-CM | POA: Diagnosis not present

## 2022-12-03 DIAGNOSIS — Z823 Family history of stroke: Secondary | ICD-10-CM

## 2022-12-03 DIAGNOSIS — R296 Repeated falls: Secondary | ICD-10-CM | POA: Diagnosis present

## 2022-12-03 DIAGNOSIS — D696 Thrombocytopenia, unspecified: Secondary | ICD-10-CM | POA: Diagnosis present

## 2022-12-03 DIAGNOSIS — I4821 Permanent atrial fibrillation: Secondary | ICD-10-CM | POA: Diagnosis not present

## 2022-12-03 DIAGNOSIS — I340 Nonrheumatic mitral (valve) insufficiency: Secondary | ICD-10-CM | POA: Diagnosis present

## 2022-12-03 DIAGNOSIS — R627 Adult failure to thrive: Secondary | ICD-10-CM | POA: Diagnosis present

## 2022-12-03 DIAGNOSIS — F05 Delirium due to known physiological condition: Secondary | ICD-10-CM | POA: Diagnosis present

## 2022-12-03 DIAGNOSIS — E569 Vitamin deficiency, unspecified: Secondary | ICD-10-CM | POA: Diagnosis not present

## 2022-12-03 DIAGNOSIS — M1990 Unspecified osteoarthritis, unspecified site: Secondary | ICD-10-CM | POA: Diagnosis present

## 2022-12-03 DIAGNOSIS — Z8673 Personal history of transient ischemic attack (TIA), and cerebral infarction without residual deficits: Secondary | ICD-10-CM

## 2022-12-03 DIAGNOSIS — I495 Sick sinus syndrome: Secondary | ICD-10-CM | POA: Diagnosis present

## 2022-12-03 DIAGNOSIS — A09 Infectious gastroenteritis and colitis, unspecified: Secondary | ICD-10-CM | POA: Diagnosis not present

## 2022-12-03 DIAGNOSIS — A0839 Other viral enteritis: Principal | ICD-10-CM | POA: Diagnosis present

## 2022-12-03 DIAGNOSIS — R1110 Vomiting, unspecified: Secondary | ICD-10-CM | POA: Diagnosis not present

## 2022-12-03 DIAGNOSIS — A0472 Enterocolitis due to Clostridium difficile, not specified as recurrent: Secondary | ICD-10-CM | POA: Diagnosis not present

## 2022-12-03 DIAGNOSIS — N281 Cyst of kidney, acquired: Secondary | ICD-10-CM | POA: Diagnosis not present

## 2022-12-03 DIAGNOSIS — A084 Viral intestinal infection, unspecified: Secondary | ICD-10-CM | POA: Diagnosis not present

## 2022-12-03 DIAGNOSIS — Z888 Allergy status to other drugs, medicaments and biological substances status: Secondary | ICD-10-CM

## 2022-12-03 DIAGNOSIS — R410 Disorientation, unspecified: Secondary | ICD-10-CM | POA: Diagnosis not present

## 2022-12-03 DIAGNOSIS — Z833 Family history of diabetes mellitus: Secondary | ICD-10-CM

## 2022-12-03 DIAGNOSIS — I10 Essential (primary) hypertension: Secondary | ICD-10-CM | POA: Diagnosis present

## 2022-12-03 DIAGNOSIS — R195 Other fecal abnormalities: Secondary | ICD-10-CM | POA: Diagnosis not present

## 2022-12-03 DIAGNOSIS — Z809 Family history of malignant neoplasm, unspecified: Secondary | ICD-10-CM | POA: Diagnosis not present

## 2022-12-03 DIAGNOSIS — R413 Other amnesia: Secondary | ICD-10-CM | POA: Diagnosis not present

## 2022-12-03 DIAGNOSIS — Z7901 Long term (current) use of anticoagulants: Secondary | ICD-10-CM | POA: Diagnosis not present

## 2022-12-03 DIAGNOSIS — Z87440 Personal history of urinary (tract) infections: Secondary | ICD-10-CM

## 2022-12-03 DIAGNOSIS — Z781 Physical restraint status: Secondary | ICD-10-CM

## 2022-12-03 LAB — CBC
HCT: 44.7 % (ref 36.0–46.0)
Hemoglobin: 15 g/dL (ref 12.0–15.0)
MCH: 30.6 pg (ref 26.0–34.0)
MCHC: 33.6 g/dL (ref 30.0–36.0)
MCV: 91.2 fL (ref 80.0–100.0)
Platelets: 80 10*3/uL — ABNORMAL LOW (ref 150–400)
RBC: 4.9 MIL/uL (ref 3.87–5.11)
RDW: 14.6 % (ref 11.5–15.5)
WBC: 5.4 10*3/uL (ref 4.0–10.5)
nRBC: 0.4 % — ABNORMAL HIGH (ref 0.0–0.2)

## 2022-12-03 LAB — COMPREHENSIVE METABOLIC PANEL
ALT: 106 U/L — ABNORMAL HIGH (ref 0–44)
AST: 66 U/L — ABNORMAL HIGH (ref 15–41)
Albumin: 3.5 g/dL (ref 3.5–5.0)
Alkaline Phosphatase: 109 U/L (ref 38–126)
Anion gap: 13 (ref 5–15)
BUN: 53 mg/dL — ABNORMAL HIGH (ref 8–23)
CO2: 23 mmol/L (ref 22–32)
Calcium: 8.1 mg/dL — ABNORMAL LOW (ref 8.9–10.3)
Chloride: 101 mmol/L (ref 98–111)
Creatinine, Ser: 2.02 mg/dL — ABNORMAL HIGH (ref 0.44–1.00)
GFR, Estimated: 23 mL/min — ABNORMAL LOW (ref >60)
Glucose, Bld: 114 mg/dL — ABNORMAL HIGH (ref 70–99)
Potassium: 2.2 mmol/L — CL (ref 3.5–5.1)
Sodium: 137 mmol/L (ref 135–145)
Total Bilirubin: 1.5 mg/dL — ABNORMAL HIGH (ref 0.3–1.2)
Total Protein: 6.4 g/dL — ABNORMAL LOW (ref 6.5–8.1)

## 2022-12-03 LAB — URINALYSIS, ROUTINE W REFLEX MICROSCOPIC
Bacteria, UA: NONE SEEN
Bilirubin Urine: NEGATIVE
Glucose, UA: NEGATIVE mg/dL
Hgb urine dipstick: NEGATIVE
Ketones, ur: NEGATIVE mg/dL
Nitrite: NEGATIVE
Protein, ur: NEGATIVE mg/dL
Specific Gravity, Urine: 1.012 (ref 1.005–1.030)
pH: 5 (ref 5.0–8.0)

## 2022-12-03 LAB — MAGNESIUM: Magnesium: 2.3 mg/dL (ref 1.7–2.4)

## 2022-12-03 LAB — CBG MONITORING, ED: Glucose-Capillary: 91 mg/dL (ref 70–99)

## 2022-12-03 MED ORDER — POTASSIUM CHLORIDE 10 MEQ/100ML IV SOLN
INTRAVENOUS | Status: AC
  Administered 2022-12-03: 10 meq
  Filled 2022-12-03: qty 100

## 2022-12-03 MED ORDER — SODIUM CHLORIDE 0.9 % IV BOLUS
1000.0000 mL | Freq: Once | INTRAVENOUS | Status: AC
  Administered 2022-12-03: 1000 mL via INTRAVENOUS

## 2022-12-03 MED ORDER — PROCHLORPERAZINE EDISYLATE 10 MG/2ML IJ SOLN: 10.0000 mg | Freq: Four times a day (QID) | INTRAMUSCULAR | Status: DC | PRN

## 2022-12-03 MED ORDER — POTASSIUM CHLORIDE 20 MEQ PO PACK
40.0000 meq | PACK | Freq: Every day | ORAL | Status: DC
  Administered 2022-12-03: 40 meq via ORAL
  Filled 2022-12-03: qty 2

## 2022-12-03 MED ORDER — POTASSIUM CHLORIDE 10 MEQ/100ML IV SOLN
10.0000 meq | INTRAVENOUS | Status: AC
  Administered 2022-12-03 (×5): 10 meq via INTRAVENOUS
  Filled 2022-12-03 (×6): qty 100

## 2022-12-03 MED ORDER — LACTATED RINGERS IV SOLN: INTRAVENOUS | Status: DC

## 2022-12-03 NOTE — ED Triage Notes (Signed)
Pt arrived via [facility], diarrhea x3 months, Cdiff test pending at facility. Started on vancomycin yesterday for same. Has had worsenig PO intake for the last two days.

## 2022-12-03 NOTE — ED Provider Notes (Signed)
[facility] DEPT Provider Note   CSN: [id number] Arrival date & time: 12/03/22  1308     History  Chief Complaint  Patient presents with   Diarrhea    [name] [name] is a 89 y.o. female.  The history is provided by the patient, medical records and a relative. No language interpreter was used.  Diarrhea    89 year old female with significant history of A-fib currently on Eliquis, has pacemaker, hypertension, frequent falls, brought here via EMS for evaluation of diarrhea.  Patient is a poor historian, history obtained through daughter who is at bedside.  Patient previously was doing well and was living at an assisted facility.  She developed a urinary tract infection in November and was placed ciprofloxacin.  For the past several weeks patient has had persistent loose stools, decrease in appetite, nausea and vomiting and increased generalized weakness.  She was seen and evaluated by her doctor yesterday who voiced concerns that she may have C. difficile.  C. difficile test have been obtained and patient was preemptively started on oral vancomycin.  Patient brought here today due to concerns of dehydration as she is eating and drinking less.  No report of any fever or dysuria patient denies any significant pain.  Home Medications Prior to Admission medications   Medication Sig Start Date End Date Taking? Authorizing Provider  apixaban (ELIQUIS) 2.5 MG TABS tablet Take 1 tablet (2.5 mg total) by mouth 2 (two) times daily. 04/10/22   Josue Hector, MD  Cholecalciferol (VITAMIN D3) 2000 UNITS TABS Take 2,000 Units by mouth daily.    [provider]  colchicine 0.6 MG tablet Take 0.6 mg by mouth 2 (two) times daily. 06/21/21   [provider]  desonide (DESOWEN) 0.05 % ointment Apply 1 application topically 2 (two) times daily as needed (for irritation).     [provider]  diltiazem (CARDIZEM) 30 MG tablet TAKE 1 TABLET ONCE  DAILY AS NEEDED FOR PALPATATION. 07/31/19   Baldwin Jamaica, PA-C  diltiazem (CARDIZEM) 60 MG tablet Take 60 mg by mouth 2 (two) times daily. 06/22/21   [provider]  donepezil (ARICEPT) 5 MG tablet Take 1 tablet (5 mg total) by mouth at bedtime. 11/02/22   Townsend Roger, MD  erythromycin ophthalmic ointment Place 1 application into the left eye at bedtime. 01/25/21   [provider]  ipratropium (ATROVENT) 0.03 % nasal spray Place 1 spray into both nostrils as needed. 05/10/21   [provider]  loratadine (CLARITIN) 10 MG tablet Take 1 tablet (10 mg total) by mouth daily. For allergies, congestion 12/03/17   Rai, Ripudeep K, MD  LORazepam (ATIVAN) 0.5 MG tablet Take 0.5 mg by mouth daily as needed for anxiety or sleep.    [provider]  memantine (NAMENDA) 10 MG tablet Take 10 mg by mouth 2 (two) times daily. 10/15/22   [provider]  mirtazapine (REMERON) 7.5 MG tablet Take 7.5 mg by mouth at bedtime.    [provider]  neomycin-polymyxin b-dexamethasone (MAXITROL) 3.5-10000-0.1 SUSP Place into the left eye. 09/05/22   [provider]  vitamin B-12 (CYANOCOBALAMIN) 1000 MCG tablet Take 1,000 mcg by mouth daily.    [provider]      Allergies    Gluten meal, Itraconazole, Lactose intolerance (gi), and Statins    Review of Systems   Review of Systems  Gastrointestinal:  Positive for diarrhea.  All other systems reviewed and are negative.  Physical Exam Updated Vital Signs BP 120/65 (BP Location: Left Arm)   Pulse 78   Temp 97.7 F (36.5 C) (Oral)   Resp 16   SpO2 96%  Physical Exam Vitals and nursing note reviewed.  Constitutional:      General: She is not in acute distress.    Appearance: She is well-developed.     Comments: Elderly female appears older than her stated age and appears dehydrated.  HENT:     Head: Normocephalic and atraumatic.     Mouth/Throat:     Comments: Lips and mouth are  dry Eyes:     Conjunctiva/sclera: Conjunctivae normal.  Cardiovascular:     Rate and Rhythm: Normal rate and regular rhythm.     Pulses: Normal pulses.     Heart sounds: Normal heart sounds.  Pulmonary:     Effort: Pulmonary effort is normal.  Abdominal:     Palpations: Abdomen is soft.     Tenderness: There is no abdominal tenderness.  Musculoskeletal:     Cervical back: Normal range of motion and neck supple.  Skin:    Findings: No rash.  Neurological:     Mental Status: She is alert.     Comments: Alert and oriented to self but having difficulty recalling month, year, or current president.  Psychiatric:        Mood and Affect: Mood normal.     ED Results / Procedures / Treatments   Labs (all labs ordered are listed, but only abnormal results are displayed) Labs Reviewed  CBC - Abnormal; Notable for the following components:      Result Value   Platelets 80 (*)    nRBC 0.4 (*)    All other components within normal limits  COMPREHENSIVE METABOLIC PANEL - Abnormal; Notable for the following components:   Potassium 2.2 (*)    Glucose, Bld 114 (*)    BUN 53 (*)    Creatinine, Ser 2.02 (*)    Calcium 8.1 (*)    Total Protein 6.4 (*)    AST 66 (*)    ALT 106 (*)    Total Bilirubin 1.5 (*)    GFR, Estimated 23 (*)    All other components within normal limits  C DIFFICILE QUICK SCREEN W PCR REFLEX    URINALYSIS, ROUTINE W REFLEX MICROSCOPIC  MAGNESIUM  CBG MONITORING, ED    EKG EKG Interpretation  Date/Time:  Sunday December 03 2022 14:11:43 EST Ventricular Rate:  70 PR Interval:    QRS Duration: 166 QT Interval:  507 QTC Calculation: 548 R Axis:   37 Text Interpretation: Electronic ventricular pacemaker Confirmed by Lajean Saver [phone]) on 12/03/2022 2:39:30 PM  Radiology No results found.  Procedures .Critical Care  Performed by: Domenic Moras, PA-C Authorized by: Domenic Moras, PA-C   Critical care provider statement:    Critical care time (minutes):   30   Critical care was time spent personally by me on the following activities:  Development of treatment plan with patient or surrogate, discussions with consultants, evaluation of patient's response to treatment, examination of patient, ordering and review of laboratory studies, ordering and review of radiographic studies, ordering and performing treatments and interventions, pulse oximetry, re-evaluation of patient's condition and review of old charts     Medications Ordered in ED Medications  potassium chloride (KLOR-CON) packet 40 mEq (40 mEq Oral Given 12/03/22 1415)  potassium chloride 10 mEq in 100 mL IVPB (10 mEq Intravenous New Bag/Given 12/03/22 1412)  sodium chloride  0.9 % bolus 1,000 mL (1,000 mLs Intravenous New Bag/Given 12/03/22 1413)    ED Course/ Medical Decision Making/ A&P                           Medical Decision Making Amount and/or Complexity of Data Reviewed Labs: ordered.  Risk Prescription drug management.   BP 120/65 (BP Location: Left Arm)   Pulse 78   Temp 97.7 F (36.5 C) (Oral)   Resp 16   SpO2 96%   30:49 PM  89 year old female with significant history of A-fib currently on Eliquis, has pacemaker, hypertension, frequent falls, brought here via EMS for evaluation of diarrhea.  Patient is a poor historian, history obtained through daughter who is at bedside.  Patient previously was doing well and was living at an assisted facility.  She developed a urinary tract infection in November and was placed ciprofloxacin.  For the past several weeks patient has had persistent loose stools, decrease in appetite, nausea and vomiting and increased generalized weakness.  She was seen and evaluated by her doctor yesterday who voiced concerns that she may have C. difficile.  C. difficile test have been obtained and patient was preemptively started on oral vancomycin.  Patient brought here today due to concerns of dehydration as she is eating and drinking less.  No report of  any fever or dysuria patient denies any significant pain.  On exam this is an elderly female laying in bed appears to be in no acute discomfort.  She is overall looks dry with dry lips and mouth and poor skin turgor.  Lungs are clear to auscultation, heart with normal rate and rhythm, abdomen is soft and nontender with intact bowel sounds.  She is able to move all 4 extremities with poor effort.  Vital sign review with normal blood pressure. she is afebrile, no hypoxia.  Patient will benefit from IV fluid, workup initiated.  Since a C. difficile test have been obtained, I will defer obtaining another one.   2:00 PM Labs obtained independently viewed interpreted by me and are remarkable for hypokalemia with potassium of 2.2.  EKG without acute changes.  Will replenish with both p.o. and IV potassium supplementation.  Will consult for admission  2:56 PM -Labs ordered, independently viewed and interpreted by me.  Labs remarkable for K+ 2.2, supplementation given. Cr 2.02, IVF given.  Transaminitis showing AST 66, ALT 106 unsure of significant.  Plt is 80, which is a steady decrease from prior.  -The patient was maintained on a cardiac monitor.  I personally viewed and interpreted the cardiac monitored which showed an underlying rhythm of: paced rhythm -This patient presents to the ED for concern of weakness, this involves an extensive number of treatment options, and is a complaint that carries with it a high risk of complications and morbidity.  The differential diagnosis includes dehydration, persistent diarrhea, c.diff, uti, electrolytes derangement -Co morbidities that complicate the patient evaluation includes afib, CVA -Treatment includes IVF, potassium -Reevaluation of the patient after these medicines showed that the patient improved -PCP office notes or outside notes reviewed -Discussion with specialist [facility] Dr. Marylyn Ishihara who agrees to admit pt  -Escalation to admission/observation  considered: patients feels comfortable with admission          Final Clinical Impression(s) / ED Diagnoses Final diagnoses:  Dehydration  Diarrhea of presumed infectious origin  Hypokalemia  Thrombocytopenia ([place])    Rx / DC Orders ED Discharge Orders  None         Domenic Moras, PA-C 12/03/22 1504    Lajean Saver, MD 12/07/22 [phone]

## 2022-12-03 NOTE — H&P (Signed)
History and Physical    Patient: [name] [name] [id number]:[id number] DOB: 30-Mar-1932 DOA: 12/03/2022 DOS: the patient was seen and examined on 12/03/2022 PCP: Townsend Roger, MD  Patient coming from: Home  Chief Complaint:  Chief Complaint  Patient presents with   Diarrhea   HPI: [name] [name] is a 89 y.o. female with medical history significant of SSS s/p PPM, PAF, DVT. Presenting with diarrhea. She has had persistent diarrhea over the last several weeks. During this time she has have N/V and decreased appetite. She has lost at least 10lbs during this time. She saw her PCP yesterday about her problem. She was empirically started on PO vanc. She took her first dose last night, however, when she woke this morning she felt very weak and decided to come to the ED for evaluation.    Review of Systems: As mentioned in the history of present illness. All other systems reviewed and are negative. Past Medical History:  Diagnosis Date   Arthritis    CVA    Lacunar and cerebellar infarct   Depression    DVT (deep venous thrombosis) (HCC)    Details not available in the chart   Edema    Mitral valve regurgitation    a. echo 2009 mild to moderate MR   Paroxysmal atrial fibrillation (HCC)    Pneumonia    as a child   Presence of permanent cardiac pacemaker    Sinoatrial node dysfunction ([place])    Unspecified essential hypertension    Past Surgical History:  Procedure Laterality Date   AV NODE ABLATION N/A 02/18/2021   Procedure: AV NODE ABLATION;  Surgeon: Deboraha Sprang, MD;  Location: [facility];  Service: Cardiovascular;  Laterality: N/A;   CLOSED REDUCTION ULNAR SHAFT Right 11/15/2018   Procedure: CLOSED REDUCTION RIGHT DISTAL ULNAR FRACTURE;  Surgeon: Charlotte Crumb, MD;  Location: [facility];  Service: Orthopedics;  Laterality: Right;   OPEN REDUCTION INTERNAL FIXATION (ORIF) DISTAL RADIAL FRACTURE Right 11/15/2018   Procedure: OPEN REDUCTION INTERNAL FIXATION (ORIF)  RIGHT DISTAL RADIAL FRACTURE;  Surgeon: Charlotte Crumb, MD;  Location: [facility];  Service: Orthopedics;  Laterality: Right;   PACEMAKER INSERTION  09-14-2005   MDT EnRhythm dual chamber pacemaker implanted by Dr Lovena Le for SND   PPM GENERATOR CHANGEOUT N/A 05/07/2017   Procedure: [facility] Generator Changeout;  Surgeon: Deboraha Sprang, MD;  Location: [facility];  Service: Cardiovascular;  Laterality: N/A;   TONSILLECTOMY     Social History:  reports that she quit smoking about 38 years ago. Her smoking use included cigarettes. She has never used smokeless tobacco. She reports current alcohol use of about 14.0 standard drinks of alcohol per week. She reports that she does not use drugs.  Allergies  Allergen Reactions   Gluten Meal Diarrhea   Itraconazole Diarrhea and Itching   Lactose Intolerance (Gi) Diarrhea and Nausea Only   Statins Other (See Comments)    Myalgias    Family History  Problem Relation Age of Onset   Cancer Mother    Mitral valve prolapse Mother    Diabetes Father    Hypertension Father    Coronary artery disease Sister    Stroke Brother    Coronary artery disease Sister     Prior to Admission medications   Medication Sig Start Date End Date Taking? Authorizing Provider  apixaban (ELIQUIS) 2.5 MG TABS tablet Take 1 tablet (2.5 mg total) by mouth 2 (two) times daily. 04/10/22   Nishan,  Wallis Bamberg, MD  Cholecalciferol (VITAMIN D3) 2000 UNITS TABS Take 2,000 Units by mouth daily.    [provider]  colchicine 0.6 MG tablet Take 0.6 mg by mouth 2 (two) times daily. 06/21/21   [provider]  desonide (DESOWEN) 0.05 % ointment Apply 1 application topically 2 (two) times daily as needed (for irritation).     [provider]  diltiazem (CARDIZEM) 30 MG tablet TAKE 1 TABLET ONCE DAILY AS NEEDED FOR PALPATATION. 07/31/19   Baldwin Jamaica, PA-C  diltiazem (CARDIZEM) 60 MG tablet Take 60 mg by mouth 2 (two) times daily. 06/22/21   [provider]  donepezil (ARICEPT) 5 MG tablet Take 1 tablet (5 mg total) by mouth at bedtime. 11/02/22   Townsend Roger, MD  erythromycin ophthalmic ointment Place 1 application into the left eye at bedtime. 01/25/21   [provider]  ipratropium (ATROVENT) 0.03 % nasal spray Place 1 spray into both nostrils as needed. 05/10/21   [provider]  loratadine (CLARITIN) 10 MG tablet Take 1 tablet (10 mg total) by mouth daily. For allergies, congestion 12/03/17   Rai, Ripudeep K, MD  LORazepam (ATIVAN) 0.5 MG tablet Take 0.5 mg by mouth daily as needed for anxiety or sleep.    [provider]  memantine (NAMENDA) 10 MG tablet Take 10 mg by mouth 2 (two) times daily. 10/15/22   [provider]  mirtazapine (REMERON) 7.5 MG tablet Take 7.5 mg by mouth at bedtime.    [provider]  neomycin-polymyxin b-dexamethasone (MAXITROL) 3.5-10000-0.1 SUSP Place into the left eye. 09/05/22   [provider]  vitamin B-12 (CYANOCOBALAMIN) 1000 MCG tablet Take 1,000 mcg by mouth daily.    [provider]    Physical Exam: Vitals:   12/03/22 1319 12/03/22 1448  BP: 120/65 (!) 121/58  Pulse: 78 70  Resp: 16 16  Temp: 97.7 F (36.5 C)   TempSrc: Oral   SpO2: 96% 96%   General: 89 y.o. female resting in bed in NAD Eyes: PERRL, normal sclera ENMT: Nares patent w/o discharge, orophaynx clear, dentition normal, ears w/o discharge/lesions/ulcers Neck: Supple, trachea midline Cardiovascular: RRR, +S1, S2, no m/g/r, equal pulses throughout Respiratory: CTABL, no w/r/r, normal WOB GI: BS+, NDNT, no masses noted, no organomegaly noted, cachetic MSK: No e/c/c Neuro: A&O x 3, no focal deficits Psyc: Appropriate interaction and affect, calm/cooperative  Data Reviewed:  Results for orders placed or performed during the hospital encounter of 12/03/22 (from the past 24 hour(s))  CBC     Status: Abnormal   Collection Time: 12/03/22  1:30 PM  Result Value  Ref Range   WBC 5.4 4.0 - 10.5 K/uL   RBC 4.90 3.87 - 5.11 MIL/uL   Hemoglobin 15.0 12.0 - 15.0 g/dL   HCT 44.7 36.0 - 46.0 %   MCV 91.2 80.0 - 100.0 fL   MCH 30.6 26.0 - 34.0 pg   MCHC 33.6 30.0 - 36.0 g/dL   RDW 14.6 11.5 - 15.5 %   Platelets 80 (L) 150 - 400 K/uL   nRBC 0.4 (H) 0.0 - 0.2 %  Comprehensive metabolic panel     Status: Abnormal   Collection Time: 12/03/22  1:30 PM  Result Value Ref Range   Sodium 137 135 - 145 mmol/L   Potassium 2.2 (LL) 3.5 - 5.1 mmol/L   Chloride 101 98 - 111 mmol/L   CO2 23 22 - 32 mmol/L   Glucose, Bld 114 (H) 70 -  99 mg/dL   BUN 53 (H) 8 - 23 mg/dL   Creatinine, Ser 2.02 (H) 0.44 - 1.00 mg/dL   Calcium 8.1 (L) 8.9 - 10.3 mg/dL   Total Protein 6.4 (L) 6.5 - 8.1 g/dL   Albumin 3.5 3.5 - 5.0 g/dL   AST 66 (H) 15 - 41 U/L   ALT 106 (H) 0 - 44 U/L   Alkaline Phosphatase 109 38 - 126 U/L   Total Bilirubin 1.5 (H) 0.3 - 1.2 mg/dL   GFR, Estimated 23 (L) >60 mL/min   Anion gap 13 5 - 15  CBG monitoring, ED     Status: None   Collection Time: 12/03/22  2:40 PM  Result Value Ref Range   Glucose-Capillary 91 70 - 99 mg/dL   Assessment and Plan: Diarrhea     - admit to inpt, tele     - hold PO vanc for now     - check c diff, GI PCR     - fluids  Hypokalemia     - replace K+; check Mg2+  Thrombocytopenia     - denies history of liver disease, CA     - check peripheral smear     - trend  Elevated LFTs     - check hepatitis panel     - check complete US ab  AKI     - check renal US     - fluids     - watch nephrotoxins  SSS s/p PPM PAF Hx of DVT     - continue home regimen  Severe protein-calorie malnutrition     - dietitian consult  Advance Care Planning:   Code Status: DNR, confirmed through multiple lines of questioning.   Consults: None  Family Communication: W/ daughter at bedside  Severity of Illness: The appropriate patient status for this patient is INPATIENT. Inpatient status is judged to be reasonable and  necessary in order to provide the required intensity of service to ensure the patient's safety. The patient's presenting symptoms, physical exam findings, and initial radiographic and laboratory data in the context of their chronic comorbidities is felt to place them at high risk for further clinical deterioration. Furthermore, it is not anticipated that the patient will be medically stable for discharge from the hospital within 2 midnights of admission.   * I certify that at the point of admission it is my clinical judgment that the patient will require inpatient hospital care spanning beyond 2 midnights from the point of admission due to high intensity of service, high risk for further deterioration and high frequency of surveillance required.*  Author: Jonnie Finner, DO 12/03/2022 3:32 PM  For on call review [email or web address].

## 2022-12-04 ENCOUNTER — Inpatient Hospital Stay (HOSPITAL_COMMUNITY): Payer: Medicare Other

## 2022-12-04 DIAGNOSIS — R7989 Other specified abnormal findings of blood chemistry: Secondary | ICD-10-CM

## 2022-12-04 DIAGNOSIS — N179 Acute kidney failure, unspecified: Secondary | ICD-10-CM | POA: Diagnosis not present

## 2022-12-04 DIAGNOSIS — A084 Viral intestinal infection, unspecified: Secondary | ICD-10-CM | POA: Diagnosis not present

## 2022-12-04 DIAGNOSIS — D696 Thrombocytopenia, unspecified: Secondary | ICD-10-CM | POA: Diagnosis not present

## 2022-12-04 LAB — GASTROINTESTINAL PANEL BY PCR, STOOL (REPLACES STOOL CULTURE)

## 2022-12-04 LAB — COMPREHENSIVE METABOLIC PANEL
ALT: 95 U/L — ABNORMAL HIGH (ref 0–44)
AST: 78 U/L — ABNORMAL HIGH (ref 15–41)
Albumin: 2.7 g/dL — ABNORMAL LOW (ref 3.5–5.0)
Alkaline Phosphatase: 99 U/L (ref 38–126)
Anion gap: 9 (ref 5–15)
BUN: 38 mg/dL — ABNORMAL HIGH (ref 8–23)
CO2: 22 mmol/L (ref 22–32)
Calcium: 8.2 mg/dL — ABNORMAL LOW (ref 8.9–10.3)
Chloride: 109 mmol/L (ref 98–111)
Creatinine, Ser: 1.73 mg/dL — ABNORMAL HIGH (ref 0.44–1.00)
GFR, Estimated: 28 mL/min — ABNORMAL LOW (ref >60)
Glucose, Bld: 87 mg/dL (ref 70–99)
Potassium: 2.9 mmol/L — ABNORMAL LOW (ref 3.5–5.1)
Sodium: 140 mmol/L (ref 135–145)
Total Bilirubin: 1.9 mg/dL — ABNORMAL HIGH (ref 0.3–1.2)
Total Protein: 5.3 g/dL — ABNORMAL LOW (ref 6.5–8.1)

## 2022-12-04 LAB — DIFFERENTIAL
Abs Immature Granulocytes: 0.15 10*3/uL — ABNORMAL HIGH (ref 0.00–0.07)
Basophils Absolute: 0 10*3/uL (ref 0.0–0.1)
Basophils Relative: 1 %
Eosinophils Absolute: 0 10*3/uL (ref 0.0–0.5)
Eosinophils Relative: 0 %
Immature Granulocytes: 4 %
Lymphocytes Relative: 16 %
Lymphs Abs: 0.7 10*3/uL (ref 0.7–4.0)
Monocytes Absolute: 0.4 10*3/uL (ref 0.1–1.0)
Monocytes Relative: 9 %
Neutro Abs: 3 10*3/uL (ref 1.7–7.7)
Neutrophils Relative %: 70 %

## 2022-12-04 LAB — C DIFFICILE QUICK SCREEN W PCR REFLEX
C Diff antigen: NEGATIVE
C Diff interpretation: NOT DETECTED
C Diff toxin: NEGATIVE

## 2022-12-04 LAB — CBC
HCT: 39.9 % (ref 36.0–46.0)
Hemoglobin: 13.8 g/dL (ref 12.0–15.0)
MCH: 31.6 pg (ref 26.0–34.0)
MCHC: 34.6 g/dL (ref 30.0–36.0)
MCV: 91.3 fL (ref 80.0–100.0)
Platelets: 74 10*3/uL — ABNORMAL LOW (ref 150–400)
RBC: 4.37 MIL/uL (ref 3.87–5.11)
RDW: 14.8 % (ref 11.5–15.5)
WBC: 4.7 10*3/uL (ref 4.0–10.5)
nRBC: 0 % (ref 0.0–0.2)

## 2022-12-04 LAB — TECHNOLOGIST SMEAR REVIEW: Plt Morphology: DECREASED

## 2022-12-04 LAB — HEPATITIS PANEL, ACUTE
HCV Ab: NONREACTIVE
Hep A IgM: NONREACTIVE
Hep B C IgM: NONREACTIVE
Hepatitis B Surface Ag: NONREACTIVE

## 2022-12-04 MED ORDER — APIXABAN 2.5 MG PO TABS
2.5000 mg | ORAL_TABLET | Freq: Two times a day (BID) | ORAL | Status: DC
  Administered 2022-12-04 – 2022-12-05 (×2): 2.5 mg via ORAL
  Filled 2022-12-04 (×2): qty 1

## 2022-12-04 MED ORDER — BANATROL TF EN LIQD
60.0000 mL | Freq: Two times a day (BID) | ENTERAL | Status: DC
  Administered 2022-12-04 – 2022-12-06 (×6): 60 mL via ORAL
  Filled 2022-12-04 (×7): qty 60

## 2022-12-04 MED ORDER — ACETAMINOPHEN 650 MG RE SUPP: 650.0000 mg | Freq: Four times a day (QID) | RECTAL | Status: DC | PRN

## 2022-12-04 MED ORDER — MEMANTINE HCL 10 MG PO TABS
10.0000 mg | ORAL_TABLET | Freq: Two times a day (BID) | ORAL | Status: DC
  Administered 2022-12-04 – 2022-12-08 (×8): 10 mg via ORAL
  Filled 2022-12-04 (×9): qty 1

## 2022-12-04 MED ORDER — MIRTAZAPINE 15 MG PO TABS
7.5000 mg | ORAL_TABLET | Freq: Every day | ORAL | Status: DC
  Administered 2022-12-04 – 2022-12-07 (×4): 7.5 mg via ORAL
  Filled 2022-12-04 (×4): qty 1

## 2022-12-04 MED ORDER — ACETAMINOPHEN 325 MG PO TABS
650.0000 mg | ORAL_TABLET | Freq: Four times a day (QID) | ORAL | Status: DC | PRN
  Administered 2022-12-07: 650 mg via ORAL
  Filled 2022-12-04: qty 2

## 2022-12-04 MED ORDER — DILTIAZEM HCL 30 MG PO TABS
60.0000 mg | ORAL_TABLET | Freq: Two times a day (BID) | ORAL | Status: DC
  Administered 2022-12-04 – 2022-12-08 (×7): 60 mg via ORAL
  Filled 2022-12-04 (×7): qty 2

## 2022-12-04 MED ORDER — DONEPEZIL HCL 5 MG PO TABS
5.0000 mg | ORAL_TABLET | Freq: Every day | ORAL | Status: DC
  Administered 2022-12-04 – 2022-12-07 (×4): 5 mg via ORAL
  Filled 2022-12-04 (×4): qty 1

## 2022-12-04 MED ORDER — POTASSIUM CHLORIDE CRYS ER 20 MEQ PO TBCR
40.0000 meq | EXTENDED_RELEASE_TABLET | ORAL | Status: AC
  Administered 2022-12-04 (×2): 40 meq via ORAL
  Filled 2022-12-04 (×2): qty 2

## 2022-12-04 MED ORDER — POTASSIUM CHLORIDE CRYS ER 20 MEQ PO TBCR: 40.0000 meq | EXTENDED_RELEASE_TABLET | ORAL | Status: DC

## 2022-12-04 MED ORDER — LIP MEDEX EX OINT
TOPICAL_OINTMENT | CUTANEOUS | Status: DC | PRN
  Filled 2022-12-04: qty 7

## 2022-12-04 MED ORDER — ENSURE ENLIVE PO LIQD
237.0000 mL | Freq: Two times a day (BID) | ORAL | Status: DC
  Administered 2022-12-04 – 2022-12-08 (×7): 237 mL via ORAL

## 2022-12-04 MED ORDER — ADULT MULTIVITAMIN W/MINERALS CH
1.0000 | ORAL_TABLET | Freq: Every day | ORAL | Status: DC
  Administered 2022-12-04 – 2022-12-08 (×5): 1 via ORAL
  Filled 2022-12-04 (×5): qty 1

## 2022-12-04 NOTE — Progress Notes (Signed)
Initial Nutrition Assessment  DOCUMENTATION CODES:   Severe malnutrition in context of chronic illness, Underweight  INTERVENTION:   -Ensure Plus High Protein po BID, each supplement provides 350 kcal and 20 grams of protein.   -Multivitamin with minerals daily  -Banatrol BID, each provides 45 kcals, 2g protein and 5g soluble fiber to aid diarrhea.  NUTRITION DIAGNOSIS:   Severe Malnutrition related to chronic illness, diarrhea as evidenced by severe fat depletion, severe muscle depletion.  GOAL:   Patient will meet greater than or equal to 90% of their needs  MONITOR:   PO intake, Supplement acceptance, Labs, Weight trends, I & O's  REASON FOR ASSESSMENT:   Consult Assessment of nutrition requirement/status  ASSESSMENT:   89 y.o. female with medical history significant of SSS s/p PPM, PAF, DVT. Presenting with diarrhea.  Patient in room, seems a bit confused and kept talking about wanting to talk to the doctor. Kept repeating she would "listen to what he has to to say". Pt states she hasn't eaten anything. Asked if she follows a gluten-free diet and she states she does not. State she likes Ensure supplements but was confused if she should drink them at home. States she has had diarrhea for weeks.  Will order Ensure given malnutrition and Banatrol to help with diarrhea.  Pt reported 10 lbs of weight loss.  Per weight records, pt's weight has been trending down since 2021.   Medications: KLOR-CON  Labs reviewed: CBGs: 91 Low K   NUTRITION - FOCUSED PHYSICAL EXAM:  Flowsheet Row Most Recent Value  Orbital Region Severe depletion  Upper Arm Region Severe depletion  Thoracic and Lumbar Region Severe depletion  Buccal Region Severe depletion  Temple Region Severe depletion  Clavicle Bone Region Severe depletion  Clavicle and Acromion Bone Region Severe depletion  Scapular Bone Region Severe depletion  Dorsal Hand Severe depletion  Patellar Region Severe depletion   Anterior Thigh Region Severe depletion  Posterior Calf Region Severe depletion  Edema (RD Assessment) None  Hair Reviewed  Eyes Reviewed  Mouth Reviewed  [dry tongue and lips]  Skin Reviewed  Nails Reviewed       Diet Order:   Diet Order             Diet regular Room service appropriate? No; Fluid consistency: Thin  Diet effective now                   EDUCATION NEEDS:   No education needs have been identified at this time  Skin:  Skin Assessment: Reviewed RN Assessment  Last BM:  1/7  Height:   Ht Readings from Last 1 Encounters:  12/03/22 '5\' 5"'$  (1.651 m)    Weight:   Wt Readings from Last 1 Encounters:  12/03/22 48.7 kg    BMI:  Body mass index is 17.87 kg/m.  Estimated Nutritional Needs:   Kcal:  1700-1900  Protein:  70-85g  Fluid:  1.9L/day  Clayton Bibles, MS, RD, LDN Inpatient Clinical Dietitian Contact information available via Am[employer]

## 2022-12-04 NOTE — Progress Notes (Signed)
  Progress Note   Patient: [name] [name] RAQ:[id number] DOB: 25-Nov-1932 DOA: 12/03/2022     1 DOS: the patient was seen and examined on 12/04/2022   Brief hospital course: 89 year old woman presented with nausea, vomiting and diarrhea for 2 weeks.  Also noted to have significant weight loss over the last year secondary to poor appetite, food does not taste good.  Assessment and Plan: Infectious diarrhea/viral gastroenteritis secondary to astrovirus, with adult FTT and unintended weight loss secondary to poor oral intake secondary to food not tasting good Cdiff negative Supportive care.  IV fluids.   Thrombocytopenia Plts 80 > 74, smear unrevealing. Generally low, chronic, seen 2023, 2022, 2021, 2019, 2017, 2015 , CBC   Mixed elevation LFTs, transaminases and total bilirubin AST 66 > 78 ALT 106 > 95 T bili 1.5 > 1.9 Etiology unclear, would favor secondary to viral process. Hepatitis panel is negative. Abdominal ultrasound was unremarkable regard to the liver   AKI Suspect CKD stage IIIa Creatinine baseline perhaps ~1.1.  2.02 > 1.73 Persistent mild right-sided renal collecting system dilatation seen, can follow-up as an outpatient.  Hypokalemia, secondary to diarrhea K+ 2.9, will replete and check Mg2+ again tomorrow (was normal yesterday)  SSS s/p PPM PAF Hx of DVT Resume apixaban   Severe protein-calorie malnutrition Dietitian consult     Subjective:  Confused Feels ok  Physical Exam: Vitals:   12/04/22 0042 12/04/22 0459 12/04/22 0925 12/04/22 1449  BP: (!) 125/58 139/65 (!) 151/68 (!) 133/59  Pulse: 69 71 72 71  Resp: '14 18 18 17  '$ Temp: 97.7 F (36.5 C) 97.8 F (36.6 C) 97.9 F (36.6 C) 98 F (36.7 C)  TempSrc: Oral Oral Oral Oral  SpO2: 99% 98% 100% 96%  Weight:      Height:       Physical Exam Vitals reviewed.  Constitutional:      General: She is not in acute distress.    Appearance: She is not ill-appearing or toxic-appearing.   Cardiovascular:     Rate and Rhythm: Normal rate and regular rhythm.     Heart sounds: No murmur heard. Pulmonary:     Effort: Pulmonary effort is normal. No respiratory distress.     Breath sounds: No wheezing or rales.  Abdominal:     General: There is no distension.     Palpations: Abdomen is soft.  Neurological:     Mental Status: She is alert.  Psychiatric:        Mood and Affect: Mood normal.        Behavior: Behavior normal.     Data Reviewed: K+ 2.9, will replete and check Mg2+ again tomorrow (was normal yesterday) AST 66 > 78 ALT 106 > 95 T bili 1.9 Plts 80 > 74 Abdominal ultrasound pending EKG paced rhythm   Family Communication: daughter at bedside  Disposition: Status is: Inpatient Remains inpatient appropriate because: AKI, diarrhea  Planned Discharge Destination: Home    Time spent: 35 minutes  Author: Murray Hodgkins, MD 12/04/2022 6:20 PM  For on call review [email or web address].

## 2022-12-04 NOTE — Hospital Course (Addendum)
89 year old woman presented with nausea, vomiting and diarrhea for 2 weeks.  Also noted to have significant weight loss over the last year secondary to poor appetite, food does not taste good.

## 2022-12-05 DIAGNOSIS — N179 Acute kidney failure, unspecified: Secondary | ICD-10-CM | POA: Diagnosis not present

## 2022-12-05 DIAGNOSIS — D696 Thrombocytopenia, unspecified: Secondary | ICD-10-CM | POA: Diagnosis not present

## 2022-12-05 DIAGNOSIS — E43 Unspecified severe protein-calorie malnutrition: Secondary | ICD-10-CM

## 2022-12-05 DIAGNOSIS — A084 Viral intestinal infection, unspecified: Secondary | ICD-10-CM | POA: Diagnosis not present

## 2022-12-05 LAB — CBC
HCT: 39 % (ref 36.0–46.0)
Hemoglobin: 12.8 g/dL (ref 12.0–15.0)
MCH: 31.1 pg (ref 26.0–34.0)
MCHC: 32.8 g/dL (ref 30.0–36.0)
MCV: 94.7 fL (ref 80.0–100.0)
Platelets: 61 10*3/uL — ABNORMAL LOW (ref 150–400)
RBC: 4.12 MIL/uL (ref 3.87–5.11)
RDW: 14.8 % (ref 11.5–15.5)
WBC: 4.9 10*3/uL (ref 4.0–10.5)
nRBC: 0 % (ref 0.0–0.2)

## 2022-12-05 LAB — PHOSPHORUS: Phosphorus: 1.5 mg/dL — ABNORMAL LOW (ref 2.5–4.6)

## 2022-12-05 LAB — COMPREHENSIVE METABOLIC PANEL
ALT: 107 U/L — ABNORMAL HIGH (ref 0–44)
AST: 97 U/L — ABNORMAL HIGH (ref 15–41)
Albumin: 2.5 g/dL — ABNORMAL LOW (ref 3.5–5.0)
Alkaline Phosphatase: 103 U/L (ref 38–126)
Anion gap: 8 (ref 5–15)
BUN: 33 mg/dL — ABNORMAL HIGH (ref 8–23)
CO2: 21 mmol/L — ABNORMAL LOW (ref 22–32)
Calcium: 8.4 mg/dL — ABNORMAL LOW (ref 8.9–10.3)
Chloride: 113 mmol/L — ABNORMAL HIGH (ref 98–111)
Creatinine, Ser: 1.59 mg/dL — ABNORMAL HIGH (ref 0.44–1.00)
GFR, Estimated: 31 mL/min — ABNORMAL LOW (ref >60)
Glucose, Bld: 84 mg/dL (ref 70–99)
Potassium: 3.8 mmol/L (ref 3.5–5.1)
Sodium: 142 mmol/L (ref 135–145)
Total Bilirubin: 1.6 mg/dL — ABNORMAL HIGH (ref 0.3–1.2)
Total Protein: 5.1 g/dL — ABNORMAL LOW (ref 6.5–8.1)

## 2022-12-05 LAB — MAGNESIUM: Magnesium: 1.8 mg/dL (ref 1.7–2.4)

## 2022-12-05 MED ORDER — POTASSIUM PHOSPHATES 15 MMOLE/5ML IV SOLN
30.0000 mmol | Freq: Once | INTRAVENOUS | Status: AC
  Administered 2022-12-05: 30 mmol via INTRAVENOUS
  Filled 2022-12-05: qty 10

## 2022-12-05 MED ORDER — LORAZEPAM 2 MG/ML IJ SOLN
0.2500 mg | Freq: Once | INTRAMUSCULAR | Status: AC
  Administered 2022-12-06: 0.25 mg via INTRAVENOUS
  Filled 2022-12-05 (×2): qty 1

## 2022-12-05 NOTE — Progress Notes (Addendum)
  Progress Note   Patient: [name] [name] WCH:[id number] DOB: 01/15/1932 DOA: 12/03/2022     2 DOS: the patient was seen and examined on 12/05/2022   Brief hospital course: 89 year old woman presented with nausea, vomiting and diarrhea for 2 weeks.  Also noted to have significant weight loss over the last year secondary to poor appetite, food does not taste good.  Assessment and Plan: Infectious diarrhea/viral gastroenteritis secondary to astrovirus, with adult FTT and unintended weight loss secondary to poor oral intake secondary to food not tasting good Cdiff negative Continue supportive care, IV fluids.  AKI Suspect CKD stage IIIa Persistent mild right-sided renal collecting system dilatation seen, can follow-up as an outpatient. Creatinine baseline perhaps ~1.1.  2.02 > 1.73 > 1.59 Improving, continue IVF, check BMP in AM  Thrombocytopenia Plts 80 > 74 > 61, smear had acanthocytes. Will need outpatient hematology follow-up. Generally low, chronic, seen 2023, 2022, 2021, 2019, 2017, 2015 Etiology unclear may be related to current infection. Will stop apixaban.  Check CBC in AM.   Mixed elevation LFTs, transaminases and total bilirubin AST 66 > 78 > 97 ALT 106 > 95 > 107 T bili 1.5 > 1.9 > 1.6 Etiology unclear, would favor secondary to viral process. Hepatitis panel is negative. Abdominal ultrasound was unremarkable regard to the liver CMP in AM   Hypokalemia, secondary to diarrhea, resolved Hypophosphatemia   Phos low at 1.5, will replete BMP, magnesium and phosphorus tomorrow   SSS s/p PPM PAF Hx of DVT Will hold apixaban as above   Severe protein-calorie malnutrition Dietitian consult noted      Subjective:  Feels ok  Physical Exam: Vitals:   12/04/22 0925 12/04/22 1449 12/04/22 2015 12/05/22 0451  BP: (!) 151/68 (!) 133/59 (!) 141/67 126/68  Pulse: 72 71 75 73  Resp: '18 17 20 19  '$ Temp: 97.9 F (36.6 C) 98 F (36.7 C) 97.6 F (36.4 C) (!) 97.4 F  (36.3 C)  TempSrc: Oral Oral    SpO2: 100% 96% 98% 97%  Weight:      Height:       Physical Exam Vitals reviewed.  Constitutional:      General: She is not in acute distress.    Appearance: She is not ill-appearing or toxic-appearing.  Cardiovascular:     Rate and Rhythm: Normal rate and regular rhythm.     Heart sounds: No murmur heard. Pulmonary:     Effort: Pulmonary effort is normal. No respiratory distress.     Breath sounds: No wheezing, rhonchi or rales.  Neurological:     Mental Status: She is alert.  Psychiatric:        Mood and Affect: Mood normal.        Behavior: Behavior normal.     Data Reviewed: Creatinine down to 1.59 Phos low at 1.5, will replete AST up to 97 ALT up to 107 Tbili down to 1.6 Plts down to 61, will stop apixaban  Family Communication: daughter by telephone (lives in [place])  Disposition: Status is: Inpatient Remains inpatient appropriate because: AKI  Planned Discharge Destination: Home    Time spent: 20 minutes  Author: Murray Hodgkins, MD 12/05/2022 9:31 AM  For on call review [email or web address].

## 2022-12-05 NOTE — Progress Notes (Signed)
  Transition of Care [facility]) Screening Note   Patient Details  Name: [name] [name] Date of Birth: 1932-10-08   Transition of Care [facility]) CM/SW Contact:    Vassie Moselle, LCSW Phone Number: 12/05/2022, 3:14 PM    Transition of Care Department [facility]) has reviewed patient and no TOC needs have been identified at this time. We will continue to monitor patient advancement through interdisciplinary progression rounds. If new patient transition needs arise, please place a TOC consult.

## 2022-12-05 NOTE — Progress Notes (Signed)
Nutrition Note  RD consulted for nutritional assessment.  Initial assessment completed 1/8, pt was diagnosed with severe malnutrition at that time.  Ensure supplements have been ordered as well as Banatrol to help with diarrhea.   RD will continue to monitor.  Labs and medications reviewed.   If further nutrition issues arise, please consult RD.   Clayton Bibles, MS, RD, LDN Inpatient Clinical Dietitian Contact information available via Amion

## 2022-12-05 NOTE — Progress Notes (Signed)
       Overnight   NAME: [name] [name] MRN: [id number] DOB : 1931-12-18    Date of Service   12/05/2022   HPI/Events of Note    Notified by Nursing staff for recurrent confusion in nighttime hours. History of Rx for Namenda and Ativan although Ativan reported as not in use. Patient has episodes of restlessness and wandering behavior , removes IVs , constantly getting up out of bed and removing equipment.   Interventions/ Plan   Once dose of Ativan at reduced dose. Single loose restraint on opposite from IV/equipment side to be removed as soon as safe. Reorientation ongoing        Gershon Cull BSN MSNA MSN [place] Nurse Practitioner [place]

## 2022-12-06 DIAGNOSIS — A084 Viral intestinal infection, unspecified: Secondary | ICD-10-CM | POA: Diagnosis not present

## 2022-12-06 LAB — COMPREHENSIVE METABOLIC PANEL
ALT: 100 U/L — ABNORMAL HIGH (ref 0–44)
AST: 83 U/L — ABNORMAL HIGH (ref 15–41)
Albumin: 2.7 g/dL — ABNORMAL LOW (ref 3.5–5.0)
Alkaline Phosphatase: 103 U/L (ref 38–126)
Anion gap: 7 (ref 5–15)
BUN: 25 mg/dL — ABNORMAL HIGH (ref 8–23)
CO2: 21 mmol/L — ABNORMAL LOW (ref 22–32)
Calcium: 8.2 mg/dL — ABNORMAL LOW (ref 8.9–10.3)
Chloride: 111 mmol/L (ref 98–111)
Creatinine, Ser: 1.25 mg/dL — ABNORMAL HIGH (ref 0.44–1.00)
GFR, Estimated: 41 mL/min — ABNORMAL LOW (ref >60)
Glucose, Bld: 108 mg/dL — ABNORMAL HIGH (ref 70–99)
Potassium: 4.9 mmol/L (ref 3.5–5.1)
Sodium: 139 mmol/L (ref 135–145)
Total Bilirubin: 1.4 mg/dL — ABNORMAL HIGH (ref 0.3–1.2)
Total Protein: 5.1 g/dL — ABNORMAL LOW (ref 6.5–8.1)

## 2022-12-06 LAB — CBC
HCT: 35.5 % — ABNORMAL LOW (ref 36.0–46.0)
Hemoglobin: 12 g/dL (ref 12.0–15.0)
MCH: 32 pg (ref 26.0–34.0)
MCHC: 33.8 g/dL (ref 30.0–36.0)
MCV: 94.7 fL (ref 80.0–100.0)
Platelets: 69 10*3/uL — ABNORMAL LOW (ref 150–400)
RBC: 3.75 MIL/uL — ABNORMAL LOW (ref 3.87–5.11)
RDW: 14.9 % (ref 11.5–15.5)
WBC: 7.2 10*3/uL (ref 4.0–10.5)
nRBC: 0 % (ref 0.0–0.2)

## 2022-12-06 LAB — MAGNESIUM: Magnesium: 1.9 mg/dL (ref 1.7–2.4)

## 2022-12-06 LAB — PHOSPHORUS: Phosphorus: 3.1 mg/dL (ref 2.5–4.6)

## 2022-12-06 NOTE — Progress Notes (Signed)
[facility] Progress Note Patient: [name] [name] WLS:[id number] DOB: 1932/04/11 DOA: 12/03/2022  DOS: the patient was seen and examined on 12/06/2022  Brief hospital course: 89 year old woman presented with nausea, vomiting and diarrhea for 2 weeks.  Also noted to have significant weight loss over the last year secondary to poor appetite, food does not taste good.  Found to have astrovirus viral gastroenteritis.  Also has sundowning. Assessment and Plan: Viral gastroenteritis due to astrovirus. Adult failure to thrive. C. difficile negative. Diarrhea improving. Tolerating oral diet. Renal function improving.  Monitor with IV fluids.  AKI. Suspect CKD stage IIIa. Continue with IV fluid monitor.  Acute thrombocytopenia. No evidence of active bleeding. Outpatient oncology follow-up recommended. Currently anticoagulation on hold.  LFT elevation. Secondary to gastroenteritis. Currently improving with hydration. Monitor.  Hypokalemia. Hypophosphatemia Secondary to diarrhea. Improving.  History of PAF. PPM implant. No events on telemetry.  Continue discontinue.  Adult failure to thrive Underweight Body mass index is 17.87 kg/m. Nutrition Problem: Severe Malnutrition Etiology: chronic illness, diarrhea Nutrition Interventions: Interventions: Ensure Enlive (each supplement provides 350kcal and 20 grams of protein), MVI    Subjective: No nausea no vomiting.  Abdominal pain still present but improving.  Had 2 bowel movement so far.  No blood in the stool.  Physical Exam: General: in Mild distress, No Rash Cardiovascular: S1 and S2 Present, No Murmur Respiratory: Good respiratory effort, Bilateral Air entry present. No Crackles, No wheezes Abdomen: Bowel Sound present, No tenderness on exam Extremities: No edema Neuro: Alert and oriented x3, no new focal deficit  Data Reviewed: I have Reviewed nursing notes, Vitals, and Lab results. Since last encounter,  pertinent lab results CBC, BMP, magnesium and phosphorus   . I have ordered test including CBC, BMP magnesium, phosphorus  .   Disposition: Status is: Inpatient Remains inpatient appropriate because: Need for improvement and diarrhea  Place and maintain sequential compression device Start: 12/05/22 0935   Family Communication: No one at bedside Level of care: Med-Surg  Vitals:   12/05/22 1300 12/05/22 2009 12/06/22 1008 12/06/22 1229  BP: (!) 153/64 (!) 134/56 (!) 149/61 100/63  Pulse: 71 72 71 78  Resp: '20 20  18  '$ Temp: (!) 97.4 F (36.3 C) 98.9 F (37.2 C)  98.9 F (37.2 C)  TempSrc: Oral Oral  Oral  SpO2: 95% 94%  96%  Weight:      Height:         Author: Berle Mull, MD 12/06/2022 7:44 PM  Please look on [email or web address] to find out who is on call.

## 2022-12-07 DIAGNOSIS — A084 Viral intestinal infection, unspecified: Secondary | ICD-10-CM | POA: Diagnosis not present

## 2022-12-07 LAB — CBC
HCT: 37.4 % (ref 36.0–46.0)
Hemoglobin: 12.4 g/dL (ref 12.0–15.0)
MCH: 31.7 pg (ref 26.0–34.0)
MCHC: 33.2 g/dL (ref 30.0–36.0)
MCV: 95.7 fL (ref 80.0–100.0)
Platelets: 71 10*3/uL — ABNORMAL LOW (ref 150–400)
RBC: 3.91 MIL/uL (ref 3.87–5.11)
RDW: 15.3 % (ref 11.5–15.5)
WBC: 8.5 10*3/uL (ref 4.0–10.5)
nRBC: 0 % (ref 0.0–0.2)

## 2022-12-07 LAB — HEPATIC FUNCTION PANEL
ALT: 91 U/L — ABNORMAL HIGH (ref 0–44)
AST: 61 U/L — ABNORMAL HIGH (ref 15–41)
Albumin: 2.8 g/dL — ABNORMAL LOW (ref 3.5–5.0)
Alkaline Phosphatase: 88 U/L (ref 38–126)
Bilirubin, Direct: 0.5 mg/dL — ABNORMAL HIGH (ref 0.0–0.2)
Indirect Bilirubin: 1.1 mg/dL — ABNORMAL HIGH (ref 0.3–0.9)
Total Bilirubin: 1.6 mg/dL — ABNORMAL HIGH (ref 0.3–1.2)
Total Protein: 5.4 g/dL — ABNORMAL LOW (ref 6.5–8.1)

## 2022-12-07 LAB — BASIC METABOLIC PANEL
Anion gap: 7 (ref 5–15)
BUN: 25 mg/dL — ABNORMAL HIGH (ref 8–23)
CO2: 25 mmol/L (ref 22–32)
Calcium: 8.5 mg/dL — ABNORMAL LOW (ref 8.9–10.3)
Chloride: 108 mmol/L (ref 98–111)
Creatinine, Ser: 1.38 mg/dL — ABNORMAL HIGH (ref 0.44–1.00)
GFR, Estimated: 37 mL/min — ABNORMAL LOW (ref >60)
Glucose, Bld: 132 mg/dL — ABNORMAL HIGH (ref 70–99)
Potassium: 4.4 mmol/L (ref 3.5–5.1)
Sodium: 140 mmol/L (ref 135–145)

## 2022-12-07 LAB — MAGNESIUM: Magnesium: 2.1 mg/dL (ref 1.7–2.4)

## 2022-12-07 MED ORDER — ONDANSETRON HCL 4 MG/2ML IJ SOLN: 4.0000 mg | Freq: Four times a day (QID) | INTRAMUSCULAR | Status: DC | PRN

## 2022-12-07 MED ORDER — LOPERAMIDE HCL 2 MG PO CAPS: 2.0000 mg | ORAL_CAPSULE | ORAL | Status: DC | PRN

## 2022-12-07 NOTE — TOC Initial Note (Signed)
Transition of Care [facility]) - Initial/Assessment Note    Patient Details  Name: [name] [name] MRN: [id number] Date of Birth: [birth year]  Transition of Care [facility]) CM/SW Contact:    Vassie Moselle, LCSW Phone Number: 12/07/2022, 2:35 PM  Clinical Narrative:                 Pt oriented x 2. Spoke with pt's daughter via t/c. Pt's daughter lives in [place] and pt has no family who live in this area. Pt currently resides at [place]. Pt's daughter is agreeable for pt to discharge to SNF at [facility] prior to returning to [place].  [facility] agreeable to accept pt as long as pt does not need restraints/safety mittens, PRN for agitation, or safety sitter in 24 hour time period before DC.  FL-2 has been sent to [facility] for review.   Expected Discharge Plan: Skilled Nursing Facility Barriers to Discharge: No Barriers Identified   Patient Goals and CMS Choice Patient states their goals for this hospitalization and ongoing recovery are:: For pt to go to SNF CMS Medicare.gov Compare Post Acute Care list provided to:: Patient Represenative (must comment) Choice offered to / list presented to : Adult [place] ownership interest in [facility].provided to::  (NA)    Expected Discharge Plan and Services In-house Referral: Clinical Social Work Discharge Planning Services: CM Consult Post Acute Care Choice: [place] Living arrangements for the past 2 months: [place]                 DME Arranged: N/A DME Agency: NA                  Prior Living Arrangements/Services Living arrangements for the past 2 months: [place] Lives with:: Self Patient language and need for interpreter reviewed:: Yes Do you feel safe going back to the place where you live?: Yes      Need for Family Participation in Patient Care: Yes (Comment) Care giver support system in place?: Yes (comment) Current home services: DME Criminal  Activity/Legal Involvement Pertinent to Current Situation/Hospitalization: No - Comment as needed  Activities of Daily Living Home Assistive Devices/Equipment: Walker (specify type) ADL Screening (condition at time of admission) Patient's cognitive ability adequate to safely complete daily activities?: Yes Is the patient deaf or have difficulty hearing?: Yes Does the patient have difficulty seeing, even when wearing glasses/contacts?: No Does the patient have difficulty concentrating, remembering, or making decisions?: No Patient able to express need for assistance with ADLs?: Yes Does the patient have difficulty dressing or bathing?: No Independently performs ADLs?: Yes (appropriate for developmental age) In/Out Bed: Needs assistance Does the patient have difficulty walking or climbing stairs?: Yes Weakness of Legs: Both Weakness of Arms/Hands: None  Permission Sought/Granted Permission sought to share information with : Family Supports, Chartered certified accountant granted to share information with : Yes, Verbal Permission Granted     Permission granted to share info w AGENCY: [facility]        Emotional Assessment   Attitude/Demeanor/Rapport: Unable to Assess Affect (typically observed): Unable to Assess Orientation: : Oriented to Self, Oriented to Place Alcohol / Substance Use: Not Applicable Psych Involvement: No (comment)  Admission diagnosis:  Diarrhea of presumed infectious origin [R19.7] Diarrhea [R19.7] Dehydration [E86.0] Hypokalemia [E87.6] Thrombocytopenia (HCC) [D69.6] Patient Active Problem List   Diagnosis Date Noted   Viral gastroenteritis 12/04/2022   Vomiting and diarrhea 12/03/2022   History of DVT (deep vein thrombosis)  12/03/2022   Protein-calorie malnutrition, severe ([place]) 12/03/2022   Hypokalemia 12/03/2022   Thrombocytopenia ([place]) 12/03/2022   Elevated LFTs 12/03/2022   AKI (acute kidney injury) ([place]) 12/03/2022   Dysuria  11/02/2022   Memory loss 11/02/2022   Weight loss 11/02/2022   Urinary tract infection without hematuria 11/02/2022   Current mild episode of major depressive disorder without prior episode ([place]) 11/02/2022   Hyperglycemia 11/02/2022   Permanent atrial fibrillation ([name]) 01/05/2022   Heart block AV complete ([place]) 02/18/2021   Atrial fibrillation with RVR ([place]) 06/24/2018   Multiple rib fractures 06/24/2018   Leukocytosis 06/24/2018   Hyperkalemia 06/24/2018   Pneumothorax, closed, traumatic 06/24/2018   Atrophic vulva 05/20/2018   Candidal vulvovaginitis 45/80/9983   Lichen sclerosus et atrophicus 05/20/2018   Postmenopausal bleeding 05/20/2018   Palpitations 12/02/2017   TIA (transient ischemic attack) 12/02/2017   PAF (paroxysmal atrial fibrillation) ([place]) 06/29/2014   SVT (supraventricular tachycardia) 06/29/2014   Frequent falls 06/29/2014   Chronic anticoagulation 06/29/2014   Encounter for therapeutic drug monitoring 12/31/2013   Edema 10/21/2013   Hair thinning 09/26/2012   Mixed hyperlipidemia 02/15/2010   PACEMAKER-Medtronic 08/12/2009   MITRAL INSUFFICIENCY 02/17/2009   Essential hypertension 02/17/2009   Persistent atrial fibrillation ([place]) 02/17/2009   SSS (sick sinus syndrome) ([place]) 02/17/2009   DVT 02/17/2009   PCP:  Townsend Roger, MD Pharmacy:   [facility] [address] [place] - [address] [name] [address] Phone: [phone] Fax: [phone]  [place], [place] [address] Phone: [phone] Fax: [phone]     Social Determinants of Health (SDOH) Social History: SDOH Screenings   Food Insecurity: No Food Insecurity (12/03/2022)  Housing: Low Risk  (12/03/2022)  Transportation Needs: No Transportation Needs (12/03/2022)  Utilities: Not At Risk (12/03/2022)  Tobacco Use: Medium Risk (12/03/2022)   SDOH Interventions:      Readmission Risk Interventions    12/07/2022    2:33 PM 12/05/2022    3:14 PM  Readmission Risk Prevention Plan  Transportation Screening Complete Complete  PCP or Specialist Appt within 5-7 Days Complete Complete  Home Care Screening Complete Complete  Medication Review (RN CM) Complete Complete

## 2022-12-07 NOTE — Progress Notes (Signed)
[facility] Progress Note Patient: [name] [name] CWC:[id number] DOB: 1932-04-12 DOA: 12/03/2022  DOS: the patient was seen and examined on 12/07/2022  Brief hospital course: 89 year old woman presented with nausea, vomiting and diarrhea for 2 weeks.  Also noted to have significant weight loss over the last year secondary to poor appetite, food does not taste good.  Found to have astrovirus viral gastroenteritis.  Also has sundowning. Assessment and Plan: Viral gastroenteritis due to astrovirus. Adult failure to thrive. C. difficile negative. Diarrhea improving. Tolerating oral diet. Renal function improving.  Will stop IV fluid and monitor.  AKI. Suspect CKD stage IIIa. Renal function now improving.  Stop IV fluid and monitor.  Acute thrombocytopenia. No evidence of active bleeding. Outpatient oncology follow-up recommended. Currently anticoagulation on hold.  May require aspirin on discharge.  LFT elevation. Secondary to gastroenteritis. Currently improving with hydration. Monitor.  Hypokalemia. Hypophosphatemia Secondary to diarrhea. Improving.  History of PAF. PPM implant. No events on telemetry.  Eliquis discontinue.  Adult failure to thrive Underweight Body mass index is 17.87 kg/m. Nutrition Problem: Severe Malnutrition Etiology: chronic illness, diarrhea Nutrition Interventions: Interventions: Ensure Enlive (each supplement provides 350kcal and 20 grams of protein), MVI    Subjective: No nausea no vomiting no fever no chills.  No BM so far.  Passing gas.  Oral intake is minimal.  Physical Exam: General: in Mild distress, No Rash Cardiovascular: S1 and S2 Present, No Murmur Respiratory: Good respiratory effort, Bilateral Air entry present. No Crackles, No wheezes Abdomen: Bowel Sound present, No tenderness Extremities: No edema Neuro: Alert and oriented x3, no new focal deficit  Data Reviewed: I have Reviewed nursing notes, Vitals, and Lab  results. Since last encounter, pertinent lab results CBC and CMP   . I have ordered test including CBC and CMP  .    Disposition: Status is: Inpatient Remains inpatient appropriate because: Need for improvement in renal and placement at SNF  Place and maintain sequential compression device Start: 12/05/22 0935   Family Communication: No one at bedside discussed with daughter on the phone on 1/11. Level of care: Med-Surg  Vitals:   12/06/22 1229 12/06/22 2049 12/07/22 0630 12/07/22 1927  BP: 100/63 134/71 130/62 (!) 120/56  Pulse: 78 76 72 72  Resp: '18 18 16 18  '$ Temp: 98.9 F (37.2 C) (!) 97.1 F (36.2 C) (!) 97.4 F (36.3 C) 98.4 F (36.9 C)  TempSrc: Oral Axillary Axillary Axillary  SpO2: 96% 94% 98% 96%  Weight:      Height:         Author: Berle Mull, MD 12/07/2022 7:45 PM  Please look on [email or web address] to find out who is on call.

## 2022-12-07 NOTE — [place] FL2 (Signed)
[place] MEDICAID FL2 LEVEL OF CARE FORM     IDENTIFICATION  Patient Name: [name] [name] Birthdate: [birth year] Sex: female Admission Date (Current Location): 12/03/2022  [facility] and [place] Number:  Herbalist and Address:  [facility],  [place] [place], [place]      Provider Number: [id number]  Attending Physician Name and Address:  Lavina Hamman, MD  Relative Name and Phone Number:  Daughter, Cam Hai [phone]    Current Level of Care: Hospital Recommended Level of Care: [place] Prior Approval Number:    Date Approved/Denied:   PASRR Number: [id number]  Discharge Plan: SNF    Current Diagnoses: Patient Active Problem List   Diagnosis Date Noted   Viral gastroenteritis 12/04/2022   Vomiting and diarrhea 12/03/2022   History of DVT (deep vein thrombosis) 12/03/2022   Protein-calorie malnutrition, severe ([place]) 12/03/2022   Hypokalemia 12/03/2022   Thrombocytopenia ([place]) 12/03/2022   Elevated LFTs 12/03/2022   AKI (acute kidney injury) ([place]) 12/03/2022   Dysuria 11/02/2022   Memory loss 11/02/2022   Weight loss 11/02/2022   Urinary tract infection without hematuria 11/02/2022   Current mild episode of major depressive disorder without prior episode ([place]) 11/02/2022   Hyperglycemia 11/02/2022   Permanent atrial fibrillation ([place]) 01/05/2022   Heart block AV complete ([place]) 02/18/2021   Atrial fibrillation with RVR ([place]) 06/24/2018   Multiple rib fractures 06/24/2018   Leukocytosis 06/24/2018   Hyperkalemia 06/24/2018   Pneumothorax, closed, traumatic 06/24/2018   Atrophic vulva 05/20/2018   Candidal vulvovaginitis 76/16/0737   Lichen sclerosus et atrophicus 05/20/2018   Postmenopausal bleeding 05/20/2018   Palpitations 12/02/2017   TIA (transient ischemic attack) 12/02/2017   PAF (paroxysmal atrial fibrillation) ([place]) 06/29/2014   SVT (supraventricular tachycardia) 06/29/2014   Frequent  falls 06/29/2014   Chronic anticoagulation 06/29/2014   Encounter for therapeutic drug monitoring 12/31/2013   Edema 10/21/2013   Hair thinning 09/26/2012   Mixed hyperlipidemia 02/15/2010   PACEMAKER-Medtronic 08/12/2009   MITRAL INSUFFICIENCY 02/17/2009   Essential hypertension 02/17/2009   Persistent atrial fibrillation ([place]) 02/17/2009   SSS (sick sinus syndrome) ([place]) 02/17/2009   DVT 02/17/2009    Orientation RESPIRATION BLADDER Height & Weight     Self, Place  Normal Incontinent, External catheter Weight: 107 lb 5.8 oz (48.7 kg) Height:  '5\' 5"'$  (165.1 cm)  BEHAVIORAL SYMPTOMS/MOOD NEUROLOGICAL BOWEL NUTRITION STATUS      Continent Diet (regular)  AMBULATORY STATUS COMMUNICATION OF NEEDS Skin   Limited Assist Verbally Normal                       Personal Care Assistance Level of Assistance  Bathing, Feeding, Dressing Bathing Assistance: Limited assistance Feeding assistance: Limited assistance Dressing Assistance: Limited assistance     Functional Limitations Info  Sight, Hearing, Speech Sight Info: Impaired Hearing Info: Impaired Speech Info: Adequate    SPECIAL CARE FACTORS FREQUENCY  PT (By licensed PT), OT (By licensed OT)     PT Frequency: 5x/wk OT Frequency: 5x/wk            Contractures Contractures Info: Not present    Additional Factors Info  Code Status, Allergies Code Status Info: DNR Allergies Info: Gluten Meal, Itraconazole, Lactose Intolerance (Gi), Statins           Current Medications (12/07/2022):  This is the current hospital active medication list Current Facility-Administered Medications  Medication Dose Route Frequency Provider Last Rate Last Admin   acetaminophen (  TYLENOL) tablet 650 mg  650 mg Oral Q6H PRN Marylyn Ishihara, Tyrone A, DO   650 mg at 12/07/22 1144   Or   acetaminophen (TYLENOL) suppository 650 mg  650 mg Rectal Q6H PRN Marylyn Ishihara, Tyrone A, DO       diltiazem (CARDIZEM) tablet 60 mg  60 mg Oral BID Samuella Cota, MD    60 mg at 12/07/22 0930   donepezil (ARICEPT) tablet 5 mg  5 mg Oral QHS Samuella Cota, MD   5 mg at 12/06/22 2313   feeding supplement (ENSURE ENLIVE / ENSURE PLUS) liquid 237 mL  237 mL Oral BID BM Samuella Cota, MD   237 mL at 12/07/22 1421   lip balm (CARMEX) ointment   Topical PRN Samuella Cota, MD       loperamide (IMODIUM) capsule 2 mg  2 mg Oral Q4H PRN Lavina Hamman, MD       memantine [facility]) tablet 10 mg  10 mg Oral BID Samuella Cota, MD   10 mg at 12/07/22 0930   mirtazapine (REMERON) tablet 7.5 mg  7.5 mg Oral QHS Samuella Cota, MD   7.5 mg at 12/06/22 2313   multivitamin with minerals tablet 1 tablet  1 tablet Oral Daily Samuella Cota, MD   1 tablet at 12/07/22 0909   ondansetron (ZOFRAN) injection 4 mg  4 mg Intravenous Q6H PRN Lavina Hamman, MD         Discharge Medications: Please see discharge summary for a list of discharge medications.  Relevant Imaging Results:  Relevant Lab Results:   Additional Information SSN: [id number]  Vassie Moselle, LCSW

## 2022-12-07 NOTE — Evaluation (Signed)
Physical Therapy Evaluation Patient Details Name: [name] [name] MRN: [id number] DOB: 22-Aug-1932 Today's Date: 12/07/2022  History of Present Illness  89 year old woman presented with nausea, vomiting and diarrhea for 2 weeks.  Also noted to have significant weight loss over the last year secondary to poor appetite, food does not taste good.  Found to have astrovirus viral gastroenteritis.  Also has sundowning. PMH: CVA, DVT.  Clinical Impression  Pt admitted with above diagnosis. Per nurse tech, pt was able to walk short distances with a RW over the past couple days but this morning had significant difficulty with transferring recliner to bed. During PT evaluation, pt required min assist for bed mobility. She adamantly refused to attempt sit to stand, stating that she was unable. She agreed to lateral scoots at the edge of the bed and participated in seated BUE/LE exercises for strengthening. She was oriented to self and location, not to month nor year (pt stated it is November 1919). No family present to determine her baseline. At present she would need 24* assistance. SNF recommended if family unable to provide needed level of care.  Pt currently with functional limitations due to the deficits listed below (see PT Problem List). Pt will benefit from skilled PT to increase their independence and safety with mobility to allow discharge to the venue listed below.          Recommendations for follow up therapy are one component of a multi-disciplinary discharge planning process, led by the attending physician.  Recommendations may be updated based on patient status, additional functional criteria and insurance authorization.  Follow Up Recommendations Skilled nursing-short term rehab (<3 hours/day) Can patient physically be transported by private vehicle: No    Assistance Recommended at Discharge Frequent or constant Supervision/Assistance  Patient can return home with the following  A lot  of help with walking and/or transfers;A lot of help with bathing/dressing/bathroom;Assistance with cooking/housework;Assist for transportation;Direct supervision/assist for medications management;Help with stairs or ramp for entrance;Direct supervision/assist for financial management    Equipment Recommendations Rolling walker (2 wheels)  Recommendations for Other Services       Functional Status Assessment Patient has had a recent decline in their functional status and demonstrates the ability to make significant improvements in function in a reasonable and predictable amount of time.     Precautions / Restrictions Precautions Precautions: Fall Precaution Comments: pt denies h/o falls but is not a reliable historian Restrictions Weight Bearing Restrictions: No      Mobility  Bed Mobility Overal bed mobility: Needs Assistance Bed Mobility: Supine to Sit, Sit to Supine     Supine to sit: Min assist Sit to supine: Min assist   General bed mobility comments: min A to raise trunk, min A for BLEs into bed    Transfers Overall transfer level: Needs assistance                 General transfer comment: pt refused to attempt sit to stand, repeatedly stated she's not able to, pt agreed to 3 lateral scoots towards head of bed in sitting which she performed without physical assistance. Per NT, recliner to bed transfer was very labored this morning and pt required max assist. NT stated pt had been able to walk with a RW a few days ago.    Ambulation/Gait               General Gait Details: pt refused to attempt  Stairs  Wheelchair Mobility    Modified Rankin (Stroke Patients Only)       Balance Overall balance assessment: Needs assistance Sitting-balance support: Feet supported, No upper extremity supported Sitting balance-Leahy Scale: Fair                                       Pertinent Vitals/Pain Pain Assessment Pain  Assessment: PAINAD Faces Pain Scale: No hurt Breathing: normal Negative Vocalization: none Facial Expression: smiling or inexpressive Body Language: relaxed Consolability: no need to console PAINAD Score: 0    Home Living Family/patient expects to be discharged to:: Skilled nursing facility                   Additional Comments: pt reports she lived alone in an apartment with assistance from her daughter for groceries PTA    Prior Function Prior Level of Function : Patient poor historian/Family not available             Mobility Comments: pt reports she did not use an assistive device PTA ADLs Comments: daughter helped with groceries     Hand Dominance        Extremity/Trunk Assessment   Upper Extremity Assessment Upper Extremity Assessment: Generalized weakness    Lower Extremity Assessment Lower Extremity Assessment: RLE deficits/detail;LLE deficits/detail;Generalized weakness RLE Deficits / Details: knee ext 4/5, hip flexion +3/5 LLE Deficits / Details: knee ext 4/5, hip flexion +3/5       Communication   Communication: No difficulties  Cognition Arousal/Alertness: Awake/alert Behavior During Therapy: WFL for tasks assessed/performed Overall Cognitive Status: No family/caregiver present to determine baseline cognitive functioning                                 General Comments: oriented to self and knows she's in a hospital, stated year is "25" and month is "November"; able to follow commands        General Comments      Exercises General Exercises - Lower Extremity Ankle Circles/Pumps: AROM, Both, 10 reps, Seated Long Arc Quad: AROM, Both, 10 reps, Seated Hip Flexion/Marching: AROM, Both, 5 reps, Seated Shoulder Exercises Shoulder Flexion: AROM, Both, 5 reps, Seated   Assessment/Plan    PT Assessment Patient needs continued PT services  PT Problem List Decreased strength;Decreased mobility;Decreased activity  tolerance;Decreased balance;Decreased cognition       PT Treatment Interventions Gait training;Therapeutic activities;Therapeutic exercise;Patient/family education;Functional mobility training    PT Goals (Current goals can be found in the Care Plan section)  Acute Rehab PT Goals PT Goal Formulation: Patient unable to participate in goal setting Time For Goal Achievement: 12/21/22 Potential to Achieve Goals: Fair    Frequency Min 2X/week     Co-evaluation               AM-PAC PT "6 Clicks" Mobility  Outcome Measure Help needed turning from your back to your side while in a flat bed without using bedrails?: A Little Help needed moving from lying on your back to sitting on the side of a flat bed without using bedrails?: A Little Help needed moving to and from a bed to a chair (including a wheelchair)?: A Lot Help needed standing up from a chair using your arms (e.g., wheelchair or bedside chair)?: A Lot Help needed to walk in hospital room?: Total Help needed climbing 3-5 steps  with a railing? : Total 6 Click Score: 12    End of Session   Activity Tolerance: Patient limited by fatigue Patient left: in bed;with call bell/phone within reach;with bed alarm set Nurse Communication: Mobility status PT Visit Diagnosis: Difficulty in walking, not elsewhere classified (R26.2);Other abnormalities of gait and mobility (R26.89);Muscle weakness (generalized) (M62.81)    Time: 3010-4045 PT Time Calculation (min) (ACUTE ONLY): 20 min   Charges:   PT Evaluation $PT Eval Moderate Complexity: 1 Mod         Philomena Doheny PT 12/07/2022  Acute Rehabilitation Services  Office [phone]

## 2022-12-08 DIAGNOSIS — D649 Anemia, unspecified: Secondary | ICD-10-CM | POA: Diagnosis not present

## 2022-12-08 DIAGNOSIS — R278 Other lack of coordination: Secondary | ICD-10-CM | POA: Diagnosis not present

## 2022-12-08 DIAGNOSIS — D72819 Decreased white blood cell count, unspecified: Secondary | ICD-10-CM | POA: Diagnosis not present

## 2022-12-08 DIAGNOSIS — S2242XD Multiple fractures of ribs, left side, subsequent encounter for fracture with routine healing: Secondary | ICD-10-CM | POA: Diagnosis not present

## 2022-12-08 DIAGNOSIS — I442 Atrioventricular block, complete: Secondary | ICD-10-CM | POA: Diagnosis not present

## 2022-12-08 DIAGNOSIS — R41841 Cognitive communication deficit: Secondary | ICD-10-CM | POA: Diagnosis not present

## 2022-12-08 DIAGNOSIS — R1110 Vomiting, unspecified: Secondary | ICD-10-CM | POA: Diagnosis not present

## 2022-12-08 DIAGNOSIS — R627 Adult failure to thrive: Secondary | ICD-10-CM | POA: Diagnosis not present

## 2022-12-08 DIAGNOSIS — R2689 Other abnormalities of gait and mobility: Secondary | ICD-10-CM | POA: Diagnosis not present

## 2022-12-08 DIAGNOSIS — N179 Acute kidney failure, unspecified: Secondary | ICD-10-CM | POA: Diagnosis not present

## 2022-12-08 DIAGNOSIS — E569 Vitamin deficiency, unspecified: Secondary | ICD-10-CM | POA: Diagnosis not present

## 2022-12-08 DIAGNOSIS — I480 Paroxysmal atrial fibrillation: Secondary | ICD-10-CM | POA: Diagnosis not present

## 2022-12-08 DIAGNOSIS — J309 Allergic rhinitis, unspecified: Secondary | ICD-10-CM | POA: Diagnosis not present

## 2022-12-08 DIAGNOSIS — S51812D Laceration without foreign body of left forearm, subsequent encounter: Secondary | ICD-10-CM | POA: Diagnosis not present

## 2022-12-08 DIAGNOSIS — R410 Disorientation, unspecified: Secondary | ICD-10-CM | POA: Diagnosis not present

## 2022-12-08 DIAGNOSIS — A084 Viral intestinal infection, unspecified: Secondary | ICD-10-CM | POA: Diagnosis not present

## 2022-12-08 DIAGNOSIS — Z7401 Bed confinement status: Secondary | ICD-10-CM | POA: Diagnosis not present

## 2022-12-08 DIAGNOSIS — E876 Hypokalemia: Secondary | ICD-10-CM | POA: Diagnosis not present

## 2022-12-08 DIAGNOSIS — E43 Unspecified severe protein-calorie malnutrition: Secondary | ICD-10-CM | POA: Diagnosis not present

## 2022-12-08 DIAGNOSIS — I4821 Permanent atrial fibrillation: Secondary | ICD-10-CM | POA: Diagnosis not present

## 2022-12-08 DIAGNOSIS — Z862 Personal history of diseases of the blood and blood-forming organs and certain disorders involving the immune mechanism: Secondary | ICD-10-CM | POA: Diagnosis not present

## 2022-12-08 DIAGNOSIS — Z86718 Personal history of other venous thrombosis and embolism: Secondary | ICD-10-CM | POA: Diagnosis not present

## 2022-12-08 DIAGNOSIS — M6281 Muscle weakness (generalized): Secondary | ICD-10-CM | POA: Diagnosis not present

## 2022-12-08 DIAGNOSIS — R531 Weakness: Secondary | ICD-10-CM | POA: Diagnosis not present

## 2022-12-08 DIAGNOSIS — Z9181 History of falling: Secondary | ICD-10-CM | POA: Diagnosis not present

## 2022-12-08 DIAGNOSIS — D696 Thrombocytopenia, unspecified: Secondary | ICD-10-CM | POA: Diagnosis not present

## 2022-12-08 DIAGNOSIS — R413 Other amnesia: Secondary | ICD-10-CM | POA: Diagnosis not present

## 2022-12-08 LAB — HEPATIC FUNCTION PANEL
ALT: 84 U/L — ABNORMAL HIGH (ref 0–44)
AST: 55 U/L — ABNORMAL HIGH (ref 15–41)
Albumin: 3 g/dL — ABNORMAL LOW (ref 3.5–5.0)
Alkaline Phosphatase: 90 U/L (ref 38–126)
Bilirubin, Direct: 0.4 mg/dL — ABNORMAL HIGH (ref 0.0–0.2)
Indirect Bilirubin: 1 mg/dL — ABNORMAL HIGH (ref 0.3–0.9)
Total Bilirubin: 1.4 mg/dL — ABNORMAL HIGH (ref 0.3–1.2)
Total Protein: 5.6 g/dL — ABNORMAL LOW (ref 6.5–8.1)

## 2022-12-08 LAB — CBC
HCT: 38.9 % (ref 36.0–46.0)
Hemoglobin: 12.5 g/dL (ref 12.0–15.0)
MCH: 31.3 pg (ref 26.0–34.0)
MCHC: 32.1 g/dL (ref 30.0–36.0)
MCV: 97.5 fL (ref 80.0–100.0)
Platelets: 73 10*3/uL — ABNORMAL LOW (ref 150–400)
RBC: 3.99 MIL/uL (ref 3.87–5.11)
RDW: 15.9 % — ABNORMAL HIGH (ref 11.5–15.5)
WBC: 8.3 10*3/uL (ref 4.0–10.5)
nRBC: 0 % (ref 0.0–0.2)

## 2022-12-08 LAB — BASIC METABOLIC PANEL
Anion gap: 7 (ref 5–15)
BUN: 23 mg/dL (ref 8–23)
CO2: 28 mmol/L (ref 22–32)
Calcium: 8.6 mg/dL — ABNORMAL LOW (ref 8.9–10.3)
Chloride: 108 mmol/L (ref 98–111)
Creatinine, Ser: 1.24 mg/dL — ABNORMAL HIGH (ref 0.44–1.00)
GFR, Estimated: 42 mL/min — ABNORMAL LOW (ref >60)
Glucose, Bld: 115 mg/dL — ABNORMAL HIGH (ref 70–99)
Potassium: 3.9 mmol/L (ref 3.5–5.1)
Sodium: 143 mmol/L (ref 135–145)

## 2022-12-08 LAB — MAGNESIUM: Magnesium: 2 mg/dL (ref 1.7–2.4)

## 2022-12-08 MED ORDER — ADULT MULTIVITAMIN W/MINERALS CH: 1.0000 | ORAL_TABLET | Freq: Every day | ORAL | 0 refills | Status: AC

## 2022-12-08 MED ORDER — ASPIRIN 81 MG PO TBEC: 81.0000 mg | DELAYED_RELEASE_TABLET | Freq: Every day | ORAL | 0 refills | Status: AC

## 2022-12-08 MED ORDER — APIXABAN 2.5 MG PO TABS: 2.5000 mg | ORAL_TABLET | Freq: Two times a day (BID) | ORAL | 5 refills | Status: AC

## 2022-12-08 NOTE — Progress Notes (Signed)
Report called to [facility]

## 2022-12-08 NOTE — Discharge Summary (Signed)
Physician Discharge Summary   Patient: [name] [name] MRN: [id number] DOB: Sep 19, 1932  Admit date:     12/03/2022  Discharge date: 12/08/22  Discharge Physician: Berle Mull  PCP: Townsend Roger, MD  Recommendations at discharge: APIXABAN is on HOLD unti Platelet counts are above 100, pt will be on ASPIRIN 81 mc instead until then.  RECHECK CBC on 12/13/2022. If PLT count is >100, RESUME APIXABAN and STOP aspirin.  If PLT count is < 100, refer to hematology.  Follow up with PCP in 1 week   Follow-up Information     Nona Dell, Corene Cornea, MD. Schedule an appointment as soon as possible for a visit in 1 week(s).   Specialty: Internal Medicine Why: CBC and CMP on 12/13/2022 Contact information: [address] [place] [address] [phone]                Discharge Diagnoses: Principal Problem:   Viral gastroenteritis Active Problems:   SSS (sick sinus syndrome) (HCC)   PAF (paroxysmal atrial fibrillation) (HCC)   Vomiting and diarrhea   History of DVT (deep vein thrombosis)   Protein-calorie malnutrition, severe (HCC)   Hypokalemia   Thrombocytopenia (HCC)   Elevated LFTs   AKI (acute kidney injury) [facility])  Hospital Course: 89 year old woman presented with nausea, vomiting and diarrhea for 2 weeks.  Also noted to have significant weight loss over the last year secondary to poor appetite, food does not taste good.  Found to have astrovirus viral gastroenteritis.  Also has sundowning. Assessment and Plan Viral gastroenteritis due to astrovirus. Adult failure to thrive. C. difficile negative. Diarrhea improving. Tolerating oral diet. Renal function improving.    AKI. Suspect CKD stage IIIa. Renal function now improving.    Acute thrombocytopenia. No evidence of active bleeding. Outpatient hematology follow-up recommended. Currently anticoagulation on hold.     LFT elevation. Secondary to gastroenteritis. Currently improving with hydration. Monitor.    Hypokalemia. Hypophosphatemia Secondary to diarrhea. Improving.   History of PAF. PPM implant. No events on telemetry.   Eliquis is on hold with plans to resume once PLT count is more than 100.    Adult failure to thrive Underweight Body mass index is 17.87 kg/m.  Severe Malnutrition Etiology: chronic illness, diarrhea Nutrition Interventions: Interventions: Ensure Enlive (each supplement provides 350kcal and 20 grams of protein), MVI    Consultants:  none  Procedures performed:  none  DISCHARGE MEDICATION: Allergies as of 12/08/2022       Reactions   Gluten Meal Diarrhea   Itraconazole Diarrhea, Itching   Lactose Intolerance (gi) Diarrhea, Nausea Only   Statins Other (See Comments)   Myalgias        Medication List     STOP taking these medications    colchicine 0.6 MG tablet   desonide 0.05 % ointment Commonly known as: DESOWEN   erythromycin ophthalmic ointment   LACTATED RINGERS IV   LORazepam 0.5 MG tablet Commonly known as: ATIVAN   ondansetron 4 MG tablet Commonly known as: ZOFRAN   UNABLE TO FIND   vancomycin 250 MG capsule Commonly known as: VANCOCIN       TAKE these medications    apixaban 2.5 MG Tabs tablet Commonly known as: ELIQUIS Take 1 tablet (2.5 mg total) by mouth 2 (two) times daily. Start taking on: December 15, 2022 What changed: These instructions start on December 15, 2022. If you are unsure what to do until then, ask your doctor or other care provider.  aspirin EC 81 MG tablet Take 1 tablet (81 mg total) by mouth daily for 7 days. Swallow whole.   cetirizine 10 MG tablet Commonly known as: ZYRTEC Take 10 mg by mouth daily as needed (itching).   cyanocobalamin 1000 MCG tablet Commonly known as: VITAMIN B12 Take 1,000 mcg by mouth daily.   diltiazem 60 MG tablet Commonly known as: CARDIZEM Take 60 mg by mouth 2 (two) times daily. What changed: Another medication with the same name was removed. Continue  taking this medication, and follow the directions you see here.   donepezil 5 MG tablet Commonly known as: Aricept Take 1 tablet (5 mg total) by mouth at bedtime.   ipratropium 0.03 % nasal spray Commonly known as: ATROVENT Place 1 spray into both nostrils 3 (three) times daily.   lactose free nutrition Liqd Take 237 mLs by mouth in the morning and at bedtime.   loperamide 2 MG tablet Commonly known as: IMODIUM A-D Take 2-4 mg by mouth every 4 (four) hours as needed for diarrhea or loose stools. Give 4 mg by mouth after the first loose stool and then 2 mg every 4 hours as needed   loratadine 10 MG tablet Commonly known as: CLARITIN Take 1 tablet (10 mg total) by mouth daily. For allergies, congestion What changed: additional instructions   memantine 10 MG tablet Commonly known as: NAMENDA Take 10 mg by mouth 2 (two) times daily.   mirtazapine 7.5 MG tablet Commonly known as: REMERON Take 7.5 mg by mouth at bedtime.   multivitamin with minerals Tabs tablet Take 1 tablet by mouth daily. Start taking on: December 09, 2022   neomycin-polymyxin b-dexamethasone 3.5-10000-0.1 Susp Commonly known as: MAXITROL Place 1 drop into the left eye daily.   Vitamin D3 50 MCG (2000 UT) Tabs Take 2,000 Units by mouth daily.       Disposition: SNF Diet recommendation: Regular diet  Discharge Exam: Vitals:   12/06/22 2049 12/07/22 0630 12/07/22 1927 12/08/22 0522  BP: 134/71 130/62 (!) 120/56 (!) 142/72  Pulse: 76 72 72 71  Resp: '18 16 18 16  '$ Temp: (!) 97.1 F (36.2 C) (!) 97.4 F (36.3 C) 98.4 F (36.9 C) (!) 97.5 F (36.4 C)  TempSrc: Axillary Axillary Axillary Axillary  SpO2: 94% 98% 96% 94%  Weight:      Height:       General: Appear in no distress; no visible Abnormal Neck Mass Or lumps, Conjunctiva normal Cardiovascular: S1 and S2 Present, no Murmur, Respiratory: good respiratory effort, Bilateral Air entry present and CTA, no Crackles, no wheezes Abdomen: Bowel  Sound present, Non tender  Extremities: no Pedal edema Neurology: alert and oriented to time, place, and person [facility] Weights   12/03/22 2132  Weight: 48.7 kg   Condition at discharge: stable  The results of significant diagnostics from this hospitalization (including imaging, microbiology, ancillary and laboratory) are listed below for reference.   Imaging Studies: US Abdomen Complete  Result Date: 12/04/2022 CLINICAL DATA:  Elevated liver function tests.  Acute kidney injury. EXAM: ABDOMEN ULTRASOUND COMPLETE COMPARISON:  CT 07/01/2021.  Ultrasound 05/25/2021 FINDINGS: Gallbladder: Gallbladder is distended. There appears to be some wall thickening but no shadowing stones or pericholecystic fluid. Common bile duct: Diameter: 4 mm Liver: No focal lesion identified. Within normal limits in parenchymal echogenicity. Portal vein is patent on color Doppler imaging with normal direction of blood flow towards the liver. IVC: No abnormality visualized. Pancreas: Obscured by overlapping bowel gas and soft tissue. Spleen: Size  and appearance within normal limits. Right Kidney: Length: 8.9. Preserved echotexture. Mild collecting system dilatation. Upper pole anechoic structure seen measuring 3.0 cm consistent with known cysts. Left Kidney: Length: 9.2. Echogenicity within normal limits. No mass or hydronephrosis visualized. Abdominal aorta: Scattered atherosclerotic changes.  Nonaneurysmal. Other findings: No ascites. IMPRESSION: Persistent mild right-sided renal collecting system dilatation. Please correlate for known history and further workup when appropriate. Stable upper pole benign renal cysts. Distended gallbladder with wall thickening. Question some sludge but no shadowing stones. No ductal dilatation. Electronically Signed   By: Jill Side M.D.   On: 12/04/2022 10:47    Microbiology: Results for orders placed or performed during the hospital encounter of 12/03/22  C Difficile Quick Screen w PCR  reflex     Status: None   Collection Time: 12/04/22  1:28 AM   Specimen: STOOL  Result Value Ref Range Status   C Diff antigen NEGATIVE NEGATIVE Final   C Diff toxin NEGATIVE NEGATIVE Final   C Diff interpretation No C. difficile detected.  Final    Comment: Performed at [facility], [place] [address] [place], [place] [address]  Gastrointestinal Panel by PCR , Stool     Status: Abnormal   Collection Time: 12/04/22  1:28 AM   Specimen: STOOL  Result Value Ref Range Status   Campylobacter species NOT DETECTED NOT DETECTED Final   Plesimonas shigelloides NOT DETECTED NOT DETECTED Final   Salmonella species NOT DETECTED NOT DETECTED Final   Yersinia enterocolitica NOT DETECTED NOT DETECTED Final   Vibrio species NOT DETECTED NOT DETECTED Final   Vibrio cholerae NOT DETECTED NOT DETECTED Final   Enteroaggregative E coli (EAEC) NOT DETECTED NOT DETECTED Final   Enteropathogenic E coli (EPEC) NOT DETECTED NOT DETECTED Final   Enterotoxigenic E coli (ETEC) NOT DETECTED NOT DETECTED Final   Shiga like toxin producing E coli (STEC) NOT DETECTED NOT DETECTED Final   Shigella/Enteroinvasive E coli (EIEC) NOT DETECTED NOT DETECTED Final   Cryptosporidium NOT DETECTED NOT DETECTED Final   Cyclospora cayetanensis NOT DETECTED NOT DETECTED Final   Entamoeba histolytica NOT DETECTED NOT DETECTED Final   Giardia lamblia NOT DETECTED NOT DETECTED Final   Adenovirus F40/41 NOT DETECTED NOT DETECTED Final   Astrovirus DETECTED (A) NOT DETECTED Final   Norovirus GI/GII NOT DETECTED NOT DETECTED Final   Rotavirus A NOT DETECTED NOT DETECTED Final   Sapovirus (I, II, IV, and V) NOT DETECTED NOT DETECTED Final    Comment: Performed at [facility], [address] [place], [place] [address]   Labs: CBC: Recent Labs  Lab 12/04/22 0055 12/04/22 0636 12/05/22 0617 12/06/22 0602 12/07/22 0721 12/08/22 0538  WBC  --  4.7 4.9 7.2 8.5 8.3  NEUTROABS 3.0  --   --   --   --    --   HGB  --  13.8 12.8 12.0 12.4 12.5  HCT  --  39.9 39.0 35.5* 37.4 38.9  MCV  --  91.3 94.7 94.7 95.7 97.5  PLT  --  74* 61* 69* 71* 73*   Basic Metabolic Panel: Recent Labs  Lab 12/03/22 1330 12/04/22 0636 12/05/22 0617 12/06/22 0602 12/07/22 0721 12/08/22 0538  NA 137 140 142 139 140 143  K 2.2* 2.9* 3.8 4.9 4.4 3.9  CL 101 109 113* 111 108 108  CO2 23 22 21* 21* 25 28  GLUCOSE 114* 87 84 108* 132* 115*  BUN 53* 38* 33* 25* 25* 23  CREATININE 2.02*  1.73* 1.59* 1.25* 1.38* 1.24*  CALCIUM 8.1* 8.2* 8.4* 8.2* 8.5* 8.6*  MG 2.3  --  1.8 1.9 2.1 2.0  PHOS  --   --  1.5* 3.1  --   --    Liver Function Tests: Recent Labs  Lab 12/04/22 0636 12/05/22 0617 12/06/22 0602 12/07/22 0721 12/08/22 0538  AST 78* 97* 83* 61* 55*  ALT 95* 107* 100* 91* 84*  ALKPHOS 99 103 103 88 90  BILITOT 1.9* 1.6* 1.4* 1.6* 1.4*  PROT 5.3* 5.1* 5.1* 5.4* 5.6*  ALBUMIN 2.7* 2.5* 2.7* 2.8* 3.0*   CBG: Recent Labs  Lab 12/03/22 1440  GLUCAP 91    Discharge time spent: greater than 30 minutes.  Signed: Berle Mull, MD [facility]

## 2022-12-08 NOTE — TOC Transition Note (Signed)
Transition of Care [facility]) - CM/SW Discharge Note   Patient Details  Name: [name] [name] MRN: [id number] Date of Birth: [birth year]  Transition of Care [facility]) CM/SW Contact:  Vassie Moselle, [place] Phone Number: 12/08/2022, 10:44 AM   Clinical Narrative:    Pt is to transfer to [facility] SNF prior to returning to [place] at [facility]. CSW spoke with pt's daughter and confirmed plan for transfer. PT has no family in this area and will need PTAR for transportation. DC summary, DC orders, and Transfer Report have been sent to [facility] via the [facility]. Pt will be going to room 605. RN to call report to [phone]. PTAR will be arranged for transport after lunchtime per [place]'s request.    Final next level of care: Skilled Nursing Facility Barriers to Discharge: No Barriers Identified   Patient Goals and CMS Choice CMS Medicare.gov Compare Post Acute Care list provided to:: Patient Represenative (must comment) Choice offered to / list presented to : Adult Children  Discharge Placement     Existing PASRR number confirmed : 12/07/22          Patient chooses bed at: [facility] Patient to be transferred to facility by: [facility] Name of family member notified: Cam Hai Patient and family notified of of transfer: 12/08/22  Discharge Plan and Services Additional resources added to the After Visit Summary for   In-house Referral: Clinical Social Work Discharge Planning Services: CM Consult Post Acute Care Choice: [place]          DME Arranged: N/A DME Agency: NA                  Social Determinants of Health (SDOH) Interventions SDOH Screenings   Food Insecurity: No Food Insecurity (12/03/2022)  Housing: Low Risk  (12/03/2022)  Transportation Needs: No Transportation Needs (12/03/2022)  Utilities: Not At Risk (12/03/2022)  Tobacco Use: Medium Risk (12/03/2022)     Readmission Risk Interventions    12/07/2022    2:33 PM 12/05/2022    3:14 PM  Readmission  Risk Prevention Plan  Transportation Screening Complete Complete  PCP or Specialist Appt within 5-7 Days Complete Complete  Home Care Screening Complete Complete  Medication Review (RN CM) Complete Complete

## 2022-12-08 NOTE — Plan of Care (Signed)
  Problem: Education: °Goal: Knowledge of General Education information will improve °Description: Including pain rating scale, medication(s)/side effects and non-pharmacologic comfort measures °Outcome: Progressing °  °Problem: Health Behavior/Discharge Planning: °Goal: Ability to manage health-related needs will improve °Outcome: Progressing °  °Problem: Clinical Measurements: °Goal: Ability to maintain clinical measurements within normal limits will improve °Outcome: Progressing °Goal: Will remain free from infection °Outcome: Progressing °Goal: Diagnostic test results will improve °Outcome: Progressing °Goal: Respiratory complications will improve °Outcome: Progressing °Goal: Cardiovascular complication will be avoided °Outcome: Progressing °  °Problem: Activity: °Goal: Risk for activity intolerance will decrease °Outcome: Progressing °  °Problem: Nutrition: °Goal: Adequate nutrition will be maintained °Outcome: Progressing °  °Problem: Coping: °Goal: Level of anxiety will decrease °Outcome: Progressing °  °Problem: Elimination: °Goal: Will not experience complications related to bowel motility °Outcome: Progressing °Goal: Will not experience complications related to urinary retention °Outcome: Progressing °  °Problem: Pain Managment: °Goal: General experience of comfort will improve °Outcome: Progressing °  °Problem: Safety: °Goal: Ability to remain free from injury will improve °Outcome: Progressing °  °Problem: Skin Integrity: °Goal: Risk for impaired skin integrity will decrease °Outcome: Progressing °  °Problem: Safety: °Goal: Non-violent Restraint(s) °Outcome: Progressing °  °

## 2022-12-08 NOTE — Progress Notes (Signed)
A&Ox3 - to self, place, and situation. Intermittent forgetfulness/confusion. Able to verbalize physical needs. Urinary incontinence overnight.

## 2022-12-12 DIAGNOSIS — E43 Unspecified severe protein-calorie malnutrition: Secondary | ICD-10-CM | POA: Diagnosis not present

## 2022-12-12 DIAGNOSIS — A084 Viral intestinal infection, unspecified: Secondary | ICD-10-CM | POA: Diagnosis not present

## 2022-12-12 DIAGNOSIS — I4821 Permanent atrial fibrillation: Secondary | ICD-10-CM | POA: Diagnosis not present

## 2022-12-12 DIAGNOSIS — S51812D Laceration without foreign body of left forearm, subsequent encounter: Secondary | ICD-10-CM | POA: Diagnosis not present

## 2022-12-14 DIAGNOSIS — I480 Paroxysmal atrial fibrillation: Secondary | ICD-10-CM | POA: Diagnosis not present

## 2022-12-14 DIAGNOSIS — R531 Weakness: Secondary | ICD-10-CM | POA: Diagnosis not present

## 2022-12-14 DIAGNOSIS — R627 Adult failure to thrive: Secondary | ICD-10-CM | POA: Diagnosis not present

## 2022-12-14 DIAGNOSIS — D696 Thrombocytopenia, unspecified: Secondary | ICD-10-CM | POA: Diagnosis not present

## 2022-12-15 DIAGNOSIS — D649 Anemia, unspecified: Secondary | ICD-10-CM | POA: Diagnosis not present

## 2022-12-15 DIAGNOSIS — Z862 Personal history of diseases of the blood and blood-forming organs and certain disorders involving the immune mechanism: Secondary | ICD-10-CM | POA: Diagnosis not present

## 2022-12-15 DIAGNOSIS — D72819 Decreased white blood cell count, unspecified: Secondary | ICD-10-CM | POA: Diagnosis not present

## 2022-12-18 ENCOUNTER — Other Ambulatory Visit: Payer: Self-pay | Admitting: *Deleted

## 2022-12-18 NOTE — Patient Outreach (Signed)
[name] [name] resides in [place] skilled [facility]. Screening for potential [facility] care coordination services as benefit of insurance plan and Primary Care Provider.   [name] [name] lived in [place] prior.  Secure communication sent to [facility], [employer] social worker to make aware Probation officer is following for potential [facility] care coordination needs.   [name] Rolling, MSN, RN,BSN [place] Acute Care Coordinator [phone] (Direct dial)

## 2022-12-20 DIAGNOSIS — I4821 Permanent atrial fibrillation: Secondary | ICD-10-CM | POA: Diagnosis not present

## 2022-12-20 DIAGNOSIS — E43 Unspecified severe protein-calorie malnutrition: Secondary | ICD-10-CM | POA: Diagnosis not present

## 2022-12-20 DIAGNOSIS — S51812D Laceration without foreign body of left forearm, subsequent encounter: Secondary | ICD-10-CM | POA: Diagnosis not present

## 2022-12-20 DIAGNOSIS — A084 Viral intestinal infection, unspecified: Secondary | ICD-10-CM | POA: Diagnosis not present

## 2023-01-02 DIAGNOSIS — Z9181 History of falling: Secondary | ICD-10-CM | POA: Diagnosis not present

## 2023-01-02 DIAGNOSIS — M6281 Muscle weakness (generalized): Secondary | ICD-10-CM | POA: Diagnosis not present

## 2023-01-02 DIAGNOSIS — R278 Other lack of coordination: Secondary | ICD-10-CM | POA: Diagnosis not present

## 2023-01-02 DIAGNOSIS — R2689 Other abnormalities of gait and mobility: Secondary | ICD-10-CM | POA: Diagnosis not present

## 2023-01-02 DIAGNOSIS — R2681 Unsteadiness on feet: Secondary | ICD-10-CM | POA: Diagnosis not present

## 2023-01-03 DIAGNOSIS — R278 Other lack of coordination: Secondary | ICD-10-CM | POA: Diagnosis not present

## 2023-01-03 DIAGNOSIS — R2681 Unsteadiness on feet: Secondary | ICD-10-CM | POA: Diagnosis not present

## 2023-01-03 DIAGNOSIS — M6281 Muscle weakness (generalized): Secondary | ICD-10-CM | POA: Diagnosis not present

## 2023-01-03 DIAGNOSIS — R2689 Other abnormalities of gait and mobility: Secondary | ICD-10-CM | POA: Diagnosis not present

## 2023-01-03 DIAGNOSIS — Z9181 History of falling: Secondary | ICD-10-CM | POA: Diagnosis not present

## 2023-01-04 ENCOUNTER — Other Ambulatory Visit: Payer: Self-pay | Admitting: Internal Medicine

## 2023-01-04 ENCOUNTER — Encounter: Payer: Self-pay | Admitting: Internal Medicine

## 2023-01-04 ENCOUNTER — Ambulatory Visit: Payer: Medicare Other | Admitting: Internal Medicine

## 2023-01-04 VITALS — BP 112/60 | HR 93 | Temp 97.4°F | Resp 16 | Ht 66.0 in

## 2023-01-04 DIAGNOSIS — N183 Chronic kidney disease, stage 3 unspecified: Secondary | ICD-10-CM

## 2023-01-04 DIAGNOSIS — R2689 Other abnormalities of gait and mobility: Secondary | ICD-10-CM | POA: Diagnosis not present

## 2023-01-04 DIAGNOSIS — R278 Other lack of coordination: Secondary | ICD-10-CM | POA: Diagnosis not present

## 2023-01-04 DIAGNOSIS — R739 Hyperglycemia, unspecified: Secondary | ICD-10-CM

## 2023-01-04 DIAGNOSIS — R2681 Unsteadiness on feet: Secondary | ICD-10-CM | POA: Diagnosis not present

## 2023-01-04 DIAGNOSIS — R1110 Vomiting, unspecified: Secondary | ICD-10-CM

## 2023-01-04 DIAGNOSIS — Z9181 History of falling: Secondary | ICD-10-CM | POA: Diagnosis not present

## 2023-01-04 DIAGNOSIS — M6281 Muscle weakness (generalized): Secondary | ICD-10-CM | POA: Diagnosis not present

## 2023-01-04 DIAGNOSIS — R413 Other amnesia: Secondary | ICD-10-CM

## 2023-01-04 DIAGNOSIS — D696 Thrombocytopenia, unspecified: Secondary | ICD-10-CM

## 2023-01-04 DIAGNOSIS — R634 Abnormal weight loss: Secondary | ICD-10-CM

## 2023-01-04 MED ORDER — DONEPEZIL HCL 10 MG PO TABS: 10.0000 mg | ORAL_TABLET | Freq: Every day | ORAL | 3 refills | Status: AC

## 2023-01-04 MED ORDER — MIRTAZAPINE 15 MG PO TABS: 15.0000 mg | ORAL_TABLET | Freq: Every day | ORAL | 1 refills | Status: AC

## 2023-01-04 NOTE — Assessment & Plan Note (Signed)
I do not think this recent viral gastroenteritis has helped with her weight gain.  She is noted to have failure to thrive.  She is supposed to be on either glucerna or ensure but she is taking this once per day.  I looked at her medications and her mirtazapine is at 7.'5mg'$  qhs.  I want her to be on mirtazapine '15mg'$  qhs and we will send this in.  I want her to start taking her glucerna supplement 2-3 times per day.
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
## 2023-01-04 NOTE — Assessment & Plan Note (Signed)
Her granddaugther is not here today to discuss her memory.  I am going to recheck a UA and increase her aricept from '5mg'$  to '10mg'$  qhs and continue on namenda '10mg'$  BID.

## 2023-01-04 NOTE — Assessment & Plan Note (Signed)
She had thrombocytopenia related to her recent viral illness.  They held her eliquis and we will recheck her platelet counts today.

## 2023-01-04 NOTE — Assessment & Plan Note (Signed)
She is still having some episodes of diarrhea but I think she is recovering from this recent hospital admission with viral gastroentiritis.  I am going to repeat her labs today.

## 2023-01-04 NOTE — Assessment & Plan Note (Signed)
She had dehydration with acute on chronic renal failure in the hospital.  We will recheck her labs today.

## 2023-01-05 DIAGNOSIS — R278 Other lack of coordination: Secondary | ICD-10-CM | POA: Diagnosis not present

## 2023-01-05 DIAGNOSIS — M6281 Muscle weakness (generalized): Secondary | ICD-10-CM | POA: Diagnosis not present

## 2023-01-05 DIAGNOSIS — R2689 Other abnormalities of gait and mobility: Secondary | ICD-10-CM | POA: Diagnosis not present

## 2023-01-05 DIAGNOSIS — R2681 Unsteadiness on feet: Secondary | ICD-10-CM | POA: Diagnosis not present

## 2023-01-05 DIAGNOSIS — Z9181 History of falling: Secondary | ICD-10-CM | POA: Diagnosis not present

## 2023-01-05 LAB — COMPREHENSIVE METABOLIC PANEL
ALT: 42 IU/L — ABNORMAL HIGH (ref 0–32)
AST: 38 IU/L (ref 0–40)
Albumin/Globulin Ratio: 1.7 (ref 1.2–2.2)
Albumin: 3.4 g/dL — ABNORMAL LOW (ref 3.7–4.7)
Alkaline Phosphatase: 121 IU/L (ref 44–121)
BUN/Creatinine Ratio: 19 (ref 12–28)
BUN: 19 mg/dL (ref 8–27)
Bilirubin Total: 0.4 mg/dL (ref 0.0–1.2)
CO2: 21 mmol/L (ref 20–29)
Calcium: 8.4 mg/dL — ABNORMAL LOW (ref 8.7–10.3)
Chloride: 109 mmol/L — ABNORMAL HIGH (ref 96–106)
Creatinine, Ser: 1.02 mg/dL — ABNORMAL HIGH (ref 0.57–1.00)
Globulin, Total: 2 g/dL (ref 1.5–4.5)
Glucose: 131 mg/dL — ABNORMAL HIGH (ref 70–99)
Potassium: 4.4 mmol/L (ref 3.5–5.2)
Sodium: 144 mmol/L (ref 134–144)
Total Protein: 5.4 g/dL — ABNORMAL LOW (ref 6.0–8.5)
eGFR: 53 mL/min/{1.73_m2} — ABNORMAL LOW (ref >59)

## 2023-01-05 LAB — CBC WITH DIFFERENTIAL/PLATELET
Basophils Absolute: 0.1 10*3/uL (ref 0.0–0.2)
Basos: 1 %
EOS (ABSOLUTE): 0.2 10*3/uL (ref 0.0–0.4)
Eos: 4 %
Hematocrit: 33 % — ABNORMAL LOW (ref 34.0–46.6)
Hemoglobin: 10.7 g/dL — ABNORMAL LOW (ref 11.1–15.9)
Immature Grans (Abs): 0 10*3/uL (ref 0.0–0.1)
Immature Granulocytes: 1 %
Lymphocytes Absolute: 1.4 10*3/uL (ref 0.7–3.1)
Lymphs: 22 %
MCH: 31.8 pg (ref 26.6–33.0)
MCHC: 32.4 g/dL (ref 31.5–35.7)
MCV: 98 fL — ABNORMAL HIGH (ref 79–97)
Monocytes Absolute: 0.4 10*3/uL (ref 0.1–0.9)
Monocytes: 7 %
Neutrophils Absolute: 4.2 10*3/uL (ref 1.4–7.0)
Neutrophils: 65 %
Platelets: 153 10*3/uL (ref 150–450)
RBC: 3.36 x10E6/uL — ABNORMAL LOW (ref 3.77–5.28)
RDW: 16 % — ABNORMAL HIGH (ref 11.7–15.4)
WBC: 6.4 10*3/uL (ref 3.4–10.8)

## 2023-01-05 LAB — HGB A1C W/O EAG: Hgb A1c MFr Bld: 5.8 % — ABNORMAL HIGH (ref 4.8–5.6)

## 2023-01-09 DIAGNOSIS — M6281 Muscle weakness (generalized): Secondary | ICD-10-CM | POA: Diagnosis not present

## 2023-01-09 DIAGNOSIS — Z9181 History of falling: Secondary | ICD-10-CM | POA: Diagnosis not present

## 2023-01-09 DIAGNOSIS — R278 Other lack of coordination: Secondary | ICD-10-CM | POA: Diagnosis not present

## 2023-01-09 DIAGNOSIS — R2689 Other abnormalities of gait and mobility: Secondary | ICD-10-CM | POA: Diagnosis not present

## 2023-01-09 DIAGNOSIS — R2681 Unsteadiness on feet: Secondary | ICD-10-CM | POA: Diagnosis not present

## 2023-01-10 DIAGNOSIS — M6281 Muscle weakness (generalized): Secondary | ICD-10-CM | POA: Diagnosis not present

## 2023-01-10 DIAGNOSIS — R2689 Other abnormalities of gait and mobility: Secondary | ICD-10-CM | POA: Diagnosis not present

## 2023-01-10 DIAGNOSIS — R2681 Unsteadiness on feet: Secondary | ICD-10-CM | POA: Diagnosis not present

## 2023-01-10 DIAGNOSIS — R278 Other lack of coordination: Secondary | ICD-10-CM | POA: Diagnosis not present

## 2023-01-10 DIAGNOSIS — Z9181 History of falling: Secondary | ICD-10-CM | POA: Diagnosis not present

## 2023-01-11 DIAGNOSIS — R2681 Unsteadiness on feet: Secondary | ICD-10-CM | POA: Diagnosis not present

## 2023-01-11 DIAGNOSIS — R2689 Other abnormalities of gait and mobility: Secondary | ICD-10-CM | POA: Diagnosis not present

## 2023-01-11 DIAGNOSIS — M6281 Muscle weakness (generalized): Secondary | ICD-10-CM | POA: Diagnosis not present

## 2023-01-11 DIAGNOSIS — Z9181 History of falling: Secondary | ICD-10-CM | POA: Diagnosis not present

## 2023-01-11 DIAGNOSIS — R278 Other lack of coordination: Secondary | ICD-10-CM | POA: Diagnosis not present

## 2023-01-12 DIAGNOSIS — R2689 Other abnormalities of gait and mobility: Secondary | ICD-10-CM | POA: Diagnosis not present

## 2023-01-12 DIAGNOSIS — R278 Other lack of coordination: Secondary | ICD-10-CM | POA: Diagnosis not present

## 2023-01-12 DIAGNOSIS — Z9181 History of falling: Secondary | ICD-10-CM | POA: Diagnosis not present

## 2023-01-12 DIAGNOSIS — M6281 Muscle weakness (generalized): Secondary | ICD-10-CM | POA: Diagnosis not present

## 2023-01-12 DIAGNOSIS — R2681 Unsteadiness on feet: Secondary | ICD-10-CM | POA: Diagnosis not present

## 2023-01-15 DIAGNOSIS — R278 Other lack of coordination: Secondary | ICD-10-CM | POA: Diagnosis not present

## 2023-01-15 DIAGNOSIS — R2689 Other abnormalities of gait and mobility: Secondary | ICD-10-CM | POA: Diagnosis not present

## 2023-01-15 DIAGNOSIS — R2681 Unsteadiness on feet: Secondary | ICD-10-CM | POA: Diagnosis not present

## 2023-01-15 DIAGNOSIS — Z9181 History of falling: Secondary | ICD-10-CM | POA: Diagnosis not present

## 2023-01-15 DIAGNOSIS — M6281 Muscle weakness (generalized): Secondary | ICD-10-CM | POA: Diagnosis not present

## 2023-01-16 DIAGNOSIS — R2681 Unsteadiness on feet: Secondary | ICD-10-CM | POA: Diagnosis not present

## 2023-01-16 DIAGNOSIS — Z9181 History of falling: Secondary | ICD-10-CM | POA: Diagnosis not present

## 2023-01-16 DIAGNOSIS — R2689 Other abnormalities of gait and mobility: Secondary | ICD-10-CM | POA: Diagnosis not present

## 2023-01-16 DIAGNOSIS — R278 Other lack of coordination: Secondary | ICD-10-CM | POA: Diagnosis not present

## 2023-01-16 DIAGNOSIS — M6281 Muscle weakness (generalized): Secondary | ICD-10-CM | POA: Diagnosis not present

## 2023-01-17 DIAGNOSIS — M6281 Muscle weakness (generalized): Secondary | ICD-10-CM | POA: Diagnosis not present

## 2023-01-17 DIAGNOSIS — R278 Other lack of coordination: Secondary | ICD-10-CM | POA: Diagnosis not present

## 2023-01-17 DIAGNOSIS — Z9181 History of falling: Secondary | ICD-10-CM | POA: Diagnosis not present

## 2023-01-17 DIAGNOSIS — R2681 Unsteadiness on feet: Secondary | ICD-10-CM | POA: Diagnosis not present

## 2023-01-17 DIAGNOSIS — R2689 Other abnormalities of gait and mobility: Secondary | ICD-10-CM | POA: Diagnosis not present

## 2023-01-18 DIAGNOSIS — R2689 Other abnormalities of gait and mobility: Secondary | ICD-10-CM | POA: Diagnosis not present

## 2023-01-18 DIAGNOSIS — M6281 Muscle weakness (generalized): Secondary | ICD-10-CM | POA: Diagnosis not present

## 2023-01-18 DIAGNOSIS — R278 Other lack of coordination: Secondary | ICD-10-CM | POA: Diagnosis not present

## 2023-01-18 DIAGNOSIS — R2681 Unsteadiness on feet: Secondary | ICD-10-CM | POA: Diagnosis not present

## 2023-01-18 DIAGNOSIS — Z9181 History of falling: Secondary | ICD-10-CM | POA: Diagnosis not present

## 2023-01-19 DIAGNOSIS — R278 Other lack of coordination: Secondary | ICD-10-CM | POA: Diagnosis not present

## 2023-01-19 DIAGNOSIS — M6281 Muscle weakness (generalized): Secondary | ICD-10-CM | POA: Diagnosis not present

## 2023-01-19 DIAGNOSIS — R2681 Unsteadiness on feet: Secondary | ICD-10-CM | POA: Diagnosis not present

## 2023-01-19 DIAGNOSIS — Z9181 History of falling: Secondary | ICD-10-CM | POA: Diagnosis not present

## 2023-01-19 DIAGNOSIS — R2689 Other abnormalities of gait and mobility: Secondary | ICD-10-CM | POA: Diagnosis not present

## 2023-01-22 DIAGNOSIS — Z9181 History of falling: Secondary | ICD-10-CM | POA: Diagnosis not present

## 2023-01-22 DIAGNOSIS — H02105 Unspecified ectropion of left lower eyelid: Secondary | ICD-10-CM | POA: Diagnosis not present

## 2023-01-22 DIAGNOSIS — M6281 Muscle weakness (generalized): Secondary | ICD-10-CM | POA: Diagnosis not present

## 2023-01-22 DIAGNOSIS — H26493 Other secondary cataract, bilateral: Secondary | ICD-10-CM | POA: Diagnosis not present

## 2023-01-22 DIAGNOSIS — R2689 Other abnormalities of gait and mobility: Secondary | ICD-10-CM | POA: Diagnosis not present

## 2023-01-22 DIAGNOSIS — Z961 Presence of intraocular lens: Secondary | ICD-10-CM | POA: Diagnosis not present

## 2023-01-22 DIAGNOSIS — R278 Other lack of coordination: Secondary | ICD-10-CM | POA: Diagnosis not present

## 2023-01-22 DIAGNOSIS — R2681 Unsteadiness on feet: Secondary | ICD-10-CM | POA: Diagnosis not present

## 2023-01-23 DIAGNOSIS — R2689 Other abnormalities of gait and mobility: Secondary | ICD-10-CM | POA: Diagnosis not present

## 2023-01-23 DIAGNOSIS — R278 Other lack of coordination: Secondary | ICD-10-CM | POA: Diagnosis not present

## 2023-01-23 DIAGNOSIS — Z9181 History of falling: Secondary | ICD-10-CM | POA: Diagnosis not present

## 2023-01-23 DIAGNOSIS — R2681 Unsteadiness on feet: Secondary | ICD-10-CM | POA: Diagnosis not present

## 2023-01-23 DIAGNOSIS — M6281 Muscle weakness (generalized): Secondary | ICD-10-CM | POA: Diagnosis not present

## 2023-01-25 DIAGNOSIS — M6281 Muscle weakness (generalized): Secondary | ICD-10-CM | POA: Diagnosis not present

## 2023-01-25 DIAGNOSIS — Z9181 History of falling: Secondary | ICD-10-CM | POA: Diagnosis not present

## 2023-01-25 DIAGNOSIS — R2681 Unsteadiness on feet: Secondary | ICD-10-CM | POA: Diagnosis not present

## 2023-01-25 DIAGNOSIS — R278 Other lack of coordination: Secondary | ICD-10-CM | POA: Diagnosis not present

## 2023-01-25 DIAGNOSIS — R2689 Other abnormalities of gait and mobility: Secondary | ICD-10-CM | POA: Diagnosis not present

## 2023-02-01 ENCOUNTER — Ambulatory Visit: Payer: Medicare Other

## 2023-02-01 DIAGNOSIS — I495 Sick sinus syndrome: Secondary | ICD-10-CM | POA: Diagnosis not present

## 2023-02-01 LAB — CUP PACEART REMOTE DEVICE CHECK
Battery Remaining Longevity: 63 mo
Battery Voltage: 2.96 V
Brady Statistic AP VP Percent: 0 %
Brady Statistic AP VS Percent: 0 %
Brady Statistic AS VP Percent: 99.12 %
Brady Statistic AS VS Percent: 0.88 %
Brady Statistic RA Percent Paced: 0 %
Brady Statistic RV Percent Paced: 99.19 %
Date Time Interrogation Session: 20240307053708
Implantable Lead Connection Status: 753985
Implantable Lead Connection Status: 753985
Implantable Lead Implant Date: 20061019
Implantable Lead Implant Date: 20061019
Implantable Lead Location: 753859
Implantable Lead Location: 753860
Implantable Lead Model: 5076
Implantable Pulse Generator Implant Date: 20180611
Lead Channel Impedance Value: 247 Ohm
Lead Channel Impedance Value: 323 Ohm
Lead Channel Impedance Value: 380 Ohm
Lead Channel Impedance Value: 418 Ohm
Lead Channel Pacing Threshold Amplitude: 0.625 V
Lead Channel Pacing Threshold Amplitude: 1.125 V
Lead Channel Pacing Threshold Pulse Width: 0.4 ms
Lead Channel Pacing Threshold Pulse Width: 0.4 ms
Lead Channel Sensing Intrinsic Amplitude: 2.875 mV
Lead Channel Sensing Intrinsic Amplitude: 2.875 mV
Lead Channel Sensing Intrinsic Amplitude: 6.125 mV
Lead Channel Sensing Intrinsic Amplitude: 6.125 mV
Lead Channel Setting Pacing Amplitude: 2.5 V
Lead Channel Setting Pacing Pulse Width: 0.4 ms
Lead Channel Setting Sensing Sensitivity: 0.9 mV
Zone Setting Status: 755011
Zone Setting Status: 755011

## 2023-02-08 ENCOUNTER — Ambulatory Visit: Payer: Medicare Other

## 2023-02-13 ENCOUNTER — Ambulatory Visit: Payer: Medicare Other

## 2023-02-15 ENCOUNTER — Ambulatory Visit: Payer: Medicare Other

## 2023-03-06 NOTE — Progress Notes (Signed)
Remote pacemaker transmission.   

## 2023-03-08 ENCOUNTER — Ambulatory Visit: Payer: Medicare Other | Admitting: Internal Medicine

## 2023-04-01 ENCOUNTER — Other Ambulatory Visit: Payer: Self-pay | Admitting: Internal Medicine

## 2023-05-03 ENCOUNTER — Ambulatory Visit: Payer: Medicare Other

## 2023-05-26 ENCOUNTER — Other Ambulatory Visit: Payer: Self-pay | Admitting: Internal Medicine

## 2023-08-02 ENCOUNTER — Ambulatory Visit: Payer: Medicare Other

## 2023-11-01 ENCOUNTER — Ambulatory Visit: Payer: Medicare Other

## 2024-01-31 ENCOUNTER — Ambulatory Visit: Payer: Medicare Other

## 2024-02-26 DEATH — deceased

## 2024-05-01 ENCOUNTER — Ambulatory Visit: Payer: Medicare Other

## 2024-07-31 ENCOUNTER — Ambulatory Visit: Payer: Medicare Other
